# Patient Record
Sex: Male | Born: 1950 | Race: White | Hispanic: No | Marital: Married | Smoking: Never smoker
Health system: Southern US, Community
[De-identification: ages and names within clinical notes are randomized; demographics above are authoritative.]

## PROBLEM LIST (undated history)

## (undated) DIAGNOSIS — G64 Other disorders of peripheral nervous system: Secondary | ICD-10-CM

## (undated) DIAGNOSIS — D72819 Decreased white blood cell count, unspecified: Secondary | ICD-10-CM

## (undated) DIAGNOSIS — M79605 Pain in left leg: Secondary | ICD-10-CM

## (undated) DIAGNOSIS — D751 Secondary polycythemia: Secondary | ICD-10-CM

## (undated) DIAGNOSIS — M5431 Sciatica, right side: Secondary | ICD-10-CM

## (undated) DIAGNOSIS — K210 Gastro-esophageal reflux disease with esophagitis: Secondary | ICD-10-CM

## (undated) DIAGNOSIS — G5793 Unspecified mononeuropathy of bilateral lower limbs: Secondary | ICD-10-CM

## (undated) DIAGNOSIS — Z87442 Personal history of urinary calculi: Secondary | ICD-10-CM

## (undated) DIAGNOSIS — I10 Essential (primary) hypertension: Secondary | ICD-10-CM

## (undated) DIAGNOSIS — B1920 Unspecified viral hepatitis C without hepatic coma: Secondary | ICD-10-CM

## (undated) DIAGNOSIS — M549 Dorsalgia, unspecified: Secondary | ICD-10-CM

## (undated) DIAGNOSIS — M4120 Other idiopathic scoliosis, site unspecified: Secondary | ICD-10-CM

## (undated) DIAGNOSIS — G8929 Other chronic pain: Secondary | ICD-10-CM

## (undated) DIAGNOSIS — G4730 Sleep apnea, unspecified: Secondary | ICD-10-CM

## (undated) DIAGNOSIS — M5136 Other intervertebral disc degeneration, lumbar region: Secondary | ICD-10-CM

## (undated) DIAGNOSIS — M5432 Sciatica, left side: Secondary | ICD-10-CM

## (undated) DIAGNOSIS — M533 Sacrococcygeal disorders, not elsewhere classified: Secondary | ICD-10-CM

## (undated) DIAGNOSIS — L570 Actinic keratosis: Secondary | ICD-10-CM

## (undated) DIAGNOSIS — E299 Testicular dysfunction, unspecified: Secondary | ICD-10-CM

## (undated) DIAGNOSIS — F32A Depression, unspecified: Secondary | ICD-10-CM

## (undated) DIAGNOSIS — K219 Gastro-esophageal reflux disease without esophagitis: Secondary | ICD-10-CM

## (undated) DIAGNOSIS — IMO0002 Reserved for concepts with insufficient information to code with codable children: Secondary | ICD-10-CM

## (undated) DIAGNOSIS — N486 Induration penis plastica: Secondary | ICD-10-CM

## (undated) DIAGNOSIS — N401 Enlarged prostate with lower urinary tract symptoms: Secondary | ICD-10-CM

## (undated) DIAGNOSIS — F419 Anxiety disorder, unspecified: Secondary | ICD-10-CM

## (undated) DIAGNOSIS — K7460 Unspecified cirrhosis of liver: Secondary | ICD-10-CM

## (undated) DIAGNOSIS — M4800 Spinal stenosis, site unspecified: Secondary | ICD-10-CM

## (undated) DIAGNOSIS — N419 Inflammatory disease of prostate, unspecified: Secondary | ICD-10-CM

## (undated) DIAGNOSIS — M79604 Pain in right leg: Secondary | ICD-10-CM

## (undated) DIAGNOSIS — N138 Other obstructive and reflux uropathy: Secondary | ICD-10-CM

## (undated) DIAGNOSIS — M1990 Unspecified osteoarthritis, unspecified site: Secondary | ICD-10-CM

## (undated) DIAGNOSIS — M5030 Other cervical disc degeneration, unspecified cervical region: Secondary | ICD-10-CM

## (undated) DIAGNOSIS — M419 Scoliosis, unspecified: Secondary | ICD-10-CM

## (undated) DIAGNOSIS — Z8619 Personal history of other infectious and parasitic diseases: Secondary | ICD-10-CM

## (undated) HISTORY — PX: ELBOW SURGERY: SHX618

## (undated) HISTORY — DX: Benign prostatic hyperplasia with lower urinary tract symptoms: N40.1

## (undated) HISTORY — DX: Other cervical disc degeneration, unspecified cervical region: M50.30

## (undated) HISTORY — DX: Other chronic pain: G89.29

## (undated) HISTORY — DX: Actinic keratosis: L57.0

## (undated) HISTORY — DX: Gastro-esophageal reflux disease with esophagitis: K21.0

## (undated) HISTORY — DX: Other idiopathic scoliosis, site unspecified: M41.20

## (undated) HISTORY — DX: Unspecified cirrhosis of liver: K74.60

## (undated) HISTORY — DX: Induration penis plastica: N48.6

## (undated) HISTORY — DX: Gastro-esophageal reflux disease without esophagitis: K21.9

## (undated) HISTORY — DX: Spinal stenosis, site unspecified: M48.00

## (undated) HISTORY — DX: Testicular dysfunction, unspecified: E29.9

## (undated) HISTORY — DX: Dorsalgia, unspecified: M54.9

## (undated) HISTORY — DX: Sacrococcygeal disorders, not elsewhere classified: M53.3

## (undated) HISTORY — DX: Inflammatory disease of prostate, unspecified: N41.9

## (undated) HISTORY — DX: Unspecified osteoarthritis, unspecified site: M19.90

## (undated) HISTORY — PX: HERNIA REPAIR: SHX51

## (undated) HISTORY — PX: OTHER SURGICAL HISTORY: SHX169

## (undated) HISTORY — DX: Other obstructive and reflux uropathy: N13.8

## (undated) HISTORY — DX: Unspecified viral hepatitis C without hepatic coma: B19.20

## (undated) HISTORY — DX: Secondary polycythemia: D75.1

## (undated) HISTORY — DX: Essential (primary) hypertension: I10

## (undated) HISTORY — PX: CERVICAL SPINE SURGERY: SHX589

## (undated) HISTORY — DX: Reserved for concepts with insufficient information to code with codable children: IMO0002

## (undated) HISTORY — DX: Other disorders of peripheral nervous system: G64

## (undated) HISTORY — DX: Other intervertebral disc degeneration, lumbar region: M51.36

---
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
## 1968-06-09 HISTORY — PX: FACIAL RECONSTRUCTION SURGERY: SHX631

## 1977-06-09 HISTORY — PX: SHOULDER SURGERY: SHX246

## 1981-06-09 HISTORY — PX: SHOULDER SURGERY: SHX246

## 1982-06-09 HISTORY — PX: FOOT SURGERY: SHX648

## 1987-06-10 HISTORY — PX: CATARACT EXTRACTION: SUR2

## 1997-06-09 HISTORY — PX: SEPTOPLASTY: SUR1290

## 1999-06-10 HISTORY — PX: OTHER SURGICAL HISTORY: SHX169

## 1999-06-10 HISTORY — PX: NISSEN FUNDOPLICATION: SHX2091

## 2000-08-25 ENCOUNTER — Encounter: Payer: Self-pay | Admitting: Neurological Surgery

## 2000-08-25 ENCOUNTER — Observation Stay (HOSPITAL_COMMUNITY): Admission: RE | Admit: 2000-08-25 | Discharge: 2000-08-25 | Payer: Self-pay | Admitting: Neurological Surgery

## 2000-09-14 ENCOUNTER — Encounter: Payer: Self-pay | Admitting: Neurological Surgery

## 2000-09-14 ENCOUNTER — Encounter: Admission: RE | Admit: 2000-09-14 | Discharge: 2000-09-14 | Payer: Self-pay | Admitting: Neurological Surgery

## 2000-11-11 ENCOUNTER — Ambulatory Visit (HOSPITAL_COMMUNITY): Admission: RE | Admit: 2000-11-11 | Discharge: 2000-11-11 | Payer: Self-pay | Admitting: Neurological Surgery

## 2000-11-11 ENCOUNTER — Encounter: Payer: Self-pay | Admitting: Neurological Surgery

## 2002-06-09 HISTORY — PX: SKIN SURGERY: SHX2413

## 2002-12-01 HISTORY — PX: NOSE SURGERY: SHX723

## 2003-01-24 ENCOUNTER — Encounter: Payer: Self-pay | Admitting: Neurological Surgery

## 2003-01-24 ENCOUNTER — Inpatient Hospital Stay (HOSPITAL_COMMUNITY): Admission: RE | Admit: 2003-01-24 | Discharge: 2003-01-26 | Payer: Self-pay | Admitting: Neurological Surgery

## 2004-03-11 ENCOUNTER — Ambulatory Visit: Payer: Self-pay | Admitting: Unknown Physician Specialty

## 2004-03-11 HISTORY — PX: COLONOSCOPY WITH ESOPHAGOGASTRODUODENOSCOPY (EGD): SHX5779

## 2004-03-26 ENCOUNTER — Ambulatory Visit: Payer: Self-pay | Admitting: Pain Medicine

## 2004-04-23 ENCOUNTER — Ambulatory Visit: Payer: Self-pay | Admitting: Pain Medicine

## 2004-05-21 ENCOUNTER — Ambulatory Visit: Payer: Self-pay | Admitting: Pain Medicine

## 2004-05-29 ENCOUNTER — Ambulatory Visit: Payer: Self-pay | Admitting: Pain Medicine

## 2004-08-01 ENCOUNTER — Ambulatory Visit: Payer: Self-pay | Admitting: Pain Medicine

## 2004-08-03 ENCOUNTER — Emergency Department: Payer: Self-pay | Admitting: Emergency Medicine

## 2004-08-04 ENCOUNTER — Emergency Department: Payer: Self-pay | Admitting: Emergency Medicine

## 2004-08-29 ENCOUNTER — Ambulatory Visit: Payer: Self-pay | Admitting: Pain Medicine

## 2004-09-26 ENCOUNTER — Ambulatory Visit: Payer: Self-pay | Admitting: Pain Medicine

## 2004-10-10 ENCOUNTER — Ambulatory Visit: Payer: Self-pay | Admitting: Internal Medicine

## 2004-10-24 ENCOUNTER — Ambulatory Visit: Payer: Self-pay | Admitting: Pain Medicine

## 2004-11-07 ENCOUNTER — Ambulatory Visit: Payer: Self-pay | Admitting: Internal Medicine

## 2004-11-21 ENCOUNTER — Ambulatory Visit: Payer: Self-pay | Admitting: Pain Medicine

## 2004-11-29 ENCOUNTER — Ambulatory Visit: Payer: Self-pay | Admitting: Unknown Physician Specialty

## 2004-12-05 ENCOUNTER — Ambulatory Visit: Payer: Self-pay | Admitting: Internal Medicine

## 2004-12-19 ENCOUNTER — Ambulatory Visit: Payer: Self-pay | Admitting: Pain Medicine

## 2005-01-02 ENCOUNTER — Ambulatory Visit: Payer: Self-pay | Admitting: Gastroenterology

## 2005-01-30 ENCOUNTER — Ambulatory Visit: Payer: Self-pay | Admitting: Gastroenterology

## 2005-02-20 ENCOUNTER — Ambulatory Visit: Payer: Self-pay | Admitting: Pain Medicine

## 2005-02-27 ENCOUNTER — Ambulatory Visit: Payer: Self-pay | Admitting: Gastroenterology

## 2005-03-20 ENCOUNTER — Ambulatory Visit: Payer: Self-pay | Admitting: Pain Medicine

## 2005-04-03 ENCOUNTER — Ambulatory Visit: Payer: Self-pay | Admitting: Gastroenterology

## 2005-04-17 ENCOUNTER — Ambulatory Visit: Payer: Self-pay | Admitting: Obstetrics and Gynecology

## 2005-04-24 ENCOUNTER — Ambulatory Visit: Payer: Self-pay | Admitting: Pain Medicine

## 2005-05-08 ENCOUNTER — Ambulatory Visit: Payer: Self-pay | Admitting: Gastroenterology

## 2005-05-29 ENCOUNTER — Ambulatory Visit: Payer: Self-pay | Admitting: Pain Medicine

## 2005-05-29 ENCOUNTER — Ambulatory Visit: Payer: Self-pay | Admitting: Gastroenterology

## 2005-07-17 ENCOUNTER — Ambulatory Visit: Payer: Self-pay | Admitting: Gastroenterology

## 2005-08-07 ENCOUNTER — Ambulatory Visit: Payer: Self-pay | Admitting: Gastroenterology

## 2005-09-18 ENCOUNTER — Ambulatory Visit: Payer: Self-pay | Admitting: Gastroenterology

## 2005-11-20 ENCOUNTER — Ambulatory Visit: Payer: Self-pay | Admitting: Gastroenterology

## 2005-12-03 ENCOUNTER — Ambulatory Visit: Payer: Self-pay | Admitting: Pain Medicine

## 2006-01-01 ENCOUNTER — Ambulatory Visit: Payer: Self-pay | Admitting: Pain Medicine

## 2006-01-27 ENCOUNTER — Ambulatory Visit: Payer: Self-pay | Admitting: Pain Medicine

## 2006-02-23 ENCOUNTER — Ambulatory Visit: Payer: Self-pay | Admitting: Pain Medicine

## 2006-03-31 ENCOUNTER — Ambulatory Visit: Payer: Self-pay | Admitting: Pain Medicine

## 2006-05-06 ENCOUNTER — Ambulatory Visit (HOSPITAL_COMMUNITY): Admission: RE | Admit: 2006-05-06 | Discharge: 2006-05-06 | Payer: Self-pay | Admitting: Gastroenterology

## 2006-05-07 ENCOUNTER — Ambulatory Visit: Payer: Self-pay | Admitting: Pain Medicine

## 2006-06-05 ENCOUNTER — Ambulatory Visit: Payer: Self-pay | Admitting: Psychiatry

## 2006-06-25 ENCOUNTER — Ambulatory Visit: Payer: Self-pay | Admitting: Pain Medicine

## 2006-07-23 ENCOUNTER — Ambulatory Visit: Payer: Self-pay | Admitting: Pain Medicine

## 2006-07-30 ENCOUNTER — Ambulatory Visit: Payer: Self-pay | Admitting: Gastroenterology

## 2006-08-20 ENCOUNTER — Ambulatory Visit: Payer: Self-pay | Admitting: Pain Medicine

## 2006-08-24 ENCOUNTER — Ambulatory Visit (HOSPITAL_COMMUNITY): Admission: RE | Admit: 2006-08-24 | Discharge: 2006-08-24 | Payer: Self-pay | Admitting: Gastroenterology

## 2006-09-03 ENCOUNTER — Ambulatory Visit: Payer: Self-pay | Admitting: Gastroenterology

## 2006-09-22 ENCOUNTER — Ambulatory Visit: Payer: Self-pay | Admitting: Pain Medicine

## 2006-10-29 ENCOUNTER — Ambulatory Visit: Payer: Self-pay | Admitting: Pain Medicine

## 2006-11-12 ENCOUNTER — Ambulatory Visit: Payer: Self-pay | Admitting: Gastroenterology

## 2006-11-19 ENCOUNTER — Ambulatory Visit: Payer: Self-pay | Admitting: Pain Medicine

## 2006-12-22 ENCOUNTER — Ambulatory Visit: Payer: Self-pay | Admitting: Pain Medicine

## 2007-01-19 ENCOUNTER — Ambulatory Visit: Payer: Self-pay | Admitting: Pain Medicine

## 2007-02-10 ENCOUNTER — Ambulatory Visit: Payer: Self-pay | Admitting: Pain Medicine

## 2007-03-23 ENCOUNTER — Ambulatory Visit: Payer: Self-pay | Admitting: Pain Medicine

## 2007-04-22 ENCOUNTER — Ambulatory Visit: Payer: Self-pay | Admitting: Pain Medicine

## 2007-05-27 ENCOUNTER — Ambulatory Visit: Payer: Self-pay | Admitting: Pain Medicine

## 2007-05-27 ENCOUNTER — Ambulatory Visit: Payer: Self-pay | Admitting: Gastroenterology

## 2007-06-29 ENCOUNTER — Ambulatory Visit: Payer: Self-pay | Admitting: Pain Medicine

## 2007-07-27 ENCOUNTER — Ambulatory Visit: Payer: Self-pay | Admitting: Pain Medicine

## 2007-08-26 ENCOUNTER — Ambulatory Visit: Payer: Self-pay | Admitting: Pain Medicine

## 2007-09-09 ENCOUNTER — Ambulatory Visit: Payer: Self-pay | Admitting: Pain Medicine

## 2007-10-14 ENCOUNTER — Ambulatory Visit: Payer: Self-pay | Admitting: Pain Medicine

## 2007-11-09 ENCOUNTER — Ambulatory Visit: Payer: Self-pay | Admitting: Pain Medicine

## 2007-12-07 ENCOUNTER — Ambulatory Visit: Payer: Self-pay | Admitting: Pain Medicine

## 2008-01-03 ENCOUNTER — Ambulatory Visit (HOSPITAL_COMMUNITY): Admission: RE | Admit: 2008-01-03 | Discharge: 2008-01-03 | Payer: Self-pay | Admitting: Gastroenterology

## 2008-01-06 ENCOUNTER — Ambulatory Visit: Payer: Self-pay | Admitting: Pain Medicine

## 2008-01-06 ENCOUNTER — Ambulatory Visit: Payer: Self-pay | Admitting: Gastroenterology

## 2008-02-08 ENCOUNTER — Ambulatory Visit: Payer: Self-pay | Admitting: Pain Medicine

## 2008-02-23 ENCOUNTER — Ambulatory Visit: Payer: Self-pay | Admitting: Gastroenterology

## 2008-03-07 ENCOUNTER — Ambulatory Visit: Payer: Self-pay | Admitting: Pain Medicine

## 2008-04-06 ENCOUNTER — Ambulatory Visit: Payer: Self-pay | Admitting: Pain Medicine

## 2008-05-09 ENCOUNTER — Ambulatory Visit: Payer: Self-pay | Admitting: Pain Medicine

## 2008-06-09 ENCOUNTER — Ambulatory Visit: Payer: Self-pay | Admitting: Internal Medicine

## 2008-06-13 ENCOUNTER — Ambulatory Visit: Payer: Self-pay | Admitting: Pain Medicine

## 2008-07-06 ENCOUNTER — Ambulatory Visit: Payer: Self-pay | Admitting: Pain Medicine

## 2008-07-10 ENCOUNTER — Ambulatory Visit: Payer: Self-pay | Admitting: Internal Medicine

## 2008-08-01 ENCOUNTER — Ambulatory Visit: Payer: Self-pay | Admitting: Internal Medicine

## 2008-08-01 ENCOUNTER — Ambulatory Visit: Payer: Self-pay | Admitting: Pain Medicine

## 2008-08-07 ENCOUNTER — Ambulatory Visit: Payer: Self-pay | Admitting: Internal Medicine

## 2008-09-05 ENCOUNTER — Ambulatory Visit: Payer: Self-pay | Admitting: Pain Medicine

## 2008-09-07 ENCOUNTER — Ambulatory Visit: Payer: Self-pay | Admitting: Internal Medicine

## 2008-10-05 ENCOUNTER — Ambulatory Visit: Payer: Self-pay | Admitting: Pain Medicine

## 2008-10-07 ENCOUNTER — Ambulatory Visit: Payer: Self-pay | Admitting: Internal Medicine

## 2008-11-02 ENCOUNTER — Ambulatory Visit: Payer: Self-pay | Admitting: Pain Medicine

## 2008-11-07 ENCOUNTER — Ambulatory Visit: Payer: Self-pay | Admitting: Internal Medicine

## 2008-11-09 ENCOUNTER — Ambulatory Visit: Payer: Self-pay | Admitting: Pain Medicine

## 2008-11-15 ENCOUNTER — Ambulatory Visit (HOSPITAL_COMMUNITY): Admission: RE | Admit: 2008-11-15 | Discharge: 2008-11-15 | Payer: Self-pay | Admitting: Gastroenterology

## 2008-11-28 ENCOUNTER — Ambulatory Visit: Payer: Self-pay | Admitting: Pain Medicine

## 2008-12-05 ENCOUNTER — Ambulatory Visit: Payer: Self-pay | Admitting: Gastroenterology

## 2008-12-07 ENCOUNTER — Ambulatory Visit: Payer: Self-pay | Admitting: Internal Medicine

## 2008-12-26 ENCOUNTER — Ambulatory Visit: Payer: Self-pay | Admitting: Pain Medicine

## 2009-01-30 ENCOUNTER — Ambulatory Visit: Payer: Self-pay | Admitting: Pain Medicine

## 2009-01-30 ENCOUNTER — Ambulatory Visit: Payer: Self-pay | Admitting: Internal Medicine

## 2009-02-07 ENCOUNTER — Ambulatory Visit: Payer: Self-pay | Admitting: Internal Medicine

## 2009-03-01 ENCOUNTER — Ambulatory Visit: Payer: Self-pay | Admitting: Pain Medicine

## 2009-03-28 ENCOUNTER — Ambulatory Visit: Payer: Self-pay | Admitting: Pain Medicine

## 2009-04-26 ENCOUNTER — Ambulatory Visit: Payer: Self-pay | Admitting: Pain Medicine

## 2009-05-15 ENCOUNTER — Ambulatory Visit: Payer: Self-pay | Admitting: Internal Medicine

## 2009-05-29 ENCOUNTER — Ambulatory Visit: Payer: Self-pay | Admitting: Pain Medicine

## 2009-06-09 ENCOUNTER — Ambulatory Visit: Payer: Self-pay | Admitting: Internal Medicine

## 2009-06-28 ENCOUNTER — Ambulatory Visit: Payer: Self-pay | Admitting: Pain Medicine

## 2009-07-10 ENCOUNTER — Ambulatory Visit: Payer: Self-pay | Admitting: Internal Medicine

## 2009-07-16 ENCOUNTER — Ambulatory Visit: Payer: Self-pay | Admitting: Internal Medicine

## 2009-07-26 ENCOUNTER — Ambulatory Visit: Payer: Self-pay | Admitting: Pain Medicine

## 2009-08-07 ENCOUNTER — Ambulatory Visit: Payer: Self-pay | Admitting: Internal Medicine

## 2009-08-23 ENCOUNTER — Ambulatory Visit: Payer: Self-pay | Admitting: Pain Medicine

## 2009-09-20 ENCOUNTER — Ambulatory Visit: Payer: Self-pay | Admitting: Pain Medicine

## 2009-10-18 ENCOUNTER — Ambulatory Visit: Payer: Self-pay | Admitting: Pain Medicine

## 2009-11-20 ENCOUNTER — Ambulatory Visit: Payer: Self-pay | Admitting: Pain Medicine

## 2009-11-22 ENCOUNTER — Ambulatory Visit (HOSPITAL_COMMUNITY): Admission: RE | Admit: 2009-11-22 | Discharge: 2009-11-22 | Payer: Self-pay | Admitting: Gastroenterology

## 2009-12-20 ENCOUNTER — Ambulatory Visit: Payer: Self-pay | Admitting: Pain Medicine

## 2010-01-17 ENCOUNTER — Ambulatory Visit: Payer: Self-pay | Admitting: Pain Medicine

## 2010-02-18 ENCOUNTER — Ambulatory Visit: Payer: Self-pay | Admitting: Unknown Physician Specialty

## 2010-02-19 ENCOUNTER — Other Ambulatory Visit: Payer: Self-pay | Admitting: Unknown Physician Specialty

## 2010-02-19 ENCOUNTER — Ambulatory Visit: Payer: Self-pay | Admitting: Pain Medicine

## 2010-02-20 ENCOUNTER — Ambulatory Visit: Payer: Self-pay | Admitting: Unknown Physician Specialty

## 2010-03-04 ENCOUNTER — Ambulatory Visit: Payer: Self-pay | Admitting: Pain Medicine

## 2010-03-09 ENCOUNTER — Ambulatory Visit: Payer: Self-pay | Admitting: Internal Medicine

## 2010-03-25 ENCOUNTER — Ambulatory Visit: Payer: Self-pay | Admitting: Internal Medicine

## 2010-04-09 ENCOUNTER — Ambulatory Visit: Payer: Self-pay | Admitting: Internal Medicine

## 2010-04-16 ENCOUNTER — Ambulatory Visit: Payer: Self-pay | Admitting: Pain Medicine

## 2010-05-16 ENCOUNTER — Ambulatory Visit: Payer: Self-pay | Admitting: Pain Medicine

## 2010-06-18 ENCOUNTER — Ambulatory Visit: Payer: Self-pay | Admitting: Pain Medicine

## 2010-07-16 ENCOUNTER — Ambulatory Visit: Payer: Self-pay | Admitting: Pain Medicine

## 2010-07-22 ENCOUNTER — Ambulatory Visit: Payer: Self-pay | Admitting: Unknown Physician Specialty

## 2010-07-22 HISTORY — PX: COLONOSCOPY WITH ESOPHAGOGASTRODUODENOSCOPY (EGD): SHX5779

## 2010-08-15 ENCOUNTER — Ambulatory Visit: Payer: Self-pay | Admitting: Pain Medicine

## 2010-08-26 ENCOUNTER — Ambulatory Visit: Payer: Self-pay | Admitting: Unknown Physician Specialty

## 2010-09-12 ENCOUNTER — Ambulatory Visit: Payer: Self-pay | Admitting: Pain Medicine

## 2010-09-16 LAB — CREATININE, SERUM
Creatinine, Ser: 1.14 mg/dL (ref 0.4–1.5)
GFR calc non Af Amer: >60 mL/min (ref >60)

## 2010-09-16 LAB — BUN: BUN: 15 mg/dL (ref 6–23)

## 2010-10-15 ENCOUNTER — Ambulatory Visit: Payer: Self-pay | Admitting: Pain Medicine

## 2010-10-25 NOTE — Discharge Summary (Signed)
   [name], [name]                       ACCOUNT NO.:  [id number]   MEDICAL RECORD NO.:  [id number]                   PATIENT TYPE:  INP   LOCATION:  3009                                 FACILITY:  [facility]   PHYSICIAN:  Stefani Dama, M.D.               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   CONDITION ON DISCHARGE:  Improving.   MAJOR OPERATION:  Anterior cervical diskectomy with decompression,  stabilization C4-5, C5-6 structural Allograft.   HOSPITAL COURSE:  The patient is a 60 year old individual who has had  significant neck, shoulder, and arm pain with tingling of both fingertips.  He has significant spondylitic disease plus a herniated nucleus pulposis at  C5-6 on the right side.  He was advised regarding surgical decompression of  C4-5 and C5-6 and this was undertaken on January 24, 2003.  He tolerated the  procedure well.  However, the first postoperative day he had fever to 101.1.  It was uncertain etiology.  The patient complained of significant nausea and  had significant muscle spasm.  This seemed to respond well to the use of  Valium as a muscle relaxer.  His fevers abated with IV hydration.  The  following hospital day the patient felt significantly improved.  He does  have a history of hepatitis C and is not able to take Tylenol amongst other  medications and it is suspected he may have had a mild reaction to some of  the anesthetics.  In any event, he is doing better and is discharged home.  Prescription for oxycodone 5 mg #60 without refills and Valium 5 mg #40  without refills.                                               Stefani Dama, M.D.    [name]  D:  01/26/2003  T:  01/26/2003  Job:  [id number]

## 2010-10-25 NOTE — Op Note (Signed)
NAMECATHERINE, [name]                       ACCOUNT NO.:  [id number]   MEDICAL RECORD NO.:  [id number]                   PATIENT TYPE:  OIB   LOCATION:  3172                                 FACILITY:  [facility]   PHYSICIAN:  Stefani Dama, M.D.               DATE OF BIRTH:  26-Oct-1950   DATE OF PROCEDURE:  01/24/2003  DATE OF DISCHARGE:                                 OPERATIVE REPORT   PREOPERATIVE DIAGNOSIS:  Herniated nucleus pulposus and spondylosis, C4-5,  C5-6 with cervical radiculopathy.   POSTOPERATIVE DIAGNOSIS:  Herniated nucleus pulposus and spondylosis, C4-5,  C5-6 with cervical radiculopathy.   OPERATION PERFORMED:  Anterior cervical diskectomy and decompression with  arthrodesis, structural allograft C4-5 and C5-6  Synthes plate fixation.   SURGEON:  Stefani Dama, M.D.   ASSISTANT:   ANESTHESIA:  General endotracheal.   INDICATIONS FOR PROCEDURE:  The patient is a 60 year old individual who has  had a previous anterior cervical decompression and arthrodesis at C6-C7 two  years ago. He has developed further problems in his neck, shoulder and  particularly in his right arm and there is a presence of herniated nucleus  pulposus at C5-6 on the right side in addition to significant spondylosis at  that level and at C4-C5 with biforaminal stenosis.  After careful  consideration of his options, he was advised regarding anterior  decompression surgery and stabilization.  This previously placed plate will  be removed.   DESCRIPTION OF PROCEDURE:  The patient was brought to the operating room and  placed on the table in supine position.  After smooth induction of general  endotracheal anesthesia, he was placed in five pounds of halter traction.  The neck was shaved, prepped with DuraPrep and draped in sterile fashion.  A  transverse incision was made in the left side of the neck and this was  carried down through the platysma.  The plane between the  sternocleidomastoid and the strap muscles was dissected bluntly until the  prevertebral space was reached.  The first identifiable disk space was noted  to be that of the old plate and with that, the fascia overlying the old  plate was removed and locking screws at C6-C7 levels were removed.  The 14  mm screws that were holding the plate in place were then removed and the  plate was removed.  The area was inspected carefully and the arthrodeses  were noted to be solid at the C6-C7 level.  Attention was then turned to the  level immediately above the arthrodesis at C5-6 and then a diskectomy was  carried out at that level.  A significant osteophyte from the inferior  margin of the body of C6 was encountered and there was a large osteophyte on  the right side in the foramen.  The area was opened by removing the  osteophyte in an en bloc fashion and decompressing the right C7 nerve root.  This was completed and hemostasis was achieved in the epidural space.  C4-C5  similar procedure was carried out, again with large osteophyte being  encountered this area on both sides.  In the end, the end plates were trued  up appropriately and they were sized for a 7 mm lordotic graft.  The grafts  were packed with osteophytic  material that had been prepared, removing all  the fascial tissue and placing the bone into the interspace.  Hemostasis was  then checked carefully and then a 40 mm standard sized Synthes plate was  contoured to the prevertebral space and fixed with six locking 4 x 16 mm  screws. The area was copiously irrigated with antibiotic irrigating  solution.  Hemostasis was achieved in the  epidural space, carefully and then the platysma was closed with 3-0 Vicryl  in the subcuticular tissues and 3-0 Vicryl was used to close the  subcuticular skin.  Dermabond was placed on the skin.  The patient tolerated  the procedure well.                                                Stefani Dama,  M.D.    Merla Riches  D:  01/24/2003  T:  01/24/2003  Job:  [id number]

## 2010-10-25 NOTE — Op Note (Signed)
[place]  Patient:    [name], [name]                      MRN: [id number] Proc. Date: 08/25/00 Adm. Date:  [id number] Attending:  Jonne Ply                           Operative Report  PREOPERATIVE DIAGNOSIS:  C6-7 herniated nucleus pulposus and spondylosis with left cervical radiculopathy.  POSTOPERATIVE DIAGNOSIS:  C6-7 herniated nucleus pulposus and spondylosis with left cervical radiculopathy.  PROCEDURE:  Anterior cervical diskectomy and arthrodesis, C6-7, structural allograft, Synthes fixation.  SURGEON:  Stefani Dama, M.D.  FIRST ASSISTANT:  Tanya Nones. Jeral Fruit, M.D.  ANESTHESIA:  General endotracheal.  INDICATIONS:  The patient is a 61 year old individual who had had significant back, shoulder, and left arm pain with weakness in the triceps and atrophy in that muscle.  He was advised regarding surgical decompression of the C6-7 level.  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  He is taken to the operating room.  DESCRIPTION OF PROCEDURE:  The patient was brought to the operating room and placed on the table in supine position.  After a smooth induction of general endotracheal anesthesia, he was placed in five pounds of Holter traction, the neck was shaved, prepped with Duraprep, and draped in a sterile fashion. Transverse incision was made in the neck and carried down through the platysma.  The plane between the sternocleidomastoid and the strap muscles was dissected bluntly until the prevertebral space was reached.  The first identifiable disk space was noted to be that of C6-7 on the radiograph. Self-retaining Caspar retractor was placed into the wound under the longus colli muscle on either side, and then a diskectomy was performed, removing the anterior longitudinal ligament and a significant quantity of markedly degenerated disk material.  As the  posterior longitudinal ligament was uncovered, there was noted to be significant osteophytes in the inferior margin of the body of C6 and the lateral recesses and the foramen.  The foramina were then opened by removing the uncinate processes as a singular spur on either side using an Anspach drill and a 2.3 mm dissecting tool to remove the uncinate spur.  Once the lateral recesses were cleared, a fragment of disk was found that was markedly sclerotic and attached to the ventral aspect of the dura on the left side of C6-7.  Once this was cleared, hemostasis from the epidural veins in this region was obtained with the bipolar cautery and some Gelfoam-soaked thrombin pledgets that were later removed.  A 7 mm round fibular graft was packed, and some of the morcellized bone from the uncinate processes was placed into the interspace, and traction was removed.  The neck was placed in slight flexion.  An 18 mm plate of standard Synthes size was affixed with four locking 4 x 14 mm screws.  Locking screws were applied.  The incision was irrigated copiously with antibiotic irrigating solution, and then the platysma was closed with 2-0 Vicryl, 3-0 Vicryl interrupted fashion, 3-0 Vicryl in the subcuticular tissues was used to close the skin, and a clear plastic dressing was placed on the skin.  The patient tolerated the procedure well and was taken to the recovery room in stable condition. DD:  08/25/00 TD:  08/25/00 Job: [id number]

## 2010-11-14 ENCOUNTER — Ambulatory Visit: Payer: Self-pay | Admitting: Pain Medicine

## 2010-12-10 ENCOUNTER — Ambulatory Visit: Payer: Self-pay | Admitting: Pain Medicine

## 2011-01-02 ENCOUNTER — Ambulatory Visit: Payer: Self-pay | Admitting: Pain Medicine

## 2011-02-13 ENCOUNTER — Ambulatory Visit: Payer: Self-pay | Admitting: Pain Medicine

## 2011-03-07 LAB — CREATININE, SERUM: GFR calc Af Amer: >60

## 2011-03-13 ENCOUNTER — Ambulatory Visit: Payer: Self-pay | Admitting: Pain Medicine

## 2011-03-27 ENCOUNTER — Ambulatory Visit: Payer: Self-pay | Admitting: Unknown Physician Specialty

## 2011-04-10 ENCOUNTER — Ambulatory Visit: Payer: Self-pay | Admitting: Pain Medicine

## 2011-05-13 ENCOUNTER — Ambulatory Visit: Payer: Self-pay | Admitting: Pain Medicine

## 2011-06-12 ENCOUNTER — Ambulatory Visit: Payer: Self-pay | Admitting: Pain Medicine

## 2011-06-23 ENCOUNTER — Ambulatory Visit: Payer: Self-pay | Admitting: Pain Medicine

## 2011-07-02 ENCOUNTER — Other Ambulatory Visit: Payer: Self-pay | Admitting: Pain Medicine

## 2011-07-02 DIAGNOSIS — IMO0002 Reserved for concepts with insufficient information to code with codable children: Secondary | ICD-10-CM

## 2011-07-02 DIAGNOSIS — M545 Low back pain, unspecified: Secondary | ICD-10-CM

## 2011-07-06 ENCOUNTER — Ambulatory Visit
Admission: RE | Admit: 2011-07-06 | Discharge: 2011-07-06 | Disposition: A | Discharge status: Home / Self Care | Payer: BC Managed Care – PPO | Source: Ambulatory Visit | Attending: Pain Medicine | Admitting: Pain Medicine

## 2011-07-06 DIAGNOSIS — IMO0002 Reserved for concepts with insufficient information to code with codable children: Secondary | ICD-10-CM

## 2011-07-06 DIAGNOSIS — M545 Low back pain: Secondary | ICD-10-CM

## 2011-07-08 ENCOUNTER — Ambulatory Visit: Payer: Self-pay | Admitting: Pain Medicine

## 2011-07-14 ENCOUNTER — Ambulatory Visit: Payer: Self-pay | Admitting: Pain Medicine

## 2011-08-04 ENCOUNTER — Ambulatory Visit: Payer: Self-pay | Admitting: Pain Medicine

## 2011-09-04 ENCOUNTER — Ambulatory Visit: Payer: Self-pay | Admitting: Pain Medicine

## 2011-09-17 ENCOUNTER — Ambulatory Visit: Payer: Self-pay | Admitting: Pain Medicine

## 2011-10-02 ENCOUNTER — Ambulatory Visit: Payer: Self-pay | Admitting: Pain Medicine

## 2011-10-30 ENCOUNTER — Ambulatory Visit: Payer: Self-pay | Admitting: Pain Medicine

## 2011-11-04 DIAGNOSIS — E299 Testicular dysfunction, unspecified: Secondary | ICD-10-CM

## 2011-11-04 HISTORY — DX: Testicular dysfunction, unspecified: E29.9

## 2011-12-02 ENCOUNTER — Ambulatory Visit: Payer: Self-pay | Admitting: Pain Medicine

## 2011-12-10 DIAGNOSIS — R7303 Prediabetes: Secondary | ICD-10-CM | POA: Insufficient documentation

## 2011-12-10 DIAGNOSIS — R7309 Other abnormal glucose: Secondary | ICD-10-CM | POA: Insufficient documentation

## 2012-01-06 ENCOUNTER — Ambulatory Visit: Payer: Self-pay | Admitting: Pain Medicine

## 2012-02-05 ENCOUNTER — Ambulatory Visit: Payer: Self-pay | Admitting: Pain Medicine

## 2012-02-08 HISTORY — PX: ELBOW ARTHROSCOPY: SUR87

## 2012-02-18 ENCOUNTER — Ambulatory Visit: Payer: Self-pay | Admitting: Internal Medicine

## 2012-02-18 LAB — URINALYSIS, COMPLETE
Ketone: NEGATIVE
Renal Epithelial: NONE SEEN
Squamous Epithelial: NONE SEEN
Transitional Epi: NONE SEEN

## 2012-02-20 ENCOUNTER — Inpatient Hospital Stay: Payer: Self-pay | Admitting: Internal Medicine

## 2012-02-20 LAB — URINALYSIS, COMPLETE
Blood: NEGATIVE
Glucose,UR: NEGATIVE mg/dL (ref 0–75)
Nitrite: NEGATIVE
Ph: 6 (ref 4.5–8.0)
RBC,UR: 2 /HPF (ref 0–5)

## 2012-02-20 LAB — CBC
HGB: 16.7 g/dL (ref 13.0–18.0)
MCH: 31.3 pg (ref 26.0–34.0)
RBC: 5.34 10*6/uL (ref 4.40–5.90)

## 2012-02-20 LAB — COMPREHENSIVE METABOLIC PANEL
Albumin: 3.7 g/dL (ref 3.4–5.0)
Alkaline Phosphatase: 58 U/L (ref 50–136)
Calcium, Total: 9.2 mg/dL (ref 8.5–10.1)
Co2: 24 mmol/L (ref 21–32)
EGFR (Non-African Amer.): >60
Glucose: 136 mg/dL — ABNORMAL HIGH (ref 65–99)
Osmolality: 271 (ref 275–301)
Potassium: 3.9 mmol/L (ref 3.5–5.1)
SGPT (ALT): 60 U/L (ref 12–78)
Sodium: 134 mmol/L — ABNORMAL LOW (ref 136–145)

## 2012-02-20 LAB — URINE CULTURE

## 2012-02-21 LAB — CBC WITH DIFFERENTIAL/PLATELET
Basophil #: 0 10*3/uL (ref 0.0–0.1)
Eosinophil #: 0 10*3/uL (ref 0.0–0.7)
HCT: 45.4 % (ref 40.0–52.0)
Lymphocyte #: 0.7 10*3/uL — ABNORMAL LOW (ref 1.0–3.6)
MCHC: 33.4 g/dL (ref 32.0–36.0)
MCV: 95 fL (ref 80–100)
Monocyte #: 0.9 x10 3/mm (ref 0.2–1.0)
Neutrophil #: 4.3 10*3/uL (ref 1.4–6.5)
RDW: 13 % (ref 11.5–14.5)

## 2012-02-21 LAB — BASIC METABOLIC PANEL
Anion Gap: 6 — ABNORMAL LOW (ref 7–16)
Calcium, Total: 8 mg/dL — ABNORMAL LOW (ref 8.5–10.1)
Chloride: 102 mmol/L (ref 98–107)
Co2: 27 mmol/L (ref 21–32)
Osmolality: 270 (ref 275–301)

## 2012-02-21 LAB — SEDIMENTATION RATE: Erythrocyte Sed Rate: 12 mm/hr (ref 0–20)

## 2012-02-22 LAB — CBC WITH DIFFERENTIAL/PLATELET
Basophil #: 0 10*3/uL (ref 0.0–0.1)
Eosinophil #: 0.1 10*3/uL (ref 0.0–0.7)
HCT: 45.5 % (ref 40.0–52.0)
Lymphocyte %: 19.6 %
Monocyte %: 17.6 %
Neutrophil %: 60.5 %
Platelet: 127 10*3/uL — ABNORMAL LOW (ref 150–440)
RBC: 4.77 10*6/uL (ref 4.40–5.90)
RDW: 12.8 % (ref 11.5–14.5)

## 2012-02-22 LAB — COMPREHENSIVE METABOLIC PANEL
Albumin: 3.1 g/dL — ABNORMAL LOW (ref 3.4–5.0)
Anion Gap: 4 — ABNORMAL LOW (ref 7–16)
BUN: 9 mg/dL (ref 7–18)
Glucose: 90 mg/dL (ref 65–99)
SGOT(AST): 80 U/L — ABNORMAL HIGH (ref 15–37)
Total Protein: 6.9 g/dL (ref 6.4–8.2)

## 2012-02-25 LAB — CULTURE, BLOOD (SINGLE)

## 2012-03-01 ENCOUNTER — Ambulatory Visit: Payer: Self-pay | Admitting: Pain Medicine

## 2012-04-06 ENCOUNTER — Ambulatory Visit: Payer: Self-pay | Admitting: Pain Medicine

## 2012-05-03 ENCOUNTER — Ambulatory Visit: Payer: Self-pay | Admitting: Pain Medicine

## 2012-06-08 ENCOUNTER — Ambulatory Visit: Payer: Self-pay | Admitting: Pain Medicine

## 2012-07-06 ENCOUNTER — Ambulatory Visit: Payer: Self-pay | Admitting: Pain Medicine

## 2012-07-27 ENCOUNTER — Ambulatory Visit: Payer: Self-pay | Admitting: Pain Medicine

## 2012-09-02 ENCOUNTER — Ambulatory Visit: Payer: Self-pay | Admitting: Pain Medicine

## 2012-09-15 ENCOUNTER — Ambulatory Visit: Payer: Self-pay | Admitting: Pain Medicine

## 2012-09-30 ENCOUNTER — Ambulatory Visit: Payer: Self-pay | Admitting: Pain Medicine

## 2012-11-02 ENCOUNTER — Ambulatory Visit: Payer: Self-pay | Admitting: Pain Medicine

## 2012-12-02 ENCOUNTER — Ambulatory Visit: Payer: Self-pay | Admitting: Pain Medicine

## 2013-01-04 ENCOUNTER — Ambulatory Visit: Payer: Self-pay | Admitting: Pain Medicine

## 2013-02-01 ENCOUNTER — Ambulatory Visit: Payer: Self-pay | Admitting: Pain Medicine

## 2013-03-01 ENCOUNTER — Ambulatory Visit: Payer: Self-pay | Admitting: Hematology and Oncology

## 2013-03-01 DIAGNOSIS — M4120 Other idiopathic scoliosis, site unspecified: Secondary | ICD-10-CM

## 2013-03-01 HISTORY — DX: Other idiopathic scoliosis, site unspecified: M41.20

## 2013-03-02 ENCOUNTER — Ambulatory Visit: Payer: Self-pay

## 2013-03-03 ENCOUNTER — Ambulatory Visit: Payer: Self-pay | Admitting: Pain Medicine

## 2013-03-04 ENCOUNTER — Ambulatory Visit: Payer: Self-pay | Admitting: Hematology and Oncology

## 2013-03-09 ENCOUNTER — Ambulatory Visit: Payer: Self-pay | Admitting: Hematology and Oncology

## 2013-03-17 ENCOUNTER — Ambulatory Visit: Payer: Self-pay | Admitting: Unknown Physician Specialty

## 2013-03-18 LAB — CBC CANCER CENTER
Basophil %: 0.6 %
Eosinophil #: 0.1 x10 3/mm (ref 0.0–0.7)
Eosinophil %: 1.4 %
HGB: 15.7 g/dL (ref 13.0–18.0)
Lymphocyte %: 29.8 %
MCHC: 33.9 g/dL (ref 32.0–36.0)
Neutrophil %: 55.2 %
RDW: 13.1 % (ref 11.5–14.5)
WBC: 5.4 x10 3/mm (ref 3.8–10.6)

## 2013-03-31 DIAGNOSIS — D751 Secondary polycythemia: Secondary | ICD-10-CM | POA: Insufficient documentation

## 2013-03-31 DIAGNOSIS — N138 Other obstructive and reflux uropathy: Secondary | ICD-10-CM

## 2013-03-31 DIAGNOSIS — N486 Induration penis plastica: Secondary | ICD-10-CM

## 2013-03-31 DIAGNOSIS — R339 Retention of urine, unspecified: Secondary | ICD-10-CM | POA: Insufficient documentation

## 2013-03-31 DIAGNOSIS — N401 Enlarged prostate with lower urinary tract symptoms: Secondary | ICD-10-CM

## 2013-03-31 HISTORY — DX: Other obstructive and reflux uropathy: N13.8

## 2013-03-31 HISTORY — DX: Induration penis plastica: N48.6

## 2013-04-07 ENCOUNTER — Ambulatory Visit: Payer: Self-pay | Admitting: Pain Medicine

## 2013-04-08 DIAGNOSIS — G64 Other disorders of peripheral nervous system: Secondary | ICD-10-CM

## 2013-04-08 HISTORY — DX: Other disorders of peripheral nervous system: G64

## 2013-04-09 ENCOUNTER — Ambulatory Visit: Payer: Self-pay | Admitting: Hematology and Oncology

## 2013-04-09 ENCOUNTER — Ambulatory Visit: Payer: Self-pay | Admitting: Internal Medicine

## 2013-04-12 LAB — CANCER CENTER HEMATOCRIT: HCT: 46.5 % (ref 40.0–52.0)

## 2013-04-14 HISTORY — PX: BACK SURGERY: SHX140

## 2013-05-09 ENCOUNTER — Ambulatory Visit: Payer: Self-pay | Admitting: Internal Medicine

## 2013-05-09 ENCOUNTER — Ambulatory Visit: Payer: Self-pay | Admitting: Hematology and Oncology

## 2013-05-10 ENCOUNTER — Ambulatory Visit: Payer: Self-pay | Admitting: Pain Medicine

## 2013-05-24 LAB — CANCER CENTER HEMATOCRIT: HCT: 46.7 % (ref 40.0–52.0)

## 2013-06-09 ENCOUNTER — Ambulatory Visit: Payer: Self-pay | Admitting: Hematology and Oncology

## 2013-06-09 HISTORY — PX: FINGER SURGERY: SHX640

## 2013-06-14 ENCOUNTER — Ambulatory Visit: Payer: Self-pay | Admitting: Pain Medicine

## 2013-06-14 LAB — CANCER CENTER HEMATOCRIT: HCT: 47.1 % (ref 40.0–52.0)

## 2013-07-05 LAB — CANCER CENTER HEMATOCRIT: HCT: 47.2 % (ref 40.0–52.0)

## 2013-07-10 ENCOUNTER — Ambulatory Visit: Payer: Self-pay | Admitting: Hematology and Oncology

## 2013-07-10 HISTORY — PX: CHOLECYSTECTOMY: SHX55

## 2013-07-14 ENCOUNTER — Ambulatory Visit: Payer: Self-pay | Admitting: Pain Medicine

## 2013-07-19 ENCOUNTER — Ambulatory Visit: Payer: Self-pay | Admitting: Surgery

## 2013-07-26 ENCOUNTER — Ambulatory Visit: Payer: Self-pay | Admitting: Surgery

## 2013-08-02 LAB — PATHOLOGY REPORT

## 2013-08-11 ENCOUNTER — Ambulatory Visit: Payer: Self-pay | Admitting: Pain Medicine

## 2013-08-16 ENCOUNTER — Ambulatory Visit: Payer: Self-pay | Admitting: Internal Medicine

## 2013-08-16 LAB — CBC CANCER CENTER
BASOS ABS: 0.1 x10 3/mm (ref 0.0–0.1)
Basophil %: 0.9 %
EOS ABS: 0.1 x10 3/mm (ref 0.0–0.7)
Eosinophil %: 1.9 %
HCT: 50.9 % (ref 40.0–52.0)
HGB: 16.3 g/dL (ref 13.0–18.0)
LYMPHS ABS: 1.6 x10 3/mm (ref 1.0–3.6)
LYMPHS PCT: 28.7 %
MCH: 28.1 pg (ref 26.0–34.0)
MCHC: 32.1 g/dL (ref 32.0–36.0)
MCV: 88 fL (ref 80–100)
MONO ABS: 0.7 x10 3/mm (ref 0.2–1.0)
MONOS PCT: 12.7 %
NEUTROS ABS: 3.1 x10 3/mm (ref 1.4–6.5)
NEUTROS PCT: 55.8 %
Platelet: 211 x10 3/mm (ref 150–440)
RBC: 5.8 10*6/uL (ref 4.40–5.90)
RDW: 15.3 % — AB (ref 11.5–14.5)
WBC: 5.6 x10 3/mm (ref 3.8–10.6)

## 2013-09-07 ENCOUNTER — Ambulatory Visit: Payer: Self-pay | Admitting: Internal Medicine

## 2013-09-15 ENCOUNTER — Ambulatory Visit: Payer: Self-pay | Admitting: Pain Medicine

## 2013-09-27 LAB — CANCER CENTER HEMATOCRIT: HCT: 45.3 % (ref 40.0–52.0)

## 2013-10-07 ENCOUNTER — Ambulatory Visit: Payer: Self-pay | Admitting: Internal Medicine

## 2013-10-18 ENCOUNTER — Ambulatory Visit: Payer: Self-pay | Admitting: Pain Medicine

## 2013-10-19 DIAGNOSIS — Z79899 Other long term (current) drug therapy: Secondary | ICD-10-CM | POA: Insufficient documentation

## 2013-10-24 ENCOUNTER — Ambulatory Visit: Payer: Self-pay | Admitting: Pain Medicine

## 2013-11-07 ENCOUNTER — Ambulatory Visit: Payer: Self-pay | Admitting: Internal Medicine

## 2013-11-08 LAB — CANCER CENTER HEMATOCRIT: HCT: 48 % (ref 40.0–52.0)

## 2013-11-14 ENCOUNTER — Ambulatory Visit: Payer: Self-pay | Admitting: Pain Medicine

## 2013-12-07 ENCOUNTER — Ambulatory Visit: Payer: Self-pay | Admitting: Internal Medicine

## 2013-12-13 ENCOUNTER — Ambulatory Visit: Payer: Self-pay | Admitting: Pain Medicine

## 2013-12-20 LAB — CANCER CENTER HEMATOCRIT: HCT: 48.5 % (ref 40.0–52.0)

## 2014-01-07 ENCOUNTER — Ambulatory Visit: Payer: Self-pay | Admitting: Internal Medicine

## 2014-01-12 ENCOUNTER — Ambulatory Visit: Payer: Self-pay | Admitting: Pain Medicine

## 2014-01-31 LAB — CANCER CENTER HEMATOCRIT: HCT: 45.6 % (ref 40.0–52.0)

## 2014-02-07 ENCOUNTER — Ambulatory Visit: Payer: Self-pay | Admitting: Internal Medicine

## 2014-02-14 ENCOUNTER — Ambulatory Visit: Payer: Self-pay | Admitting: Pain Medicine

## 2014-03-14 ENCOUNTER — Ambulatory Visit: Payer: Self-pay | Admitting: Internal Medicine

## 2014-03-14 ENCOUNTER — Ambulatory Visit: Payer: Self-pay | Admitting: Pain Medicine

## 2014-03-14 LAB — CBC CANCER CENTER
Basophil #: 0 x10 3/mm (ref 0.0–0.1)
Basophil %: 0.7 %
EOS PCT: 2.5 %
Eosinophil #: 0.2 x10 3/mm (ref 0.0–0.7)
HCT: 50.6 % (ref 40.0–52.0)
HGB: 16.5 g/dL (ref 13.0–18.0)
LYMPHS ABS: 1.8 x10 3/mm (ref 1.0–3.6)
LYMPHS PCT: 29.9 %
MCH: 30.1 pg (ref 26.0–34.0)
MCHC: 32.6 g/dL (ref 32.0–36.0)
MCV: 92 fL (ref 80–100)
MONO ABS: 0.9 x10 3/mm (ref 0.2–1.0)
Monocyte %: 14.8 %
Neutrophil #: 3.2 x10 3/mm (ref 1.4–6.5)
Neutrophil %: 52.1 %
Platelet: 201 x10 3/mm (ref 150–440)
RBC: 5.48 10*6/uL (ref 4.40–5.90)
RDW: 14.3 % (ref 11.5–14.5)
WBC: 6.1 x10 3/mm (ref 3.8–10.6)

## 2014-04-09 ENCOUNTER — Ambulatory Visit: Payer: Self-pay | Admitting: Internal Medicine

## 2014-04-13 ENCOUNTER — Ambulatory Visit: Payer: Self-pay | Admitting: Pain Medicine

## 2014-04-25 ENCOUNTER — Ambulatory Visit: Payer: Self-pay | Admitting: Internal Medicine

## 2014-04-25 LAB — CANCER CENTER HEMATOCRIT: HCT: 51.3 % (ref 40.0–52.0)

## 2014-05-09 ENCOUNTER — Ambulatory Visit: Payer: Self-pay | Admitting: Internal Medicine

## 2014-05-16 ENCOUNTER — Ambulatory Visit: Payer: Self-pay | Admitting: Pain Medicine

## 2014-06-13 ENCOUNTER — Ambulatory Visit: Payer: Self-pay | Admitting: Hematology and Oncology

## 2014-06-13 ENCOUNTER — Ambulatory Visit: Payer: Self-pay | Admitting: Pain Medicine

## 2014-06-13 LAB — CANCER CENTER HEMATOCRIT: HCT: 49 % (ref 40.0–52.0)

## 2014-07-10 ENCOUNTER — Ambulatory Visit: Payer: Self-pay | Admitting: Hematology and Oncology

## 2014-07-11 ENCOUNTER — Ambulatory Visit: Payer: Self-pay | Admitting: Pain Medicine

## 2014-08-08 ENCOUNTER — Ambulatory Visit
Admit: 2014-08-08 | Disposition: A | Discharge status: Home / Self Care | Payer: Self-pay | Attending: Hematology and Oncology | Admitting: Hematology and Oncology

## 2014-08-10 ENCOUNTER — Ambulatory Visit: Payer: Self-pay | Admitting: Pain Medicine

## 2014-09-12 ENCOUNTER — Ambulatory Visit
Admit: 2014-09-12 | Disposition: A | Discharge status: Home / Self Care | Payer: Self-pay | Attending: Pain Medicine | Admitting: Pain Medicine

## 2014-09-26 NOTE — Discharge Summary (Signed)
PATIENT NAME:  [name], [name] MR#:  [id number] DATE OF BIRTH:  [birth year]  DATE OF ADMISSION:  02/20/2012 DATE OF DISCHARGE:  02/24/2012  ADMITTING DIAGNOSES: Fevers, chills, feeling sick with headaches.   DISCHARGE DIAGNOSES:  1. Fevers and chills with headache, symptoms possibly due to tickborne illness  as well as possibly due to urinary tract infection.  2. Headache, possibly tension headache and/or due to possible tickborne illness.  3. SIRS due to #1.  4. Chronic hepatitis C.  5. Chronic pain.  6. History of low testosterone.  7. Gastroesophageal reflux disease.  8. Sinus bradycardia without any symptoms, likely chronic. 9. Elevated blood pressure possibly related to IV fluids and pain.  Will be followed up by Dr. Clayborn Bigness as an outpatient for any further evaluations.  CONSULTANTS: Dr. Clayborn Bigness.   PERTINENT LABORATORY DATA AND EVALUATIONS: Admitting glucose 136, BUN 14, creatinine 1.112, sodium 134, potassium 3.9, chloride 100, CO2 24, calcium 9.2. LFTs were normal except AST of 46. WBC 11.6, hemoglobin 16.7, platelet count 139. His urine cultures from 09/11 showed Serratia. Blood cultures times four no growth.  Urinalysis on 09/13 showed leukocyte esterase 1+, WBCs 49. Ehrlichiosis titers were negative Lyme titers were undetectable. [facility] spotted fever antibodies were undetectable. EKG showed sinus bradycardia. CT scan of the head showed no acute abnormality. Ultrasound of the kidneys showed no hydronephrosis, small cyst in the left kidney.   HOSPITAL COURSE: Please refer to History and Physical done by the admitting physician. The patient is a 64 year old Caucasian male with history of chronic hepatitis C who presented with chills, fevers, as well as headaches. The patient was seen at a walk-in clinic in [facility]. He was treated with Bactrim double strength; however, he did not respond to the antibiotics and continued to have fevers and headaches, so he came to the Emergency  Department.  His urinalysis initially was abnormal suggestive of persistent urinary tract infection. However, urine cultures were negative. The patient despite being on IV ceftriaxone continued to have intermittent fevers. Therefore, he was started on empiric treatment with doxycycline. The patient also had headaches for which he underwent a CT scan of the head. After starting the doxycycline his fevers subsided. He also started feeling much better. He was seen by Dr. Clayborn Bigness of ID who recommended continuing the doxycycline to finish a course even though his cultures for rickettsial disease were negative. At this time, the patient is doing much better and is stable for discharge to home.   DISCHARGE MEDICATIONS:  1. Loratadine 10 daily.  2. Spironolactone 25 daily.  3. Citalopram 40 daily.  4. Flomax 0.4 daily.  5. Lasix 40 daily.  6. Testosterone 0.5 IM every two weeks.  7. Oxycodone 10 mg 1 tab 4 times per day as needed.  8. Methadone 10, 1 tab p.o. t.i.d. 9. Xanax 0.5 daily as needed. 10. Omeprazole 40 daily.  11. Doxycycline 100, 1 tab p.o. daily times five days.   HOME OXYGEN: None.   DIET: Regular.   ACTIVITY: As tolerated.   FOLLOWUP: Follow up with Dr. Clayborn Bigness in 1 to 2 weeks.    TIME SPENT: 35 minutes.  ____________________________ Lafonda Mosses Posey Pronto, MD shp:bjt D: 02/25/2012 13:51:40 ET T: 02/25/2012 14:01:47 ET JOB#: [id number]  cc: Heinz Knuckles. Blocker, MD Alric Seton MD ELECTRONICALLY SIGNED 02/26/2012 21:49

## 2014-09-26 NOTE — H&P (Signed)
PATIENT NAME:  [name], SUHR MR#:  [id number] DATE OF BIRTH:  15-Jun-1950  DATE OF ADMISSION:  02/20/2012  PRIMARY CARE PHYSICIAN: Lanae Boast, MD  REFERRING PHYSICIAN:  Elyn Peers, MD  CHIEF COMPLAINT: Fever, chills, feeling unwell and sick.   HISTORY OF PRESENT ILLNESS: [name] is a 64 year old pleasant Caucasian male who was in his usual state of health until Monday, that is four days ago, when he developed chills and fever associated with dysuria and frequency of urination. Then he went to the walk-in clinic in [place]. He was treated with Bactrim DS. However, over the last two days his condition continued to get worse. He continued to have fever, chills, and headache. The patient became lethargic and decided to come to the emergency department. Evaluation here reveals findings consistent with acute pyelonephritis. The patient was admitted for further evaluation and management.   REVIEW OF SYSTEMS: CONSTITUTIONAL: The patient admits having fever and chills and fatigue. EYES: No blurring of vision. No double vision. ENT: No hearing impairment. No sore throat. No dysphagia. CARDIOVASCULAR: No chest pain. No shortness of breath. No edema. No syncope. RESPIRATORY: No cough. No chest pain. No shortness of breath. GASTROINTESTINAL: No abdominal pain. No vomiting. No diarrhea. No hematochezia. GENITOURINARY: Admits to dysuria and frequency of urination. MUSCULOSKELETAL: He has chronic back pain. No joint swelling. No muscular pain or swelling. INTEGUMENTARY: No skin rash. No ulcers. NEUROLOGY: No focal weakness and no seizure activity but admits to having headache. PSYCHIATRY: He has anxiety and depression. ENDOCRINE: No polyuria or polydipsia other than frequency of urination. No heat or cold intolerance.   PAST MEDICAL HISTORY:  1. Chronic pain syndrome with chronic neck pain and lumbar spine foraminal stenosis, followed up by the pain clinic.  2. Chronic hepatitis C.  3. Gastroesophageal  reflux disease. 4. Depression and anxiety.   PAST SURGICAL HISTORY:  1. Nissen fundoplication. 2. Back surgery.   FAMILY HISTORY: His father died from complications of a heart condition. He was born with a congenital heart problem reporting he had a hole in his heart. His mother died from lung cancer.   SOCIAL HABITS: Nonsmoker. No history of alcohol or drug abuse.   SOCIAL HISTORY: He is retired from working with the [employer]. He is married and living with his wife.   ADMISSION MEDICATIONS:  1. Loratadine 10 mg once a day. 2. Testosterone 100 mg intramuscularly once every two weeks.  3. Bactrim DS started a few days ago, twice a day.  4. Spironolactone 25 mg once a day. 5. Lasix 20 mg daily p.r.n.  6. Methadone 10 mg three times daily.  7. Oxycodone 7.5 mg every 6 hours p.r.n. for breakthrough pain.  8. Omeprazole 40 mg once a day. 9. Celexa (citalopram) 40 mg once a day. 10. Xanax 0.5 mg once a day p.r.n.  11. Flomax 0.4 mg one capsule strangely taking it only as needed.   ALLERGIES: No known drug allergies.   PHYSICAL EXAMINATION:   VITAL SIGNS: Blood pressure 155/98, respiratory rate 20, pulse 88, temperature 101.1, and oxygen saturation 95%.   GENERAL APPEARANCE: Elderly male lying in bed in no acute distress, although he is sick looking.   HEAD AND NECK: No pallor. No icterus. No cyanosis.   ENT: Hearing was normal. Nasal mucosa, lips, and tongue were normal.   EYES: Normal iris and conjunctivae. Pupils are about 5 mm, equal and reactive to light.   NECK: Supple. Trachea at midline. No thyromegaly. No cervical lymphadenopathy. No  masses.   HEART: Normal S1 and S2. No S3 or S4. No murmur. No gallop. No carotid bruits.   RESPIRATORY: Normal breathing pattern without use of accessory muscles. No rales. No wheezing.   ABDOMEN: Soft without tenderness. No hepatosplenomegaly. No masses. No hernias.   SKIN: No ulcers. No subcutaneous nodules. He has multiple  tattoos all over.   MUSCULOSKELETAL: No joint swelling. No clubbing.   NEUROLOGIC: Cranial nerves II through XII are intact. No focal motor deficit.   PSYCHIATRIC: The patient is alert and oriented x3. Mood and affect were normal.   LABORATORY DATA: Serum glucose 136, BUN 14, creatinine 1.1, sodium 134, and potassium 3.9. Liver function tests were normal. Transaminases showed slight elevation of AST at 46, ALT is normal at 60, and bilirubin was 1. CBC showed white count of 11,600, hemoglobin 16, hematocrit 50, and platelet count 139.   Urine culture showed +1 leukocyte esterase and 49 white blood cells. Urine culture taken on 02/18/2012 is growing more than 100,000 of gram-negative. The ID and sensitivity is pending.   ASSESSMENT:  1. Acute pyelonephritis.  2. Chronic hepatitis C.  3. Chronic back pain. 4. Anxiety and depression.   PLAN: We will admit the patient to the medical floor. IV hydration. Blood cultures x2 are taken. Follow up on the results of the urine culture results which should be in the next 12 hours.     Discontinue Bactrim and start Rocephin 1 gram daily. Continue pain management and continue the home medications as listed above.   TIME SPENT: More than 55 minutes. ____________________________ Clovis Pu. Lenore Manner, MD amd:slb D: 02/20/2012 05:52:43 ET T: 02/20/2012 09:00:23 ET JOB#: [id number]  cc: Clovis Pu. Lenore Manner, MD, <Dictator> [place] Blocker, MD Clovis Pu Pasty Manninen MD ELECTRONICALLY SIGNED 02/21/2012 22:29

## 2014-09-26 NOTE — Consult Note (Signed)
PATIENT NAME:  [name], [name] MR#:  [id number] DATE OF BIRTH:  07-15-1950  DATE OF CONSULTATION:  02/23/2012  REFERRING PHYSICIAN:  Dustin Flock, MD CONSULTING PHYSICIAN:  Heinz Knuckles. Ouida Abeyta, MD  REASON FOR CONSULTATION: Fever and headache.   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 He states that initially his symptoms also included difficulty initiating urination, but he was not having significant dysuria. He was urinating more often. He was seen in a walk-in clinic and there was concern over possible prostatitis, although he denies having a prostate exam at that time. He was given Cipro initially but his pharmacist was worried about the effects of Cipro on his methadone so his antibiotic was changed to Bactrim. He had not improved with the Bactrim and continued to have the fevers, chills, and headache. The headache was described as mainly frontal but was incredibly severe. He had not been eating as much but was drinking more than usual to avoid dehydration. He was admitted to the hospital and started on doxycycline and ceftriaxone. His symptoms have improved. Cultures from urgent care on 02/18/2012 grew greater than 100,000 CFUs/mL of Serratia but the urinalysis at that time only had 1+ leukocyte esterase and negative nitrites. There were 0 to 5 red cells and 15 to 30 white cells. His urinalysis on admission had 1+ leukocytosis and negative nitrites with 2 red cells and 49 white cells. His urine culture and blood cultures on admission have been negative. His fevers, chills, and sweats have improved since admission and he is overall feeling better and eating better. His white count was minimally up at 11.6 on admission and has  come down to normal.   ALLERGIES: No known drug allergies.   PAST MEDICAL HISTORY:  1. Chronic hepatitis C infection. He has been on therapy in the past, but did not tolerate it well and his hepatitis C viral load remains positive. He is followed by Dr. Maceo Pro at [facility].  2. Severe back pain related to degenerative joint disease. He is followed in the pain clinic and is on chronic pain medicines.  3. Peripheral neuropathy.  4. Testicular dysfunction, on testosterone supplements.  5. Gastroesophageal reflux disease.  6. Depression.  7. Carpal tunnel syndrome. 8. Colonic polyps.  9. Ventral hernia.  10. Basal cell carcinoma of the nose excised with subsequent reconstruction. 11. Lumbar diskectomy of the cervical and lumbar areas.   FAMILY HISTORY: Positive for lung cancer in his mother, coronary artery disease in his father, heart attack in his father, and coronary artery disease in his sister.   SOCIAL HISTORY: The patient lives with his spouse. He has never been a smoker. He does not drink alcohol. No drug use.  REVIEW OF SYSTEMS: GENERAL: Positive for fevers, chills, sweats, malaise, and fatigue. HEENT: Positive severe headache. No significant sinus congestion. No nasal congestion. No sore throat. NECK: No stiffness. No swollen glands. RESPIRATORY: No cough or shortness of breath and no sputum production. CARDIAC: No chest pains or palpitations. GI: No nausea, no vomiting, no abdominal pain, and no change in his bowels. GENITOURINARY: He had some difficulty starting his urinary stream earlier but this has improved. He has not had significant dysuria. No hematuria. He has some increased frequency. MUSCULOSKELETAL: He has generalized achiness  but no focal joint complaints. He has chronic back pain. NEUROLOGIC: No focal weakness. PSYCHIATRIC: No complaints. SKIN: No rashes. All other systems are negative.   PHYSICAL EXAMINATION:   VITAL SIGNS: T-max 101.9 and T-current 97.8, pulse 45, blood  pressure 146/91, and 95% on room air.   GENERAL: a 64 year old white man in no acute distress.   HEENT: Normocephalic, atraumatic. Pupils equal and reactive to light. Extraocular motion intact. Sclerae, conjunctivae, and lids are without evidence for emboli or petechiae. Oropharynx shows no erythema or exudate. Teeth and gums are in good condition.   NECK: Supple. Full range of motion. Midline trachea. No lymphadenopathy. No thyromegaly.   LUNGS: Clear to auscultation bilaterally with good air movement. No focal consolidation.   HEART: Regular rate and rhythm without murmur, rub, or gallop.   ABDOMEN: Soft, nontender, and nondistended. No hepatosplenomegaly. No hernia is noted.   EXTREMITIES: No evidence for tenosynovitis.   SKIN: No rashes. He has multiple tattoos present. There was no stigmata of endocarditis, specifically no Janeway lesions or Osler nodes.   NEUROLOGIC: The patient is awake and interactive, moving all four extremities.   PSYCHIATRIC: Mood and affect appeared normal.   LABORATORY, DIAGNOSTIC AND RADIOLOGIC DATA: BUN is 9 with a creatinine of 0.97, bicarbonate 30, anion gap 4, AST 80, ALT 96, alkaline phosphatase 47, and total bilirubin 0.3. White count is 4.2 with a hemoglobin of 15.5, platelet count 127, and ANC of 2.6.   Urine and blood cultures are negative, although the urine culture from 02/18/2012 is positive for Serratia.  Urinalysis on admission had 1+ ketones, negative nitrites, 1+ leukocyte esterase, and 2 red cells and 49 white cells per high-power field.   A chest x-ray from admission showed no infiltrates.   A CT scan of the head without contrast showed no acute intracranial process.   Renal ultrasound showed a small cyst on the left kidney, but no evidence for hydronephrosis or obstruction.   IMPRESSION: A 64 year old white man with a past history significant for chronic hepatitis C infection and severe degenerative joint disease of the back admitted  with fever and headache.   RECOMMENDATIONS:  1. His initial evaluation showed mild leukocyte esterase with negative nitrites in the urine but his culture grew Serratia. His admission urinalysis had a few more white cells but negative culture. He has had some difficulty initiating urine stream but not a lot of dysuria. His fever and headaches have been severe, however.  2. I agree with treating for [facility] and Ehrlichia. Lyme is unlikely to cause this constellation of symptoms. Would continue the doxycycline.  3. He was told that he could have prostatitis in urgent care, but he did not feel the same as when he has had prostatitis in the past. He reports the rectal on admission not tender.  4. We will stop the ceftriaxone.  5. Would continue doxycycline for seven days. If he continues to improve, we will change to p.o. tomorrow.   This is a moderately complex infectious disease consult. Thank you very much for involving me in Mr. Waterhouse's care.   ____________________________ Heinz Knuckles. Lina Hitch, MD meb:slb D: 02/23/2012 14:28:29 ET T: 02/23/2012 15:07:38 ET JOB#: [id number]  cc: Heinz Knuckles. Dhyana Bastone, MD, <Dictator> Cyan Clippinger E Cesareo Vickrey MD ELECTRONICALLY SIGNED 02/24/2012 8:35

## 2014-09-26 NOTE — Consult Note (Signed)
Impression: 64yo WM w/ h/o chronic hep C infection, severer DJD of the back admitted with fever and headache.  His initial evaluation showed mild leukocyte esterase with negative nitrites in the urine, but his UCx grew Serratia.  His admission u/a had a few more WBC, but negative culture.  He had some difficulty initiating the urine stream, but not a lot of dysuria.  His fever and headache have been severe.  Agree with treating for RMSF and Ehrlichia.  Lyme is unlikely to cause this constellation of symptoms.  Would continue doxcycyline. He was told that he could have prostatitis, but he does not feel the same as when he had prostatitis in the past.  He reports the rectal exam on admission was not tender. Will stop the ceftriaxone.   Would continue doxycycline for 7 days.  If he continues to improve, will change to po tomorrow.  Electronic Signatures: Oluchi Pucci, Heinz Knuckles (MD)  (Signed on 16-Sep-13 14:20)  Authored  Last Updated: 16-Sep-13 14:20 by Fidencio Duddy, Heinz Knuckles (MD)

## 2014-09-30 NOTE — Op Note (Signed)
PATIENT NAME:  [name], [name] MR#:  [id number] DATE OF BIRTH:  1950-11-26  DATE OF PROCEDURE:  07/26/2013  PREOPERATIVE DIAGNOSIS: Chronic cholecystitis and cholelithiasis.   POSTOPERATIVE DIAGNOSIS: Chronic cholecystitis and cholelithiasis.   OPERATION: Robotic-assisted laparoscopic cholecystectomy and liver biopsy.   ANESTESIA: General.   SURGEON: Jorge Goldmann, MD   OPERATIVE PROCEDURE: With the patient in the supine position after induction of appropriate general anesthesia, the patient's abdomen was prepped with ChloraPrep and draped with sterile towels. The patient was placed in the head down, feet up position. A small infraumbilical incision was made in the standard fashion and carried down bluntly through the subcutaneous tissue. A Veress needle was used to cannulate the peritoneal cavity. CO2 was insufflated to appropriate pressure measurements. When approximately 2.5 L of CO2 were instilled, the Veress needle was withdrawn and an 8.5 mm robotic port inserted into the peritoneal cavity. Intraperitoneal position was confirmed. CO2 was reinsufflated. Two lateral robotic ports were placed under direct vision and the camera moved to the lateral port to be certain there was no problem at the umbilical port. There did not appear to be any evidence of injury. An assistant port was placed in the right upper quadrant under direct vision using the 5 mm port. The robot was then brought to the table and docked to the ports without difficulty. The instruments were inserted under direct vision and directed to the area of the gallbladder.   The gallbladder was retracted superiorly and laterally. Hepatoduodenal ligament was identified and dissection carried out through the ligament. The cystic artery and cystic duct were identified. Window was created behind the cystic duct. The common duct was visualized. The cystic duct was doubly clipped on the common duct side, singly clipped on the gallbladder side  and divided. There was a small lateral artery that coursed up over the lateral aspect of the gallbladder. It was clipped singly and divided. The gallbladder was dissected free from its bed in the liver. During the dissection, a much larger cystic artery was identified in the upper portion of the gallbladder bed. It was doubly clipped and divided. The gallbladder was then removed from its bed in the liver. It was captured in a grasper. The robot was undocked, camera moved to the lateral port and the gallbladder brought out through the umbilical port. Then under direct vision, a Tru-Cut needle biopsy was inserted through a separate stab wound in the right costal margin to achieve needle liver biopsy. Two biopsy specimens were taken. Hemostasis was achieved with manual pressure. The area was copiously suctioned and irrigated. The abdomen was desufflated. All ports were withdrawn without difficulty. The midline fascia was closed with figure-of-eight suture of 0 Vicryl. Skin was closed with 5-0 nylon. The wounds were dressed sterilely and the patient was returned to the recovery room, having tolerated the procedure well. Sponge, instrument and needle counts were correct x 2 in the operating room.   ____________________________ Jorge Goldmann III, MD rle:aw D: 07/26/2013 09:22:45 ET T: 07/26/2013 09:32:51 ET JOB#: [id number]  cc: Micheline Maze, MD, <Dictator> Floria Raveling. Astrid Divine, MD Manya Silvas, MD Jorge Goldmann MD ELECTRONICALLY SIGNED 07/28/2013 12:55

## 2014-10-12 ENCOUNTER — Encounter: Payer: Self-pay | Admitting: Pain Medicine

## 2014-10-14 ENCOUNTER — Other Ambulatory Visit: Payer: Self-pay | Admitting: *Deleted

## 2014-10-14 DIAGNOSIS — D751 Secondary polycythemia: Secondary | ICD-10-CM

## 2014-10-17 ENCOUNTER — Encounter (INDEPENDENT_AMBULATORY_CARE_PROVIDER_SITE_OTHER): Payer: Self-pay

## 2014-10-17 ENCOUNTER — Inpatient Hospital Stay: Payer: Medicare Other | Attending: Internal Medicine

## 2014-10-17 ENCOUNTER — Inpatient Hospital Stay: Payer: Medicare Other

## 2014-10-17 DIAGNOSIS — D751 Secondary polycythemia: Secondary | ICD-10-CM | POA: Diagnosis not present

## 2014-10-17 DIAGNOSIS — Z79899 Other long term (current) drug therapy: Secondary | ICD-10-CM | POA: Diagnosis not present

## 2014-10-17 LAB — CBC
HEMATOCRIT: 49.9 % (ref 40.0–52.0)
HEMOGLOBIN: 16.6 g/dL (ref 13.0–18.0)
MCH: 30.2 pg (ref 26.0–34.0)
MCHC: 33.3 g/dL (ref 32.0–36.0)
MCV: 90.7 fL (ref 80.0–100.0)
Platelets: 198 10*3/uL (ref 150–440)
RBC: 5.5 MIL/uL (ref 4.40–5.90)
RDW: 14.3 % (ref 11.5–14.5)
WBC: 5.3 10*3/uL (ref 3.8–10.6)

## 2014-11-06 ENCOUNTER — Other Ambulatory Visit: Payer: Self-pay | Admitting: Pain Medicine

## 2014-11-06 DIAGNOSIS — Z9889 Other specified postprocedural states: Secondary | ICD-10-CM

## 2014-11-06 DIAGNOSIS — M5136 Other intervertebral disc degeneration, lumbar region: Secondary | ICD-10-CM | POA: Insufficient documentation

## 2014-11-06 DIAGNOSIS — M5030 Other cervical disc degeneration, unspecified cervical region: Secondary | ICD-10-CM

## 2014-11-06 DIAGNOSIS — M51369 Other intervertebral disc degeneration, lumbar region without mention of lumbar back pain or lower extremity pain: Secondary | ICD-10-CM

## 2014-11-06 DIAGNOSIS — M533 Sacrococcygeal disorders, not elsewhere classified: Secondary | ICD-10-CM

## 2014-11-06 DIAGNOSIS — Z981 Arthrodesis status: Secondary | ICD-10-CM

## 2014-11-06 HISTORY — DX: Other cervical disc degeneration, unspecified cervical region: M50.30

## 2014-11-06 HISTORY — DX: Other intervertebral disc degeneration, lumbar region without mention of lumbar back pain or lower extremity pain: M51.369

## 2014-11-06 HISTORY — DX: Sacrococcygeal disorders, not elsewhere classified: M53.3

## 2014-11-06 HISTORY — DX: Other intervertebral disc degeneration, lumbar region: M51.36

## 2014-11-09 ENCOUNTER — Ambulatory Visit: Payer: Medicare Other | Attending: Pain Medicine | Admitting: Pain Medicine

## 2014-11-09 ENCOUNTER — Encounter: Payer: Self-pay | Admitting: Pain Medicine

## 2014-11-09 VITALS — BP 143/98 | HR 56 | Temp 97.7°F | Resp 16 | Ht 69.0 in | Wt 205.0 lb

## 2014-11-09 DIAGNOSIS — M4806 Spinal stenosis, lumbar region: Secondary | ICD-10-CM | POA: Diagnosis not present

## 2014-11-09 DIAGNOSIS — M47816 Spondylosis without myelopathy or radiculopathy, lumbar region: Secondary | ICD-10-CM | POA: Insufficient documentation

## 2014-11-09 DIAGNOSIS — Z9889 Other specified postprocedural states: Secondary | ICD-10-CM

## 2014-11-09 DIAGNOSIS — M533 Sacrococcygeal disorders, not elsewhere classified: Secondary | ICD-10-CM | POA: Diagnosis not present

## 2014-11-09 DIAGNOSIS — M5030 Other cervical disc degeneration, unspecified cervical region: Secondary | ICD-10-CM | POA: Insufficient documentation

## 2014-11-09 DIAGNOSIS — M7700 Medial epicondylitis, unspecified elbow: Secondary | ICD-10-CM | POA: Insufficient documentation

## 2014-11-09 DIAGNOSIS — M62830 Muscle spasm of back: Secondary | ICD-10-CM | POA: Diagnosis not present

## 2014-11-09 DIAGNOSIS — M5136 Other intervertebral disc degeneration, lumbar region: Secondary | ICD-10-CM | POA: Diagnosis not present

## 2014-11-09 DIAGNOSIS — M542 Cervicalgia: Secondary | ICD-10-CM | POA: Diagnosis present

## 2014-11-09 DIAGNOSIS — M79605 Pain in left leg: Secondary | ICD-10-CM | POA: Diagnosis present

## 2014-11-09 DIAGNOSIS — Z981 Arthrodesis status: Secondary | ICD-10-CM

## 2014-11-09 DIAGNOSIS — M546 Pain in thoracic spine: Secondary | ICD-10-CM | POA: Diagnosis present

## 2014-11-09 MED ORDER — OXYCODONE HCL 10 MG PO TABS: ORAL_TABLET | ORAL | Status: DC

## 2014-11-09 NOTE — Progress Notes (Signed)
teachback 3 done. Scripts given. Discharge to home.

## 2014-11-09 NOTE — Progress Notes (Signed)
   Subjective:    Patient ID: [name], male    DOB: September 22, 1950, 64 y.o.   MRN: [id number]  HPI  Patient is 64 year old gentleman [facility] evaluation and treatment of pain involving the neck and entire back upper and lower extremity regions. Patient is undergoing treatment for hepatitis at this time. We'll continue medications as prescribed and avoid interventional treatment as discussed with patient on today's visit. Patient states that he does have lower back pain and lower extremity pain paresthesias with numbness being the major complaint involving the lower extremities. We will remain available to consider patient for additional modification of treatment pending response to treatment for his hepatitis and follow-up evaluation of his pain in [facility]. The patient was understanding and agrees with suggested treatment plan     Review of Systems     Objective:   Physical Exam  There was mild tenderness of the splenius capitis and occipitalis musculature regions. No newly formed lesions of the head and neck were noted. Palpation over the region of the cervical facet cervical paraspinal musculature region associated with moderate moderately severe muscle spasm. There was unremarkable Spurling's maneuver. There appeared to be no increased pain with Tinel and Phalen's maneuver. Grip strength appeared to be equal bilaterally. Palpation of the thoracic facet thoracic paraspinal musculature region associated with tinged palpation of moderate degree with moderate muscle spasms noted. Palpation of the lumbar paraspinal musculature region lumbar facet region associated with moderately severe discomfort with moderate to severe muscle spasms noted in the lumbar paraspinal musculature region. Straight leg raising limited to 20 without increase of pain with dorsiflexion noted. There was negative clonus negative Homans. Palpation of the greater  trochanteric region iliotibial band region associated with moderate discomfort. Abdomen nontender no costovertebral angle tenderness noted.      Assessment & Plan:  Degenerative disc disease cervical spine Status post surgery of cervical region  Degenerative disc disease lumbar spine L2-3, L3-4, L4-5 degenerative changes with multilevel foraminal stenosis predominantly bilaterally, L5-S1 foramen appears to be severe tenderness affecting the L5 nerve root Status post surgical intervention of the lumbar region  Sacroiliac joint dysfunction  Psoas muscle spasm  Lumbar facet syndrome  Epicondylitis and neuralgia of elbow   Plan  Continue present medications.  F/U PCP for evaliation of  BP and general medical  condition.  F/U surgical evaluation.  F/U neurological evaluation.  May consider radiofrequency rhizolysis or intraspinal procedures pending response to present treatment and F/U evaluation.  Patient to call [facility] should patient have concerns prior to scheduled return appointment.

## 2014-11-09 NOTE — Patient Instructions (Addendum)
Continue present medications.  F/U PCP for evaliation of  BP and general medical  condition.  F/U surgical evaluation.  F/U neurological evaluation.  May consider radiofrequency rhizolysis or intraspinal procedures pending response to present treatment and F/U evaluation.  Patient to call [facility] should patient have concerns prior to scheduled return appointment. Pain Management Discharge Instructions  General Discharge Instructions :  If you need to reach your doctor call: Monday-Friday 8:00 am - 4:00 pm at [phone] or toll free [phone].  After clinic hours [phone] to have operator reach doctor.  Bring all of your medication bottles to all your appointments in the pain clinic.  To cancel or reschedule your appointment with Pain Management please remember to call 24 hours in advance to avoid a fee.  Refer to the educational materials which you have been given on: General Risks, I had my Procedure. Discharge Instructions, Post Sedation.  Post Procedure Instructions:  The drugs you were given will stay in your system until tomorrow, so for the next 24 hours you should not drive, make any legal decisions or drink any alcoholic beverages.  You may eat anything you prefer, but it is better to start with liquids then soups and crackers, and gradually work up to solid foods.  Please notify your doctor immediately if you have any unusual bleeding, trouble breathing or pain that is not related to your normal pain.  Depending on the type of procedure that was done, some parts of your body may feel week and/or numb.  This usually clears up by tonight or the next day.  Walk with the use of an assistive device or accompanied by an adult for the 24 hours.  You may use ice on the affected area for the first 24 hours.  Put ice in a Ziploc bag and cover with a towel and place against area 15 minutes on 15 minutes off.  You may switch to heat after 24 hours. 

## 2014-11-09 NOTE — Progress Notes (Signed)
   Subjective:    Patient ID: [name], male    DOB: [birth year], 63 y.o.   MRN: [id number]  HPI    Review of Systems     Objective:   Physical Exam        Assessment & Plan:

## 2014-11-15 ENCOUNTER — Other Ambulatory Visit: Payer: Self-pay | Admitting: Pain Medicine

## 2014-11-24 ENCOUNTER — Telehealth: Payer: Self-pay | Admitting: Gastroenterology

## 2014-11-24 NOTE — Telephone Encounter (Signed)
Pt wife called and said he finished his Harvoni 6/6. Will he need blood work? He has to have some things done at the hospital Tuesday, can they use that blood. Please call

## 2014-11-27 ENCOUNTER — Other Ambulatory Visit: Payer: Self-pay

## 2014-11-27 DIAGNOSIS — D751 Secondary polycythemia: Secondary | ICD-10-CM

## 2014-11-27 DIAGNOSIS — B182 Chronic viral hepatitis C: Secondary | ICD-10-CM

## 2014-11-27 NOTE — Telephone Encounter (Signed)
Pt called requesting lab order for Hep C. Pt has completed treatment. Advised pt he will need to come in for his follow up appt. Pt has agreed to come in. Lab order has been sent to [facility].

## 2014-11-28 ENCOUNTER — Inpatient Hospital Stay: Payer: Medicare Other

## 2014-11-28 ENCOUNTER — Inpatient Hospital Stay: Payer: Medicare Other | Attending: Internal Medicine | Admitting: Internal Medicine

## 2014-11-28 VITALS — BP 130/86 | HR 54 | Temp 98.0°F | Resp 18 | Ht 69.0 in | Wt 205.0 lb

## 2014-11-28 DIAGNOSIS — Z79899 Other long term (current) drug therapy: Secondary | ICD-10-CM | POA: Insufficient documentation

## 2014-11-28 DIAGNOSIS — D751 Secondary polycythemia: Secondary | ICD-10-CM | POA: Diagnosis present

## 2014-11-28 LAB — CBC WITH DIFFERENTIAL/PLATELET
BASOS ABS: 0 10*3/uL (ref 0–0.1)
Basophils Relative: 1 %
Eosinophils Absolute: 0.1 10*3/uL (ref 0–0.7)
Eosinophils Relative: 1 %
HEMATOCRIT: 49.3 % (ref 40.0–52.0)
Hemoglobin: 16.2 g/dL (ref 13.0–18.0)
LYMPHS PCT: 27 %
Lymphs Abs: 1.9 10*3/uL (ref 1.0–3.6)
MCH: 30 pg (ref 26.0–34.0)
MCHC: 33 g/dL (ref 32.0–36.0)
MCV: 91 fL (ref 80.0–100.0)
MONO ABS: 0.9 10*3/uL (ref 0.2–1.0)
Monocytes Relative: 13 %
NEUTROS ABS: 4.3 10*3/uL (ref 1.4–6.5)
Neutrophils Relative %: 58 %
PLATELETS: 202 10*3/uL (ref 150–440)
RBC: 5.41 MIL/uL (ref 4.40–5.90)
RDW: 14.2 % (ref 11.5–14.5)
WBC: 7.2 10*3/uL (ref 3.8–10.6)

## 2014-12-07 ENCOUNTER — Ambulatory Visit (INDEPENDENT_AMBULATORY_CARE_PROVIDER_SITE_OTHER): Payer: Medicare Other | Admitting: Gastroenterology

## 2014-12-07 ENCOUNTER — Other Ambulatory Visit: Payer: Self-pay

## 2014-12-07 VITALS — BP 144/91 | HR 54 | Temp 98.3°F | Ht 69.0 in | Wt 207.0 lb

## 2014-12-07 DIAGNOSIS — B182 Chronic viral hepatitis C: Secondary | ICD-10-CM | POA: Diagnosis not present

## 2014-12-07 DIAGNOSIS — M4800 Spinal stenosis, site unspecified: Secondary | ICD-10-CM | POA: Insufficient documentation

## 2014-12-07 DIAGNOSIS — I10 Essential (primary) hypertension: Secondary | ICD-10-CM

## 2014-12-07 DIAGNOSIS — K2100 Gastro-esophageal reflux disease with esophagitis, without bleeding: Secondary | ICD-10-CM

## 2014-12-07 DIAGNOSIS — IMO0002 Reserved for concepts with insufficient information to code with codable children: Secondary | ICD-10-CM

## 2014-12-07 DIAGNOSIS — K219 Gastro-esophageal reflux disease without esophagitis: Secondary | ICD-10-CM | POA: Insufficient documentation

## 2014-12-07 DIAGNOSIS — C4490 Unspecified malignant neoplasm of skin, unspecified: Secondary | ICD-10-CM | POA: Insufficient documentation

## 2014-12-07 DIAGNOSIS — G4730 Sleep apnea, unspecified: Secondary | ICD-10-CM | POA: Insufficient documentation

## 2014-12-07 DIAGNOSIS — IMO0001 Reserved for inherently not codable concepts without codable children: Secondary | ICD-10-CM | POA: Insufficient documentation

## 2014-12-07 DIAGNOSIS — B1920 Unspecified viral hepatitis C without hepatic coma: Secondary | ICD-10-CM

## 2014-12-07 HISTORY — DX: Gastro-esophageal reflux disease with esophagitis, without bleeding: K21.00

## 2014-12-07 HISTORY — DX: Spinal stenosis, site unspecified: M48.00

## 2014-12-07 HISTORY — DX: Essential (primary) hypertension: I10

## 2014-12-07 HISTORY — DX: Reserved for concepts with insufficient information to code with codable children: IMO0002

## 2014-12-07 HISTORY — DX: Unspecified viral hepatitis C without hepatic coma: B19.20

## 2014-12-07 NOTE — Progress Notes (Signed)
Primary Care Physician: Gayland Curry, MD  Primary Gastroenterologist:  Dr. Lucilla Lame  Chief Complaint  Patient presents with  . F/U Hepatitis C    Treatment completed    HPI: [name] is a 64 y.o. male here for follow-up after treatment for hepatitis C. The patient also reports that he has been having some dysphagia and has a history of colon polyps and is in need of a repeat colonoscopy. The patient states that during the treatment for his hepatitis C the patient had some headaches but otherwise has been feeling well since stopping the medication. Most recent blood test showed the patient's viral load to be 0. The patient also reports that he has continued back pain which makes her life very difficult for him.  Current Outpatient Prescriptions  Medication Sig Dispense Refill  . amLODipine (NORVASC) 2.5 MG tablet Take 2.5 mg by mouth daily.    . citalopram (CELEXA) 20 MG tablet Take 20 mg by mouth daily.    . clonazePAM (KLONOPIN) 0.5 MG tablet Take 0.5 mg by mouth at bedtime. prn    . furosemide (LASIX) 40 MG tablet Take 40 mg by mouth daily. prn    . loratadine (CLARITIN) 10 MG tablet Take 10 mg by mouth daily.    . magnesium citrate SOLN Take 15 mLs by mouth once. As needed for constipation    . Oxycodone HCl 10 MG TABS Limit 1/2 to 1 by mouth 4-7 times daily if tolerated 200 tablet 0  . spironolactone (ALDACTONE) 25 MG tablet Take 25 mg by mouth daily.    . tamsulosin (FLOMAX) 0.4 MG CAPS capsule Take 0.4 mg by mouth daily.    [name] Kitchen testosterone cypionate (DEPOTESTOTERONE CYPIONATE) 200 MG/ML injection Inject 100 mg into the muscle every 7 (seven) days.     No current facility-administered medications for this visit.    Allergies as of 12/07/2014 - Review Complete 12/07/2014  Allergen Reaction Noted  . No known allergies  09/20/2014    ROS:  General: Negative for anorexia, weight loss, fever, chills, fatigue, weakness. ENT: Negative for hoarseness, difficulty  swallowing , nasal congestion. CV: Negative for chest pain, angina, palpitations, dyspnea on exertion, peripheral edema.  Respiratory: Negative for dyspnea at rest, dyspnea on exertion, cough, sputum, wheezing.  GI: See history of present illness. GU:  Negative for dysuria, hematuria, urinary incontinence, urinary frequency, nocturnal urination.  Endo: Negative for unusual weight change.    Physical Examination:   BP 144/91 mmHg  Pulse 54  Temp(Src) 98.3 F (36.8 C) (Oral)  Ht 5\' 9"  (1.753 m)  Wt 207 lb (93.895 kg)  BMI 30.55 kg/m2  General: Well-nourished, well-developed in no acute distress.  Eyes: No icterus. Conjunctivae pink. Mouth: Oropharyngeal mucosa moist and pink , no lesions erythema or exudate. Lungs: Clear to auscultation bilaterally. Non-labored. Heart: Regular rate and rhythm, no murmurs rubs or gallops.  Abdomen: Bowel sounds are normal, nontender, nondistended, no hepatosplenomegaly or masses, no abdominal bruits or hernia , no rebound or guarding.   Extremities: No lower extremity edema. No clubbing or deformities. Neuro: Alert and oriented x 3.  Grossly intact. Skin: Warm and dry, no jaundice.   Psych: Alert and cooperative, normal mood and affect.  Labs:    Imaging Studies: No results found.  Assessment and Plan:   [name] is a 64 y.o. y/o male who has a history of hepatitis C who has recently finished treatment. The most recent blood tests showed his viral load to be  undetectable. The patient also will have a repeat blood test in 6 months and a year. The patient has dysphagia and a history of colon polyps and will be set up for an EGD and colonoscopy.I have discussed risks & benefits which include, but are not limited to, bleeding, infection, perforation & drug reaction.  The patient agrees with this plan & written consent will be obtained.

## 2014-12-09 NOTE — Progress Notes (Signed)
[place]  [phone] [phone]     ID: [name] OB: [birth year]  MR#: [id number]  XHB#:[id number]  Patient Care Team: Gayland Curry, MD as PCP - General (Family Medicine)  CHIEF COMPLAINT/DIAGNOSIS:  Erythrocytosis, secondary to testosterone use. Resumed on phlebotomy on 03/04/13. Workup in February 2010 - Hematocrit 51, normal WBC and platelets. Carboxyhemoglobin 0.1%, serum erythropoietin 6.8,  [id number] mutation negative.  Patient gets phlebotomy 300 mL if Hct is 48 or higher.   HISTORY OF PRESENT ILLNESS:  patient returns for continued hematology followup, he was seen few months ago. He is on phlebotomy. Hematocrit has fluctuated in range of 48.8 - 51.3.  States that he is doing steady and denies any history of thromboembolic phenomena including DVT, pulmonary embolism, TIA, stroke or angina. He does not smoke. Has mild chronic fatigue on exertion, he continues on testosterone 0.5 mL injection once every week now and states his helps. Denies any headaches or facial flushing. No new bone pains. Appetite is good.   REVIEW OF SYSTEMS:   ROS As in HPI above. In addition, no fever, chills or sweats. No new headaches or focal weakness.  No new mood disturbances. No  sore throat, cough, shortness of breath, sputum, hemoptysis or chest pain. No dizziness or palpitation. No abdominal pain, constipation, diarrhea, dysuria or hematuria. No new skin rash or bleeding symptoms. No new paresthesias in extremities.     PAST MEDICAL HISTORY: Reviewed. Past Medical History  Diagnosis Date  . Arthritis   . Chronic back pain   . Prostatitis   . Erythrocytosis   . Cirrhosis   . Hepatitis C   . GERD (gastroesophageal reflux disease)   . Benign essential HTN 12/07/2014    Last Assessment & Plan:  Relevant Hx: Course: Daily Update: Today's Plan:   . Esophagitis, reflux 12/07/2014  . HCV (hepatitis C virus) 12/07/2014  . Testicular dysfunction 11/04/2011     Overview:  Dr. Jacqlyn Larsen is his urologist.   . 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    Overview:  severe in feet   . DDD (degenerative disc disease), lumbar 11/06/2014  . DDD (degenerative disc disease), cervical 11/06/2014  . Sacroiliac joint dysfunction 11/06/2014  . 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 Idiopathic scoliosis and kyphoscoliosis 03/01/2013  . 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/31/2013  . Spinal stenosis 12/07/2014  . 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  PAST SURGICAL HISTORY: Reviewed. Past Surgical History  Procedure Laterality Date  . Back surgery      FAMILY HISTORY: Reviewed. Family History  Problem Relation Age of Onset  . Arthritis Mother   . Asthma Mother   . Cancer Mother   . Heart disease Father   Denies hematological disorders like polycythemia or leukemia.  Remarkable for lymphoma, diabetes, heart disease and uterine cancer.  ADVANCED DIRECTIVES:  <no information>  SOCIAL HISTORY: Reviewed. History  Substance Use Topics  . Smoking status: Never Smoker   . Smokeless tobacco: Not on file  . Alcohol Use: No  Denies history of smoking.  Quit alcohol around 2004.  Denies recreational drug usage.  Allergies  Allergen Reactions  . No Known Allergies     Current Outpatient Prescriptions  Medication Sig Dispense Refill  . amLODipine (  NORVASC) 2.5 MG tablet Take 2.5 mg by mouth daily.    . citalopram (CELEXA) 20 MG tablet Take 20 mg by mouth daily.    . clonazePAM (KLONOPIN) 0.5 MG tablet Take 0.5 mg by mouth at bedtime. prn    . furosemide (LASIX) 40 MG tablet Take 40 mg by mouth daily. prn    . loratadine  (CLARITIN) 10 MG tablet Take 10 mg by mouth daily.    . magnesium citrate SOLN Take 15 mLs by mouth once. As needed for constipation    . Oxycodone HCl 10 MG TABS Limit 1/2 to 1 by mouth 4-7 times daily if tolerated 200 tablet 0  . spironolactone (ALDACTONE) 25 MG tablet Take 25 mg by mouth daily.    . tamsulosin (FLOMAX) 0.4 MG CAPS capsule Take 0.4 mg by mouth daily.    [name] Kitchen testosterone cypionate (DEPOTESTOTERONE CYPIONATE) 200 MG/ML injection Inject 100 mg into the muscle every 7 (seven) days.     No current facility-administered medications for this visit.    PHYSICAL EXAM: Filed Vitals:   11/28/14 1511  BP: 130/86  Pulse: 54  Temp: 98 F (36.7 C)  Resp: 18     Body mass index is 30.26 kg/(m^2).     GENERAL: Patient is alert and oriented and in no acute distress. There is no icterus. HEENT: EOMs intact. No cervical lymphadenopathy. CVS: S1S2, regular LUNGS: Bilaterally clear to auscultation, no rhonchi. ABDOMEN: Soft, nontender. No hepatosplenomegaly clinically.  NEURO: grossly nonfocal, cranial nerves are intact. Gait unremarkable. EXTREMITIES: No pedal edema.  LAB RESULTS:    Component Value Date/Time   NA 137 02/22/2012 0645   K 4.6 02/22/2012 0645   CL 103 02/22/2012 0645   CO2 30 02/22/2012 0645   GLUCOSE 90 02/22/2012 0645   BUN 9 02/22/2012 0645   BUN 15 11/15/2008 0810   CREATININE 0.97 02/22/2012 0645   CREATININE 1.14 11/15/2008 0810   CALCIUM 8.5 02/22/2012 0645   PROT 6.9 02/22/2012 0645   ALBUMIN 3.1* 02/22/2012 0645   AST 80* 02/22/2012 0645   ALT 96* 02/22/2012 0645   ALKPHOS 47* 02/22/2012 0645   GFRNONAA >60 02/22/2012 0645   GFRNONAA >60 11/15/2008 0810   GFRAA >60 02/22/2012 0645   GFRAA  11/15/2008 0810    >60        The eGFR has been calculated using the MDRD equation. This calculation has not been validated in all clinical situations. eGFR's persistently <60 mL/min signify possible Chronic Kidney Disease.   Lab Results  Component  Value Date   WBC 7.2 11/28/2014   NEUTROABS 4.3 11/28/2014   HGB 16.2 11/28/2014   HCT 49.3 11/28/2014   MCV 91.0 11/28/2014   PLT 202 11/28/2014     ASSESSMENT / PLAN:   Erythrocytosis, secondary to testosterone use. On intermittent phlebotomy in past for hematocrit > 52. (Workup in February 2010 had showed Hematocrit 51, normal WBC and platelets. Carboxyhemoglobin 0.1%, serum erythropoietin 6.8,  [id number] mutation negative)    -    Have reviewed labs from today and recent and d/w atient. He is doing steady, no history of thromboembolic phenomenon. Hematocrit continues to fluctuate in the upper normal range, today hematocrit is just above target range at 49.3 today. Given this, will pursue phlebotomy 300 mL today. Will continue to monitor hematocrit once every 6 weeks and pursue phlebotomy 300 mL each time if hematocrit is 48 or higher. Next MD follow up at about 11-12 months with repeat CBC and make further  treatment planning.  In between visits, patient advised to call or come to ER in case of any new symptoms or acute sickness. He is agreeable to this plan.     Leia Alf, MD   12/09/2014 6:14 PM

## 2014-12-12 ENCOUNTER — Encounter: Payer: Self-pay | Admitting: Pain Medicine

## 2014-12-12 ENCOUNTER — Ambulatory Visit: Payer: Medicare Other | Attending: Pain Medicine | Admitting: Pain Medicine

## 2014-12-12 VITALS — BP 147/88 | HR 52 | Temp 97.6°F | Resp 16 | Ht 69.0 in | Wt 205.0 lb

## 2014-12-12 DIAGNOSIS — M5136 Other intervertebral disc degeneration, lumbar region: Secondary | ICD-10-CM | POA: Insufficient documentation

## 2014-12-12 DIAGNOSIS — M5030 Other cervical disc degeneration, unspecified cervical region: Secondary | ICD-10-CM | POA: Diagnosis not present

## 2014-12-12 DIAGNOSIS — M533 Sacrococcygeal disorders, not elsewhere classified: Secondary | ICD-10-CM | POA: Diagnosis not present

## 2014-12-12 DIAGNOSIS — M542 Cervicalgia: Secondary | ICD-10-CM | POA: Diagnosis present

## 2014-12-12 DIAGNOSIS — Z9889 Other specified postprocedural states: Secondary | ICD-10-CM | POA: Diagnosis not present

## 2014-12-12 DIAGNOSIS — Z981 Arthrodesis status: Secondary | ICD-10-CM

## 2014-12-12 DIAGNOSIS — M4806 Spinal stenosis, lumbar region: Secondary | ICD-10-CM | POA: Insufficient documentation

## 2014-12-12 DIAGNOSIS — M549 Dorsalgia, unspecified: Secondary | ICD-10-CM | POA: Diagnosis present

## 2014-12-12 MED ORDER — OXYCODONE HCL 10 MG PO TABS: ORAL_TABLET | ORAL | Status: DC

## 2014-12-12 NOTE — Progress Notes (Signed)
   Subjective:    Patient ID: [name], male    DOB: 1950/08/21, 64 y.o.   MRN: [id number]  HPI  Patient is 64 year old gentleman returns [facility] for further evaluation and treatment of pain involving the neck and entire back upper and lower extremity regions. Patient has had increased pain of the lower back region associated with lower extremity pain and weakness. At the present time we will advise patient follow-up with Dr. Ellene Route and Dr. Bland Span to discuss additional studies including lumbar MRI or CT myelogram. We will remain available to consider patient for interventional treatment pending surgical disposition. The patient is also status post treatment for his hepatitis and at the present time is without evidence of hepatitis following his recent treatment. Patient will undergo follow-up evaluation of his hepatitis is planned at the present time we will await surgical disposition and will consider modification of treatment pending follow-up evaluation. The patient was in agreement with suggested plan    Review of Systems     Objective:   Physical Exam  There was tenderness of the splenius capitis and occipitalis musculature region of moderate degree with well-healed surgical scar of the cervical region without increased warmth or erythema in the region of the scar. Patient was with tends to palpation over the cervical facet muscular region and thoracic facet region of moderate degree. Palpation of the acromioclavicular and glenohumeral joint regions reproduced mild discomfort. Tinel and Phalen's maneuver were without increased pain of any significant degree. There was unremarkable Spurling's maneuver. Palpation over the thoracic facet thoracic paraspinal musculature region was evidence of moderate muscle spasms in the mid and lower thoracic regions on the left as well as on the right. Palpation over the lumbar paraspinal musculature region lumbar facet region was with  moderate to moderately severe discomfort on the left as well as on the right with severe muscle spasms noted in the lumbar paraspinal musculature region. Straight leg raising was limited to approximately 20. Decreased EHL strength was noted. There was questionable decreased sensation along the L5 dermatomal distribution. There was negative clonus negative Homans. DTRs difficult to elicit. There was moderate tends to moderately severe tenderness of the PSIS and PII S regions. Abdomen was nontender and no costovertebral angle tenderness was noted.      Assessment & Plan:  Degenerative disc disease lumbar spine Postoperative changes L3-S1, laminectomy, epidural scarring present anteriorly and thickening of epidural soft tissues no evidence of arachnoiditis. Severe neuroforaminal stenosis bilaterally, L5-S1 exiting nerve root impingement, hearing degrees with foraminal stenosis at other levels, adjacent facet degenerative joint disease and hypertrophy with possible synovitis L3-4 and L4-L5. No synovial effusions or abscesses  Lumbar stenosis with neurogenic claudication  Lumbar facet syndrome  Degenerative disc disease cervical spine Status post surgery of the cervical region  Sacroiliac joint dysfunction    Plan   Continue present medications oxycodone   F/U PCP for evaliation of  BP and general medical  condition.  F/U surgical evaluation Dr. Ellene Route and Dr. Bland Span to consider CT myelogram MRI or other studies   F/U neurological evaluation  May consider radiofrequency rhizolysis or intraspinal procedures pending response to present treatment and F/U evaluation.  Patient to call [facility] should patient have concerns prior to scheduled return appointment.

## 2014-12-12 NOTE — Progress Notes (Signed)
Safety precautions to be maintained throughout the outpatient stay will include: orient to surroundings, keep bed in low position, maintain call bell within reach at all times, provide assistance with transfer out of bed and ambulation.  

## 2014-12-12 NOTE — Patient Instructions (Addendum)
Continue present medications oxycodone as discussed  F/U PCP for evaliation of  BP and general medical  condition.  F/U surgical evaluation with Dr. Ellene Route and Dr. Bland Span to determine if need updated MRI or CT myelogram  F/U neurological evaluation  May consider radiofrequency rhizolysis or intraspinal procedures pending response to present treatment and F/U evaluation.  Patient to call [facility] should patient have concerns prior to scheduled return appointment. meds due8/3/16

## 2014-12-15 ENCOUNTER — Telehealth: Payer: Self-pay | Admitting: Pain Medicine

## 2014-12-15 ENCOUNTER — Other Ambulatory Visit: Payer: Self-pay | Admitting: Pain Medicine

## 2014-12-15 DIAGNOSIS — M5136 Other intervertebral disc degeneration, lumbar region: Secondary | ICD-10-CM

## 2014-12-15 DIAGNOSIS — M5416 Radiculopathy, lumbar region: Secondary | ICD-10-CM

## 2014-12-15 DIAGNOSIS — Z9889 Other specified postprocedural states: Secondary | ICD-10-CM

## 2014-12-15 DIAGNOSIS — M47816 Spondylosis without myelopathy or radiculopathy, lumbar region: Secondary | ICD-10-CM

## 2014-12-15 DIAGNOSIS — M51369 Other intervertebral disc degeneration, lumbar region without mention of lumbar back pain or lower extremity pain: Secondary | ICD-10-CM

## 2014-12-15 NOTE — Telephone Encounter (Signed)
[name] has gotten worse in pain, pcp is out of town , would dr crisp order mri / [facility] is place he wants to get it done at. She may have to take him to ER

## 2014-12-15 NOTE — Telephone Encounter (Signed)
Pain in lower back, down both legs. Wants MRI.

## 2014-12-15 NOTE — Telephone Encounter (Signed)
Dena, Nurses, and Staff I ordered L-MRI   Please call patient now so that he can get L-MRI   ASAP Call imaging center to give the imaging center the order.

## 2014-12-15 NOTE — Discharge Instructions (Signed)

## 2014-12-18 ENCOUNTER — Other Ambulatory Visit: Payer: Self-pay | Admitting: Gastroenterology

## 2014-12-18 ENCOUNTER — Ambulatory Visit: Payer: Medicare Other | Admitting: Anesthesiology

## 2014-12-18 ENCOUNTER — Encounter
Admission: RE | Disposition: A | Discharge status: Home / Self Care | Payer: Self-pay | Source: Ambulatory Visit | Attending: Gastroenterology

## 2014-12-18 ENCOUNTER — Ambulatory Visit
Admission: RE | Admit: 2014-12-18 | Discharge: 2014-12-18 | Disposition: A | Discharge status: Home / Self Care | Payer: Medicare Other | Source: Ambulatory Visit | Attending: Gastroenterology | Admitting: Gastroenterology

## 2014-12-18 DIAGNOSIS — G4730 Sleep apnea, unspecified: Secondary | ICD-10-CM | POA: Diagnosis not present

## 2014-12-18 DIAGNOSIS — R1310 Dysphagia, unspecified: Secondary | ICD-10-CM | POA: Diagnosis present

## 2014-12-18 DIAGNOSIS — Z860100 Personal history of colon polyps, unspecified: Secondary | ICD-10-CM | POA: Insufficient documentation

## 2014-12-18 DIAGNOSIS — G8929 Other chronic pain: Secondary | ICD-10-CM | POA: Diagnosis not present

## 2014-12-18 DIAGNOSIS — K319 Disease of stomach and duodenum, unspecified: Secondary | ICD-10-CM | POA: Insufficient documentation

## 2014-12-18 DIAGNOSIS — Z1211 Encounter for screening for malignant neoplasm of colon: Secondary | ICD-10-CM | POA: Diagnosis not present

## 2014-12-18 DIAGNOSIS — M5136 Other intervertebral disc degeneration, lumbar region: Secondary | ICD-10-CM | POA: Diagnosis not present

## 2014-12-18 DIAGNOSIS — K640 First degree hemorrhoids: Secondary | ICD-10-CM

## 2014-12-18 DIAGNOSIS — M1990 Unspecified osteoarthritis, unspecified site: Secondary | ICD-10-CM | POA: Insufficient documentation

## 2014-12-18 DIAGNOSIS — Q402 Other specified congenital malformations of stomach: Secondary | ICD-10-CM | POA: Insufficient documentation

## 2014-12-18 DIAGNOSIS — Z79899 Other long term (current) drug therapy: Secondary | ICD-10-CM | POA: Diagnosis not present

## 2014-12-18 DIAGNOSIS — M5489 Other dorsalgia: Secondary | ICD-10-CM | POA: Diagnosis not present

## 2014-12-18 DIAGNOSIS — D120 Benign neoplasm of cecum: Secondary | ICD-10-CM | POA: Insufficient documentation

## 2014-12-18 DIAGNOSIS — Q458 Other specified congenital malformations of digestive system: Secondary | ICD-10-CM | POA: Insufficient documentation

## 2014-12-18 DIAGNOSIS — G629 Polyneuropathy, unspecified: Secondary | ICD-10-CM | POA: Diagnosis not present

## 2014-12-18 DIAGNOSIS — I10 Essential (primary) hypertension: Secondary | ICD-10-CM | POA: Insufficient documentation

## 2014-12-18 DIAGNOSIS — M4800 Spinal stenosis, site unspecified: Secondary | ICD-10-CM | POA: Insufficient documentation

## 2014-12-18 DIAGNOSIS — E299 Testicular dysfunction, unspecified: Secondary | ICD-10-CM | POA: Diagnosis not present

## 2014-12-18 DIAGNOSIS — K5730 Diverticulosis of large intestine without perforation or abscess without bleeding: Secondary | ICD-10-CM | POA: Diagnosis not present

## 2014-12-18 DIAGNOSIS — K2970 Gastritis, unspecified, without bleeding: Secondary | ICD-10-CM | POA: Insufficient documentation

## 2014-12-18 DIAGNOSIS — K210 Gastro-esophageal reflux disease with esophagitis: Secondary | ICD-10-CM | POA: Insufficient documentation

## 2014-12-18 DIAGNOSIS — B1920 Unspecified viral hepatitis C without hepatic coma: Secondary | ICD-10-CM | POA: Diagnosis not present

## 2014-12-18 HISTORY — DX: Dorsalgia, unspecified: M54.9

## 2014-12-18 HISTORY — PX: COLONOSCOPY WITH PROPOFOL: SHX5780

## 2014-12-18 HISTORY — DX: Pain in left leg: M79.605

## 2014-12-18 HISTORY — PX: POLYPECTOMY: SHX5525

## 2014-12-18 HISTORY — DX: Pain in right leg: M79.604

## 2014-12-18 HISTORY — PX: ESOPHAGOGASTRODUODENOSCOPY (EGD) WITH PROPOFOL: SHX5813

## 2014-12-18 HISTORY — DX: Unspecified mononeuropathy of bilateral lower limbs: G57.93

## 2014-12-18 SURGERY — COLONOSCOPY WITH PROPOFOL
Anesthesia: Monitor Anesthesia Care | Wound class: Contaminated

## 2014-12-18 MED ORDER — ACETAMINOPHEN 160 MG/5ML PO SOLN: 325.0000 mg | ORAL | Status: DC | PRN

## 2014-12-18 MED ORDER — PROPOFOL 10 MG/ML IV BOLUS
INTRAVENOUS | Status: DC | PRN
  Administered 2014-12-18 (×14): 20 mg via INTRAVENOUS

## 2014-12-18 MED ORDER — ACETAMINOPHEN 325 MG PO TABS: 325.0000 mg | ORAL_TABLET | ORAL | Status: DC | PRN

## 2014-12-18 MED ORDER — SIMETHICONE 40 MG/0.6ML PO SUSP
ORAL | Status: DC | PRN
  Administered 2014-12-18: 11:00:00

## 2014-12-18 MED ORDER — LACTATED RINGERS IV SOLN
INTRAVENOUS | Status: DC
  Administered 2014-12-18 (×2): via INTRAVENOUS

## 2014-12-18 MED ORDER — LIDOCAINE HCL (CARDIAC) 20 MG/ML IV SOLN
INTRAVENOUS | Status: DC | PRN
  Administered 2014-12-18: 30 mg via INTRAVENOUS

## 2014-12-18 SURGICAL SUPPLY — 39 items
BALLN DILATOR 10-12 8 (BALLOONS)
BALLN DILATOR 12-15 8 (BALLOONS)
BALLN DILATOR 15-18 8 (BALLOONS)
BALLN DILATOR CRE 0-12 8 (BALLOONS)
BALLN DILATOR ESOPH 8 10 CRE (MISCELLANEOUS) IMPLANT
BALLOON DILATOR 12-15 8 (BALLOONS) IMPLANT
BALLOON DILATOR 15-18 8 (BALLOONS) IMPLANT
BALLOON DILATOR CRE 0-12 8 (BALLOONS) IMPLANT
BLOCK BITE 60FR ADLT L/F GRN (MISCELLANEOUS) ×4 IMPLANT
CANISTER SUCT 1200ML W/VALVE (MISCELLANEOUS) ×4 IMPLANT
FCP ESCP3.2XJMB 240X2.8X (MISCELLANEOUS)
FORCEPS BIOP RAD 4 LRG CAP 4 (CUTTING FORCEPS) ×4 IMPLANT
FORCEPS BIOP RJ4 240 W/NDL (MISCELLANEOUS)
FORCEPS ESCP3.2XJMB 240X2.8X (MISCELLANEOUS) IMPLANT
GOWN CVR UNV OPN BCK APRN NK (MISCELLANEOUS) ×4 IMPLANT
GOWN ISOL THUMB LOOP REG UNIV (MISCELLANEOUS) ×4
HEMOCLIP INSTINCT (CLIP) IMPLANT
INJECTOR VARIJECT VIN23 (MISCELLANEOUS) IMPLANT
KIT CO2 TUBING (TUBING) IMPLANT
KIT DEFENDO VALVE AND CONN (KITS) IMPLANT
KIT ENDO PROCEDURE OLY (KITS) ×4 IMPLANT
LIGATOR MULTIBAND 6SHOOTER MBL (MISCELLANEOUS) IMPLANT
MARKER SPOT ENDO TATTOO 5ML (MISCELLANEOUS) IMPLANT
PAD GROUND ADULT SPLIT (MISCELLANEOUS) IMPLANT
SNARE SHORT THROW 13M SML OVAL (MISCELLANEOUS) IMPLANT
SNARE SHORT THROW 30M LRG OVAL (MISCELLANEOUS) IMPLANT
SPOT EX ENDOSCOPIC TATTOO (MISCELLANEOUS)
SUCTION POLY TRAP 4CHAMBER (MISCELLANEOUS) IMPLANT
SYR INFLATION 60ML (SYRINGE) IMPLANT
TRAP SUCTION POLY (MISCELLANEOUS) IMPLANT
TUBING CONN 6MMX3.1M (TUBING)
TUBING SUCTION CONN 0.25 STRL (TUBING) IMPLANT
UNDERPAD 30X60 958B10 (PK) (MISCELLANEOUS) IMPLANT
VALVE BIOPSY ENDO (VALVE) IMPLANT
VARIJECT INJECTOR VIN23 (MISCELLANEOUS)
WATER AUXILLARY (MISCELLANEOUS) IMPLANT
WATER STERILE IRR 250ML POUR (IV SOLUTION) ×4 IMPLANT
WATER STERILE IRR 500ML POUR (IV SOLUTION) IMPLANT
WIRE CRE 18-20MM 8CM F G (MISCELLANEOUS) IMPLANT

## 2014-12-18 NOTE — Anesthesia Preprocedure Evaluation (Signed)
Anesthesia Evaluation  Patient identified by MRN, date of birth, ID band  Reviewed: Allergy & Precautions, H&P , NPO status , Patient's Chart, lab work & pertinent test results  Airway Mallampati: II  TM Distance: >3 FB Neck ROM: full    Dental no notable dental hx.    Pulmonary sleep apnea ,    Pulmonary exam normal       Cardiovascular hypertension, Rhythm:regular Rate:Normal     Neuro/Psych    GI/Hepatic GERD-  ,(+) Hepatitis -, C  Endo/Other    Renal/GU      Musculoskeletal   Abdominal   Peds  Hematology   Anesthesia Other Findings   Reproductive/Obstetrics                             Anesthesia Physical Anesthesia Plan  ASA: II  Anesthesia Plan: MAC   Post-op Pain Management:    Induction:   Airway Management Planned:   Additional Equipment:   Intra-op Plan:   Post-operative Plan:   Informed Consent: I have reviewed the patients History and Physical, chart, labs and discussed the procedure including the risks, benefits and alternatives for the proposed anesthesia with the patient or authorized representative who has indicated his/her understanding and acceptance.     Plan Discussed with: CRNA  Anesthesia Plan Comments:         Anesthesia Quick Evaluation

## 2014-12-18 NOTE — Op Note (Signed)
[facility] Patient Name: [name] Procedure Date: 12/18/2014 10:22 AM MRN: [id number] Account #: [id number] Date of Birth: February 17, 1951 Admit Type: Outpatient Age: 64 Room: [facility] OR ROOM 01 Gender: Male Note Status: Finalized Procedure:         Colonoscopy Indications:       High risk colon cancer surveillance: Personal history of                     colonic polyps Providers:         Lucilla Lame, MD Medicines:         Propofol per Anesthesia Complications:     No immediate complications. Procedure:         Pre-Anesthesia Assessment:                    - Prior to the procedure, a History and Physical was                     performed, and patient medications and allergies were                     reviewed. The patient's tolerance of previous anesthesia                     was also reviewed. The risks and benefits of the procedure                     and the sedation options and risks were discussed with the                     patient. All questions were answered, and informed consent                     was obtained. Prior Anticoagulants: The patient has taken                     no previous anticoagulant or antiplatelet agents. ASA                     Grade Assessment: II - A patient with mild systemic                     disease. After reviewing the risks and benefits, the                     patient was deemed in satisfactory condition to undergo                     the procedure.                    After obtaining informed consent, the colonoscope was                     passed under direct vision. Throughout the procedure, the                     patient's blood pressure, pulse, and oxygen saturations                     were monitored continuously. The Olympus CF H180AL                     colonoscope (S#: [id number]) was introduced through the anus  and advanced to the the cecum, identified by appendiceal          orifice and ileocecal valve. The colonoscopy was performed                     without difficulty. The patient tolerated the procedure                     well. The quality of the bowel preparation was excellent. Findings:      The perianal and digital rectal examinations were normal.      A 3 mm polyp was found in the cecum. The polyp was sessile. The polyp       was removed with a cold biopsy forceps. Resection and retrieval were       complete.      A few small-mouthed diverticula were found in the sigmoid colon, in the       descending colon and in the ascending colon.      Non-bleeding internal hemorrhoids were found during retroflexion. The       hemorrhoids were Grade I (internal hemorrhoids that do not prolapse). Impression:        - One 3 mm polyp in the cecum. Resected and retrieved.                    - Diverticulosis in the sigmoid colon, in the descending                     colon and in the ascending colon.                    - Non-bleeding internal hemorrhoids. Recommendation:    - Repeat colonoscopy in 5 years for surveillance. Procedure Code(s): --- Professional ---                    [phone], Colonoscopy, flexible; with biopsy, single or                     multiple Diagnosis Code(s): --- Professional ---                    Z86.010, Personal history of colonic polyps                    D12.0, Benign neoplasm of cecum                    K64.0, First degree hemorrhoids CPT copyright 2014 American Medical Association. All rights reserved. The codes documented in this report are preliminary and upon coder review may  be revised to meet current compliance requirements. Lucilla Lame, MD 12/18/2014 11:09:23 AM This report has been signed electronically. Number of Addenda: 0 Note Initiated On: 12/18/2014 10:22 AM Scope Withdrawal Time: 0 hours 6 minutes 36 seconds  Total Procedure Duration: 0 hours 12 minutes 7 seconds       [facility]

## 2014-12-18 NOTE — H&P (Signed)
[facility]  [address] [place], [place] [address] Phone: [phone] Fax : [phone]  Primary Care Physician:  Gayland Curry, MD Primary Gastroenterologist:  Dr. Allen Norris  Pre-Procedure History & Physical: HPI:  [name] is a 64 y.o. male is here for an endoscopy and colonoscopy.   Past Medical History  Diagnosis Date  . Arthritis   . Chronic back pain   . Prostatitis   . Erythrocytosis   . Cirrhosis   . GERD (gastroesophageal reflux disease)   . Esophagitis, reflux 12/07/2014  . Testicular dysfunction 11/04/2011    Overview:  Dr. Jacqlyn Larsen is his urologist.   . DDD (degenerative disc disease), lumbar 11/06/2014  . DDD (degenerative disc disease), cervical 11/06/2014  . Sacroiliac joint dysfunction 11/06/2014  . 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 Idiopathic scoliosis and kyphoscoliosis 03/01/2013  . 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/31/2013  . Spinal stenosis 12/07/2014  . 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 Benign essential HTN 12/07/2014    Last Assessment & Plan:  Relevant Hx: Course: Daily Update: Today's Plan: CONTROLLED ON MEDS  . Back pain     MULTIPLE BACK SURGERIES  . 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    Overview:  severe in feet   . Neuropathy of both feet   . Painful legs and moving toes     DUE TO BACK PROBLEMS  . Hepatitis C     DID HARVONI TREATMENT/ NO LONGER HEP C POSITIVE  . HCV (hepatitis C virus) 12/07/2014    Past Surgical History  Procedure Laterality Date  . Cholecystectomy  07/2013  . Finger surgery Left 2015    THUMB  . Back surgery  04/14/13    L2-S1  . Elbow arthroscopy Right 02/2012  . Cervical spine surgery       X2 FUSION C4-7  . Skin surgery  2004    SKIN CANCER NOSE  . Nose surgery  12/01/02    SEPTUM RECONSTRUCTION  . Fundiplication  5320  . Nissen fundoplication  2334  . Septoplasty  1999  . Cataract extraction  Left 1989  . Foot surgery Right 1984  . Shoulder surgery Right 1983  . Shoulder surgery Left 1979    WITH ELBOW (TENDON RELEASE)  . Facial reconstruction surgery  1970    BOTTLE CUT NOSE AND EYE, MULTIPLE SURGERIES  . Finger surgery Right 1966    MIDDLE  . Hernia repair Left 1981/1957    RIGHT SIDE IN 1957    Prior to Admission medications   Medication Sig Start Date End Date Taking? Authorizing Provider  amLODipine (NORVASC) 2.5 MG tablet Take 2.5 mg by mouth daily. AM   Yes Historical Provider, MD  citalopram (CELEXA) 20 MG tablet Take 20 mg by mouth daily. AM   Yes Historical Provider, MD  clonazePAM (KLONOPIN) 0.5 MG tablet Take 0.5 mg by mouth as needed. prn   Yes Historical Provider, MD  dexlansoprazole (DEXILANT) 60 MG capsule Take 60 mg by mouth daily. AM   Yes Historical Provider, MD  furosemide (LASIX) 40 MG tablet Take 40 mg by mouth as needed. prn   Yes Historical Provider, MD  loratadine (CLARITIN) 10 MG tablet Take 10 mg by mouth daily. AM   Yes Historical Provider, MD  magnesium citrate SOLN Take 15 mLs by mouth daily. As needed for constipation   Yes Historical Provider, MD  Oxycodone HCl 10 MG TABS Limit 1/2 to 1  by mouth 4-8 times daily if tolerated Patient taking differently: Limit 1/2 to 1 by mouth 4-8 times daily if tolerated 12/12/14  Yes Mohammed Kindle, MD  spironolactone (ALDACTONE) 25 MG tablet Take 25 mg by mouth daily. AM   Yes Historical Provider, MD  tamsulosin (FLOMAX) 0.4 MG CAPS capsule Take 0.4 mg by mouth daily. AM   Yes Historical Provider, MD  testosterone cypionate (DEPOTESTOTERONE CYPIONATE) 200 MG/ML injection Inject 100 mg into the muscle every 7 (seven) days.   Yes Historical Provider, MD    Allergies as of 12/07/2014 - Review Complete 12/07/2014  Allergen Reaction Noted  . No known allergies  09/20/2014    Family History  Problem Relation Age of Onset  . Arthritis Mother   . Asthma Mother   . Cancer Mother   . Heart disease Father      History   Social History  . Marital Status: Married    Spouse Name: N/A  . Number of Children: N/A  . Years of Education: N/A   Occupational History  . Not on file.   Social History Main Topics  . Smoking status: Never Smoker   . Smokeless tobacco: Not on file  . Alcohol Use: No  . Drug Use: No  . Sexual Activity: Not on file   Other Topics Concern  . Not on file   Social History Narrative    Review of Systems: See HPI, otherwise negative ROS  Physical Exam: BP 156/93 mmHg  Pulse 63  Temp(Src) 98.2 F (36.8 C) (Temporal)  Resp 16  Ht 5\' 9"  (1.753 m)  Wt 205 lb (92.987 kg)  BMI 30.26 kg/m2  SpO2 96% General:   Alert,  pleasant and cooperative in NAD Head:  Normocephalic and atraumatic. Neck:  Supple; no masses or thyromegaly. Lungs:  Clear throughout to auscultation.    Heart:  Regular rate and rhythm. Abdomen:  Soft, nontender and nondistended. Normal bowel sounds, without guarding, and without rebound.   Neurologic:  Alert and  oriented x4;  grossly normal neurologically.  Impression/Plan: [name] is here for an endoscopy and colonoscopy to be performed for dysphagia and histroy of polyps  Risks, benefits, limitations, and alternatives regarding  endoscopy and colonoscopy have been reviewed with the patient.  Questions have been answered.  All parties agreeable.   Ollen Bowl, MD  12/18/2014, 10:24 AM

## 2014-12-18 NOTE — Op Note (Signed)
[facility] Gastroenterology Patient Name: [name] Procedure Date: 12/18/2014 10:22 AM MRN: [id number] Account #: [id number] Date of Birth: 07-04-1950 Admit Type: Outpatient Age: 64 Room: [facility] OR ROOM 01 Gender: Male Note Status: Finalized Procedure:         Upper GI endoscopy Indications:       Dysphagia Providers:         Lucilla Lame, MD Referring MD:      Gayland Curry, MD (Referring MD) Medicines:         Propofol per Anesthesia Complications:     No immediate complications. Procedure:         Pre-Anesthesia Assessment:                    - Prior to the procedure, a History and Physical was                     performed, and patient medications and allergies were                     reviewed. The patient's tolerance of previous anesthesia                     was also reviewed. The risks and benefits of the procedure                     and the sedation options and risks were discussed with the                     patient. All questions were answered, and informed consent                     was obtained. Prior Anticoagulants: The patient has taken                     no previous anticoagulant or antiplatelet agents. ASA                     Grade Assessment: II - A patient with mild systemic                     disease. After reviewing the risks and benefits, the                     patient was deemed in satisfactory condition to undergo                     the procedure.                    After obtaining informed consent, the endoscope was passed                     under direct vision. Throughout the procedure, the                     patient's blood pressure, pulse, and oxygen saturations                     were monitored continuously. The Olympus GIF-HQ190                     Endoscope (S#. [id number]) was introduced through the mouth,  and advanced to the second part of duodenum. The upper GI                     endoscopy was  accomplished without difficulty. The patient                     tolerated the procedure well. 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 Biopsies were taken with a cold forceps for histology.      Localized moderate inflammation characterized by erythema was found in       the gastric antrum. Biopsies were taken with a cold forceps for       histology.      The examined duodenum was normal.      Random biopsies were obtained with cold forceps for histology in the       middle third of the esophagus.      The scope was withdrawn. Dilation was attempted, but the lesion was not       amenable to treatment in the entire esophagus with a Maloney dilator       because no resistance at 52 Fr. 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                    - Gastritis. Biopsied.                    - Normal examined duodenum.                    - Random biopsies were obtained in the middle third of the                     esophagus.                    - Dilation attempted in the entire esophagus. Recommendation:    - Await pathology results. Procedure Code(s): --- Professional ---                    [phone], Esophagogastroduodenoscopy, flexible, transoral;                     with biopsy, single or multiple                    43450, Dilation of esophagus, by unguided sound or bougie,                     single or multiple passes Diagnosis Code(s): --- Professional ---                    R13.10, Dysphagia, unspecified                    K29.70, Gastritis, unspecified, without bleeding                    Q40.2, Other specified congenital malformations of stomach CPT copyright 2014 American Medical Association. All rights reserved. The codes documented in this report are preliminary and upon coder review may  be revised to meet current  compliance  requirements. Lucilla Lame, MD 12/18/2014 10:53:21 AM This report has been signed electronically. Number of Addenda: 0 Note Initiated On: 12/18/2014 10:22 AM Total Procedure Duration: 0 hours 9 minutes 22 seconds       [facility]

## 2014-12-18 NOTE — Telephone Encounter (Signed)
Does he need prior auth?

## 2014-12-18 NOTE — Anesthesia Postprocedure Evaluation (Signed)
  Anesthesia Post-op Note  Patient: [name]  Procedure(s) Performed: Procedure(s): COLONOSCOPY WITH PROPOFOL (N/A) ESOPHAGOGASTRODUODENOSCOPY (EGD) WITH PROPOFOL with dialtion (N/A) POLYPECTOMY  Anesthesia type:MAC  Patient location: PACU  Post pain: Pain level controlled  Post assessment: Post-op Vital signs reviewed, Patient's Cardiovascular Status Stable, Respiratory Function Stable, Patent Airway and No signs of Nausea or vomiting  Post vital signs: Reviewed and stable  Last Vitals:  Filed Vitals:   12/18/14 1115  BP: 121/86  Pulse: 50  Temp:   Resp: 12    Level of consciousness: awake, alert  and patient cooperative  Complications: No apparent anesthesia complications

## 2014-12-18 NOTE — Transfer of Care (Signed)
Immediate Anesthesia Transfer of Care Note  Patient: [name]  Procedure(s) Performed: Procedure(s): COLONOSCOPY WITH PROPOFOL (N/A) ESOPHAGOGASTRODUODENOSCOPY (EGD) WITH PROPOFOL with dialtion (N/A) POLYPECTOMY  Patient Location: PACU  Anesthesia Type: MAC  Level of Consciousness: awake, alert  and patient cooperative  Airway and Oxygen Therapy: Patient Spontanous Breathing and Patient connected to supplemental oxygen  Post-op Assessment: Post-op Vital signs reviewed, Patient's Cardiovascular Status Stable, Respiratory Function Stable, Patent Airway and No signs of Nausea or vomiting  Post-op Vital Signs: Reviewed and stable  Complications: No apparent anesthesia complications

## 2014-12-19 ENCOUNTER — Encounter: Payer: Self-pay | Admitting: Gastroenterology

## 2014-12-21 ENCOUNTER — Inpatient Hospital Stay: Admission: RE | Admit: 2014-12-21 | Payer: PRIVATE HEALTH INSURANCE | Source: Ambulatory Visit

## 2014-12-26 ENCOUNTER — Telehealth: Payer: Self-pay

## 2014-12-26 ENCOUNTER — Other Ambulatory Visit: Payer: Self-pay | Admitting: Pain Medicine

## 2014-12-26 ENCOUNTER — Ambulatory Visit
Admission: RE | Admit: 2014-12-26 | Discharge: 2014-12-26 | Disposition: A | Discharge status: Home / Self Care | Payer: Medicare Other | Source: Ambulatory Visit | Attending: Pain Medicine | Admitting: Pain Medicine

## 2014-12-26 DIAGNOSIS — M5136 Other intervertebral disc degeneration, lumbar region: Secondary | ICD-10-CM

## 2014-12-26 DIAGNOSIS — M5416 Radiculopathy, lumbar region: Secondary | ICD-10-CM

## 2014-12-26 DIAGNOSIS — Z9889 Other specified postprocedural states: Secondary | ICD-10-CM

## 2014-12-26 DIAGNOSIS — M47816 Spondylosis without myelopathy or radiculopathy, lumbar region: Secondary | ICD-10-CM

## 2014-12-26 MED ORDER — GADOBENATE DIMEGLUMINE 529 MG/ML IV SOLN
19.0000 mL | Freq: Once | INTRAVENOUS | Status: AC | PRN
  Administered 2014-12-26: 19 mL via INTRAVENOUS

## 2014-12-26 NOTE — Telephone Encounter (Signed)
Spoke with patient and gave him MRI results given.  Patient would like to discuss having procedure with Dr Primus Bravo. Dr Primus Bravo Notified.

## 2015-01-02 ENCOUNTER — Encounter: Payer: Self-pay | Admitting: Gastroenterology

## 2015-01-09 ENCOUNTER — Ambulatory Visit: Payer: Medicare Other | Attending: Pain Medicine | Admitting: Pain Medicine

## 2015-01-09 ENCOUNTER — Inpatient Hospital Stay: Payer: Medicare Other | Attending: Internal Medicine

## 2015-01-09 ENCOUNTER — Inpatient Hospital Stay: Payer: Medicare Other

## 2015-01-09 VITALS — BP 128/70 | HR 56 | Temp 97.8°F | Resp 16 | Ht 69.0 in | Wt 205.0 lb

## 2015-01-09 DIAGNOSIS — M533 Sacrococcygeal disorders, not elsewhere classified: Secondary | ICD-10-CM | POA: Insufficient documentation

## 2015-01-09 DIAGNOSIS — Z9889 Other specified postprocedural states: Secondary | ICD-10-CM

## 2015-01-09 DIAGNOSIS — D751 Secondary polycythemia: Secondary | ICD-10-CM | POA: Insufficient documentation

## 2015-01-09 DIAGNOSIS — M7702 Medial epicondylitis, left elbow: Secondary | ICD-10-CM | POA: Diagnosis not present

## 2015-01-09 DIAGNOSIS — M5136 Other intervertebral disc degeneration, lumbar region: Secondary | ICD-10-CM | POA: Insufficient documentation

## 2015-01-09 DIAGNOSIS — M5030 Other cervical disc degeneration, unspecified cervical region: Secondary | ICD-10-CM | POA: Diagnosis not present

## 2015-01-09 DIAGNOSIS — M4806 Spinal stenosis, lumbar region: Secondary | ICD-10-CM | POA: Diagnosis not present

## 2015-01-09 DIAGNOSIS — M545 Low back pain: Secondary | ICD-10-CM | POA: Diagnosis present

## 2015-01-09 DIAGNOSIS — M7701 Medial epicondylitis, right elbow: Secondary | ICD-10-CM | POA: Diagnosis not present

## 2015-01-09 DIAGNOSIS — M79604 Pain in right leg: Secondary | ICD-10-CM | POA: Diagnosis present

## 2015-01-09 DIAGNOSIS — M62830 Muscle spasm of back: Secondary | ICD-10-CM | POA: Diagnosis not present

## 2015-01-09 DIAGNOSIS — M5416 Radiculopathy, lumbar region: Secondary | ICD-10-CM | POA: Insufficient documentation

## 2015-01-09 DIAGNOSIS — M79605 Pain in left leg: Secondary | ICD-10-CM | POA: Diagnosis present

## 2015-01-09 DIAGNOSIS — Z981 Arthrodesis status: Secondary | ICD-10-CM

## 2015-01-09 LAB — HEMATOCRIT: HCT: 44.8 % (ref 40.0–52.0)

## 2015-01-09 MED ORDER — OXYCODONE HCL 10 MG PO TABS: ORAL_TABLET | ORAL | Status: DC

## 2015-01-09 NOTE — Patient Instructions (Addendum)
Continue present medication oxycodone  Lumbosacral selective nerve root block and psoas compartment block to be performed Monday, 01/15/2015  F/U PCP Dr. Gayland Curry  for evaliation of  BP and general medical  condition  F/U surgical evaluation  F/U neurological evaluation  May consider radiofrequency rhizolysis or intraspinal procedures pending response to present treatment and F/U evaluation   Patient to call Pain Management Center should patient have concerns prior to scheduled return appointmen. Pain Management Discharge Instructions  General Discharge Instructions :  If you need to reach your doctor call: Monday-Friday 8:00 am - 4:00 pm at [phone] or toll free [phone].  After clinic hours [phone] to have operator reach doctor.  Bring all of your medication bottles to all your appointments in the pain clinic.  To cancel or reschedule your appointment with Pain Management please remember to call 24 hours in advance to avoid a fee.  Refer to the educational materials which you have been given on: General Risks, I had my Procedure. Discharge Instructions, Post Sedation.  Post Procedure Instructions:  The drugs you were given will stay in your system until tomorrow, so for the next 24 hours you should not drive, make any legal decisions or drink any alcoholic beverages.  You may eat anything you prefer, but it is better to start with liquids then soups and crackers, and gradually work up to solid foods.  Please notify your doctor immediately if you have any unusual bleeding, trouble breathing or pain that is not related to your normal pain.  Depending on the type of procedure that was done, some parts of your body may feel week and/or numb.  This usually clears up by tonight or the next day.  Walk with the use of an assistive device or accompanied by an adult for the 24 hours.  You may use ice on the affected area for the first 24 hours.  Put ice in a Ziploc  bag and cover with a towel and place against area 15 minutes on 15 minutes off.  You may switch to heat after 24 hours.GENERAL RISKS AND COMPLICATIONS  What are the risk, side effects and possible complications? Generally speaking, most procedures are safe.  However, with any procedure there are risks, side effects, and the possibility of complications.  The risks and complications are dependent upon the sites that are lesioned, or the type of nerve block to be performed.  The closer the procedure is to the spine, the more serious the risks are.  Great care is taken when placing the radio frequency needles, block needles or lesioning probes, but sometimes complications can occur. 1. Infection: Any time there is an injection through the skin, there is a risk of infection.  This is why sterile conditions are used for these blocks.  There are four possible types of infection. 1. Localized skin infection. 2. Central Nervous System Infection-This can be in the form of Meningitis, which can be deadly. 3. Epidural Infections-This can be in the form of an epidural abscess, which can cause pressure inside of the spine, causing compression of the spinal cord with subsequent paralysis. This would require an emergency surgery to decompress, and there are no guarantees that the patient would recover from the paralysis. 4. Discitis-This is an infection of the intervertebral discs.  It occurs in about 1% of discography procedures.  It is difficult to treat and it may lead to surgery.        2. Pain: the needles have to go through  skin and soft tissues, will cause soreness.       3. Damage to internal structures:  The nerves to be lesioned may be near blood vessels or    other nerves which can be potentially damaged.       4. Bleeding: Bleeding is more common if the patient is taking blood thinners such as  aspirin, Coumadin, Ticiid, Plavix, etc., or if he/she have some genetic predisposition  such as hemophilia.  Bleeding into the spinal canal can cause compression of the spinal  cord with subsequent paralysis.  This would require an emergency surgery to  decompress and there are no guarantees that the patient would recover from the  paralysis.       5. Pneumothorax:  Puncturing of a lung is a possibility, every time a needle is introduced in  the area of the chest or upper back.  Pneumothorax refers to free air around the  collapsed lung(s), inside of the thoracic cavity (chest cavity).  Another two possible  complications related to a similar event would include: Hemothorax and Chylothorax.   These are variations of the Pneumothorax, where instead of air around the collapsed  lung(s), you may have blood or chyle, respectively.       6. Spinal headaches: They may occur with any procedures in the area of the spine.       7. Persistent CSF (Cerebro-Spinal Fluid) leakage: This is a rare problem, but may occur  with prolonged intrathecal or epidural catheters either due to the formation of a fistulous  track or a dural tear.       8. Nerve damage: By working so close to the spinal cord, there is always a possibility of  nerve damage, which could be as serious as a permanent spinal cord injury with  paralysis.       9. Death:  Although rare, severe deadly allergic reactions known as "Anaphylactic  reaction" can occur to any of the medications used.      10. Worsening of the symptoms:  We can always make thing worse.  What are the chances of something like this happening? Chances of any of this occuring are extremely low.  By statistics, you have more of a chance of getting killed in a motor vehicle accident: while driving to the hospital than any of the above occurring .  Nevertheless, you should be aware that they are possibilities.  In general, it is similar to taking a shower.  Everybody knows that you can slip, hit your head and get killed.  Does that mean that you should not shower again?  Nevertheless always keep  in mind that statistics do not mean anything if you happen to be on the wrong side of them.  Even if a procedure has a 1 (one) in a 1,000,000 (million) chance of going wrong, it you happen to be that one..Also, keep in mind that by statistics, you have more of a chance of having something go wrong when taking medications.  Who should not have this procedure? If you are on a blood thinning medication (e.g. Coumadin, Plavix, see list of "Blood Thinners"), or if you have an active infection going on, you should not have the procedure.  If you are taking any blood thinners, please inform your physician.  How should I prepare for this procedure?  Do not eat or drink anything at least six hours prior to the procedure.  Bring a driver with you .  It cannot be a  taxi.  Come accompanied by an adult that can drive you back, and that is strong enough to help you if your legs get weak or numb from the local anesthetic.  Take all of your medicines the morning of the procedure with just enough water to swallow them.  If you have diabetes, make sure that you are scheduled to have your procedure done first thing in the morning, whenever possible.  If you have diabetes, take only half of your insulin dose and notify our nurse that you have done so as soon as you arrive at the clinic.  If you are diabetic, but only take blood sugar pills (oral hypoglycemic), then do not take them on the morning of your procedure.  You may take them after you have had the procedure.  Do not take aspirin or any aspirin-containing medications, at least eleven (11) days prior to the procedure.  They may prolong bleeding.  Wear loose fitting clothing that may be easy to take off and that you would not mind if it got stained with Betadine or blood.  Do not wear any jewelry or perfume  Remove any nail coloring.  It will interfere with some of our monitoring equipment.  NOTE: Remember that this is not meant to be interpreted as a  complete list of all possible complications.  Unforeseen problems may occur.  BLOOD THINNERS The following drugs contain aspirin or other products, which can cause increased bleeding during surgery and should not be taken for 2 weeks prior to and 1 week after surgery.  If you should need take something for relief of minor pain, you may take acetaminophen which is found in Tylenol,m Datril, Anacin-3 and Panadol. It is not blood thinner. The products listed below are.  Do not take any of the products listed below in addition to any listed on your instruction sheet.  A.P.C or A.P.C with Codeine Codeine Phosphate Capsules #3 Ibuprofen Ridaura  ABC compound Congesprin Imuran rimadil  Advil Cope Indocin Robaxisal  Alka-Seltzer Effervescent Pain Reliever and Antacid Coricidin or Coricidin-D  Indomethacin Rufen  Alka-Seltzer plus Cold Medicine Cosprin Ketoprofen S-A-C Tablets  Anacin Analgesic Tablets or Capsules Coumadin Korlgesic Salflex  Anacin Extra Strength Analgesic tablets or capsules CP-2 Tablets Lanoril Salicylate  Anaprox Cuprimine Capsules Levenox Salocol  Anexsia-D Dalteparin Magan Salsalate  Anodynos Darvon compound Magnesium Salicylate Sine-off  Ansaid Dasin Capsules Magsal Sodium Salicylate  Anturane Depen Capsules Marnal Soma  APF Arthritis pain formula Dewitt's Pills Measurin Stanback  Argesic Dia-Gesic Meclofenamic Sulfinpyrazone  Arthritis Bayer Timed Release Aspirin Diclofenac Meclomen Sulindac  Arthritis pain formula Anacin Dicumarol Medipren Supac  Analgesic (Safety coated) Arthralgen Diffunasal Mefanamic Suprofen  Arthritis Strength Bufferin Dihydrocodeine Mepro Compound Suprol  Arthropan liquid Dopirydamole Methcarbomol with Aspirin Synalgos  ASA tablets/Enseals Disalcid Micrainin Tagament  Ascriptin Doan's Midol Talwin  Ascriptin A/D Dolene Mobidin Tanderil  Ascriptin Extra Strength Dolobid Moblgesic Ticlid  Ascriptin with Codeine Doloprin or Doloprin with Codeine  Momentum Tolectin  Asperbuf Duoprin Mono-gesic Trendar  Aspergum Duradyne Motrin or Motrin IB Triminicin  Aspirin plain, buffered or enteric coated Durasal Myochrisine Trigesic  Aspirin Suppositories Easprin Nalfon Trillsate  Aspirin with Codeine Ecotrin Regular or Extra Strength Naprosyn Uracel  Atromid-S Efficin Naproxen Ursinus  Auranofin Capsules Elmiron Neocylate Vanquish  Axotal Emagrin Norgesic Verin  Azathioprine Empirin or Empirin with Codeine Normiflo Vitamin E  Azolid Emprazil Nuprin Voltaren  Bayer Aspirin plain, buffered or children's or timed BC Tablets or powders Encaprin Orgaran Warfarin Sodium  Buff-a-Comp Enoxaparin Orudis  Zorpin  Buff-a-Comp with Codeine Equegesic Os-Cal-Gesic   Buffaprin Excedrin plain, buffered or Extra Strength Oxalid   Bufferin Arthritis Strength Feldene Oxphenbutazone   Bufferin plain or Extra Strength Feldene Capsules Oxycodone with Aspirin   Bufferin with Codeine Fenoprofen Fenoprofen Pabalate or Pabalate-SF   Buffets II Flogesic Panagesic   Buffinol plain or Extra Strength Florinal or Florinal with Codeine Panwarfarin   Buf-Tabs Flurbiprofen Penicillamine   Butalbital Compound Four-way cold tablets Penicillin   Butazolidin Fragmin Pepto-Bismol   Carbenicillin Geminisyn Percodan   Carna Arthritis Reliever Geopen Persantine   Carprofen Gold's salt Persistin   Chloramphenicol Goody's Phenylbutazone   Chloromycetin Haltrain Piroxlcam   Clmetidine heparin Plaquenil   Cllnoril Hyco-pap Ponstel   Clofibrate Hydroxy chloroquine Propoxyphen         Before stopping any of these medications, be sure to consult the physician who ordered them.  Some, such as Coumadin (Warfarin) are ordered to prevent or treat serious conditions such as "deep thrombosis", "pumonary embolisms", and other heart problems.  The amount of time that you may need off of the medication may also vary with the medication and the reason for which you were taking it.  If you are  taking any of these medications, please make sure you notify your pain physician before you undergo any procedures.         Selective Nerve Root Block Patient Information  Description: Specific nerve roots exit the spinal canal and these nerves can be compressed and inflamed by a bulging disc and bone spurs.  By injecting steroids on the nerve root, we can potentially decrease the inflammation surrounding these nerves, which often leads to decreased pain.  Also, by injecting local anesthesia on the nerve root, this can provide [place] helpful information to give to your referring doctor if it decreases your pain.  Selective nerve root blocks can be done along the spine from the neck to the low back depending on the location of your pain.   After numbing the skin with local anesthesia, a small needle is passed to the nerve root and the position of the needle is verified using x-ray pictures.  After the needle is in correct position, we then deposit the medication.  You may experience a pressure sensation while this is being done.  The entire block usually lasts less than 15 minutes.  Conditions that may be treated with selective nerve root blocks:  Low back and leg pain  Spinal stenosis  Diagnostic block prior to potential surgery  Neck and arm pain  Post laminectomy syndrome  Preparation for the injection:  1. Do not eat any solid food or dairy products within 6 hours of your appointment. 2. You may drink clear liquids up to 2 hours before an appointment.  Clear liquids include water, black coffee, juice or soda.  No milk or cream please. 3. You may take your regular medications, including pain medications, with a sip of water before your appointment.  Diabetics should hold regular insulin (if taken separately) and take 1/2 normal NPH dose the morning of the procedure.  Carry some sugar containing items with you to your appointment. 4. A driver must accompany you and be prepared to drive  you home after your procedure. 5. Bring all your current medications with you. 6. An IV may be inserted and sedation may be given at the discretion of the physician. 7. A blood pressure cuff, EKG, and other monitors will often be applied during the procedure.  Some patients may  need to have extra oxygen administered for a short period. 8. You will be asked to provide medical information, including allergies, prior to the procedure.  We must know immediately if you are taking blood  Thinners (like Coumadin) or if you are allergic to IV iodine contrast (dye).  Possible side-effects: All are usually temporary  Bleeding from needle site  Light headedness  Numbness and tingling  Decreased blood pressure  Weakness in arms/legs  Pressure sensation in back/neck  Pain at injection site (several days)  Possible complications: All are extremely rare  Infection  Nerve injury  Spinal headache (a headache wore with upright position)  Call if you experience:  Fever/chills associated with headache or increased back/neck pain  Headache worsened by an upright position  New onset weakness or numbness of an extremity below the injection site  Hives or difficulty breathing (go to the emergency room)  Inflammation or drainage at the injection site(s)  Severe back/neck pain greater than usual  New symptoms which are concerning to you  Please note:  Although the local anesthetic injected can often make your back or neck feel good for several hours after the injection the pain will likely return.  It takes 3-5 days for steroids to work on the nerve root. You may not notice any pain relief for at least one week.  If effective, we will often do a series of 3 injections spaced 3-6 weeks apart to maximally decrease your pain.    If you have any questions, please call [phone] [facility]

## 2015-01-09 NOTE — Progress Notes (Signed)
Safety precautions to be maintained throughout the outpatient stay will include: orient to surroundings, keep bed in low position, maintain call bell within reach at all times, provide assistance with transfer out of bed and ambulation.  

## 2015-01-09 NOTE — Progress Notes (Signed)
Subjective:    Patient ID: [name], male    DOB: February 13, 1951, 64 y.o.   MRN: [id number]  HPI  Patient is 64 year old gentleman who [facility] evaluation and treatment of pain involving the lower back and lower extremity region. Patient is with significant lower extremity pain and spasms and is in hopes of being able to undergo interventional treatment in attempt to decrease severity of the spasms. Patient is status post surgery of the lumbar region with persistent pain of the lower back and lower extremity regions the right surgical intervention. We'll proceed with interventional treatment at time return appointment consisting of intercostal nerve blocks and psoas compartment block at time return appointment in attempt to decrease severity of symptoms, minimize progression of symptoms, and reduce the chances of need for further and more involved interventional treatment. The patient was understanding and agreed suggested treatment plan.    Review of Systems     Objective:   Physical Exam  There was tends to palpation of the splenius capitis and occipitalis musculature region with well-healed surgical scar of the cervical region without increased warmth or erythema in the region of the scar. Palpation over the region of the cervical facet cervical paraspinal musculature region was with mild to moderate discomfort. There was unremarkable Spurling's maneuver and decreased grip strength was noted. Tinel and Phalen's maneuver were without increase of pain of significant degree. There was tenderness to palpation of the left elbow with questionable effusion of the left elbow. Palpation of the right elbow was also of moderate degree. Patient was with tenderness to palpation over the thoracic facet thoracic paraspinal musculature region with significant muscle spasms of the lower thoracic paraspinal musculature region. No crepitus of the thoracic region was  noted. There was severe muscle spasms of the lumbar paraspinal musculature region lumbar facet region. Straight leg raising limited to 20 without increase of pain with dorsiflexion noted. There was negative clonus negative Homans. DTRs are difficult to elicit patient had difficulty relaxing. There was mild tenderness of the greater trochanteric region iliotibial band region. There was moderate tends to palpation over the PSIS and PII S region as well as the gluteal and piriformis musculature regions. EHL strength appeared to be decreased. There was evidence of severe muscle spasms of the thoracic or lumbar paraspinal musculature region left side greater than the right. There was negative clonus negative Homans. Abdomen was without excessive tenderness to palpation and no costovertebral angle tenderness was noted.      Assessment & Plan:     Degenerative disc disease lumbar spine Postoperative changes L3-S1, laminectomy, epidural scarring present anteriorly and thickening of epidural soft tissues no evidence of arachnoiditis. Severe neuroforaminal stenosis bilaterally, L5-S1 exiting nerve root impingement, hearing degrees with foraminal stenosis at other levels, adjacent facet degenerative joint disease and hypertrophy with possible synovitis L3-4 and L4-L5. No synovial effusions or abscesses  Lumbar radiculopathy  Psoas compartment syndrome  Lumbar stenosis with neurogenic claudication  Lumbar facet syndrome with severe muscle spasms  Degenerative disc disease cervical spine Status post surgery of the cervical region  DJD and epicondylitis of elbows  Sacroiliac joint dysfunction    Plan   Continue present medications oxycodone   Lumbosacral selective nerve root block /psoas compartment block to be performed at time return appointment  F/U PCP Dr. Gayland Curry for evaliation of  BP and general medical  condition.  F/U surgical evaluation Dr. Ellene Route and Dr. Bland Span to consider  CT myelogram  MRI or other studies   F/U Dr. Mardelle Matte for evaluation of elbows  F/U Dr.Wahl for GI follow-up evaluation   F/U neurological evaluation  May consider radiofrequency rhizolysis or intraspinal procedures pending response to present treatment and F/U evaluation.  Patient to call [facility] should patient have concerns prior to scheduled return appointment.

## 2015-01-09 NOTE — Progress Notes (Unsigned)
HCt 44.8. Will hold phlebotomy per MD order (hold if less than 48).

## 2015-01-15 ENCOUNTER — Encounter: Payer: Self-pay | Admitting: Pain Medicine

## 2015-01-15 ENCOUNTER — Ambulatory Visit: Payer: Medicare Other | Attending: Pain Medicine | Admitting: Pain Medicine

## 2015-01-15 VITALS — BP 150/92 | HR 45 | Temp 98.2°F | Resp 16 | Ht 69.0 in | Wt 205.0 lb

## 2015-01-15 DIAGNOSIS — Z9889 Other specified postprocedural states: Secondary | ICD-10-CM | POA: Insufficient documentation

## 2015-01-15 DIAGNOSIS — M545 Low back pain: Secondary | ICD-10-CM | POA: Diagnosis present

## 2015-01-15 DIAGNOSIS — M5136 Other intervertebral disc degeneration, lumbar region: Secondary | ICD-10-CM

## 2015-01-15 DIAGNOSIS — M533 Sacrococcygeal disorders, not elsewhere classified: Secondary | ICD-10-CM

## 2015-01-15 DIAGNOSIS — M79605 Pain in left leg: Secondary | ICD-10-CM | POA: Diagnosis present

## 2015-01-15 DIAGNOSIS — M5030 Other cervical disc degeneration, unspecified cervical region: Secondary | ICD-10-CM

## 2015-01-15 DIAGNOSIS — M79604 Pain in right leg: Secondary | ICD-10-CM | POA: Diagnosis present

## 2015-01-15 DIAGNOSIS — Z981 Arthrodesis status: Secondary | ICD-10-CM

## 2015-01-15 MED ORDER — ORPHENADRINE CITRATE 30 MG/ML IJ SOLN
INTRAMUSCULAR | Status: AC
  Administered 2015-01-15: 11:00:00
  Filled 2015-01-15: qty 2

## 2015-01-15 MED ORDER — TRIAMCINOLONE ACETONIDE 40 MG/ML IJ SUSP
INTRAMUSCULAR | Status: AC
Start: 2015-01-15 — End: 2015-01-15
  Administered 2015-01-15: 11:00:00
  Filled 2015-01-15: qty 1

## 2015-01-15 MED ORDER — MIDAZOLAM HCL 5 MG/5ML IJ SOLN
INTRAMUSCULAR | Status: AC
Start: 2015-01-15 — End: 2015-01-15
  Administered 2015-01-15: 11:00:00 via INTRAVENOUS
  Filled 2015-01-15: qty 5

## 2015-01-15 MED ORDER — CEFUROXIME AXETIL 250 MG PO TABS: 250.0000 mg | ORAL_TABLET | Freq: Two times a day (BID) | ORAL | Status: DC

## 2015-01-15 MED ORDER — FENTANYL CITRATE (PF) 100 MCG/2ML IJ SOLN
INTRAMUSCULAR | Status: AC
  Administered 2015-01-15: 100 ug via INTRAVENOUS
  Filled 2015-01-15: qty 2

## 2015-01-15 MED ORDER — BUPIVACAINE HCL (PF) 0.25 % IJ SOLN
INTRAMUSCULAR | Status: AC
  Administered 2015-01-15: 11:00:00
  Filled 2015-01-15: qty 30

## 2015-01-15 MED ORDER — CEFAZOLIN SODIUM 1 G IJ SOLR
INTRAMUSCULAR | Status: AC
Start: 2015-01-15 — End: 2015-01-15
  Administered 2015-01-15: 1 g
  Filled 2015-01-15: qty 10

## 2015-01-15 NOTE — Progress Notes (Signed)
Subjective:    Patient ID: [name], male    DOB: May 08, 1951, 64 y.o.   MRN: [id number]  HPI  PROCEDURE PERFORMED: Lumbosacral selective nerve root block   NOTE: The patient is a 64 y.o. male who returns to [facility] for further evaluation and treatment of pain involving the lumbar and lower extremity region. Studies consisting of MRI has revealed the patient to be with evidence of degenerative disc disease lumbar spine. Postoperative changes L3-S1, laminectomy, epidural scarring present anteriorly and thickening of epidural soft tissues no evidence of arachnoiditis. Severe neuroforaminal stenosis bilaterally, L5-S1 exiting nerve root impingement, hearing degrees with foraminal stenosis at other levels, adjacent facet degenerative joint disease and hypertrophy with possible synovitis L3-4 and L4-L5. No synovial effusions or abscesses. There is concern regarding intraspinal abnormalities contributing to the patient's symptomatology. The risks, benefits, and expectations of the procedure have been explained to the patient who was understanding and in agreement with suggested treatment plan. We will proceed with interventional treatment as discussed and as explained to the patient. The patient is understanding and in agreement with suggested treatment plan.   DESCRIPTION OF PROCEDURE: Lumbosacral selective nerve root block with IV Versed, IV fentanyl conscious sedation, EKG, blood pressure, pulse, and pulse oximetry monitoring. The procedure was performed with the patient in the prone position under fluoroscopic guidance. With the patient in the prone position, Betadine prep of proposed entry site was performed. Local anesthetic skin wheal of proposed needle entry site was prepared with 1.5% plain lidocaine with AP view of the lumbosacral spine.   PROCEDURE #1: Needle placement at the left L 2 vertebral body: A 22 -gauge needle was inserted at the inferior border of the transverse  process of the vertebral body with needle placed medial to the midline of the transverse process on AP view of the lumbosacral spine.   NEEDLE PLACEMENT AT LEFT L3, L4, and L5  VERTEBRAL BODY LEVELS  Needle  placement was accomplished at L3, L4, and L5 vertebral body levels  on the left side exactly as was accomplished at the L2 vertebral body level level utilizing the same technique and under fluoroscopic guidance.   Needle placement was then verified on lateral view at all levels with needle tip documented to be in the posterior superior quadrant of the intervertebral foramen of  L 2, L3, L4, and L5. Following negative aspiration for heme and CSF at each level, each level was injected with 3 mL of 0.25% bupivacaine with Kenalog.   Myoneural block injection of the lumbar paraspinal musculature region. Following Betadine prep of proposed entry site a 22-gauge needle was inserted in the paraspinal musculature region of the lumbar region on the left following negative aspiration for cc of quarter percent bupivacaine with Norflex was injected for myoneural block injection    The patient tolerated the procedure well. A total of 10 mg of Kenalog was utilized for the procedure.   PLAN:  1. Medications: Will continue presently prescribed medication oxycodone. 2. The patient is to undergo follow-up evaluation with PCPDr. Gayland Curry  for evaluation of blood pressure and general medical condition status post procedure performed on today's visit. 3. Surgical follow-up evaluation with Dr. Ellene Route and Dr. Bland Span. 4. Neurological evaluation, urological evaluation, and hematological evaluation as planned  5. May consider radiofrequency procedures, implantation type procedures and other treatment pending response to treatment and follow-up evaluation. 6. The patient has been advise do adhere to proper body mechanics and avoid activities which may aggravate  condition. 7. The patient has been advised to  call the [facility] prior to scheduled return appointment should there be significant change in the patient's condition or should the patient have other concerns regarding condition prior to scheduled return appointment.     Review of Systems     Objective:   Physical Exam        Assessment & Plan:

## 2015-01-15 NOTE — Patient Instructions (Addendum)
Continue present medications and antibiotics. Please obtain your antibiotic Ceftin today and begin taking antibiotic today  F/U PCP Dr. Gayland Curry for evaliation of  BP and general medical  condition.  F/U surgical evaluation as discussed with Dr. Ellene Route and Dr. Bland Span  F/U neurological evaluation.  May consider radiofrequency rhizolysis or intraspinal procedures pending response to present treatment and F/U evaluation.  Patient to call [facility] should patient have concerns prior to scheduled return appointment.  Pain Management Discharge Instructions  General Discharge Instructions :  If you need to reach your doctor call: Monday-Friday 8:00 am - 4:00 pm at [phone] or toll free [phone].  After clinic hours [phone] to have operator reach doctor.  Bring all of your medication bottles to all your appointments in the pain clinic.  To cancel or reschedule your appointment with Pain Management please remember to call 24 hours in advance to avoid a fee.  Refer to the educational materials which you have been given on: General Risks, I had my Procedure. Discharge Instructions, Post Sedation.  Post Procedure Instructions:  The drugs you were given will stay in your system until tomorrow, so for the next 24 hours you should not drive, make any legal decisions or drink any alcoholic beverages.  You may eat anything you prefer, but it is better to start with liquids then soups and crackers, and gradually work up to solid foods.  Please notify your doctor immediately if you have any unusual bleeding, trouble breathing or pain that is not related to your normal pain.  Depending on the type of procedure that was done, some parts of your body may feel week and/or numb.  This usually clears up by tonight or the next day.  Walk with the use of an assistive device or accompanied by an adult for the 24 hours.  You may use ice on the affected area for the first 24  hours.  Put ice in a Ziploc bag and cover with a towel and place against area 15 minutes on 15 minutes off.  You may switch to heat after 24 hours.Selective Nerve Root Block Patient Information  Description: Specific nerve roots exit the spinal canal and these nerves can be compressed and inflamed by a bulging disc and bone spurs.  By injecting steroids on the nerve root, we can potentially decrease the inflammation surrounding these nerves, which often leads to decreased pain.  Also, by injecting local anesthesia on the nerve root, this can provide [place] helpful information to give to your referring doctor if it decreases your pain.  Selective nerve root blocks can be done along the spine from the neck to the low back depending on the location of your pain.   After numbing the skin with local anesthesia, a small needle is passed to the nerve root and the position of the needle is verified using x-ray pictures.  After the needle is in correct position, we then deposit the medication.  You may experience a pressure sensation while this is being done.  The entire block usually lasts less than 15 minutes.  Conditions that may be treated with selective nerve root blocks:  Low back and leg pain  Spinal stenosis  Diagnostic block prior to potential surgery  Neck and arm pain  Post laminectomy syndrome  Preparation for the injection:  1. Do not eat any solid food or dairy products within 6 hours of your appointment. 2. You may drink clear liquids up to 2 hours before an appointment.  Clear liquids include water, black coffee, juice or soda.  No milk or cream please. 3. You may take your regular medications, including pain medications, with a sip of water before your appointment.  Diabetics should hold regular insulin (if taken separately) and take 1/2 normal NPH dose the morning of the procedure.  Carry some sugar containing items with you to your appointment. 4. A driver must accompany you and be  prepared to drive you home after your procedure. 5. Bring all your current medications with you. 6. An IV may be inserted and sedation may be given at the discretion of the physician. 7. A blood pressure cuff, EKG, and other monitors will often be applied during the procedure.  Some patients may need to have extra oxygen administered for a short period. 8. You will be asked to provide medical information, including allergies, prior to the procedure.  We must know immediately if you are taking blood  Thinners (like Coumadin) or if you are allergic to IV iodine contrast (dye).  Possible side-effects: All are usually temporary  Bleeding from needle site  Light headedness  Numbness and tingling  Decreased blood pressure  Weakness in arms/legs  Pressure sensation in back/neck  Pain at injection site (several days)  Possible complications: All are extremely rare  Infection  Nerve injury  Spinal headache (a headache wore with upright position)  Call if you experience:  Fever/chills associated with headache or increased back/neck pain  Headache worsened by an upright position  New onset weakness or numbness of an extremity below the injection site  Hives or difficulty breathing (go to the emergency room)  Inflammation or drainage at the injection site(s)  Severe back/neck pain greater than usual  New symptoms which are concerning to you  Please note:  Although the local anesthetic injected can often make your back or neck feel good for several hours after the injection the pain will likely return.  It takes 3-5 days for steroids to work on the nerve root. You may not notice any pain relief for at least one week.  If effective, we will often do a series of 3 injections spaced 3-6 weeks apart to maximally decrease your pain.    If you have any questions, please call [phone] [facility] Antibiotic at pharmacy to be picked up

## 2015-01-15 NOTE — Progress Notes (Signed)
Safety precautions to be maintained throughout the outpatient stay will include: orient to surroundings, keep bed in low position, maintain call bell within reach at all times, provide assistance with transfer out of bed and ambulation.  

## 2015-01-16 ENCOUNTER — Telehealth: Payer: Self-pay | Admitting: *Deleted

## 2015-01-16 NOTE — Telephone Encounter (Signed)
No problems post procedure. 

## 2015-01-22 ENCOUNTER — Other Ambulatory Visit: Payer: Self-pay | Admitting: Pain Medicine

## 2015-01-23 DIAGNOSIS — M71122 Other infective bursitis, left elbow: Secondary | ICD-10-CM | POA: Insufficient documentation

## 2015-02-08 ENCOUNTER — Ambulatory Visit: Payer: Medicare Other | Attending: Pain Medicine | Admitting: Pain Medicine

## 2015-02-08 ENCOUNTER — Encounter: Payer: Self-pay | Admitting: Pain Medicine

## 2015-02-08 VITALS — BP 137/85 | HR 61 | Temp 98.0°F | Resp 18 | Ht 69.0 in | Wt 205.0 lb

## 2015-02-08 DIAGNOSIS — M62830 Muscle spasm of back: Secondary | ICD-10-CM | POA: Insufficient documentation

## 2015-02-08 DIAGNOSIS — M533 Sacrococcygeal disorders, not elsewhere classified: Secondary | ICD-10-CM

## 2015-02-08 DIAGNOSIS — M4806 Spinal stenosis, lumbar region: Secondary | ICD-10-CM | POA: Insufficient documentation

## 2015-02-08 DIAGNOSIS — Z9889 Other specified postprocedural states: Secondary | ICD-10-CM | POA: Diagnosis not present

## 2015-02-08 DIAGNOSIS — M5136 Other intervertebral disc degeneration, lumbar region: Secondary | ICD-10-CM | POA: Diagnosis not present

## 2015-02-08 DIAGNOSIS — M19029 Primary osteoarthritis, unspecified elbow: Secondary | ICD-10-CM | POA: Insufficient documentation

## 2015-02-08 DIAGNOSIS — M5030 Other cervical disc degeneration, unspecified cervical region: Secondary | ICD-10-CM | POA: Diagnosis not present

## 2015-02-08 DIAGNOSIS — M545 Low back pain: Secondary | ICD-10-CM | POA: Diagnosis present

## 2015-02-08 DIAGNOSIS — M5416 Radiculopathy, lumbar region: Secondary | ICD-10-CM | POA: Diagnosis not present

## 2015-02-08 DIAGNOSIS — M542 Cervicalgia: Secondary | ICD-10-CM | POA: Diagnosis present

## 2015-02-08 DIAGNOSIS — Z981 Arthrodesis status: Secondary | ICD-10-CM

## 2015-02-08 MED ORDER — OXYCODONE HCL 10 MG PO TABS
ORAL_TABLET | ORAL | Status: DC
Start: 2015-02-08 — End: 2015-03-12

## 2015-02-08 NOTE — Progress Notes (Signed)
Safety precautions to be maintained throughout the outpatient stay will include: orient to surroundings, keep bed in low position, maintain call bell within reach at all times, provide assistance with transfer out of bed and ambulation. Pt to have left elbow surgery next week.

## 2015-02-08 NOTE — Progress Notes (Signed)
UDS report on chart.  Dr Primus Bravo notified

## 2015-02-08 NOTE — Progress Notes (Signed)
Subjective:    Patient ID: [name], male    DOB: April 10, 1951, 64 y.o.   MRN: [id number]  HPI  Patient 64 year old gentleman [facility] for further evaluation and treatment of pain involving the neck upper extremity regions as well as the lower back and lower extremity regions. Patient stated that he had excellent relief of pain following his previous procedure lumbosacral selective nerve root block with injections in the region of the psoas muscle as well. Present time patient is planning surgery of the left elbow. We will avoid interventional treatment at this time. Patient will inform us when his surgeon will allow patient to have another lumbosacral selective nerve root block with injections in the region of the psoas muscle. Patient was quite anxious to repeat the procedure. Patient stated the procedure provided him with excellent relief of pain and that he felt better than he had felt in years following the procedure    Review of Systems     Objective:   Physical Exam  There was tenderness of the splenius capitis and occipitalis musculature regions. Palpation of these regions reproduced moderate discomfort. There was well-healed surgical scar the cervical region without increased warmth or erythema in the region scar. There appeared to be decreased grip strength on the left compared to the right. Patient was with nodules of the left elbow with or without definite erythema noted. Was tends to palpation in the region of the epicondyles of the elbow on the left both medial and lateral epicondyles with tends to palpation. Tinel and Phalen's maneuver were without definite increased pain There was tenderness over the thoracic facet thoracic paraspinal musculature region without crepitus of the thoracic region. Palpation over the lumbar paraspinal musculature region lumbar facet region was with moderate discomfort. Lateral bending and rotation reproduce moderate  discomfort. There was tenderness over the PSIS and PII S region of mild to moderate degree. Mild tenderness of the greater trochanteric region iliotibial band region straight leg raising limited to approximately 20 without increased pain with dorsiflexion noted. EHL strength appeared to be slightly decreased and there was questionably decreased sensation of the L5 dermatomal distribution. There was negative clonus negative Homans. Abdomen soft nontender and no costovertebral angle tenderness noted.    Assessment & Plan:    Degenerative disc disease lumbar spine Postoperative changes L3-S1, laminectomy, epidural scarring present anteriorly and thickening of epidural soft tissues no evidence of arachnoiditis. Severe neuroforaminal stenosis bilaterally, L5-S1 exiting nerve root impingement, hearing degrees with foraminal stenosis at other levels, adjacent facet degenerative joint disease and hypertrophy with possible synovitis L3-4 and L4-L5. No synovial effusions or abscesses  Lumbar radiculopathy  Psoas compartment syndrome  Lumbar stenosis with neurogenic claudication  Lumbar facet syndrome with severe muscle spasms  Degenerative disc disease cervical spine Status post surgery of the cervical region  DJD of elbow was   PLAN   Continue present medication oxycodone  We will consider repeating the lumbosacral selective nerve root block with injections in the psoas muscle when patient is allowed to undergo procedure. At the present time we will await the decision of patient's surgeon regarding additional surgery of patient and we'll proceed with interventional treatment once we have clearance to proceed with interventional treatment lumbosacral selective nerve block and injection in the region of the psoas muscle provided excellent relief of patient's lumbar and lower extremity pain  F/U PCP Dr. Gayland Curry for evaliation of  BP and general medical  condition  F/U surgical  evaluation. Patient will follow-up with Dr. Arnoldo Morale as discussed. Patient also is to undergo follow-up surgical evaluation of the left elbow with plans for surgery of the left elbow  F/U neurological evaluation. May consider pending follow-up evaluations  May consider radiofrequency rhizolysis or intraspinal procedures pending response to present treatment and F/U evaluation   Patient to call [facility] should patient have concerns prior to scheduled return appointment.

## 2015-02-08 NOTE — Patient Instructions (Signed)
Continue present medication oxycodone  F/U PCP Dr. Antony Odea for evaliation of  BP and general medical  condition  F/U surgical evaluation. May consider pending follow-up evaluations  Surgery of the elbow as planned  F/U neurological evaluation. May consider pending follow-up evaluations  May consider radiofrequency rhizolysis or intraspinal procedures pending response to present treatment and F/U evaluation   Patient to call [facility] should patient have concerns prior to scheduled return appointment.

## 2015-02-08 NOTE — Progress Notes (Signed)
Discharged to home ambulatory with script on hand for oxycodone.  Return in 1 mointh.

## 2015-02-13 ENCOUNTER — Encounter: Payer: Self-pay | Admitting: *Deleted

## 2015-02-16 ENCOUNTER — Ambulatory Visit: Payer: Medicare Other | Admitting: Anesthesiology

## 2015-02-16 ENCOUNTER — Encounter
Admission: RE | Disposition: A | Discharge status: Home / Self Care | Payer: Self-pay | Source: Ambulatory Visit | Attending: Unknown Physician Specialty

## 2015-02-16 ENCOUNTER — Ambulatory Visit
Admission: RE | Admit: 2015-02-16 | Discharge: 2015-02-16 | Disposition: A | Discharge status: Home / Self Care | Payer: Medicare Other | Source: Ambulatory Visit | Attending: Unknown Physician Specialty | Admitting: Unknown Physician Specialty

## 2015-02-16 DIAGNOSIS — M71122 Other infective bursitis, left elbow: Secondary | ICD-10-CM | POA: Insufficient documentation

## 2015-02-16 DIAGNOSIS — Z791 Long term (current) use of non-steroidal anti-inflammatories (NSAID): Secondary | ICD-10-CM | POA: Insufficient documentation

## 2015-02-16 DIAGNOSIS — Z85828 Personal history of other malignant neoplasm of skin: Secondary | ICD-10-CM | POA: Diagnosis not present

## 2015-02-16 DIAGNOSIS — I10 Essential (primary) hypertension: Secondary | ICD-10-CM | POA: Insufficient documentation

## 2015-02-16 DIAGNOSIS — G4730 Sleep apnea, unspecified: Secondary | ICD-10-CM | POA: Diagnosis not present

## 2015-02-16 DIAGNOSIS — Z8619 Personal history of other infectious and parasitic diseases: Secondary | ICD-10-CM | POA: Insufficient documentation

## 2015-02-16 DIAGNOSIS — M109 Gout, unspecified: Secondary | ICD-10-CM | POA: Diagnosis not present

## 2015-02-16 DIAGNOSIS — Z79899 Other long term (current) drug therapy: Secondary | ICD-10-CM | POA: Diagnosis not present

## 2015-02-16 DIAGNOSIS — M4120 Other idiopathic scoliosis, site unspecified: Secondary | ICD-10-CM | POA: Insufficient documentation

## 2015-02-16 DIAGNOSIS — F419 Anxiety disorder, unspecified: Secondary | ICD-10-CM | POA: Insufficient documentation

## 2015-02-16 DIAGNOSIS — K219 Gastro-esophageal reflux disease without esophagitis: Secondary | ICD-10-CM | POA: Insufficient documentation

## 2015-02-16 DIAGNOSIS — G629 Polyneuropathy, unspecified: Secondary | ICD-10-CM | POA: Diagnosis not present

## 2015-02-16 DIAGNOSIS — M1990 Unspecified osteoarthritis, unspecified site: Secondary | ICD-10-CM | POA: Diagnosis not present

## 2015-02-16 HISTORY — PX: MASS EXCISION: SHX2000

## 2015-02-16 SURGERY — MINOR EXCISION OF MASS
Anesthesia: Regional | Laterality: Left | Wound class: Clean

## 2015-02-16 SURGERY — MINOR EXCISION OF MASS
Anesthesia: Choice | Laterality: Right

## 2015-02-16 MED ORDER — LIDOCAINE HCL (CARDIAC) 20 MG/ML IV SOLN
INTRAVENOUS | Status: DC | PRN
  Administered 2015-02-16: 40 mg via INTRAVENOUS

## 2015-02-16 MED ORDER — OXYCODONE HCL 5 MG/5ML PO SOLN: 5.0000 mg | Freq: Once | ORAL | Status: DC | PRN

## 2015-02-16 MED ORDER — MIDAZOLAM HCL 2 MG/2ML IJ SOLN
INTRAMUSCULAR | Status: DC | PRN
  Administered 2015-02-16: 2 mg via INTRAVENOUS

## 2015-02-16 MED ORDER — ROPIVACAINE HCL 5 MG/ML IJ SOLN
INTRAMUSCULAR | Status: DC | PRN
  Administered 2015-02-16 (×2): 20 mL via PERINEURAL

## 2015-02-16 MED ORDER — CEFAZOLIN SODIUM-DEXTROSE 2-3 GM-% IV SOLR
INTRAVENOUS | Status: DC | PRN
  Administered 2015-02-16: 2 g via INTRAVENOUS

## 2015-02-16 MED ORDER — OXYCODONE HCL 5 MG PO TABS: 5.0000 mg | ORAL_TABLET | Freq: Once | ORAL | Status: DC | PRN

## 2015-02-16 MED ORDER — OXYCODONE HCL 10 MG PO TABS: 10.0000 mg | ORAL_TABLET | Freq: Four times a day (QID) | ORAL | Status: DC | PRN

## 2015-02-16 MED ORDER — PROPOFOL 10 MG/ML IV BOLUS
INTRAVENOUS | Status: DC | PRN
  Administered 2015-02-16: 50 mg via INTRAVENOUS

## 2015-02-16 MED ORDER — PROPOFOL INFUSION 10 MG/ML OPTIME
INTRAVENOUS | Status: DC | PRN
Start: 2015-02-16 — End: 2015-02-16
  Administered 2015-02-16: 50 ug/kg/min via INTRAVENOUS

## 2015-02-16 MED ORDER — LACTATED RINGERS IV SOLN
INTRAVENOUS | Status: DC
  Administered 2015-02-16: 12:00:00 via INTRAVENOUS

## 2015-02-16 MED ORDER — FENTANYL CITRATE (PF) 100 MCG/2ML IJ SOLN
INTRAMUSCULAR | Status: DC | PRN
  Administered 2015-02-16: 100 ug via INTRAVENOUS

## 2015-02-16 SURGICAL SUPPLY — 55 items
ADHESIVE MASTISOL STRL (MISCELLANEOUS) IMPLANT
BLADE SURG 15 STRL LF DISP TIS (BLADE) ×3 IMPLANT
BLADE SURG 15 STRL SS (BLADE) ×6
BNDG GAUZE 4.5X4.1 6PLY STRL (MISCELLANEOUS) IMPLANT
CANISTER SUCT 1200ML W/VALVE (MISCELLANEOUS) ×3 IMPLANT
CLOSURE WOUND 1/2 X4 (GAUZE/BANDAGES/DRESSINGS)
CLOSURE WOUND 1/4X4 (GAUZE/BANDAGES/DRESSINGS)
COVER LIGHT HANDLE FLEXIBLE (MISCELLANEOUS) ×3 IMPLANT
DRAIN PENROSE 1/4X12 LTX (DRAIN) ×3 IMPLANT
DRESSING TELFA 4X3 1S ST N-ADH (GAUZE/BANDAGES/DRESSINGS) IMPLANT
DURAPREP 26ML APPLICATOR (WOUND CARE) ×3 IMPLANT
GAUZE PETRO XEROFOAM 5X9 (MISCELLANEOUS) ×3 IMPLANT
GAUZE SPONGE 4X4 12PLY STRL (GAUZE/BANDAGES/DRESSINGS) ×3 IMPLANT
GAUZE STRETCH 2X75IN STRL (MISCELLANEOUS) IMPLANT
GLOVE BIO SURGEON STRL SZ7.5 (GLOVE) ×3 IMPLANT
GLOVE BIO SURGEON STRL SZ8 (GLOVE) ×6 IMPLANT
GLOVE INDICATOR 8.0 STRL GRN (GLOVE) ×3 IMPLANT
GOWN STRL REUS W/ TWL LRG LVL3 (GOWN DISPOSABLE) ×2 IMPLANT
GOWN STRL REUS W/TWL LRG LVL3 (GOWN DISPOSABLE) ×4
NS IRRIG 500ML POUR BTL (IV SOLUTION) ×3 IMPLANT
PACK EXTREMITY ARMC (MISCELLANEOUS) ×3 IMPLANT
PAD GROUND ADULT SPLIT (MISCELLANEOUS) ×3 IMPLANT
PADDING CAST 3IN STRL (MISCELLANEOUS)
PADDING CAST BLEND 3X4 NS (MISCELLANEOUS) ×1 IMPLANT
PADDING CAST BLEND 3X4 STRL (MISCELLANEOUS) IMPLANT
PADDING CAST BLEND 3X4YD NS (MISCELLANEOUS) ×2
SLING ARM LRG DEEP (SOFTGOODS) ×3 IMPLANT
SPLINT CAST 1 STEP 5X30 WHT (MISCELLANEOUS) ×3 IMPLANT
SPONGE LAP 18X18 5 PK (GAUZE/BANDAGES/DRESSINGS) IMPLANT
SPONGE XRAY 4X4 16PLY STRL (MISCELLANEOUS) IMPLANT
STOCKINETTE STRL 6IN 960660 (GAUZE/BANDAGES/DRESSINGS) IMPLANT
STRIP CLOSURE SKIN 1/2X4 (GAUZE/BANDAGES/DRESSINGS) IMPLANT
STRIP CLOSURE SKIN 1/4X4 (GAUZE/BANDAGES/DRESSINGS) IMPLANT
SUT ETHILON 2 0  PS2 NEEDLE (SUTURE)
SUT ETHILON 2 0 FSLX (SUTURE) IMPLANT
SUT ETHILON 2 0 PS2 NEEDLE (SUTURE) IMPLANT
SUT ETHILON 3-0 FS-10 30 BLK (SUTURE) ×3
SUT ETHILON 4-0 (SUTURE)
SUT ETHILON 4-0 FS2 18XMFL BLK (SUTURE)
SUT MNCRL 4-0 (SUTURE)
SUT MNCRL 4-0 27XMFL (SUTURE)
SUT MNCRL 5-0+ PC-1 (SUTURE) IMPLANT
SUT MONOCRYL 5-0 (SUTURE)
SUT TICRON 2-0 30IN 311381 (SUTURE) ×3 IMPLANT
SUT VIC AB 2-0 SH 27 (SUTURE) ×2
SUT VIC AB 2-0 SH 27XBRD (SUTURE) ×1 IMPLANT
SUT VIC AB 3-0 SH 27 (SUTURE)
SUT VIC AB 3-0 SH 27X BRD (SUTURE) IMPLANT
SUT VIC AB 4-0 RB1 27 (SUTURE)
SUT VIC AB 4-0 RB1 27X BRD (SUTURE) IMPLANT
SUTURE EHLN 3-0 FS-10 30 BLK (SUTURE) ×1 IMPLANT
SUTURE ETHLN 4-0 FS2 18XMF BLK (SUTURE) IMPLANT
SUTURE MNCRL 4-0 27XMF (SUTURE) IMPLANT
SYR 50ML LL SCALE MARK (SYRINGE) IMPLANT
TAPE MICROFOAM 4IN (TAPE) IMPLANT

## 2015-02-16 NOTE — H&P (Signed)
  H and P reviewed. No changes. Uploaded at later date. 

## 2015-02-16 NOTE — Anesthesia Postprocedure Evaluation (Signed)
  Anesthesia Post-op Note  Patient: [name]  Procedure(s) Performed: Procedure(s): LEFT BURSA ELBOW EXCISION (Left)  Anesthesia type:Regional  Patient location: PACU  Post pain: Pain level controlled  Post assessment: Post-op Vital signs reviewed, Patient's Cardiovascular Status Stable, Respiratory Function Stable, Patent Airway and No signs of Nausea or vomiting  Post vital signs: Reviewed and stable  Last Vitals:  Filed Vitals:   02/16/15 1400  BP: 116/76  Pulse: 49  Temp:   Resp: 16    Level of consciousness: awake, alert  and patient cooperative  Complications: No apparent anesthesia complications

## 2015-02-16 NOTE — Op Note (Signed)
02/16/2015  2:29 PM  PATIENT:  [name]  64 y.o. 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 The bursa had grown out staph aureus. Recently he had been taking doxycycline  since the organism was sensitive to this medication. The bursa had decreased in size but he was left with a nodular area that was painful.  OP NOTE: The patient had a supraclavicular block on this left upper extremity in the preop area. He was then taken to the operating room where he was placed in the lateral decubitus position with the left shoulde up . The patient was administered IV sedation  Next the left elbow was prepped and draped in usual fashion for an operative procedure about the elbow. The patient was given 2 g of Kefzol IV prior to the start of the procedure. I then exsanguinated the left upper extremity and then inflated the tourniquet that had initially been placed on the left upper arm. The tourniquet was inflated to 250 mm of pressure.  I then made a transverse incision over the midportion of the thickened bursal tissue I then bluntly and sharply dissected down to the bursal sac. I was able to essentially dissect out the bursal sac in its entirety. The central core of the bursa was reasonably solid except for a small cavity. No fluid was obtained from the thickened bursal tissue. The thickened bursal tissue was excised in its entirety. I also excised a few bony ossicles from the tip of the olecranon. I then repaired a portion of the distal triceps attachment with 2-0 Vicryl sutures. It was through this area that we of resected  the bony fragments from the tip of the olecranon. Next I irrigated the wound with saline. I then placed a single 2-0 Vicryl suture in the remaining wound cavity to bring the subcutaneous tissue down to the olecranon. The skin was repaired with 3-0 nylon sutures in vertical mattress fashion. Prior to placing the last suture in the skin I did insert a small Penrose drain into the wound. Betadine was applied the wound followed by a sterile dressing and a posterior fiberglass splint with the elbow flexed about 90. The patient was then awakened and transferred to a stretcher bed. He was taken to the recovery room in satisfactory condition.  I did swab the bursal tissue in order to get a Gram stain along with  aerobic and anaerobic culture and sensitivity. The thickened bursal tissue was then sent to pathology for histologic examination.

## 2015-02-16 NOTE — Anesthesia Procedure Notes (Addendum)
Anesthesia Regional Block:  Supraclavicular block  Pre-Anesthetic Checklist: ,, timeout performed, Correct Patient, Correct Site, Correct Laterality, Correct Procedure, Correct Position, site marked, Risks and benefits discussed,  Surgical consent,  Pre-op evaluation,  At surgeon's request and post-op pain management  Laterality: Left  Prep: chloraprep       Needles:  Injection technique: Single-shot  Needle Type: Stimiplex      Needle Gauge: 21 and 21 G    Additional Needles:  Procedures: ultrasound guided (picture in chart) and nerve stimulator Supraclavicular block  Nerve Stimulator or Paresthesia:  Response: bicep contraction, 0.45 mA,   Additional Responses:   Narrative:  Start time: 02/16/2015 11:39 AM End time: 02/16/2015 11:49 AM Injection made incrementally with aspirations every 5 mL.  Performed by: Personally  Anesthesiologist: Fidel Levy  Additional Notes: Functioning IV was confirmed and monitors applied.  Sterile prep and drape,hand hygiene and sterile gloves were used.Ultrasound guidance: relevant anatomy identified, needle position confirmed, local anesthetic spread visualized around nerve(s)., vascular puncture avoided.  Image printed for medical record.  Negative aspiration and negative test dose prior to incremental administration of local anesthetic. The patient tolerated the procedure well. Vitals signes recorded in RN notes.   Procedure Name: MAC Performed by: Londell Moh Pre-anesthesia Checklist: Patient identified, Emergency Drugs available, Suction available, Timeout performed and Patient being monitored Patient Re-evaluated:Patient Re-evaluated prior to inductionOxygen Delivery Method: Nasal cannula Placement Confirmation: positive ETCO2

## 2015-02-16 NOTE — Anesthesia Preprocedure Evaluation (Signed)
Anesthesia Evaluation  Patient identified by MRN, date of birth, ID band  Reviewed: Allergy & Precautions, H&P , NPO status , Patient's Chart, lab work & pertinent test results  History of Anesthesia Complications Negative for: history of anesthetic complications  Airway Mallampati: II  TM Distance: >3 FB Neck ROM: full   Comment: Neck fusion ; FROM Dental no notable dental hx.    Pulmonary neg pulmonary ROS, sleep apnea ,    Pulmonary exam normal        Cardiovascular Exercise Tolerance: Good hypertension, Normal cardiovascular exam Rhythm:regular Rate:Normal     Neuro/Psych Anxiety Neuropathy feet;  Chronic pain  negative psych ROS   GI/Hepatic GERD  Controlled,(+) Hepatitis - (cured by [facility] tx in 11/2014), C  Endo/Other  negative endocrine ROS  Renal/GU negative Renal ROS   bph    Musculoskeletal  (+) Arthritis , DDD, back pain Idiopathic scoliosis and kyphoscoliosis;    Abdominal   Peds  Hematology negative hematology ROS (+)   Anesthesia Other Findings   Reproductive/Obstetrics                             Anesthesia Physical Anesthesia Plan  ASA: III  Anesthesia Plan: Regional   Post-op Pain Management:    Induction:   Airway Management Planned:   Additional Equipment:   Intra-op Plan:   Post-operative Plan:   Informed Consent: I have reviewed the patients History and Physical, chart, labs and discussed the procedure including the risks, benefits and alternatives for the proposed anesthesia with the patient or authorized representative who has indicated his/her understanding and acceptance.     Plan Discussed with: CRNA  Anesthesia Plan Comments: (supraclav block)        Anesthesia Quick Evaluation

## 2015-02-16 NOTE — Discharge Instructions (Signed)
General Anesthesia, Care After Refer to this sheet in the next few weeks. These instructions provide you with information on caring for yourself after your procedure. Your health care provider may also give you more specific instructions. Your treatment has been planned according to current medical practices, but problems sometimes occur. Call your health care provider if you have any problems or questions after your procedure. WHAT TO EXPECT AFTER THE PROCEDURE After the procedure, it is typical to experience:  Sleepiness.  Nausea and vomiting. HOME CARE INSTRUCTIONS  For the first 24 hours after general anesthesia:  Have a responsible person with you.  Do not drive a car. If you are alone, do not take public transportation.  Do not drink alcohol.  Do not take medicine that has not been prescribed by your health care provider.  Do not sign important papers or make important decisions.  You may resume a normal diet and activities as directed by your health care provider.  Change bandages (dressings) as directed.  If you have questions or problems that seem related to general anesthesia, call the hospital and ask for the anesthetist or anesthesiologist on call. SEEK MEDICAL CARE IF:  You have nausea and vomiting that continue the day after anesthesia.  You develop a rash. SEEK IMMEDIATE MEDICAL CARE IF:   You have difficulty breathing.  You have chest pain.  You have any allergic problems. Document Released: 09/01/2000 Document Revised: 05/31/2013 Document Reviewed: 12/09/2012 [facility] Patient Information 2015 [place], [place]. This information is not intended to replace advice given to you by your health care provider. Make sure you discuss any questions you have with your health care provider.  Sling as needed Elevation LUE prn RTC in 3-4 days for removal of drain Keep dressing dry

## 2015-02-16 NOTE — Transfer of Care (Signed)
Immediate Anesthesia Transfer of Care Note  Patient: [name]  Procedure(s) Performed: Procedure(s): LEFT BURSA ELBOW EXCISION (Left)  Patient Location: PACU  Anesthesia Type: Regional  Level of Consciousness: awake, alert  and patient cooperative  Airway and Oxygen Therapy: Patient Spontanous Breathing and Patient connected to supplemental oxygen  Post-op Assessment: Post-op Vital signs reviewed, Patient's Cardiovascular Status Stable, Respiratory Function Stable, Patent Airway and No signs of Nausea or vomiting  Post-op Vital Signs: Reviewed and stable  Complications: No apparent anesthesia complications

## 2015-02-19 ENCOUNTER — Encounter: Payer: Self-pay | Admitting: Unknown Physician Specialty

## 2015-02-20 ENCOUNTER — Inpatient Hospital Stay: Payer: Medicare Other | Attending: Internal Medicine

## 2015-02-20 ENCOUNTER — Inpatient Hospital Stay: Payer: Medicare Other

## 2015-02-20 VITALS — BP 137/90 | HR 66 | Resp 20

## 2015-02-20 DIAGNOSIS — D751 Secondary polycythemia: Secondary | ICD-10-CM | POA: Diagnosis not present

## 2015-02-20 LAB — CBC WITH DIFFERENTIAL/PLATELET
Basophils Absolute: 0 10*3/uL (ref 0–0.1)
Basophils Relative: 1 %
EOS PCT: 1 %
Eosinophils Absolute: 0.1 10*3/uL (ref 0–0.7)
HCT: 49.4 % (ref 40.0–52.0)
Hemoglobin: 16.3 g/dL (ref 13.0–18.0)
LYMPHS ABS: 1.6 10*3/uL (ref 1.0–3.6)
LYMPHS PCT: 23 %
MCH: 29.5 pg (ref 26.0–34.0)
MCHC: 33 g/dL (ref 32.0–36.0)
MCV: 89.6 fL (ref 80.0–100.0)
MONO ABS: 0.7 10*3/uL (ref 0.2–1.0)
Monocytes Relative: 10 %
Neutro Abs: 4.4 10*3/uL (ref 1.4–6.5)
Neutrophils Relative %: 65 %
PLATELETS: 245 10*3/uL (ref 150–440)
RBC: 5.52 MIL/uL (ref 4.40–5.90)
RDW: 15.3 % — AB (ref 11.5–14.5)
WBC: 6.8 10*3/uL (ref 3.8–10.6)

## 2015-02-20 LAB — SURGICAL PATHOLOGY

## 2015-03-05 ENCOUNTER — Other Ambulatory Visit: Payer: Self-pay | Admitting: Pain Medicine

## 2015-03-05 ENCOUNTER — Telehealth: Payer: Self-pay | Admitting: Pain Medicine

## 2015-03-05 DIAGNOSIS — M5136 Other intervertebral disc degeneration, lumbar region: Secondary | ICD-10-CM

## 2015-03-05 DIAGNOSIS — Z9889 Other specified postprocedural states: Secondary | ICD-10-CM

## 2015-03-05 DIAGNOSIS — M5416 Radiculopathy, lumbar region: Secondary | ICD-10-CM

## 2015-03-05 DIAGNOSIS — Z981 Arthrodesis status: Secondary | ICD-10-CM

## 2015-03-05 DIAGNOSIS — M5030 Other cervical disc degeneration, unspecified cervical region: Secondary | ICD-10-CM

## 2015-03-05 DIAGNOSIS — M51369 Other intervertebral disc degeneration, lumbar region without mention of lumbar back pain or lower extremity pain: Secondary | ICD-10-CM

## 2015-03-05 DIAGNOSIS — M533 Sacrococcygeal disorders, not elsewhere classified: Secondary | ICD-10-CM

## 2015-03-05 NOTE — Telephone Encounter (Signed)
Pt would like to get a procedure ASAP possibly on 03/12/15 and do meds that day as well. Has MR sch for 03/13/15

## 2015-03-05 NOTE — Telephone Encounter (Signed)
Pt sch for SNRB and meds on 10/3 @ 7:30

## 2015-03-05 NOTE — Telephone Encounter (Signed)
[name], and [name] Please schedule lumbosacral selective nerve root block for Monday, 03/12/2015

## 2015-03-12 ENCOUNTER — Ambulatory Visit: Payer: Medicare Other | Attending: Pain Medicine | Admitting: Pain Medicine

## 2015-03-12 VITALS — BP 126/83 | HR 51 | Temp 98.4°F | Resp 18 | Ht 69.0 in | Wt 205.0 lb

## 2015-03-12 DIAGNOSIS — M545 Low back pain: Secondary | ICD-10-CM | POA: Diagnosis present

## 2015-03-12 DIAGNOSIS — M5136 Other intervertebral disc degeneration, lumbar region: Secondary | ICD-10-CM | POA: Diagnosis not present

## 2015-03-12 DIAGNOSIS — M5030 Other cervical disc degeneration, unspecified cervical region: Secondary | ICD-10-CM

## 2015-03-12 DIAGNOSIS — M79605 Pain in left leg: Secondary | ICD-10-CM | POA: Diagnosis present

## 2015-03-12 DIAGNOSIS — M5416 Radiculopathy, lumbar region: Secondary | ICD-10-CM

## 2015-03-12 DIAGNOSIS — Z981 Arthrodesis status: Secondary | ICD-10-CM

## 2015-03-12 DIAGNOSIS — Z9889 Other specified postprocedural states: Secondary | ICD-10-CM | POA: Diagnosis not present

## 2015-03-12 DIAGNOSIS — M79604 Pain in right leg: Secondary | ICD-10-CM | POA: Diagnosis present

## 2015-03-12 DIAGNOSIS — M533 Sacrococcygeal disorders, not elsewhere classified: Secondary | ICD-10-CM

## 2015-03-12 MED ORDER — FENTANYL CITRATE (PF) 100 MCG/2ML IJ SOLN
INTRAMUSCULAR | Status: AC
  Administered 2015-03-12: 100 ug via INTRAVENOUS
  Filled 2015-03-12: qty 2

## 2015-03-12 MED ORDER — MIDAZOLAM HCL 5 MG/5ML IJ SOLN
INTRAMUSCULAR | Status: AC
  Administered 2015-03-12: 3 mg via INTRAVENOUS
  Filled 2015-03-12: qty 5

## 2015-03-12 MED ORDER — OXYCODONE HCL 10 MG PO TABS: ORAL_TABLET | ORAL | Status: DC

## 2015-03-12 MED ORDER — CEFUROXIME AXETIL 250 MG PO TABS: 250.0000 mg | ORAL_TABLET | Freq: Two times a day (BID) | ORAL | Status: DC

## 2015-03-12 MED ORDER — TRIAMCINOLONE ACETONIDE 40 MG/ML IJ SUSP
INTRAMUSCULAR | Status: AC
  Administered 2015-03-12: 10 mg
  Filled 2015-03-12: qty 1

## 2015-03-12 MED ORDER — BUPIVACAINE HCL (PF) 0.25 % IJ SOLN
INTRAMUSCULAR | Status: AC
  Filled 2015-03-12: qty 30

## 2015-03-12 MED ORDER — CEFAZOLIN SODIUM 1 G IJ SOLR
INTRAMUSCULAR | Status: AC
  Administered 2015-03-12: 1 g via INTRAVENOUS
  Filled 2015-03-12: qty 10

## 2015-03-12 MED ORDER — BUPIVACAINE HCL (PF) 0.5 % IJ SOLN
INTRAMUSCULAR | Status: AC
  Administered 2015-03-12: 30 mL
  Filled 2015-03-12: qty 30

## 2015-03-12 MED ORDER — ORPHENADRINE CITRATE 30 MG/ML IJ SOLN
INTRAMUSCULAR | Status: AC
  Administered 2015-03-12: 10 mg
  Filled 2015-03-12: qty 2

## 2015-03-12 NOTE — Progress Notes (Signed)
Subjective:    Patient ID: [name], male    DOB: 10-Apr-1951, 64 y.o.   MRN: [id number]  HPI    PROCEDURE PERFORMED: Lumbosacral selective nerve root block   NOTE: The patient is a 64 y.o. male who returns to [place] for further evaluation and treatment of pain involving the lumbar and lower extremity region. Studies consisting of MRI has revealed the patient to be with evidence of degenerative disc disease lumbar spine. Postoperative changes L3-S1, laminectomy, epidural scarring present anteriorly and thickening of epidural soft tissues no evidence of arachnoiditis. Severe neuroforaminal stenosis bilaterally, L5-S1 exiting nerve root impingement, hearing degrees with foraminal stenosis at other levels, adjacent facet degenerative joint disease and hypertrophy with possible synovitis L3-4 and L4-L5. No synovial effusions or abscesses. There is concern regarding intraspinal abnormalities contributing to the patient's symptomatology. The risks, benefits, and expectations of the procedure have been explained to the patient who was understanding and in agreement with suggested treatment plan. We will proceed with interventional treatment as discussed and as explained to the patient. The patient is understanding and in agreement with suggested treatment plan.   DESCRIPTION OF PROCEDURE: Lumbosacral selective nerve root block with IV Versed, IV fentanyl conscious sedation, EKG, blood pressure, pulse, and pulse oximetry monitoring. The procedure was performed with the patient in the prone position under fluoroscopic guidance. With the patient in the prone position, Betadine prep of proposed entry site was performed. Local anesthetic skin wheal of proposed needle entry site was prepared with 1.5% plain lidocaine with AP view of the lumbosacral spine.   PROCEDURE #1: Needle placement at the left L 2 vertebral body: A 22 -gauge needle was inserted at the inferior border of the  transverse process of the vertebral body with needle placed medial to the midline of the transverse process on AP view of the lumbosacral spine.   NEEDLE PLACEMENT AT  L3, L4, and L5  VERTEBRAL BODY LEVELS  Needle  placement was accomplished at L3, L4, and L5  vertebral body levels on the left side exactly as was accomplished at the L2  vertebral body level  and utilizing the same technique and under fluoroscopic guidance.   Needle placement was then verified on lateral view at all levels with needle tip documented to be in the posterior superior quadrant of the intervertebral foramen of  L 2, L3, L4, and L5. Following negative aspiration for heme and CSF at each level, each level was injected with 3 mL of 0.5% bupivacaine with Kenalog.   Myoneural block injections of the lumbar paraspinal musculature region Following Betadine prep of proposed entry site a 22-gauge needle was inserted in the lumbar paraspinal musculature region and following negative aspiration 2 cc of 0.5% bupivacaine with Norflex was injected for myoneural block injections 2   The patient tolerated the procedure well. A total of 10 mg of Kenalog was utilized for the procedure.   PLAN:  1. Medications: Will continue presently prescribed medication oxycodone. 2. The patient is to undergo follow-up evaluation with PCP Dr. France Ravens for evaluation of blood pressure and general medical condition status post procedure performed on today's visit. 3. Surgical follow-up evaluation. Patient will undergo follow-up surgical evaluation as discussed 4. Neurological evaluation. May be considered 5. May consider radiofrequency procedures, implantation type procedures and other treatment pending response to treatment and follow-up evaluation. 6. The patient has been advise do adhere to proper body mechanics and avoid activities which may aggravate condition. 7. The patient has  been advised to call the Pain Management Center prior to  scheduled return appointment should there be significant change in the patient's condition or should the patient have other concerns regarding condition prior to scheduled return appointment.       Review of Systems     Objective:   Physical Exam        Assessment & Plan:

## 2015-03-12 NOTE — Patient Instructions (Addendum)
PLAN  Continue present medication oxycodone and begin taking antibiotic Ceftin as prescribed. Please obtain your antibiotic today and begin taking antibiotic today  F/U PCP Dr. Gayland Curry for evaliation of  BP and general medical  condition.  F/U surgical evaluation. Follow-up surgical evaluation as discussed  F/U neurological evaluation. May consider pending follow-up evaluations  May consider radiofrequency rhizolysis or intraspinal procedures pending response to present treatment and F/U evaluation.  Patient to call Pain Management Center should patient have concerns prior to scheduled return appointment.   Pain Management Discharge Instructions  General Discharge Instructions :  If you need to reach your doctor call: Monday-Friday 8:00 am - 4:00 pm at [phone] or toll free [phone].  After clinic hours [phone] to have operator reach doctor.  Bring all of your medication bottles to all your appointments in the pain clinic.  To cancel or reschedule your appointment with Pain Management please remember to call 24 hours in advance to avoid a fee.  Refer to the educational materials which you have been given on: General Risks, I had my Procedure. Discharge Instructions, Post Sedation.  Post Procedure Instructions:  The drugs you were given will stay in your system until tomorrow, so for the next 24 hours you should not drive, make any legal decisions or drink any alcoholic beverages.  You may eat anything you prefer, but it is better to start with liquids then soups and crackers, and gradually work up to solid foods.  Please notify your doctor immediately if you have any unusual bleeding, trouble breathing or pain that is not related to your normal pain.  Depending on the type of procedure that was done, some parts of your body may feel week and/or numb.  This usually clears up by tonight or the next day.  Walk with the use of an assistive device or accompanied  by an adult for the 24 hours.  You may use ice on the affected area for the first 24 hours.  Put ice in a Ziploc bag and cover with a towel and place against area 15 minutes on 15 minutes off.  You may switch to heat after 24 hours.GENERAL RISKS AND COMPLICATIONS  What are the risk, side effects and possible complications? Generally speaking, most procedures are safe.  However, with any procedure there are risks, side effects, and the possibility of complications.  The risks and complications are dependent upon the sites that are lesioned, or the type of nerve block to be performed.  The closer the procedure is to the spine, the more serious the risks are.  Great care is taken when placing the radio frequency needles, block needles or lesioning probes, but sometimes complications can occur. 1. Infection: Any time there is an injection through the skin, there is a risk of infection.  This is why sterile conditions are used for these blocks.  There are four possible types of infection. 1. Localized skin infection. 2. Central Nervous System Infection-This can be in the form of Meningitis, which can be deadly. 3. Epidural Infections-This can be in the form of an epidural abscess, which can cause pressure inside of the spine, causing compression of the spinal cord with subsequent paralysis. This would require an emergency surgery to decompress, and there are no guarantees that the patient would recover from the paralysis. 4. Discitis-This is an infection of the intervertebral discs.  It occurs in about 1% of discography procedures.  It is difficult to treat and it may lead to surgery.  2. Pain: the needles have to go through skin and soft tissues, will cause soreness.       3. Damage to internal structures:  The nerves to be lesioned may be near blood vessels or    other nerves which can be potentially damaged.       4. Bleeding: Bleeding is more common if the patient is taking blood thinners such as   aspirin, Coumadin, Ticiid, Plavix, etc., or if he/she have some genetic predisposition  such as hemophilia. Bleeding into the spinal canal can cause compression of the spinal  cord with subsequent paralysis.  This would require an emergency surgery to  decompress and there are no guarantees that the patient would recover from the  paralysis.       5. Pneumothorax:  Puncturing of a lung is a possibility, every time a needle is introduced in  the area of the chest or upper back.  Pneumothorax refers to free air around the  collapsed lung(s), inside of the thoracic cavity (chest cavity).  Another two possible  complications related to a similar event would include: Hemothorax and Chylothorax.   These are variations of the Pneumothorax, where instead of air around the collapsed  lung(s), you may have blood or chyle, respectively.       6. Spinal headaches: They may occur with any procedures in the area of the spine.       7. Persistent CSF (Cerebro-Spinal Fluid) leakage: This is a rare problem, but may occur  with prolonged intrathecal or epidural catheters either due to the formation of a fistulous  track or a dural tear.       8. Nerve damage: By working so close to the spinal cord, there is always a possibility of  nerve damage, which could be as serious as a permanent spinal cord injury with  paralysis.       9. Death:  Although rare, severe deadly allergic reactions known as "Anaphylactic  reaction" can occur to any of the medications used.      10. Worsening of the symptoms:  We can always make thing worse.  What are the chances of something like this happening? Chances of any of this occuring are extremely low.  By statistics, you have more of a chance of getting killed in a motor vehicle accident: while driving to the hospital than any of the above occurring .  Nevertheless, you should be aware that they are possibilities.  In general, it is similar to taking a shower.  Everybody knows that you can  slip, hit your head and get killed.  Does that mean that you should not shower again?  Nevertheless always keep in mind that statistics do not mean anything if you happen to be on the wrong side of them.  Even if a procedure has a 1 (one) in a 1,000,000 (million) chance of going wrong, it you happen to be that one..Also, keep in mind that by statistics, you have more of a chance of having something go wrong when taking medications.  Who should not have this procedure? If you are on a blood thinning medication (e.g. Coumadin, Plavix, see list of "Blood Thinners"), or if you have an active infection going on, you should not have the procedure.  If you are taking any blood thinners, please inform your physician.  How should I prepare for this procedure?  Do not eat or drink anything at least six hours prior to the procedure.  Bring a driver  with you .  It cannot be a taxi.  Come accompanied by an adult that can drive you back, and that is strong enough to help you if your legs get weak or numb from the local anesthetic.  Take all of your medicines the morning of the procedure with just enough water to swallow them.  If you have diabetes, make sure that you are scheduled to have your procedure done first thing in the morning, whenever possible.  If you have diabetes, take only half of your insulin dose and notify our nurse that you have done so as soon as you arrive at the clinic.  If you are diabetic, but only take blood sugar pills (oral hypoglycemic), then do not take them on the morning of your procedure.  You may take them after you have had the procedure.  Do not take aspirin or any aspirin-containing medications, at least eleven (11) days prior to the procedure.  They may prolong bleeding.  Wear loose fitting clothing that may be easy to take off and that you would not mind if it got stained with Betadine or blood.  Do not wear any jewelry or perfume  Remove any nail coloring.  It will  interfere with some of our monitoring equipment.  NOTE: Remember that this is not meant to be interpreted as a complete list of all possible complications.  Unforeseen problems may occur.  BLOOD THINNERS The following drugs contain aspirin or other products, which can cause increased bleeding during surgery and should not be taken for 2 weeks prior to and 1 week after surgery.  If you should need take something for relief of minor pain, you may take acetaminophen which is found in Tylenol,m Datril, Anacin-3 and Panadol. It is not blood thinner. The products listed below are.  Do not take any of the products listed below in addition to any listed on your instruction sheet.  A.P.C or A.P.C with Codeine Codeine Phosphate Capsules #3 Ibuprofen Ridaura  ABC compound Congesprin Imuran rimadil  Advil Cope Indocin Robaxisal  Alka-Seltzer Effervescent Pain Reliever and Antacid Coricidin or Coricidin-D  Indomethacin Rufen  Alka-Seltzer plus Cold Medicine Cosprin Ketoprofen S-A-C Tablets  Anacin Analgesic Tablets or Capsules Coumadin Korlgesic Salflex  Anacin Extra Strength Analgesic tablets or capsules CP-2 Tablets Lanoril Salicylate  Anaprox Cuprimine Capsules Levenox Salocol  Anexsia-D Dalteparin Magan Salsalate  Anodynos Darvon compound Magnesium Salicylate Sine-off  Ansaid Dasin Capsules Magsal Sodium Salicylate  Anturane Depen Capsules Marnal Soma  APF Arthritis pain formula Dewitt's Pills Measurin Stanback  Argesic Dia-Gesic Meclofenamic Sulfinpyrazone  Arthritis Bayer Timed Release Aspirin Diclofenac Meclomen Sulindac  Arthritis pain formula Anacin Dicumarol Medipren Supac  Analgesic (Safety coated) Arthralgen Diffunasal Mefanamic Suprofen  Arthritis Strength Bufferin Dihydrocodeine Mepro Compound Suprol  Arthropan liquid Dopirydamole Methcarbomol with Aspirin Synalgos  ASA tablets/Enseals Disalcid Micrainin Tagament  Ascriptin Doan's Midol Talwin  Ascriptin A/D Dolene Mobidin Tanderil   Ascriptin Extra Strength Dolobid Moblgesic Ticlid  Ascriptin with Codeine Doloprin or Doloprin with Codeine Momentum Tolectin  Asperbuf Duoprin Mono-gesic Trendar  Aspergum Duradyne Motrin or Motrin IB Triminicin  Aspirin plain, buffered or enteric coated Durasal Myochrisine Trigesic  Aspirin Suppositories Easprin Nalfon Trillsate  Aspirin with Codeine Ecotrin Regular or Extra Strength Naprosyn Uracel  Atromid-S Efficin Naproxen Ursinus  Auranofin Capsules Elmiron Neocylate Vanquish  Axotal Emagrin Norgesic Verin  Azathioprine Empirin or Empirin with Codeine Normiflo Vitamin E  Azolid Emprazil Nuprin Voltaren  Bayer Aspirin plain, buffered or children's or timed BC Tablets or powders  Encaprin Orgaran Warfarin Sodium  Buff-a-Comp Enoxaparin Orudis Zorpin  Buff-a-Comp with Codeine Equegesic Os-Cal-Gesic   Buffaprin Excedrin plain, buffered or Extra Strength Oxalid   Bufferin Arthritis Strength Feldene Oxphenbutazone   Bufferin plain or Extra Strength Feldene Capsules Oxycodone with Aspirin   Bufferin with Codeine Fenoprofen Fenoprofen Pabalate or Pabalate-SF   Buffets II Flogesic Panagesic   Buffinol plain or Extra Strength Florinal or Florinal with Codeine Panwarfarin   Buf-Tabs Flurbiprofen Penicillamine   Butalbital Compound Four-way cold tablets Penicillin   Butazolidin Fragmin Pepto-Bismol   Carbenicillin Geminisyn Percodan   Carna Arthritis Reliever Geopen Persantine   Carprofen Gold's salt Persistin   Chloramphenicol Goody's Phenylbutazone   Chloromycetin Haltrain Piroxlcam   Clmetidine heparin Plaquenil   Cllnoril Hyco-pap Ponstel   Clofibrate Hydroxy chloroquine Propoxyphen         Before stopping any of these medications, be sure to consult the physician who ordered them.  Some, such as Coumadin (Warfarin) are ordered to prevent or treat serious conditions such as "deep thrombosis", "pumonary embolisms", and other heart problems.  The amount of time that you may need off  of the medication may also vary with the medication and the reason for which you were taking it.  If you are taking any of these medications, please make sure you notify your pain physician before you undergo any procedures.   Prescriptions given.

## 2015-03-12 NOTE — Progress Notes (Signed)
   Subjective:    Patient ID: [name], male    DOB: 11/26/1950, 63 y.o.   MRN: [id number]  HPI    Review of Systems     Objective:   Physical Exam        Assessment & Plan:  Safety precautions to be maintained throughout the outpatient stay will include: orient to surroundings, keep bed in low position, maintain call bell within reach at all times, provide assistance with transfer out of bed and ambulation.

## 2015-03-13 ENCOUNTER — Telehealth: Payer: Self-pay | Admitting: *Deleted

## 2015-03-13 ENCOUNTER — Encounter: Payer: Medicare Other | Admitting: Pain Medicine

## 2015-03-13 DIAGNOSIS — M7022 Olecranon bursitis, left elbow: Secondary | ICD-10-CM | POA: Insufficient documentation

## 2015-03-13 NOTE — Telephone Encounter (Signed)
States that leg was completely numb on yesterday and continues to be somewhat numb today but is improving.  Patient instructed that if numbness did continue to get better to call our office.

## 2015-04-03 ENCOUNTER — Inpatient Hospital Stay: Payer: Medicare Other

## 2015-04-03 ENCOUNTER — Inpatient Hospital Stay: Payer: Medicare Other | Attending: Internal Medicine

## 2015-04-03 DIAGNOSIS — D751 Secondary polycythemia: Secondary | ICD-10-CM

## 2015-04-03 LAB — HEMATOCRIT: HEMATOCRIT: 50.4 % (ref 40.0–52.0)

## 2015-04-11 ENCOUNTER — Ambulatory Visit: Payer: Medicare Other | Attending: Pain Medicine | Admitting: Pain Medicine

## 2015-04-11 ENCOUNTER — Encounter: Payer: Self-pay | Admitting: Pain Medicine

## 2015-04-11 VITALS — BP 170/102 | HR 50 | Temp 97.6°F | Resp 20 | Ht 69.0 in | Wt 205.0 lb

## 2015-04-11 DIAGNOSIS — M4806 Spinal stenosis, lumbar region: Secondary | ICD-10-CM | POA: Diagnosis not present

## 2015-04-11 DIAGNOSIS — M5136 Other intervertebral disc degeneration, lumbar region: Secondary | ICD-10-CM | POA: Insufficient documentation

## 2015-04-11 DIAGNOSIS — M5416 Radiculopathy, lumbar region: Secondary | ICD-10-CM | POA: Diagnosis not present

## 2015-04-11 DIAGNOSIS — M5030 Other cervical disc degeneration, unspecified cervical region: Secondary | ICD-10-CM | POA: Diagnosis not present

## 2015-04-11 DIAGNOSIS — Z9889 Other specified postprocedural states: Secondary | ICD-10-CM | POA: Insufficient documentation

## 2015-04-11 DIAGNOSIS — M19029 Primary osteoarthritis, unspecified elbow: Secondary | ICD-10-CM | POA: Insufficient documentation

## 2015-04-11 DIAGNOSIS — M79601 Pain in right arm: Secondary | ICD-10-CM | POA: Diagnosis present

## 2015-04-11 DIAGNOSIS — M533 Sacrococcygeal disorders, not elsewhere classified: Secondary | ICD-10-CM

## 2015-04-11 DIAGNOSIS — Z981 Arthrodesis status: Secondary | ICD-10-CM

## 2015-04-11 DIAGNOSIS — M79602 Pain in left arm: Secondary | ICD-10-CM | POA: Diagnosis present

## 2015-04-11 DIAGNOSIS — M542 Cervicalgia: Secondary | ICD-10-CM | POA: Diagnosis present

## 2015-04-11 MED ORDER — OXYCODONE HCL 10 MG PO TABS: ORAL_TABLET | ORAL | Status: DC

## 2015-04-11 NOTE — Patient Instructions (Addendum)
  PLAN   Continue present medication oxycodone  F/U PCP Dr. Gayland Curry for evaliation of  BP and general medical  condition Follow-up with Dr. Astrid Divine regarding blood pressure which was elevated today   F/U surgical evaluation. Follow-up with surgeon as discussed and I will consider lumbar epidural steroid injection as well as radiofrequency procedures pending surgical disposition  F/U neurological evaluation. May consider pending follow-up evaluations  May consider radiofrequency rhizolysis or intraspinal procedures pending response to present treatment and F/U evaluation   Patient to call Pain Management Center should patient have concerns prior to scheduled return appointment.

## 2015-04-11 NOTE — Progress Notes (Signed)
Safety precautions to be maintained throughout the outpatient stay will include: orient to surroundings, keep bed in low position, maintain call bell within reach at all times, provide assistance with transfer out of bed and ambulation.  

## 2015-04-11 NOTE — Progress Notes (Signed)
   Subjective:    Patient ID: [name], male    DOB: 12/13/1950, 64 y.o.   MRN: [id number]  HPI  Patient is 64 year old gentleman who [facility] evaluation and treatment of pain involving the neck upper extremity region lower back and lower extremity region. Patient continues to have significant lower back lower extremity pain and weakness. We will advise patient to return to surgeon to discuss surgical intervention. We will proceed with interventional treatment consisting of injections of the lumbar region as well as radiofrequency procedures as discussed with patient on today's visit. We will await surgical disposition I consider patient process treatment pending plan of surgeon and patient. The patient was understanding and agreement with suggested treatment plan. The patient has significant spasms in the psoas muscle in addition to degenerative disc disease of the lumbar spine. Slump on is suggestive of radiculopathy and neurogenic claudication as well The atient will continue oxycodone as prescribed at this time     Review of Systems     Objective:   Physical Exam  There was tenderness of the splenius capitis and occipitalis musculature regions of mild to moderate degree. There was well-healed scar the cervical region without increased warmth and erythema in the region scar. Patient appeared to be with slightly decreased grip strength. There was tenderness to palpation of the medial and lateral epicondyles of the elbow. Tinel and Phalen's maneuver were without increase of pain significant degree. Patient appeared to be with unremarkable Spurling's maneuver. Palpation over the thoracic facet thoracic paraspinal musculature region was with evidence of muscle spasm. No crepitus of the thoracic region was noted. Palpation of the lumbar paraspinal musculature and lumbar facet region was a tends to palpation of moderate to moderately severe degree. There  was tends to palpation of the PSIS and PII S region of moderate degree and mild tenderness of the greater trochanteric region iliotibial band region. No definite sensory deficit of dermatomal distribution was detected. Lateral bending and rotation and extension to palpation lumbar facets reproducing severe pain. Straight leg raising tolerates approximately 20. There was negative clonus negative Homans. Abdomen soft nontender with no costovertebral angle tenderness noted.    Assessment & Plan:   Degenerative disc disease lumbar spine Postoperative changes L3-S1, laminectomy, epidural scarring present anteriorly and thickening of epidural soft tissues no evidence of arachnoiditis. Severe neuroforaminal stenosis bilaterally, L5-S1 exiting nerve root impingement, hearing degrees with foraminal stenosis at other levels, adjacent facet degenerative joint disease and hypertrophy with possible synovitis L3-4 and L4-L5. No synovial effusions or abscesses  Lumbar radiculopathy  Psoas compartment syndrome  Lumbar stenosis with neurogenic claudication  Lumbar facet syndrome with severe muscle spas  Degenerative disc disease cervical spine Status post surgery of the cervical region  DJD of elbow   PLAN    Continue present medication oxycodone  F/U PCP Dr. Gayland Curry for evaliation of  BP and general medical  condition  F/U surgical evaluation. Patient will follow-up with Dr. Arnoldo Morale as discussed. Patient also is to undergo follow-up surgical evaluation of the left elbow with plans for surgery of the left elbow  F/U neurological evaluation. May consider pending follow-up evaluations   May consider radiofrequency procedures and implantation procedures pending response to treatment and follow-up evaluation  Patient to call [facility] should there be change in condition prior to scheduled return appointment by have other concerns regarding condition prior to scheduled return  appointment

## 2015-05-10 ENCOUNTER — Ambulatory Visit: Payer: Medicare Other | Attending: Pain Medicine | Admitting: Pain Medicine

## 2015-05-10 ENCOUNTER — Encounter: Payer: Self-pay | Admitting: Pain Medicine

## 2015-05-10 VITALS — BP 161/99 | HR 68 | Temp 97.5°F | Resp 16 | Ht 69.0 in | Wt 205.0 lb

## 2015-05-10 DIAGNOSIS — T79A0XA Compartment syndrome, unspecified, initial encounter: Secondary | ICD-10-CM | POA: Diagnosis not present

## 2015-05-10 DIAGNOSIS — M5116 Intervertebral disc disorders with radiculopathy, lumbar region: Secondary | ICD-10-CM | POA: Diagnosis not present

## 2015-05-10 DIAGNOSIS — Z981 Arthrodesis status: Secondary | ICD-10-CM

## 2015-05-10 DIAGNOSIS — M5136 Other intervertebral disc degeneration, lumbar region: Secondary | ICD-10-CM

## 2015-05-10 DIAGNOSIS — M4806 Spinal stenosis, lumbar region: Secondary | ICD-10-CM | POA: Diagnosis not present

## 2015-05-10 DIAGNOSIS — M51369 Other intervertebral disc degeneration, lumbar region without mention of lumbar back pain or lower extremity pain: Secondary | ICD-10-CM

## 2015-05-10 DIAGNOSIS — M533 Sacrococcygeal disorders, not elsewhere classified: Secondary | ICD-10-CM

## 2015-05-10 DIAGNOSIS — M79602 Pain in left arm: Secondary | ICD-10-CM | POA: Diagnosis present

## 2015-05-10 DIAGNOSIS — X58XXXA Exposure to other specified factors, initial encounter: Secondary | ICD-10-CM | POA: Insufficient documentation

## 2015-05-10 DIAGNOSIS — M5030 Other cervical disc degeneration, unspecified cervical region: Secondary | ICD-10-CM | POA: Diagnosis not present

## 2015-05-10 DIAGNOSIS — M542 Cervicalgia: Secondary | ICD-10-CM | POA: Diagnosis present

## 2015-05-10 DIAGNOSIS — Z9889 Other specified postprocedural states: Secondary | ICD-10-CM

## 2015-05-10 DIAGNOSIS — M79601 Pain in right arm: Secondary | ICD-10-CM | POA: Diagnosis present

## 2015-05-10 DIAGNOSIS — M5416 Radiculopathy, lumbar region: Secondary | ICD-10-CM

## 2015-05-10 MED ORDER — OXYCODONE HCL 10 MG PO TABS: ORAL_TABLET | ORAL | Status: DC

## 2015-05-10 NOTE — Patient Instructions (Addendum)
  PLAN   Continue present medication oxycodone  F/U PCP Dr. Gayland Curry for evaliation of  BP and general medical  condition   F/U surgical evaluation. Follow-up with surgeon as discussed  F/U gastroenterology neurological evaluation for evaluation of hepatic condition as planned  F/U neurological evaluation. May consider pending follow-up evaluations  May consider radiofrequency rhizolysis or intraspinal procedures pending response to present treatment and F/U evaluation   Patient to call [facility] should patient have concerns prior to scheduled return appointment.

## 2015-05-10 NOTE — Progress Notes (Signed)
Subjective:    Patient ID: [name], male    DOB: [birth year], 64 y.o.   MRN: [id number]  HPI  The patient is a 64 year old gentleman who returns to [facility] for further evaluation and treatment of pain involving the region of the neck upper extremity regions mid lower back and lower extremity region. At the present time patient continues to undergo care for treatment of his hepatitis. The patient also will continue evaluation of his lower back lower extremity pain and will follow-up with Dr. Bland Span and Dr. Ellene Route in this regard. The patient denies any trauma change in condition to cause change in symptomatology. We will continue medications as prescribed and we'll remain available to modified treatment regimen pending further evaluation of patient and pending surgical disposition's. Patient states the lower back lower extremity pain is becoming progressively more significant and is associated with numbness of the lower extremities. We've discussed various treatments as well as patient proceed with surgical intervention. The patient will follow-up with Dr. Bland Span and Dr. Ellene Route as discussed and we will remain available to modified treatment regimen accordingly. All were understanding and agreed to suggested treatment plan.     Review of Systems     Objective:   Physical Exam  There was tenderness to palpation of paraspinal musculatures and cervical region cervical facet region a mild degree. There was mild tenderness to palpation over the cervical facet cervical paraspinal must region as well as the splenius capitis and occipitalis musculature regions. Patient appeared to be with slightly decreased grip strength. Tinel and Phalen's maneuver were without increased pain of significant degree. Patient was with unremarkable Spurling's maneuver. There was tends tenderness to palpation of the epicondyles of the elbow. There was tenderness to palpation over the thoracic facet  thoracic paraspinal musculature region with no crepitus of the thoracic region noted. Lateral bending rotation extension and palpation of the lumbar facets reproduced moderately severe discomfort. Straight leg raising was tolerates approximately 20 without an increase of pain with dorsiflexion noted. No definite sensory deficit or dermatomal speech the lower extremity is noted. EHL strength appeared to be slightly decreased. There was negative clonus negative Homans. There was mild tenderness over the PSIS and PII S region as well as the greater trochanteric region iliotibial band region. Reevaluation region of the lumbar paraspinal must reason lumbar facet region reproduces moderately severe discomfort. The abdomen was nontender with no costovertebral tenderness noted.        Assessment & Plan:     Degenerative disc disease lumbar spine Postoperative changes L3-S1, laminectomy, epidural scarring present anteriorly and thickening of epidural soft tissues no evidence of arachnoiditis. Severe neuroforaminal stenosis bilaterally, L5-S1 exiting nerve root impingement, hearing degrees with foraminal stenosis at other levels, adjacent facet degenerative joint disease and hypertrophy with possible synovitis L3-4 and L4-L5. No synovial effusions or abscesses  Lumbar radiculopathy  Psoas compartment syndrome  Lumbar stenosis with neurogenic claudication  Lumbar facet syndrome with severe muscle spas  Degenerative disc disease cervical spine Status post surgery of the cervical region    PLAN    Continue present medication oxycodone  F/U PCP Dr. Gayland Curry for evaliation of  BP and general medical  condition   F/U surgical evaluation. Follow-up with surgeon as discussed  F/U gastroenterology neurological evaluation for evaluation of hepatic condition as planned  F/U neurological evaluation. May consider pending follow-up evaluations  May consider radiofrequency rhizolysis or  intraspinal procedures pending response to present treatment and F/U evaluation  Patient to call [facility] should patient have concerns prior to scheduled return appointment.

## 2015-05-10 NOTE — Progress Notes (Signed)
Safety precautions to be maintained throughout the outpatient stay will include: orient to surroundings, keep bed in low position, maintain call bell within reach at all times, provide assistance with transfer out of bed and ambulation.   Patient here for medication refill

## 2015-05-15 ENCOUNTER — Inpatient Hospital Stay: Payer: Medicare Other

## 2015-05-15 ENCOUNTER — Inpatient Hospital Stay: Payer: Medicare Other | Attending: Internal Medicine

## 2015-05-18 ENCOUNTER — Other Ambulatory Visit: Payer: Self-pay | Admitting: Pain Medicine

## 2015-05-25 ENCOUNTER — Other Ambulatory Visit: Payer: Self-pay | Admitting: Pain Medicine

## 2015-06-05 ENCOUNTER — Ambulatory Visit: Payer: Medicare Other | Attending: Pain Medicine | Admitting: Pain Medicine

## 2015-06-05 ENCOUNTER — Encounter: Payer: Self-pay | Admitting: Pain Medicine

## 2015-06-05 VITALS — BP 142/99 | HR 59 | Temp 98.1°F | Resp 18 | Ht 69.0 in | Wt 220.0 lb

## 2015-06-05 DIAGNOSIS — M5136 Other intervertebral disc degeneration, lumbar region: Secondary | ICD-10-CM

## 2015-06-05 DIAGNOSIS — M5416 Radiculopathy, lumbar region: Secondary | ICD-10-CM

## 2015-06-05 DIAGNOSIS — M533 Sacrococcygeal disorders, not elsewhere classified: Secondary | ICD-10-CM

## 2015-06-05 DIAGNOSIS — M542 Cervicalgia: Secondary | ICD-10-CM | POA: Diagnosis present

## 2015-06-05 DIAGNOSIS — M5030 Other cervical disc degeneration, unspecified cervical region: Secondary | ICD-10-CM | POA: Diagnosis not present

## 2015-06-05 DIAGNOSIS — M4806 Spinal stenosis, lumbar region: Secondary | ICD-10-CM | POA: Insufficient documentation

## 2015-06-05 DIAGNOSIS — Z9889 Other specified postprocedural states: Secondary | ICD-10-CM | POA: Diagnosis not present

## 2015-06-05 DIAGNOSIS — Z981 Arthrodesis status: Secondary | ICD-10-CM

## 2015-06-05 DIAGNOSIS — M5116 Intervertebral disc disorders with radiculopathy, lumbar region: Secondary | ICD-10-CM | POA: Diagnosis not present

## 2015-06-05 DIAGNOSIS — M79601 Pain in right arm: Secondary | ICD-10-CM | POA: Diagnosis present

## 2015-06-05 DIAGNOSIS — M79602 Pain in left arm: Secondary | ICD-10-CM | POA: Diagnosis present

## 2015-06-05 MED ORDER — OXYCODONE HCL 10 MG PO TABS: ORAL_TABLET | ORAL | Status: DC

## 2015-06-05 NOTE — Progress Notes (Signed)
Safety precautions to be maintained throughout the outpatient stay will include: orient to surroundings, keep bed in low position, maintain call bell within reach at all times, provide assistance with transfer out of bed and ambulation.  

## 2015-06-05 NOTE — Patient Instructions (Addendum)
  PLAN   Continue present medication oxycodone  F/U PCP Dr. Gayland Curry for evaliation of  BP and general medical  condition   F/U surgical evaluation. Follow-up with surgeon as discussed  F/U gastroenterology neurological evaluation for evaluation of hepatic condition as planned  F/U neurological evaluation. Ask receptionist the date of your PNCV/EMG studies to evaluate your lumbar and lower extremity pain and paresthesias  May consider radiofrequency rhizolysis or intraspinal procedures pending response to present treatment and F/U evaluation   Patient to call [facility] should patient have concerns prior to scheduled return appointment.

## 2015-06-05 NOTE — Progress Notes (Signed)
Subjective:    Patient ID: [name], male    DOB: September 20, 1950, 63 y.o.   MRN: [id number]  HPI  The patient is a 64 year old gentleman who returns to [facility] for further evaluation and treatment of pain involving the neck and upper extremity regions as well as the lumbar and lower extremity region. The patient has had progressive increasing pain of the lower extremity regions. Patient is with prior surgical intervention of the cervical and lumbar regions. We discussed patient's condition on today's visit and we'll schedule patient for PNCV/EMG studies for further evaluation of the lower extremity pain and paresthesias. The patient will also undergo follow-up neurosurgical evaluation and we will remain available to consider patient for additional modifications of treatment pending results of studies and follow up surgical evaluations and dispositions. The patient was understanding and agreement suggested treatment plan. The patient will continue oxycodone as discussed. We will also consider patient for interventional treatment. Patient previously sustained significant relief of pain following lumbosacral selective nerve root block with psoas compartment block. At the present time we will await surgical disposition as well as results of PNCV EMG studies and consider patient for modification of treatment regimen pending follow-up evaluation as discussed Explained to patient on today's visit. That his lower back posterior knee pain with associated with severe pain and paresthesias as well as weakness.     Review of Systems     Objective:   Physical Exam  There was tends to palpation of paraspinal misreading cervical region cervical facet region palpation which reproduces pain of moderate degree. There was well-healed surgical scar of the cervical region without increased warmth and erythema in the region scar. Palpation of the splenius capitis and occipitalis muscular regions  reproduce mild to moderate discomfort. Patient appeared to be with slightly decreased grip strength and Tinel and Phalen's maneuver were without increased pain of significant degree. There was tenderness to palpation of the medial and lateral epicondyles of the elbows of the left upper extremity and right upper extremity. There was tends to palpation of the thoracic facet thoracic paraspinal musculature region with severe muscle spasms of the lower thoracic paraspinal musculature region without crepitus of the thoracic region noted. Palpation over the lumbar paraspinal must reason lumbar facet region associated with moderate to moderately severe degree of pain with lateral bending rotation extension and palpation of the lumbar facets reproducing moderate to moderately severe discomfort. Straight leg raise was limited to approximately 20 without increased pain with dorsiflexion noted. EHL strength appeared to be decreased DTRs were difficult to elicit appeared to be trace at the knees. The clonus negative Homans. Palpation over the PSIS and PII S regions reproduces moderately severe discomfort. There was mild tenderness of the greater trochanteric region and iliotibial band region.. Abdomen was nontender and no costovertebral tenderness noted.    Assessment & Plan:    Degenerative disc disease lumbar spine Postoperative changes L3-S1, laminectomy, epidural scarring present anteriorly and thickening of epidural soft tissues no evidence of arachnoiditis. Severe neuroforaminal stenosis bilaterally, L5-S1 exiting nerve root impingement, hearing degrees with foraminal stenosis at other levels, adjacent facet degenerative joint disease and hypertrophy with possible synovitis L3-4 and L4-L5. No synovial effusions or abscesses  Lumbar radiculopathy  Psoas compartment syndrome  Lumbar stenosis with neurogenic claudication  Lumbar facet syndrome with severe muscle spas  Degenerative disc disease cervical  spine Status post surgery of the cervical region     PLAN   Continue present medication oxycodone  F/U  PCP Dr. Gayland Curry for evaliation of  BP and general medical  condition   F/U surgical evaluation. Follow-up with surgeon as discussed  F/U gastroenterology neurological evaluation for evaluation of hepatic condition as planned  F/U neurological evaluation. Ask receptionist the date of your PNCV/EMG studies to evaluate your lumbar and lower extremity pain and paresthesias  May consider radiofrequency rhizolysis or intraspinal procedures pending response to present treatment and F/U evaluation   Patient to call [facility] should patient have concerns prior to scheduled return appointment.

## 2015-06-12 ENCOUNTER — Encounter: Payer: Medicare Other | Admitting: Pain Medicine

## 2015-06-26 ENCOUNTER — Telehealth: Payer: Self-pay | Admitting: *Deleted

## 2015-06-26 ENCOUNTER — Inpatient Hospital Stay: Payer: Medicare Other

## 2015-06-26 ENCOUNTER — Other Ambulatory Visit: Payer: Self-pay | Admitting: *Deleted

## 2015-06-26 ENCOUNTER — Inpatient Hospital Stay: Payer: Medicare Other | Attending: Internal Medicine

## 2015-06-26 DIAGNOSIS — D751 Secondary polycythemia: Secondary | ICD-10-CM | POA: Insufficient documentation

## 2015-06-26 LAB — HEMATOCRIT: HEMATOCRIT: 51.3 % (ref 40.0–52.0)

## 2015-06-26 NOTE — Telephone Encounter (Signed)
Lab called. Orders in under Dr. Beverly Gust name. Asking if Dr. Rogue Bussing could enter the hct order in chl. Explained that Dr. Rogue Bussing has left for the afternoon. Pt not established yet with Dr. Rogue Bussing at this time. Pt is scheduled to see "covering provider in May."  Most likely, Dr. Rogue Bussing would like to see the patient sooner to be established. Pt last seen by Dr. Ma Hillock in June 2016.  Pt has monthly lab/phlebotomy orders in place.  We can order the hct under Dr. Leighton Ruff name for today since Dr. Rudean Hitt is on call. If pt needs phlebotomy, then Dr. Rudean Hitt could sign the orders and change the ordering provider.  RN Spoke with Charlesetta Ivory,  Pt agreeable to come on another day of the week to see Dr. Rogue Bussing as MD is in [employer] on Tuesday.

## 2015-06-26 NOTE — Progress Notes (Signed)
Hct: 51.3. MD, Dr. Rudean Hitt, notified. Per MD, Dr. Rudean Hitt, order: patient does not need a phlebotomy today. Pt. is to be discharged to home at this time.

## 2015-06-26 NOTE — Telephone Encounter (Signed)
Patient has secondary erythrocytosis, rather than polycythemia vera. In this situation phlebotomies are not needed, and can be accepted only if provide significant clinical benefit to the patient. Since [name] apparently does not feel any different whether he receives or does not receive phlebotomy, then phlebotomy is not needed today.Roxana Hires, MD 06/26/2015 4:24 PM

## 2015-07-12 ENCOUNTER — Encounter: Payer: Self-pay | Admitting: Pain Medicine

## 2015-07-12 ENCOUNTER — Ambulatory Visit: Payer: Medicare Other | Attending: Pain Medicine | Admitting: Pain Medicine

## 2015-07-12 VITALS — BP 147/92 | HR 60 | Temp 98.1°F | Resp 16 | Ht 69.0 in | Wt 215.0 lb

## 2015-07-12 DIAGNOSIS — M47896 Other spondylosis, lumbar region: Secondary | ICD-10-CM | POA: Insufficient documentation

## 2015-07-12 DIAGNOSIS — M542 Cervicalgia: Secondary | ICD-10-CM | POA: Diagnosis present

## 2015-07-12 DIAGNOSIS — M5116 Intervertebral disc disorders with radiculopathy, lumbar region: Secondary | ICD-10-CM | POA: Diagnosis not present

## 2015-07-12 DIAGNOSIS — M5030 Other cervical disc degeneration, unspecified cervical region: Secondary | ICD-10-CM | POA: Diagnosis not present

## 2015-07-12 DIAGNOSIS — M4806 Spinal stenosis, lumbar region: Secondary | ICD-10-CM | POA: Diagnosis not present

## 2015-07-12 DIAGNOSIS — M5416 Radiculopathy, lumbar region: Secondary | ICD-10-CM

## 2015-07-12 DIAGNOSIS — M546 Pain in thoracic spine: Secondary | ICD-10-CM | POA: Diagnosis present

## 2015-07-12 DIAGNOSIS — M5136 Other intervertebral disc degeneration, lumbar region: Secondary | ICD-10-CM

## 2015-07-12 DIAGNOSIS — Z981 Arthrodesis status: Secondary | ICD-10-CM

## 2015-07-12 DIAGNOSIS — M62830 Muscle spasm of back: Secondary | ICD-10-CM | POA: Insufficient documentation

## 2015-07-12 DIAGNOSIS — Z9889 Other specified postprocedural states: Secondary | ICD-10-CM | POA: Diagnosis not present

## 2015-07-12 DIAGNOSIS — M533 Sacrococcygeal disorders, not elsewhere classified: Secondary | ICD-10-CM

## 2015-07-12 MED ORDER — OXYCODONE HCL 15 MG PO TABS: ORAL_TABLET | ORAL | Status: DC

## 2015-07-12 MED ORDER — OXYCODONE HCL 10 MG PO TABS: ORAL_TABLET | ORAL | Status: DC

## 2015-07-12 NOTE — Progress Notes (Signed)
Safety precautions to be maintained throughout the outpatient stay will include: orient to surroundings, keep bed in low position, maintain call bell within reach at all times, provide assistance with transfer out of bed and ambulation.  

## 2015-07-12 NOTE — Patient Instructions (Addendum)
  PLAN   Continue present medication oxycodone. Please note that oxycodone pill is now 15 mg as discussed. Oxycodone can cause respiratory depression, excessive sedation, and other side effects as discussed. Exercise extreme caution when taking the stronger oxycodone as we discussed and have an adult driver take you to the emergency room immediately if you develop any of these symptoms  F/U PCP Dr. Gayland Curry for evaliation of  BP and general medical  condition   F/U surgical evaluation. Follow-up with surgeon as discussed  F/U gastroenterology neurological evaluation for evaluation of hepatic condition as discussed  F/U neurological evaluation.   May consider radiofrequency rhizolysis or intraspinal procedures pending response to present treatment and F/U evaluation   Patient to call [facility] should patient have concerns prior to scheduled return appointment.

## 2015-07-12 NOTE — Progress Notes (Signed)
Subjective:    Patient ID: [name], male    DOB: [birth year], 65 y.o.   MRN: [id number]  HPI  The patient is a 65 year old gentleman who returns to pain management for further evaluation and treatment of pain involving the neck entire back upper and lower extremity region. This present time patient is with numbness and tingling of the lower extremities associated with significant lower back pain. The patient is status post surgical intervention of both the cervical and lumbar regions. The patient is planning to undergo additional surgery of the lumbar region. The patient will follow up with neurosurgeon as discussed. We discussed interventional treatment. Patient has had significant benefit with prior interventional treatment consisting of lumbosacral selective nerve root blocks combined psoas compartment block as well . The patient stated that the blocks afforded him with tremendous relief of pain. We discussed patient's condition and will remain available to consider patient for additional interventional treatment. The present time patient will undergo surgical reevaluation. We also discussed patient's medications and due to the patient's insurance we will have to change the 10 mg oxycodone. Please 15 mg by mouth. The patient has been informed regarding the increased strength to pill and has been cautioned regarding respiratory depression excessive sedation confusion and other side effects which can occur with the trauma medication. The patient expressed understanding and willingness to comply to avoid undesirable side effects. All were in agreement with suggested treatment plan  Review of Systems     Objective:   Physical Exam  There was tenderness of the splenius capitis and occipitalis muscles region a mild to moderate degree with well-healed surgical scar of the cervical region without increased warmth and erythema in the region of the scar. The patient was with limited range of  motion of the cervical spine. There appeared to be unremarkable Spurling's maneuver. Tinel and Phalen's maneuver were without increased pain. There was tends to palpation with increased pain with Tinel and Phalen's maneuver with out definite decreased grip strength. Palpation over the thoracic facet thoracic paraspinal must reason was attends to palpation with increased muscle spasms involving thoracic paraspinal musculature region. Palpation over the lumbar paraspinal musculature region lumbar facet region was with severely limited range of motion of the lumbar spine with extension and palpation of the lumbar facets reproducing moderately severe to severe tenderness to palpation with limited range of motion flexion extension lateral bending. There was tenderness of the PSIS and PII S region a moderate severe degree as well straight leg raising limited to 20 without a definite increased pain with dorsiflexion noted. There was question decreased sensation of the L5 dermatomal distribution. Palpation of the PSIS and PII S region reproduced severe pain. DTRs were difficult to this patient difficult relaxing. There was decreased EHL strength. There was negative clonus negative Homans. Abdomen without significant tends to palpation and no costovertebral angle tenderness noted      Assessment & Plan:     Degenerative disc disease lumbar spine Postoperative changes L3-S1, laminectomy, epidural scarring present anteriorly and thickening of epidural soft tissues no evidence of arachnoiditis. Severe neuroforaminal stenosis bilaterally, L5-S1 exiting nerve root impingement, hearing degrees with foraminal stenosis at other levels, adjacent facet degenerative joint disease and hypertrophy with possible synovitis L3-4 and L4-L5. No synovial effusions or abscesses  Lumbar radiculopathy  Psoas compartment syndrome  Lumbar stenosis with neurogenic claudication  Lumbar facet syndrome with severe muscle  spas  Degenerative disc disease cervical spine Status post surgery of the cervical region  PLAN   Continue present medication oxycodone. Please note that oxycodone pill is now 15 mg as discussed. Oxycodone can cause respiratory depression, excessive sedation, and other side effects as discussed. Exercise extreme caution when taking the stronger oxycodone as we discussed and have an adult driver take you to the emergency room immediately if you develop any of these symptoms  F/U PCP Dr. Gayland Curry for evaliation of  BP and general medical  condition   F/U surgical evaluation. Follow-up with surgeon as discussed  F/U gastroenterology neurological evaluation for evaluation of hepatic condition as discussed  F/U neurological evaluation.   May consider radiofrequency rhizolysis or intraspinal procedures pending response to present treatment and F/U evaluation   Patient to call [facility] should patient have concerns prior to scheduled return appointment.

## 2015-08-03 ENCOUNTER — Other Ambulatory Visit: Payer: Self-pay | Admitting: *Deleted

## 2015-08-03 DIAGNOSIS — D751 Secondary polycythemia: Secondary | ICD-10-CM

## 2015-08-06 ENCOUNTER — Other Ambulatory Visit: Payer: Self-pay | Admitting: *Deleted

## 2015-08-06 ENCOUNTER — Other Ambulatory Visit: Payer: Medicare Other

## 2015-08-06 ENCOUNTER — Ambulatory Visit: Payer: Medicare Other | Admitting: Internal Medicine

## 2015-08-06 DIAGNOSIS — D751 Secondary polycythemia: Secondary | ICD-10-CM

## 2015-08-07 ENCOUNTER — Inpatient Hospital Stay: Payer: Medicare Other

## 2015-08-08 ENCOUNTER — Inpatient Hospital Stay (HOSPITAL_BASED_OUTPATIENT_CLINIC_OR_DEPARTMENT_OTHER): Payer: Medicare Other | Admitting: Internal Medicine

## 2015-08-08 ENCOUNTER — Inpatient Hospital Stay: Payer: Medicare Other | Attending: Internal Medicine

## 2015-08-08 ENCOUNTER — Inpatient Hospital Stay: Payer: Medicare Other

## 2015-08-08 VITALS — BP 140/90 | HR 58 | Temp 96.8°F | Resp 20 | Ht 69.0 in | Wt 216.1 lb

## 2015-08-08 DIAGNOSIS — M1990 Unspecified osteoarthritis, unspecified site: Secondary | ICD-10-CM | POA: Insufficient documentation

## 2015-08-08 DIAGNOSIS — K210 Gastro-esophageal reflux disease with esophagitis: Secondary | ICD-10-CM | POA: Diagnosis not present

## 2015-08-08 DIAGNOSIS — N400 Enlarged prostate without lower urinary tract symptoms: Secondary | ICD-10-CM | POA: Insufficient documentation

## 2015-08-08 DIAGNOSIS — B1920 Unspecified viral hepatitis C without hepatic coma: Secondary | ICD-10-CM | POA: Insufficient documentation

## 2015-08-08 DIAGNOSIS — D751 Secondary polycythemia: Secondary | ICD-10-CM | POA: Insufficient documentation

## 2015-08-08 DIAGNOSIS — K7460 Unspecified cirrhosis of liver: Secondary | ICD-10-CM | POA: Diagnosis not present

## 2015-08-08 DIAGNOSIS — I10 Essential (primary) hypertension: Secondary | ICD-10-CM | POA: Diagnosis not present

## 2015-08-08 DIAGNOSIS — Z79899 Other long term (current) drug therapy: Secondary | ICD-10-CM | POA: Insufficient documentation

## 2015-08-08 LAB — CBC WITH DIFFERENTIAL/PLATELET
BASOS ABS: 0.3 10*3/uL — AB (ref 0–0.1)
Basophils Relative: 6 %
Eosinophils Absolute: 0.2 10*3/uL (ref 0–0.7)
Eosinophils Relative: 5 %
HEMATOCRIT: 50.1 % (ref 40.0–52.0)
HEMOGLOBIN: 16.9 g/dL (ref 13.0–18.0)
LYMPHS ABS: 0.9 10*3/uL — AB (ref 1.0–3.6)
LYMPHS PCT: 21 %
MCH: 31 pg (ref 26.0–34.0)
MCHC: 33.6 g/dL (ref 32.0–36.0)
MCV: 92.2 fL (ref 80.0–100.0)
Monocytes Absolute: 0.7 10*3/uL (ref 0.2–1.0)
Monocytes Relative: 17 %
NEUTROS ABS: 2.1 10*3/uL (ref 1.4–6.5)
Neutrophils Relative %: 51 %
PLATELETS: 181 10*3/uL (ref 150–440)
RBC: 5.44 MIL/uL (ref 4.40–5.90)
RDW: 14.4 % (ref 11.5–14.5)
WBC: 4.2 10*3/uL (ref 3.8–10.6)

## 2015-08-08 LAB — COMPREHENSIVE METABOLIC PANEL
ALK PHOS: 53 U/L (ref 38–126)
ALT: 32 U/L (ref 17–63)
AST: 44 U/L — AB (ref 15–41)
Albumin: 4 g/dL (ref 3.5–5.0)
Anion gap: 6 (ref 5–15)
BILIRUBIN TOTAL: 0.7 mg/dL (ref 0.3–1.2)
BUN: 14 mg/dL (ref 6–20)
CALCIUM: 8.6 mg/dL — AB (ref 8.9–10.3)
CHLORIDE: 103 mmol/L (ref 101–111)
CO2: 27 mmol/L (ref 22–32)
CREATININE: 1.05 mg/dL (ref 0.61–1.24)
GFR calc Af Amer: >60 mL/min (ref >60)
Glucose, Bld: 113 mg/dL — ABNORMAL HIGH (ref 65–99)
Potassium: 4 mmol/L (ref 3.5–5.1)
Sodium: 136 mmol/L (ref 135–145)
Total Protein: 7.7 g/dL (ref 6.5–8.1)

## 2015-08-08 NOTE — Progress Notes (Signed)
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 I reviewed the patient's prior charts/pertinent labs  in detail; findings are summarized above.    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  Patient has been having phlebotomies every 6 weeks or so for hematocrit greater than 48 . Patient does not feel any different after the phlebotomies. No  Clinical difference.   patient denies any headaches. Denies any Chronic shortness of breath or chest pain. He does have  Chronic back pain for which he is needing to have surgery.  He denies any burning pain in his hand and feet. No history of blood clots and strokes.  REVIEW OF SYSTEMS:  A complete 10 point review of system is done which is negative except mentioned above/history of present illness.   PAST MEDICAL HISTORY :  Past Medical History  Diagnosis Date  . Arthritis   . Chronic back pain   . Prostatitis   . Erythrocytosis   . Cirrhosis ([place])   . GERD (gastroesophageal reflux disease)   . Esophagitis, reflux 12/07/2014  . Testicular dysfunction 11/04/2011    Overview:  Dr. Jacqlyn Larsen is his urologist.   . DDD (degenerative disc disease), lumbar 11/06/2014  . DDD (degenerative disc disease), cervical 11/06/2014  . Sacroiliac joint dysfunction 11/06/2014  . 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 Idiopathic scoliosis and kyphoscoliosis 03/01/2013  . 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/31/2013  . Spinal stenosis 12/07/2014  . 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 Benign essential HTN 12/07/2014     Last Assessment & Plan:  Relevant Hx: Course: Daily Update: Today's Plan: CONTROLLED ON MEDS  . Back pain     MULTIPLE BACK SURGERIES  . 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 Neuropathy of both feet ([place])   . Painful legs and moving toes     DUE TO BACK PROBLEMS  . Hepatitis C     DID HARVONI TREATMENT/ NO LONGER HEP C POSITIVE  . HCV (hepatitis C virus) 12/07/2014    PAST SURGICAL HISTORY :   Past Surgical History  Procedure Laterality Date  . Cholecystectomy  07/2013  . Finger surgery Left 2015    THUMB  . Back surgery  04/14/13    L2-S1  . Elbow arthroscopy Right 02/2012  . Cervical spine surgery       X2 FUSION C4-7  . Skin surgery  2004    SKIN CANCER NOSE  . Nose surgery  12/01/02    SEPTUM RECONSTRUCTION  . Fundiplication  99991111  . Nissen fundoplication  99991111  . Septoplasty  1999  . Cataract extraction Left 1989  . Foot surgery Right 1984  . Shoulder surgery Right 1983  . Shoulder surgery Left 1979    WITH ELBOW (TENDON RELEASE)  . Facial reconstruction surgery  1970    BOTTLE CUT NOSE AND EYE, MULTIPLE SURGERIES  . Finger surgery Right 1966    MIDDLE  . Hernia repair Left 1981/1957    RIGHT SIDE IN [phone]  . Colonoscopy with propofol N/A  12/18/2014    Procedure: COLONOSCOPY WITH PROPOFOL;  Surgeon: Lucilla Lame, MD;  Location: [facility];  Service: Endoscopy;  Laterality: N/A;  . Esophagogastroduodenoscopy (egd) with propofol N/A 12/18/2014    Procedure: ESOPHAGOGASTRODUODENOSCOPY (EGD) WITH PROPOFOL with dialtion;  Surgeon: Lucilla Lame, MD;  Location: [facility];  Service: Endoscopy;  Laterality: N/A;  . Polypectomy  12/18/2014    Procedure: POLYPECTOMY;  Surgeon: Lucilla Lame, MD;  Location: [facility];  Service: Endoscopy;;  . Mass excision Left 02/16/2015    Procedure: LEFT BURSA ELBOW EXCISION;  Surgeon: Leanor Kail, MD;  Location: [facility];  Service: Orthopedics;   Laterality: Left;    FAMILY HISTORY :   Family History  Problem Relation Age of Onset  . Arthritis Mother   . Asthma Mother   . Cancer Mother   . Heart disease Father     SOCIAL HISTORY:   Social History  Substance Use Topics  . Smoking status: Never Smoker   . Smokeless tobacco: Not on file  . Alcohol Use: 0.6 oz/week    0 Standard drinks or equivalent, 1 Glasses of wine per week    ALLERGIES:  is allergic to no known allergies.  MEDICATIONS:  Current Outpatient Prescriptions  Medication Sig Dispense Refill  . amLODipine (NORVASC) 5 MG tablet Take 1 tablet by mouth daily.    [name] Kitchen aspirin 81 MG tablet Take 81 mg by mouth daily.    . cefUROXime (CEFTIN) 250 MG tablet Take 1 tablet (250 mg total) by mouth 2 (two) times daily with a meal. 14 tablet 0  . citalopram (CELEXA) 20 MG tablet Take 20 mg by mouth daily. AM    . furosemide (LASIX) 40 MG tablet Take 40 mg by mouth as needed. prn    . loratadine (CLARITIN) 10 MG tablet Take 10 mg by mouth daily. AM    . magnesium citrate SOLN Take 15 mLs by mouth daily. Reported on 06/05/2015    . NIACIN PO Supplemental powder-reconstituted    . omeprazole (PRILOSEC) 20 MG capsule Take 1 capsule by mouth daily.    [name] Kitchen oxyCODONE (ROXICODONE) 15 MG immediate release tablet Limit 1 tab by mouth 3-6 times per day if tolerated. NOTE.PILL IS NOW 15MG   AND MUCH STRONGER 180 tablet 0  . spironolactone (ALDACTONE) 25 MG tablet Take 25 mg by mouth daily. AM    . tamsulosin (FLOMAX) 0.4 MG CAPS capsule Take 0.4 mg by mouth daily. AM    . testosterone cypionate (DEPOTESTOTERONE CYPIONATE) 200 MG/ML injection Inject 100 mg into the muscle every 7 (seven) days.    [name] Kitchen amLODipine (NORVASC) 2.5 MG tablet Take 2.5 mg by mouth daily. Reported on 08/08/2015    . naloxone (NARCAN) 0.4 MG/ML injection Inject 0.4 mg into the muscle as needed. Reported on 08/08/2015     No current facility-administered medications for this visit.    PHYSICAL EXAMINATION:   BP 140/90  mmHg  Pulse 58  Temp(Src) 96.8 F (36 C) (Tympanic)  Resp 20  Ht 5\' 9"  (1.753 m)  Wt 216 lb 0.8 oz (98 kg)  BMI 31.89 kg/m2  Filed Weights   08/08/15 1300  Weight: 216 lb 0.8 oz (98 kg)    GENERAL: Well-nourished well-developed; Alert, no distress and comfortable.   Alone.  EYES: no pallor or icterus OROPHARYNX: no thrush or ulceration; good dentition  NECK: supple, no masses felt LYMPH:  no palpable lymphadenopathy in the cervical, axillary or inguinal regions LUNGS: clear to auscultation and  No wheeze or crackles HEART/CVS: regular rate & rhythm and no murmurs; No lower extremity edema ABDOMEN:abdomen soft, non-tender and normal bowel sounds Musculoskeletal:no cyanosis of digits and no clubbing  PSYCH: alert & oriented x 3 with fluent speech NEURO: no focal motor/sensory deficits SKIN:  no rashes or significant lesions  LABORATORY DATA:  I have reviewed the data as listed    Component Value Date/Time   NA 136 08/08/2015 1311   NA 137 02/22/2012 0645   K 4.0 08/08/2015 1311   K 4.6 02/22/2012 0645   CL 103 08/08/2015 1311   CL 103 02/22/2012 0645   CO2 27 08/08/2015 1311   CO2 30 02/22/2012 0645   GLUCOSE 113* 08/08/2015 1311   GLUCOSE 90 02/22/2012 0645   BUN 14 08/08/2015 1311   BUN 9 02/22/2012 0645   CREATININE 1.05 08/08/2015 1311   CREATININE 0.97 02/22/2012 0645   CALCIUM 8.6* 08/08/2015 1311   CALCIUM 8.5 02/22/2012 0645   PROT 7.7 08/08/2015 1311   PROT 6.9 02/22/2012 0645   ALBUMIN 4.0 08/08/2015 1311   ALBUMIN 3.1* 02/22/2012 0645   AST 44* 08/08/2015 1311   AST 80* 02/22/2012 0645   ALT 32 08/08/2015 1311   ALT 96* 02/22/2012 0645   ALKPHOS 53 08/08/2015 1311   ALKPHOS 47* 02/22/2012 0645   BILITOT 0.7 08/08/2015 1311   BILITOT 0.3 02/22/2012 0645   GFRNONAA >60 08/08/2015 1311   GFRNONAA >60 02/22/2012 0645   GFRAA >60 08/08/2015 1311   GFRAA >60 02/22/2012 0645    No results found for: SPEP, UPEP  Lab Results  Component Value Date    WBC 4.2 08/08/2015   NEUTROABS 2.1 08/08/2015   HGB 16.9 08/08/2015   HCT 50.1 08/08/2015   MCV 92.2 08/08/2015   PLT 181 08/08/2015      Chemistry      Component Value Date/Time   NA 136 08/08/2015 1311   NA 137 02/22/2012 0645   K 4.0 08/08/2015 1311   K 4.6 02/22/2012 0645   CL 103 08/08/2015 1311   CL 103 02/22/2012 0645   CO2 27 08/08/2015 1311   CO2 30 02/22/2012 0645   BUN 14 08/08/2015 1311   BUN 9 02/22/2012 0645   CREATININE 1.05 08/08/2015 1311   CREATININE 0.97 02/22/2012 0645      Component Value Date/Time   CALCIUM 8.6* 08/08/2015 1311   CALCIUM 8.5 02/22/2012 0645   ALKPHOS 53 08/08/2015 1311   ALKPHOS 47* 02/22/2012 0645   AST 44* 08/08/2015 1311   AST 80* 02/22/2012 0645   ALT 32 08/08/2015 1311   ALT 96* 02/22/2012 0645   BILITOT 0.7 08/08/2015 1311   BILITOT 0.3 02/22/2012 0645        ASSESSMENT & PLAN:   # 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  Patient has been getting phlebotomies 300  ML if hematocrit every 6 weeks/ if hematocrit greater than 48.  Otherwise patient is asymptomatic. Recommend take aspirin.  Recommend  Checking  Hematocrit and hemoglobin every 2 months; if possible phlebotomy  If hematocrit greater than 52.  Patient agrees with the plan.  The eventual plan is to space out the phlebotomies and the lab checks.  #  Chronic back pain needing surgery.   #  Follow-up with me in 6 months; labs.  # 15 minutes face-to-face with the patient discussing the above plan of care; more than 50% of time spent on natural history; counseling and coordination.  Cammie Sickle, MD 08/08/2015 1:56 PM

## 2015-08-09 ENCOUNTER — Encounter: Payer: Self-pay | Admitting: Pain Medicine

## 2015-08-09 ENCOUNTER — Ambulatory Visit: Payer: Medicare Other | Attending: Pain Medicine | Admitting: Pain Medicine

## 2015-08-09 VITALS — BP 151/93 | HR 72 | Temp 97.8°F | Resp 16 | Ht 69.0 in | Wt 210.0 lb

## 2015-08-09 DIAGNOSIS — M5116 Intervertebral disc disorders with radiculopathy, lumbar region: Secondary | ICD-10-CM | POA: Diagnosis not present

## 2015-08-09 DIAGNOSIS — M4806 Spinal stenosis, lumbar region: Secondary | ICD-10-CM | POA: Insufficient documentation

## 2015-08-09 DIAGNOSIS — M5030 Other cervical disc degeneration, unspecified cervical region: Secondary | ICD-10-CM | POA: Insufficient documentation

## 2015-08-09 DIAGNOSIS — M5136 Other intervertebral disc degeneration, lumbar region: Secondary | ICD-10-CM

## 2015-08-09 DIAGNOSIS — M5416 Radiculopathy, lumbar region: Secondary | ICD-10-CM

## 2015-08-09 DIAGNOSIS — M533 Sacrococcygeal disorders, not elsewhere classified: Secondary | ICD-10-CM

## 2015-08-09 DIAGNOSIS — M545 Low back pain: Secondary | ICD-10-CM | POA: Diagnosis present

## 2015-08-09 DIAGNOSIS — Z9889 Other specified postprocedural states: Secondary | ICD-10-CM | POA: Insufficient documentation

## 2015-08-09 DIAGNOSIS — Z981 Arthrodesis status: Secondary | ICD-10-CM

## 2015-08-09 DIAGNOSIS — M79605 Pain in left leg: Secondary | ICD-10-CM | POA: Diagnosis present

## 2015-08-09 DIAGNOSIS — M79604 Pain in right leg: Secondary | ICD-10-CM | POA: Diagnosis present

## 2015-08-09 MED ORDER — OXYCODONE HCL 15 MG PO TABS
ORAL_TABLET | ORAL | Status: DC
Start: 2015-08-09 — End: 2015-09-11

## 2015-08-09 NOTE — Patient Instructions (Signed)
  PLAN   Continue present medication oxycodone. Please note that oxycodone pill is now 15 mg as discussed.  F/U PCP Dr. Gayland Curry for evaliation of  BP and general medical  condition   F/U surgical evaluation. Follow-up with Dr. Ellene Route as planned as discussed  F/U gastroenterology neurological evaluation for evaluation of hepatic condition as discussed  F/U neurological evaluation.. May consider [facility] EMG studies and other studies   May consider radiofrequency rhizolysis or intraspinal procedures pending response to present treatment and F/U evaluation . We will avoid such procedures as these procedures at this time  Patient to call Pain Management Center should patient have concerns prior to scheduled return appointment.

## 2015-08-09 NOTE — Progress Notes (Signed)
Subjective:    Patient ID: [name], male    DOB: 08/27/1950, 65 y.o.   MRN: [id number]  HPI  The patient is a 65 year old gentleman who returns to pain management for further evaluation and treatment of pain involving the lower back and lower extremity regions. The patient is with prior surgical intervention of the cervical region as well as surgery of the lumbar region. At the present time patient continues to complain of numbness and weakness of the lower extremities. The patient will undergo further evaluation with Dr. Ellene Route as planned. We discussed patient's condition and patient is being considered for additional surgical intervention of the lumbar region. We remain available to consider patient for treatment including interventional treatment pending disposition of Dr. Ellene Route and pending further surgical evaluation. The patient was in agreement with suggested treatment plan. We will continue present medications including oxycodone and will consider patient for modifications of treatment pending follow-up evaluation. All agreed to suggested treatment plan. The patient stated that the pain was becoming extremely severe and that the was in need of something to be done. Some due to the excruciating pain of the lumbar and lower extremity region.     Review of Systems     Objective:   Physical Exam   There was tenderness of the splenius capitis and occipitalis musculature region a mild degree there was well-healed surgical scar of the cervical region without increased warmth and erythema in the region of the scar the patient appeared to be with bilaterally equal grip strength and Tinel and Phalen's maneuver were without increased pain of significant degree. Palpation over the thoracic region was with tenderness to palpation with moderate muscle spasms involving the lower thoracic paraspinal musculature region. The patient appeared to be with unremarkable Spurling's maneuver. There was  tenderness of the acromioclavicular and glenohumeral joint region of mild to moderate degree. Palpation over the lumbar paraspinal musculatures and lumbar facet region reproduced severe discomfort with lateral bending rotation extension and palpation of the lumbar facets reproducing severely disabling pain lumbar and lower extremity region. The patient was with severely limited range of motion of the lumbar spine with severe muscle spasms involving the lumbar paraspinal musculature region. Straight leg raising was limited to approximately 20 and there was questionably decreased sensation of the L4 and L5 dermatomal regions EHL strength was questionably decreased. DTRs were difficult to elicit. The patient had difficulty relaxing. There was negative clonus negative Homans. Abdomen was nontender with no cause tenderness noted     Assessment & Plan:    Degenerative disc disease lumbar spine Postoperative changes L3-S1, laminectomy, epidural scarring present anteriorly and thickening of epidural soft tissues no evidence of arachnoiditis. Severe neuroforaminal stenosis bilaterally, L5-S1 exiting nerve root impingement, hearing degrees with foraminal stenosis at other levels, adjacent facet degenerative joint disease and hypertrophy with possible synovitis L3-4 and L4-L5. 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 Please note that oxycodone pill is now 15 mg as discussed.  F/U PCP Dr. Gayland Curry for evaliation of  BP and general medical  condition   F/U surgical evaluation. Follow-up with Dr. Ellene Route as planned as discussed  F/U gastroenterology neurological evaluation for evaluation of hepatic condition as  discussed  F/U neurological evaluation.. May consider  [facility] EMG studies and other studies   May consider radiofrequency rhizolysis or intraspinal procedures pending response to present treatment and F/U evaluation . We will avoid such procedures as these procedures at this time  Patient to call Pain Management Center should patient have concerns prior to scheduled return appointment.

## 2015-08-09 NOTE — Progress Notes (Signed)
Safety precautions to be maintained throughout the outpatient stay will include: orient to surroundings, keep bed in low position, maintain call bell within reach at all times, provide assistance with transfer out of bed and ambulation.  

## 2015-08-31 ENCOUNTER — Other Ambulatory Visit: Payer: Self-pay | Admitting: Pain Medicine

## 2015-09-11 ENCOUNTER — Ambulatory Visit: Payer: Medicare Other | Attending: Pain Medicine | Admitting: Pain Medicine

## 2015-09-11 ENCOUNTER — Encounter: Payer: Self-pay | Admitting: Pain Medicine

## 2015-09-11 VITALS — BP 144/92 | HR 64 | Temp 98.2°F | Resp 16 | Ht 69.0 in | Wt 215.0 lb

## 2015-09-11 DIAGNOSIS — M5116 Intervertebral disc disorders with radiculopathy, lumbar region: Secondary | ICD-10-CM | POA: Diagnosis not present

## 2015-09-11 DIAGNOSIS — X58XXXA Exposure to other specified factors, initial encounter: Secondary | ICD-10-CM | POA: Insufficient documentation

## 2015-09-11 DIAGNOSIS — Z981 Arthrodesis status: Secondary | ICD-10-CM

## 2015-09-11 DIAGNOSIS — M5030 Other cervical disc degeneration, unspecified cervical region: Secondary | ICD-10-CM | POA: Insufficient documentation

## 2015-09-11 DIAGNOSIS — B1920 Unspecified viral hepatitis C without hepatic coma: Secondary | ICD-10-CM | POA: Diagnosis not present

## 2015-09-11 DIAGNOSIS — M5416 Radiculopathy, lumbar region: Secondary | ICD-10-CM

## 2015-09-11 DIAGNOSIS — M79606 Pain in leg, unspecified: Secondary | ICD-10-CM | POA: Diagnosis present

## 2015-09-11 DIAGNOSIS — T79A0XA Compartment syndrome, unspecified, initial encounter: Secondary | ICD-10-CM | POA: Diagnosis not present

## 2015-09-11 DIAGNOSIS — M4806 Spinal stenosis, lumbar region: Secondary | ICD-10-CM | POA: Diagnosis not present

## 2015-09-11 DIAGNOSIS — M533 Sacrococcygeal disorders, not elsewhere classified: Secondary | ICD-10-CM

## 2015-09-11 DIAGNOSIS — M546 Pain in thoracic spine: Secondary | ICD-10-CM | POA: Diagnosis present

## 2015-09-11 DIAGNOSIS — Z9889 Other specified postprocedural states: Secondary | ICD-10-CM | POA: Insufficient documentation

## 2015-09-11 DIAGNOSIS — M5136 Other intervertebral disc degeneration, lumbar region: Secondary | ICD-10-CM

## 2015-09-11 DIAGNOSIS — M542 Cervicalgia: Secondary | ICD-10-CM | POA: Diagnosis present

## 2015-09-11 MED ORDER — OXYCODONE HCL 15 MG PO TABS: ORAL_TABLET | ORAL | Status: DC

## 2015-09-11 NOTE — Progress Notes (Signed)
Subjective:    Patient ID: [name], male    DOB: Jan 04, 1951, 65 y.o.   MRN: [id number]  HPI  The patient is a 65 year old gentleman who returns to pain management for further evaluation and treatment of pain involving the neck entire back upper and lower extremity region. The patient is presently undergoing evaluation with Dr. Leandro Reasoner with consideration being given to surgical intervention of the lumbar region began. The patient is with prior surgical intervention of the cervical region and lumbar regions. The patient states that he has progressive numbness of the lower extremities and has undergone PN[facility] EMG studies to evaluate etiology of the numbness of the lower extremity is. The patient will follow-up with Dr. Leandro Reasoner to discuss results of images of the spine lumbosacral spine as well as results with PNCV/EMG studies. We will remain available to consider patient for modification of treatment regimen pending follow-up evaluation. The patient is with history of hepatitis C and is undergone evaluation for treatment of his hepatitis and there is concern regarding side effects of the hepatitis treatment which may be contributing to some of patient's symptoms. We will await further assessment of patient's condition and will consider modification of treatment regimen as discussed. All agreed to suggested treatment plan.      Review of Systems     Objective:   Physical Exam   There was tenderness over the splenius capitis and occipitalis musculature region a moderate degree with well-healed surgical scar of the cervical region without increased warmth erythema in the region of the scar. The patient appeared to be with slightly decreased grip strength. There was tends to palpation of the medial and lateral epicondyles of the elbow. The patient also was with open lesion of the right radicular area. 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 The patient was with grip strength which was slightly decreased. There was tenderness of the acromioclavicular and glenohumeral joint regions with what appeared to be unremarkable Spurling's maneuver. The patient was with no significant increase of pain with Tinel and Phalen's maneuver area palpation over the thoracic facet thoracic paraspinal must reason was attends to palpation of moderate muscle spasm with palpation over the lumbar paraspinal must reason lumbar facet region reproducing moderate to moderately severe discomfort with lateral bending rotation extension and palpation of the lumbar facets reproducing moderately severe discomfort. Straight leg raising was tolerates approximately 20 with decreased EHL strength was questionably decreased sensation of the lower extremities. There was negative clonus negative Homans. Palpation over the PSIS and PII S region was with moderate tends to palpation with mild tenderness of the greater trochanteric region iliotibial band region. DTRs were difficult to elicit patient had difficulty relaxing. Abdomen nontender with no costovertebral tenderness noted      Assessment & Plan:     Degenerative disc disease lumbar spine Postoperative changes L3-S1, laminectomy, epidural scarring present anteriorly and thickening of epidural soft tissues no evidence of arachnoiditis. Severe neuroforaminal stenosis bilaterally, L5-S1 exiting nerve root impingement, hearing degrees with foraminal stenosis at other levels, adjacent facet degenerative joint disease and hypertrophy with possible synovitis L3-4 and L4-L5. 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 Please note that oxycodone pill is now 15 mg as discussed and as previously discussed  F/U PCP Dr. Gayland Curry for evaliation of  BP and general medical  condition   F/U surgical evaluation. Follow-up with Dr. Ellene Route as planned and as discussed to consider additional surgery of the lumbar region  F/U Dr.  Sarajane Jews in [facility] status post surgery of right ear  F/U gastroenterology neurological evaluation for evaluation of hepatic condition as discussed  F/U neurological evaluation.. Patient will follow-up with Dr. Ellene Route to discuss results of PNCV/EMG studies  May consider radiofrequency rhizolysis or intraspinal procedures pending response to present treatment and F/U evaluation . We will avoid such procedures as these procedures at this time  Patient to call Pain Management Center should patient have concerns prior to scheduled return appointment.

## 2015-09-11 NOTE — Progress Notes (Signed)
Safety precautions to be maintained throughout the outpatient stay will include: orient to surroundings, keep bed in low position, maintain call bell within reach at all times, provide assistance with transfer out of bed and ambulation.  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

## 2015-09-11 NOTE — Patient Instructions (Addendum)
  PLAN   Continue present medication oxycodone. Please note that oxycodone pill is now 15 mg as discussed and as previously discussed  F/U PCP Dr. Gayland Curry for evaliation of  BP and general medical  condition   F/U surgical evaluation. Follow-up with Dr. Ellene Route as planned and as discussed to consider additional surgery  F/U Dr.  Sarajane Jews in [facility] status post surgery of right ear  F/U gastroenterology neurological evaluation for evaluation of hepatic condition as discussed  F/U neurological evaluation.. May consider PN[facility] EMG studies and other studies   May consider radiofrequency rhizolysis or intraspinal procedures pending response to present treatment and F/U evaluation . We will avoid such procedures as these procedures at this time  Patient to call Pain Management Center should patient have concerns prior to scheduled return appointment. Pain Management Discharge Instructions  General Discharge Instructions :  If you need to reach your doctor call: Monday-Friday 8:00 am - 4:00 pm at [phone] or toll free [phone].  After clinic hours [phone] to have operator reach doctor.  Bring all of your medication bottles to all your appointments in the pain clinic.  To cancel or reschedule your appointment with Pain Management please remember to call 24 hours in advance to avoid a fee.  Refer to the educational materials which you have been given on: General Risks, I had my Procedure. Discharge Instructions, Post Sedation.  Post Procedure Instructions:  The drugs you were given will stay in your system until tomorrow, so for the next 24 hours you should not drive, make any legal decisions or drink any alcoholic beverages.  You may eat anything you prefer, but it is better to start with liquids then soups and crackers, and gradually work up to solid foods.  Please notify your doctor immediately if you have any unusual bleeding, trouble breathing or pain that is  not related to your normal pain.  Depending on the type of procedure that was done, some parts of your body may feel week and/or numb.  This usually clears up by tonight or the next day.  Walk with the use of an assistive device or accompanied by an adult for the 24 hours.  You may use ice on the affected area for the first 24 hours.  Put ice in a Ziploc bag and cover with a towel and place against area 15 minutes on 15 minutes off.  You may switch to heat after 24 hours.

## 2015-09-18 ENCOUNTER — Inpatient Hospital Stay: Payer: Medicare Other

## 2015-10-10 ENCOUNTER — Other Ambulatory Visit: Payer: Self-pay | Admitting: Internal Medicine

## 2015-10-10 ENCOUNTER — Inpatient Hospital Stay: Payer: Medicare Other

## 2015-10-10 ENCOUNTER — Inpatient Hospital Stay: Payer: Medicare Other | Attending: Internal Medicine

## 2015-10-10 DIAGNOSIS — Z79899 Other long term (current) drug therapy: Secondary | ICD-10-CM | POA: Insufficient documentation

## 2015-10-10 DIAGNOSIS — D751 Secondary polycythemia: Secondary | ICD-10-CM | POA: Diagnosis not present

## 2015-10-10 LAB — HEMATOCRIT: HEMATOCRIT: 50.5 % (ref 40.0–52.0)

## 2015-10-10 LAB — HEMOGLOBIN: HEMOGLOBIN: 17.4 g/dL (ref 13.0–18.0)

## 2015-10-10 NOTE — Progress Notes (Signed)
Hct 50.2 today.  No phlebotomy per Dr. Burlene Arnt.

## 2015-10-11 ENCOUNTER — Ambulatory Visit: Payer: Medicare Other | Attending: Pain Medicine | Admitting: Pain Medicine

## 2015-10-11 ENCOUNTER — Encounter: Payer: Self-pay | Admitting: Pain Medicine

## 2015-10-11 VITALS — BP 140/96 | HR 55 | Temp 98.0°F | Resp 16 | Ht 69.0 in | Wt 215.0 lb

## 2015-10-11 DIAGNOSIS — T79A0XA Compartment syndrome, unspecified, initial encounter: Secondary | ICD-10-CM | POA: Diagnosis not present

## 2015-10-11 DIAGNOSIS — M546 Pain in thoracic spine: Secondary | ICD-10-CM | POA: Diagnosis present

## 2015-10-11 DIAGNOSIS — M5116 Intervertebral disc disorders with radiculopathy, lumbar region: Secondary | ICD-10-CM | POA: Insufficient documentation

## 2015-10-11 DIAGNOSIS — Z9889 Other specified postprocedural states: Secondary | ICD-10-CM | POA: Diagnosis not present

## 2015-10-11 DIAGNOSIS — X58XXXA Exposure to other specified factors, initial encounter: Secondary | ICD-10-CM | POA: Insufficient documentation

## 2015-10-11 DIAGNOSIS — M5030 Other cervical disc degeneration, unspecified cervical region: Secondary | ICD-10-CM

## 2015-10-11 DIAGNOSIS — M4806 Spinal stenosis, lumbar region: Secondary | ICD-10-CM | POA: Diagnosis not present

## 2015-10-11 DIAGNOSIS — M542 Cervicalgia: Secondary | ICD-10-CM | POA: Diagnosis present

## 2015-10-11 DIAGNOSIS — M5416 Radiculopathy, lumbar region: Secondary | ICD-10-CM

## 2015-10-11 DIAGNOSIS — Z981 Arthrodesis status: Secondary | ICD-10-CM

## 2015-10-11 DIAGNOSIS — M533 Sacrococcygeal disorders, not elsewhere classified: Secondary | ICD-10-CM

## 2015-10-11 DIAGNOSIS — M79606 Pain in leg, unspecified: Secondary | ICD-10-CM | POA: Diagnosis present

## 2015-10-11 DIAGNOSIS — M5136 Other intervertebral disc degeneration, lumbar region: Secondary | ICD-10-CM

## 2015-10-11 MED ORDER — OXYCODONE HCL 15 MG PO TABS: ORAL_TABLET | ORAL | Status: DC

## 2015-10-11 NOTE — Patient Instructions (Addendum)
  PLAN   Continue present medication oxycodone. Please note that oxycodone pill is now 15 mg as discussed and as previously discussed  F/U PCP Dr. Gayland Curry for evaliation of  BP and general medical  condition   F/U surgical evaluation. Follow-up with Dr. Ellene Route as planned and as discussed to consider additional surgery At the present time does not appear that there would be a plan for additional surgery for lumbar and lower extremity pain  F/U Dr.  Sarajane Jews in [facility] status post surgery of right ear  F/U gastroenterology neurological evaluation for evaluation of hepatic condition as discussed. Discussed the medications Cymbalta, Neurontin, Lyrica, and Topamax with your physicians who are treating your liver  F/U neurological evaluation.. May consider PNCV EMG studies and other studies   May consider radiofrequency rhizolysis or intraspinal procedures pending response to present treatment and F/U evaluation . We will avoid such procedures as these procedures at this time  Patient to call Pain Management Center should patient have concerns prior to scheduled return appointment.[name] Kitchen

## 2015-10-11 NOTE — Progress Notes (Signed)
Safety precautions to be maintained throughout the outpatient stay will include: orient to surroundings, keep bed in low position, maintain call bell within reach at all times, provide assistance with transfer out of bed and ambulation.  

## 2015-10-12 NOTE — Progress Notes (Signed)
Subjective:    Patient ID: [name], male    DOB: Aug 21, 1950, 65 y.o.   MRN: [id number]  HPI  The patient is a 65 year old gentleman who returns to pain management for further evaluation and treatment of pain involving the region of the neck entire back upper and lower extremity regions. The patient is with severe lumbar lower extremity pain and has undergone reevaluation with Dr. Leandro Reasoner. His been discussion of patient undergoing additional surgery for lumbar and lower extremity pain. The patient is presently considering his treatment options. There also discussed patient's overall condition as well as patient's general medical condition with patient having history of hepatitis. We will consider patient for additional modifications of treatment pending follow-up evaluation. At the present time we will continue patient's oxycodone and we'll consider other medications as discussed as well as interventional treatment. The patient is a significant lower extremity pain with numbness as well.. We will await further assessment of patient and will consider modification of treatment regimen as discussed. All agreed to suggested treatment plan    Review of Systems     Objective:   Physical Exam  There was tends to palpation of the paraspinal muscular treat the cervical region cervical facet region a moderate degree with well-healed surgical scar of the cervical region without increased warmth and erythema in the region of the scar. Palpation of the acromioclavicular and glenohumeral joint regions reproduce moderate discomfort as well. The patient was with severe tenderness to palpation of the medial and lateral epicondyles of the elbows and was with mild discomfort with Tinel and Phalen's maneuver. Palpation over the region of the thoracic region thoracic facet region was with moderate tenderness to palpation of moderately severe tenderness to palpation associated with significant muscle spasms. The  lumbar paraspinal must reason lumbar facet region was attends to palpation of moderate degree to moderately severe degree with lateral bending and rotation reproduce severe discomfort. Straight leg raising was limited to 20. There appeared to be decreased sensation of the L5 dermatomal distribution. EHL strength was decreased. DTRs were difficult to elicit. Patient was with evidence of trace DTRs at the region of the knees. There was tends to palpation over the PSIS and PII S regions of moderate degree with tenderness to palpation greater trochanteric region iliotibial band region of mild degree. There was negative clonus negative Homans. Abdomen was without severe tenderness to palpation and no costovertebral tenderness was noted.      Assessment & Plan:    Degenerative disc disease lumbar spine Postoperative changes L3-S1, laminectomy, epidural scarring present anteriorly and thickening of epidural soft tissues no evidence of arachnoiditis. Severe neuroforaminal stenosis bilaterally, L5-S1 exiting nerve root impingement, hearing degrees with foraminal stenosis at other levels, adjacent facet degenerative joint disease and hypertrophy with possible synovitis L3-4 and L4-L5. 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 Please note that oxycodone pill is now 15 mg as discussed and as previously discussed  F/U PCP Dr. Gayland Curry for evaliation of  BP and general medical  condition   F/U surgical evaluation. Follow-up with Dr. Ellene Route as planned and as discussed to consider additional surgery At the present time does not appear that there would be a plan for additional surgery for lumbar  and lower extremity  pain  F/U Dr.  Sarajane Jews in [facility] status post surgery of right ear  F/U gastroenterology neurological evaluation for evaluation of hepatic condition as discussed. Discussed the medications Cymbalta, Neurontin, Lyrica, and Topamax with your physicians who are treating your liver  F/U neurological evaluation.. May consider [facility] EMG studies and other studies   May consider radiofrequency rhizolysis or intraspinal procedures pending response to present treatment and F/U evaluation . We will avoid such procedures as these procedures at this time  Patient to call Pain Management Center should patient have concerns prior to scheduled return appointment.[name] Kitchen

## 2015-10-30 ENCOUNTER — Ambulatory Visit: Payer: PRIVATE HEALTH INSURANCE

## 2015-10-30 ENCOUNTER — Other Ambulatory Visit: Payer: PRIVATE HEALTH INSURANCE

## 2015-11-12 ENCOUNTER — Encounter: Payer: Self-pay | Admitting: Pain Medicine

## 2015-11-12 ENCOUNTER — Ambulatory Visit: Payer: Medicare Other | Attending: Pain Medicine | Admitting: Pain Medicine

## 2015-11-12 VITALS — BP 139/83 | HR 58 | Temp 98.2°F | Resp 15 | Ht 69.0 in | Wt 215.0 lb

## 2015-11-12 DIAGNOSIS — Z9889 Other specified postprocedural states: Secondary | ICD-10-CM | POA: Insufficient documentation

## 2015-11-12 DIAGNOSIS — M533 Sacrococcygeal disorders, not elsewhere classified: Secondary | ICD-10-CM

## 2015-11-12 DIAGNOSIS — M5416 Radiculopathy, lumbar region: Secondary | ICD-10-CM

## 2015-11-12 DIAGNOSIS — T79A0XA Compartment syndrome, unspecified, initial encounter: Secondary | ICD-10-CM | POA: Diagnosis not present

## 2015-11-12 DIAGNOSIS — M5030 Other cervical disc degeneration, unspecified cervical region: Secondary | ICD-10-CM | POA: Diagnosis not present

## 2015-11-12 DIAGNOSIS — X58XXXA Exposure to other specified factors, initial encounter: Secondary | ICD-10-CM | POA: Diagnosis not present

## 2015-11-12 DIAGNOSIS — M4806 Spinal stenosis, lumbar region: Secondary | ICD-10-CM | POA: Diagnosis not present

## 2015-11-12 DIAGNOSIS — M5116 Intervertebral disc disorders with radiculopathy, lumbar region: Secondary | ICD-10-CM | POA: Insufficient documentation

## 2015-11-12 DIAGNOSIS — Z981 Arthrodesis status: Secondary | ICD-10-CM

## 2015-11-12 DIAGNOSIS — M5136 Other intervertebral disc degeneration, lumbar region: Secondary | ICD-10-CM

## 2015-11-12 DIAGNOSIS — M545 Low back pain: Secondary | ICD-10-CM | POA: Diagnosis present

## 2015-11-12 MED ORDER — OXYCODONE HCL 15 MG PO TABS: ORAL_TABLET | ORAL | Status: DC

## 2015-11-12 NOTE — Progress Notes (Signed)
Subjective:    Patient ID: [name], male    DOB: 08/28/1950, 65 y.o.   MRN: [id number]  HPI  Patient is a 65 year old gentleman who returns to pain management for further evaluation and treatment of pain involving the lower back and lower extremity region. The patient is status post surgical intervention of the cervical region and lumbar region as well as surgery of the extremities including elbow thumb. On today's visit we discussed patient's condition and patient had been considering additional surgical intervention of the lumbar region. At the present time it appears the patient will avoid additional surgery of the lumbar region. We discussed patient's medication and may consider prescribing fentanyl patch for patient as well as Cymbalta. Patient is to address these medications with his gastroenterologist and primary care physicians. We will remain available to consider patient for modification of medications as well as interventional treatment as discussed and as explained to patient on today's visit. All agreed to suggested treatment plan. Patient denied any trauma change in events of daily living the call significant change in symptomatology.       Review of Systems     Objective:   Physical Exam   There was tenderness to palpation of paraspinal musculature region cervical region cervical facet region palpation which reproduces moderate discomfort with moderate tenderness over the splenius capitis and occipitalis regions. There was tenderness of the acromioclavicular and glenohumeral joint region and patient appeared to be with decreased grip strength and Tinel and Phalen's maneuver reproducing moderate discomfort. There was tends to palpation of the medial and lateral epicondyles of the elbow There was tenderness over the thoracic facet thoracic paraspinal musculature region with no crepitus of the thoracic region noted. Palpation over the lumbar paraspinal musculatures and  lumbar facet region was attends to palpation of moderate degree with lateral bending rotation extension and palpation of the lumbar facets reproducing moderate discomfort. There was moderately severe tends to palpation with extension and palpation of the lumbar facets of the left as well as on the right. Straight leg raise was tolerates approximately 20 without definite increased pain with dorsiflexion noted. There was tenderness to palpation of the PSIS and PII S region a moderate severe degree without excessive tends to palpation of the greater trochanteric region iliotibial band region. Sensory deficit or dermatomal dystrophy she was detected. There was negative clonus negative Homans. Abdomen was nontender and no costovertebral tenderness was noted     Assessment & Plan:      Degenerative disc disease lumbar spine Postoperative changes L3-S1, laminectomy, epidural scarring present anteriorly and thickening of epidural soft tissues no evidence of arachnoiditis. Severe neuroforaminal stenosis bilaterally, L5-S1 exiting nerve root impingement, hearing degrees with foraminal stenosis at other levels, adjacent facet degenerative joint disease and hypertrophy with possible synovitis L3-4 and L4-L5. 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 We will consider fentanyl patch as well as Cymbalta and other medications as discussed   F/U PCP Dr. Gayland Curry for evaliation of  BP and general medical  condition   F/U surgical evaluation. Follow-up with Dr. Ellene Route as discussed  F/U Dr.  Sarajane Jews in [place] status post surgery of right ear  F/U gastroenterology  neurological evaluation  for evaluation of hepatic condition as discussed. Discuss the medications Cymbalta, Neurontin, Lyrica, and Topamax with your physicians who are treating your liver as we previously discussed  F/U neurological evaluation.. May consider [facility] EMG studies and other studies   May consider radiofrequency rhizolysis or intraspinal procedures pending response to present treatment and F/U evaluation . We will avoid such procedures as these procedures at this time  Patient to call Pain Management Center should patient have concerns prior to scheduled return appointment

## 2015-11-12 NOTE — Patient Instructions (Addendum)
  PLAN   Continue present medication oxycodone 15 mg size as discussed. We will consider fentanyl patch as well as Cymbalta and other medications as discussed   F/U PCP Dr. Gayland Curry for evaliation of  BP and general medical  condition   F/U surgical evaluation. Follow-up with Dr. Ellene Route as discussed  F/U Dr.  Sarajane Jews in [place] status post surgery of right ear  F/U gastroenterology neurological evaluation for evaluation of hepatic condition as discussed. Discuss the medications Cymbalta, Neurontin, Lyrica, and Topamax with your physicians who are treating your liver as we previously discussed  F/U neurological evaluation.. May consider [facility] EMG studies and other studies   May consider radiofrequency rhizolysis or intraspinal procedures pending response to present treatment and F/U evaluation . We will avoid such procedures as these procedures at this time  Patient to call Pain Management Center should patient have concerns prior to scheduled return appointment..Pain Management Discharge Instructions  General Discharge Instructions :  If you need to reach your doctor call: Monday-Friday 8:00 am - 4:00 pm at [phone] or toll free [phone].  After clinic hours [phone] to have operator reach doctor.  Bring all of your medication bottles to all your appointments in the pain clinic.  To cancel or reschedule your appointment with Pain Management please remember to call 24 hours in advance to avoid a fee.  Refer to the educational materials which you have been given on: General Risks, I had my Procedure. Discharge Instructions, Post Sedation.  Post Procedure Instructions:  The drugs you were given will stay in your system until tomorrow, so for the next 24 hours you should not drive, make any legal decisions or drink any alcoholic beverages.  You may eat anything you prefer, but it is better to start with liquids then soups and crackers, and gradually work up to  solid foods.  Please notify your doctor immediately if you have any unusual bleeding, trouble breathing or pain that is not related to your normal pain.  Depending on the type of procedure that was done, some parts of your body may feel week and/or numb.  This usually clears up by tonight or the next day.  Walk with the use of an assistive device or accompanied by an adult for the 24 hours.  You may use ice on the affected area for the first 24 hours.  Put ice in a Ziploc bag and cover with a towel and place against area 15 minutes on 15 minutes off.  You may switch to heat after 24 hours.

## 2015-11-12 NOTE — Progress Notes (Signed)
Safety precautions to be maintained throughout the outpatient stay will include: orient to surroundings, keep bed in low position, maintain call bell within reach at all times, provide assistance with transfer out of bed and ambulation.  

## 2015-11-17 LAB — TOXASSURE SELECT 13 (MW), URINE: PDF: 0

## 2015-11-18 NOTE — Progress Notes (Signed)
Quick Note:  Reviewed. ______ 

## 2015-12-05 ENCOUNTER — Ambulatory Visit: Payer: Medicare Other | Attending: Pain Medicine | Admitting: Pain Medicine

## 2015-12-05 ENCOUNTER — Encounter: Payer: Self-pay | Admitting: Pain Medicine

## 2015-12-05 VITALS — BP 130/91 | HR 63 | Temp 98.1°F | Resp 18 | Ht 69.0 in | Wt 215.0 lb

## 2015-12-05 DIAGNOSIS — M4806 Spinal stenosis, lumbar region: Secondary | ICD-10-CM | POA: Diagnosis not present

## 2015-12-05 DIAGNOSIS — Z9889 Other specified postprocedural states: Secondary | ICD-10-CM | POA: Diagnosis not present

## 2015-12-05 DIAGNOSIS — M5030 Other cervical disc degeneration, unspecified cervical region: Secondary | ICD-10-CM

## 2015-12-05 DIAGNOSIS — M5136 Other intervertebral disc degeneration, lumbar region: Secondary | ICD-10-CM

## 2015-12-05 DIAGNOSIS — M62830 Muscle spasm of back: Secondary | ICD-10-CM | POA: Diagnosis not present

## 2015-12-05 DIAGNOSIS — M47896 Other spondylosis, lumbar region: Secondary | ICD-10-CM | POA: Diagnosis not present

## 2015-12-05 DIAGNOSIS — M545 Low back pain: Secondary | ICD-10-CM | POA: Diagnosis present

## 2015-12-05 DIAGNOSIS — M51369 Other intervertebral disc degeneration, lumbar region without mention of lumbar back pain or lower extremity pain: Secondary | ICD-10-CM

## 2015-12-05 DIAGNOSIS — M533 Sacrococcygeal disorders, not elsewhere classified: Secondary | ICD-10-CM

## 2015-12-05 DIAGNOSIS — T79A0XA Compartment syndrome, unspecified, initial encounter: Secondary | ICD-10-CM | POA: Diagnosis not present

## 2015-12-05 DIAGNOSIS — M5116 Intervertebral disc disorders with radiculopathy, lumbar region: Secondary | ICD-10-CM | POA: Insufficient documentation

## 2015-12-05 DIAGNOSIS — X58XXXA Exposure to other specified factors, initial encounter: Secondary | ICD-10-CM | POA: Insufficient documentation

## 2015-12-05 DIAGNOSIS — Z981 Arthrodesis status: Secondary | ICD-10-CM

## 2015-12-05 DIAGNOSIS — M542 Cervicalgia: Secondary | ICD-10-CM | POA: Diagnosis present

## 2015-12-05 DIAGNOSIS — M5416 Radiculopathy, lumbar region: Secondary | ICD-10-CM

## 2015-12-05 MED ORDER — OXYCODONE HCL 15 MG PO TABS: ORAL_TABLET | ORAL | Status: DC

## 2015-12-05 MED ORDER — OXYCODONE HCL 20 MG PO TABS: ORAL_TABLET | ORAL | Status: DC

## 2015-12-05 NOTE — Patient Instructions (Addendum)
  PLAN   Continue present medication oxycodone PLEASE NOTE OXYCODONE IS NOW STRONGER AND IS 20MG   Medication can cause respiratory depression and cause you to stop breathing, cause excessive sedation, cause confusion and other side effects.  Exercise extreme caution when taking medication and call EMS or go to the Emergency Department immediately if you develop any of these symptoms   We will consider fentanyl patch as well as Cymbalta and other medications as discussed   F/U PCP Dr. Gayland Curry for evaliation of  BP and general medical  condition   F/U surgical evaluation. Follow-up with Dr. Ellene Route as discussed  F/U Dr.  Sarajane Jews in [place] status post surgery of right ear as needed  F/U gastroenterology neurological evaluation for evaluation of hepatic condition as discussed.   F/U neurological evaluation.. May consider [facility] EMG studies and other studies   May consider radiofrequency rhizolysis or intraspinal procedures pending response to present treatment and F/U evaluation . We will avoid such procedures as these procedures at this time  Patient to call Pain Management Center should patient have concerns prior to scheduled return appointment.

## 2015-12-05 NOTE — Progress Notes (Signed)
Subjective:    Patient ID: [name], male    DOB: 06-01-1951, 65 y.o.   MRN: [id number]  HPI  The patient is a 65 year old gentleman who returns to pain management for further evaluation and treatment of pain involving the neck upper extremity region as well as the lower back and lower extremity region. The patient is status post surgical intervention of both the cervical and lumbar regions and has recently undergone further surgical evaluation without plans to proceed with immediate surgical intervention of the lumbar region again at this time. The patient states that he continues to have significant pain of the lumbar lower extremity region. The patient is with significant hepatic condition and decision has been made to avoid interventional treatment at this time. We discussed patient's condition and we will modify patient's medication by increase oxycodone from oxycodone 15 mg to oxycodone 20 mg size and will limit patient to 180 PER month. He will also discussed implementing Cymbalta and will consider Cymbalta as well. The patient is to call pain management should they be significant change in condition or should patient have other concerns regarding condition prior to scheduled return appointment. All agreed to suggested treatment plan the patient denied any recent trauma change in events of daily living the call significant change in symptomatology and stated that his symptoms were just progressively increasing. The patient will continue follow-up evaluation with his surgeons including Dr. Ellene Route as well as with other surgeons. All agreed to suggested treatment plan  Review of Systems     Objective:   Physical Exam  There was tends to palpation of paraspinal muscular treat and cervical region cervical facet region of moderate degree with moderate tenderness of the splenius capitis and occipitalis region with patient being with limited range of motion of the cervical spine. There was  bilateral equal grip strength. There appeared to be unremarkable Spurling's maneuver. Palpation of the acromioclavicular and glenohumeral joint regions reproduce mild discomfort. Palpation over the thoracic region was attends to palpation in severe muscle spasms of the thoracic region without crepitus of the thoracic region noted. Palpation over the lumbar paraspinal musculatures and lumbar facet region was with severe tends to palpation with lateral bending rotation extension and palpation over the lumbar facets reproducing severe discomfort. Palpation of the PSIS and PII S region reproduced severe pain is well Palpation of the greater trochanteric region iliotibial band region was with mild to moderate discomfort. Straight leg raising was limited to 20 with questionably increased pain with dorsiflexion noted. There was questionably decreased sensation of the L5 dermatomal distribution with negative clonus negative Homans. EHL strength appeared to be slightly decreased. Abdomen was nontender with no costovertebral angle tenderness noted      Assessment & Plan:     Degenerative disc disease lumbar spine Postoperative changes L3-S1, laminectomy, epidural scarring present anteriorly and thickening of epidural soft tissues no evidence of arachnoiditis. Severe neuroforaminal stenosis bilaterally, L5-S1 exiting nerve root impingement, hearing degrees with foraminal stenosis at other levels, adjacent facet degenerative joint disease and hypertrophy with possible synovitis L3-4 and L4-L5. 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  Exercise extreme caution when taking medication and call EMS or go to the Emergency Department immediately if you develop any of these symptoms   We will consider fentanyl patch as well as Cymbalta and other medications as discussed   F/U PCP Dr. Gayland Curry for evaliation of  BP and general medical  condition   F/U surgical evaluation. Follow-up with Dr. Ellene Route as discussed  F/U Dr.  Sarajane Jews in [place] status post surgery of right ear as needed  F/U gastroenterology neurological evaluation for evaluation of hepatic condition as discussed.   F/U neurological evaluation.. May consider [facility] EMG studies and other studies   May consider radiofrequency rhizolysis or intraspinal procedures pending response to present treatment and F/U evaluation . We will avoid such procedures as these procedures at this time  Patient to call Pain Management Center should patient have concerns prior to scheduled return appointment.

## 2015-12-05 NOTE — Progress Notes (Signed)
Safety precautions to be maintained throughout the outpatient stay will include: orient to surroundings, keep bed in low position, maintain call bell within reach at all times, provide assistance with transfer out of bed and ambulation.  

## 2015-12-12 ENCOUNTER — Inpatient Hospital Stay: Payer: Medicare Other | Attending: Hematology and Oncology

## 2015-12-12 ENCOUNTER — Ambulatory Visit: Payer: Medicare Other | Admitting: Pain Medicine

## 2015-12-12 ENCOUNTER — Inpatient Hospital Stay: Payer: Medicare Other

## 2015-12-12 DIAGNOSIS — Z79899 Other long term (current) drug therapy: Secondary | ICD-10-CM | POA: Insufficient documentation

## 2015-12-12 DIAGNOSIS — D751 Secondary polycythemia: Secondary | ICD-10-CM | POA: Insufficient documentation

## 2015-12-12 LAB — HEMATOCRIT: HCT: 51.4 % (ref 40.0–52.0)

## 2015-12-12 LAB — HEMOGLOBIN: HEMOGLOBIN: 17.6 g/dL (ref 13.0–18.0)

## 2015-12-14 DIAGNOSIS — Z8619 Personal history of other infectious and parasitic diseases: Secondary | ICD-10-CM | POA: Insufficient documentation

## 2016-01-08 ENCOUNTER — Ambulatory Visit: Payer: Medicare Other | Attending: Pain Medicine | Admitting: Pain Medicine

## 2016-01-08 ENCOUNTER — Encounter: Payer: Self-pay | Admitting: Pain Medicine

## 2016-01-08 VITALS — BP 138/89 | HR 64 | Temp 98.1°F | Resp 16 | Ht 69.0 in | Wt 215.0 lb

## 2016-01-08 DIAGNOSIS — N486 Induration penis plastica: Secondary | ICD-10-CM

## 2016-01-08 DIAGNOSIS — Z981 Arthrodesis status: Secondary | ICD-10-CM

## 2016-01-08 DIAGNOSIS — M545 Low back pain: Secondary | ICD-10-CM | POA: Diagnosis present

## 2016-01-08 DIAGNOSIS — T79A0XA Compartment syndrome, unspecified, initial encounter: Secondary | ICD-10-CM | POA: Insufficient documentation

## 2016-01-08 DIAGNOSIS — M4806 Spinal stenosis, lumbar region: Secondary | ICD-10-CM | POA: Insufficient documentation

## 2016-01-08 DIAGNOSIS — M47896 Other spondylosis, lumbar region: Secondary | ICD-10-CM | POA: Diagnosis not present

## 2016-01-08 DIAGNOSIS — R200 Anesthesia of skin: Secondary | ICD-10-CM | POA: Diagnosis not present

## 2016-01-08 DIAGNOSIS — M4120 Other idiopathic scoliosis, site unspecified: Secondary | ICD-10-CM

## 2016-01-08 DIAGNOSIS — M62830 Muscle spasm of back: Secondary | ICD-10-CM | POA: Insufficient documentation

## 2016-01-08 DIAGNOSIS — X58XXXA Exposure to other specified factors, initial encounter: Secondary | ICD-10-CM | POA: Insufficient documentation

## 2016-01-08 DIAGNOSIS — M5116 Intervertebral disc disorders with radiculopathy, lumbar region: Secondary | ICD-10-CM | POA: Insufficient documentation

## 2016-01-08 DIAGNOSIS — Z9889 Other specified postprocedural states: Secondary | ICD-10-CM | POA: Diagnosis not present

## 2016-01-08 DIAGNOSIS — M5136 Other intervertebral disc degeneration, lumbar region: Secondary | ICD-10-CM

## 2016-01-08 DIAGNOSIS — M542 Cervicalgia: Secondary | ICD-10-CM | POA: Diagnosis present

## 2016-01-08 DIAGNOSIS — M4805 Spinal stenosis, thoracolumbar region: Secondary | ICD-10-CM

## 2016-01-08 DIAGNOSIS — M5030 Other cervical disc degeneration, unspecified cervical region: Secondary | ICD-10-CM | POA: Insufficient documentation

## 2016-01-08 DIAGNOSIS — M533 Sacrococcygeal disorders, not elsewhere classified: Secondary | ICD-10-CM

## 2016-01-08 MED ORDER — OXYCODONE HCL 15 MG PO TABS: ORAL_TABLET | ORAL | 0 refills | Status: DC

## 2016-01-08 MED ORDER — OXYCODONE HCL 20 MG PO TABS: ORAL_TABLET | ORAL | 0 refills | Status: DC

## 2016-01-08 NOTE — Progress Notes (Signed)
Safety precautions to be maintained throughout the outpatient stay will include: orient to surroundings, keep bed in low position, maintain call bell within reach at all times, provide assistance with transfer out of bed and ambulation.  

## 2016-01-08 NOTE — Patient Instructions (Addendum)
  PLAN   Continue present medication oxycodone PLEASE NOTE OXYCODONE IS NOW STRONGER AND IS 20MG   Medication can cause respiratory depression and cause you to stop breathing, cause excessive sedation, cause confusion and other side effects.  Exercise extreme caution when taking medication and call EMS or go to the Emergency Department immediately if you develop any of these symptoms    We will consider fentanyl patch as well as Cymbalta and other medications as previously discussed   F/U PCP Dr. Gayland Curry for evaliation of  BP and general medical  condition   F/U surgical evaluation. Follow-up with Dr. Ellene Route as discussed  F/U Dr.  Sarajane Jews in [place] status post surgery of right ear as needed  F/U gastroenterology neurological evaluation for evaluation of hepatic condition as discussed.   F/U neurological evaluation.. May consider [facility] EMG studies and other studies   May consider radiofrequency rhizolysis or intraspinal procedures pending response to present treatment and F/U evaluation . We will avoid such procedures as these procedures at this time  Patient to call Pain Management Center should patient have concerns prior to scheduled return appointment.Pain Management Discharge Instructions  General Discharge Instructions :  If you need to reach your doctor call: Monday-Friday 8:00 am - 4:00 pm at [phone] or toll free [phone].  After clinic hours [phone] to have operator reach doctor.  Bring all of your medication bottles to all your appointments in the pain clinic.  To cancel or reschedule your appointment with Pain Management please remember to call 24 hours in advance to avoid a fee.  Refer to the educational materials which you have been given on: General Risks, I had my Procedure. Discharge Instructions, Post Sedation.  Post Procedure Instructions:  The drugs you were given will stay in your system until tomorrow, so for the next 24 hours you  should not drive, make any legal decisions or drink any alcoholic beverages.  You may eat anything you prefer, but it is better to start with liquids then soups and crackers, and gradually work up to solid foods.  Please notify your doctor immediately if you have any unusual bleeding, trouble breathing or pain that is not related to your normal pain.  Depending on the type of procedure that was done, some parts of your body may feel week and/or numb.  This usually clears up by tonight or the next day.  Walk with the use of an assistive device or accompanied by an adult for the 24 hours.  You may use ice on the affected area for the first 24 hours.  Put ice in a Ziploc bag and cover with a towel and place against area 15 minutes on 15 minutes off.  You may switch to heat after 24 hours.

## 2016-01-08 NOTE — Progress Notes (Signed)
The patient is a 65 year old gentleman who returns to pain management for further evaluation and treatment of pain involving neck upper extremity regions entire back lower back and lower extremity regions. The patient continues to have pain and paresthesias of the lower extremities and has discussed medications including fentanyl patch. We will also address the use of Cymbalta. At the present time patient continues oxycodone and is without plans for additional surgical intervention of the cervical region of the lumbar region. We will continue with the present treatment regimen at this time and patient will inform us if he wishes to consider the fentanyl patch or proceed with interventional treatment at time of return appointment are prior to his return appointment. The patient denies any trauma change in events of daily living the call significant change in symptomatology. The patient is with severe paresthesias of the extremities and Cymbalta may be of significant benefit in addition to his oxycodone and patient decides to avoid beginning the fentanyl patch. The patient is undergo reevaluation of his hepatic condition and has been found to be with significant improvement of his condition in this regard. We will await decision patient regarding medications as well as interventional treatment as discussed All agreed to suggested treatment plan.     Physical examination  There was tenderness to palpation of the splenius capitis and occipitalis region palpation which reproduces moderate discomfort with well-healed surgical scar of the cervical region without increased warmth and erythema in the region of the scar. The patient appeared to be unremarkable Spurling's maneuver and was with bilaterally equal grip strength with tenderness to palpation of the medial and lateral epicondyles of the elbow noted to be moderate degree. Tinel and Phalen's maneuver were without increase of pain of significant degree.  Palpation over the thoracic region was with significant muscle spasms as well without crepitus of the thoracic region noted. Palpation over the lumbar region was with significantly limited range of motion of the lumbar spine with lateral bending rotation extension and palpation over the lumbar facets reproducing moderate discomfort. Straight leg raising was limited to 20 without a definite increase of pain with dorsiflexion noted. EHL strength appeared to be decreased. Palpation over the PSIS and PII S regions reproduce moderate discomfort with mild tenderness of the greater trochanteric region iliotibial band region. DTRs appeared to be trace at the knees with negative clonus negative Homans. There was question decreased sensation of the L5 dermatomal distribution.    Assessment  Degenerative disc disease lumbar spine Postoperative changes L3-S1, laminectomy, epidural scarring present anteriorly and thickening of epidural soft tissues no evidence of arachnoiditis. Severe neuroforaminal stenosis bilaterally, L5-S1 exiting nerve root impingement, hearing degrees with foraminal stenosis at other levels, adjacent facet degenerative joint disease and hypertrophy with possible synovitis L3-4 and L4-L5. No synovial effusions or abscesses  Lumbar radiculopathy  Psoas compartment syndrome  Lumbar stenosis with neurogenic claudication  Lumbar facet syndrome with severe muscle spas  Degenerative disc disease cervical spine     PLAN   Continue present medication oxycodone PLEASE NOTE OXYCODONE IS NOW STRONGER AND IS 20MG   Medication can cause respiratory depression and cause you to stop breathing, cause excessive sedation, cause confusion and other side effects.  Exercise extreme caution when taking medication and call EMS or go to the Emergency Department immediately if you develop any of these symptoms    We will consider Fentanyl Patch as well as Cymbalta and other medications as previously  discussed   F/U PCP Dr. Gayland Curry for  evaliation of  BP and general medical  condition   F/U surgical evaluation. Follow-up with Dr. Ellene Route as discussed  F/U Dr.  Sarajane Jews in [place] status post surgery of right ear as needed  F/U gastroenterology neurological evaluation for evaluation of hepatic condition as discussed. . Patient with improvement of hepatic condition according to patient's report  F/U neurological evaluation.. May consider PNCV EMG studies and other studies   May consider radiofrequency rhizolysis or intraspinal procedures pending response to present treatment and F/U evaluation . We will avoid such procedures as these procedures at this time  Patient to call [facility] Management Center should patient have concerns prior to scheduled return appointment.

## 2016-01-10 ENCOUNTER — Telehealth: Payer: Self-pay | Admitting: *Deleted

## 2016-01-10 NOTE — Telephone Encounter (Signed)
Spoke with Dr. Marius Ditch to proceed with phlebotomy as hct is above 50 per md's perimeters.  msg sent to [facility] To arrange. Moishe Spice, RN to contact pt to let him know to proceed with phlebotomy.

## 2016-01-10 NOTE — Telephone Encounter (Signed)
Pt had labs recently drawn at pcp office at [facility]. hct   leve was >52.5 (H).  Pt wants to know if he needs a phlebotomy.

## 2016-01-11 ENCOUNTER — Inpatient Hospital Stay: Payer: Medicare Other | Attending: Internal Medicine

## 2016-01-11 DIAGNOSIS — D751 Secondary polycythemia: Secondary | ICD-10-CM | POA: Insufficient documentation

## 2016-01-11 DIAGNOSIS — Z79899 Other long term (current) drug therapy: Secondary | ICD-10-CM | POA: Insufficient documentation

## 2016-02-07 ENCOUNTER — Ambulatory Visit: Payer: Medicare Other | Attending: Pain Medicine | Admitting: Pain Medicine

## 2016-02-07 ENCOUNTER — Encounter: Payer: Self-pay | Admitting: Pain Medicine

## 2016-02-07 VITALS — BP 173/97 | HR 71 | Temp 98.1°F | Resp 16 | Ht 69.0 in | Wt 215.0 lb

## 2016-02-07 DIAGNOSIS — Z9889 Other specified postprocedural states: Secondary | ICD-10-CM | POA: Insufficient documentation

## 2016-02-07 DIAGNOSIS — M62830 Muscle spasm of back: Secondary | ICD-10-CM | POA: Diagnosis not present

## 2016-02-07 DIAGNOSIS — T79A0XA Compartment syndrome, unspecified, initial encounter: Secondary | ICD-10-CM | POA: Insufficient documentation

## 2016-02-07 DIAGNOSIS — R200 Anesthesia of skin: Secondary | ICD-10-CM | POA: Diagnosis not present

## 2016-02-07 DIAGNOSIS — X58XXXA Exposure to other specified factors, initial encounter: Secondary | ICD-10-CM | POA: Diagnosis not present

## 2016-02-07 DIAGNOSIS — M5116 Intervertebral disc disorders with radiculopathy, lumbar region: Secondary | ICD-10-CM | POA: Insufficient documentation

## 2016-02-07 DIAGNOSIS — M4806 Spinal stenosis, lumbar region: Secondary | ICD-10-CM | POA: Insufficient documentation

## 2016-02-07 DIAGNOSIS — M4120 Other idiopathic scoliosis, site unspecified: Secondary | ICD-10-CM

## 2016-02-07 DIAGNOSIS — M546 Pain in thoracic spine: Secondary | ICD-10-CM | POA: Diagnosis present

## 2016-02-07 DIAGNOSIS — N486 Induration penis plastica: Secondary | ICD-10-CM

## 2016-02-07 DIAGNOSIS — M5136 Other intervertebral disc degeneration, lumbar region: Secondary | ICD-10-CM

## 2016-02-07 DIAGNOSIS — M5030 Other cervical disc degeneration, unspecified cervical region: Secondary | ICD-10-CM | POA: Diagnosis not present

## 2016-02-07 DIAGNOSIS — M51369 Other intervertebral disc degeneration, lumbar region without mention of lumbar back pain or lower extremity pain: Secondary | ICD-10-CM

## 2016-02-07 DIAGNOSIS — M533 Sacrococcygeal disorders, not elsewhere classified: Secondary | ICD-10-CM

## 2016-02-07 DIAGNOSIS — M542 Cervicalgia: Secondary | ICD-10-CM | POA: Diagnosis present

## 2016-02-07 MED ORDER — OXYCODONE HCL 20 MG PO TABS: ORAL_TABLET | ORAL | 0 refills | Status: DC

## 2016-02-07 NOTE — Progress Notes (Signed)
Safety precautions to be maintained throughout the outpatient stay will include: orient to surroundings, keep bed in low position, maintain call bell within reach at all times, provide assistance with transfer out of bed and ambulation.  

## 2016-02-07 NOTE — Progress Notes (Signed)
The patient is a 65 year old gentleman who returns to pain management for further evaluation and treatment of pain involving the region of the neck entire back upper and lower extremity region with surgical intervention above the cervical and lumbar regions. At the present time patient is without plans for additional surgical intervention of the cervical or lumbar regions. The patient continues medications including oxycodone and has recently begun Cymbalta. The patient is noted some benefit with the use of Cymbalta with a decrease in the paresthesias of the extremities. The patient had previously experienced severe burning stinging throbbing sensations of the lower extremities. We discussed interventional treatment which we will avoid due to patient's hepatic condition and general medical condition. We remain available to consider additional modifications of treatment regimen including modification of opioids pending follow-up evaluation and response to present treatment. The patient was with understanding and agreement suggested treatment plan.    Physical examination  The patient was with tenderness to palpation of paraspinal muscular treat and cervical region cervical facet region a mild degree with limited range of motion of the cervical spine. There was tenderness to palpation over the region of the splenius capitis and occipitalis region of mild to moderate degree There was well-healed surgical scar of the cervical region without increased warmth and erythema in the region scar. The patient is able to perform drop test without significant difficulty and appeared to be with unremarkable Spurling's maneuver. Palpation over the thoracic region was attends to palpation of moderate degree with moderate muscle spasms involving the thoracic paraspinal muscular region. The patient appeared to be with bilaterally equal grip strength without severe increased pain with Tinel and Phalen's maneuver. There  was tenderness to palpation of the medial and lateral epicondyles of the elbow of the right upper extremity as well as the left upper extremity.. Palpation over the region of the thoracic area was attends to palpation of the thoracic region noted. Palpation over the lumbar paraspinal musculature region was with increased pain with lateral bending rotation extension and palpation of the lumbar facets reproducing moderate discomfort. There was significant muscle spasms involving the lumbar paraspinal musculature region. Palpation of the PSIS and PII S region reproduced moderate to moderately severe discomfort with moderate tenderness of the greater trochanteric region iliotibial band region. Straight leg raise was tolerates approximately 20 with questionably increased pain with dorsiflexion noted. EHL strength appeared to be decreased. There was negative clonus negative Homans. Abdomen was with mild tenderness to palpation with no costovertebral angle tenderness noted     Assessment    Degenerative disc disease lumbar spine Postoperative changes L3-S1, laminectomy, epidural scarring present anteriorly and thickening of epidural soft tissues no evidence of arachnoiditis. Severe neuroforaminal stenosis bilaterally, L5-S1 exiting nerve root impingement, hearing degrees with foraminal stenosis at other levels, adjacent facet degenerative joint disease and hypertrophy with possible synovitis L3-4 and L4-L5. No synovial effusions or abscesses  Lumbar radiculopathy  Psoas compartment syndrome  Lumbar stenosis with neurogenic claudication  Lumbar facet syndrome with severe muscle spas  Degenerative disc disease cervical spine      PLAN   Continue present medication oxycodone PLEASE NOTE OXYCODONE IS NOW STRONGER AND IS 20MG   Medication can cause respiratory depression and cause you to stop breathing, cause excessive sedation, cause confusion and other side effects.  Exercise extreme caution when  taking medication and call EMS or go to the Emergency Department immediately if you develop any of these symptoms  We will consider suggesting an increase  of Cymbalta pending response to the present Cymbalta dose and follow-up evaluation.    F/U PCP Dr. Gayland Curry for evaliation of  BP and general medical  condition   F/U surgical evaluation. Follow-up with Dr. Ellene Route as discussed  F/U Dr.  Sarajane Jews in [place] status post surgery of right ear as needed  F/U gastroenterology neurological evaluation for evaluation of hepatic condition as discussed.   F/U neurological evaluation.. May consider PNCV EMG studies and other studies   May consider radiofrequency rhizolysis or intraspinal procedures pending response to present treatment and F/U evaluation . We will avoid such procedures as these procedures at this time  Patient to call Pain Management Center should patient have concerns prior to scheduled return appointment.

## 2016-02-07 NOTE — Patient Instructions (Signed)
  PLAN   Continue present medication oxycodone PLEASE NOTE OXYCODONE IS NOW STRONGER AND IS 20MG   Medication can cause respiratory depression and cause you to stop breathing, cause excessive sedation, cause confusion and other side effects.  Exercise extreme caution when taking medication and call EMS or go to the Emergency Department immediately if you develop any of these symptoms    We will consider fentanyl patch as well as Cymbalta and other medications as previously discussed   F/U PCP Dr. Gayland Curry for evaliation of  BP and general medical  condition   F/U surgical evaluation. Follow-up with Dr. Ellene Route as discussed  F/U Dr.  Sarajane Jews in [place] status post surgery of right ear as needed  F/U gastroenterology neurological evaluation for evaluation of hepatic condition as discussed.   F/U neurological evaluation.. May consider PNCV EMG studies and other studies   May consider radiofrequency rhizolysis or intraspinal procedures pending response to present treatment and F/U evaluation . We will avoid such procedures as these procedures at this time  Patient to call Pain Management Center should patient have concerns prior to scheduled return appointment.

## 2016-02-12 ENCOUNTER — Other Ambulatory Visit: Payer: Medicare Other

## 2016-02-12 ENCOUNTER — Ambulatory Visit: Payer: Medicare Other | Admitting: Hematology and Oncology

## 2016-02-12 ENCOUNTER — Ambulatory Visit: Payer: Medicare Other

## 2016-02-14 ENCOUNTER — Inpatient Hospital Stay: Payer: Medicare Other

## 2016-02-14 ENCOUNTER — Other Ambulatory Visit: Payer: Self-pay

## 2016-02-14 ENCOUNTER — Encounter: Payer: Self-pay | Admitting: Internal Medicine

## 2016-02-14 ENCOUNTER — Inpatient Hospital Stay: Payer: Medicare Other | Attending: Internal Medicine | Admitting: Internal Medicine

## 2016-02-14 DIAGNOSIS — B1920 Unspecified viral hepatitis C without hepatic coma: Secondary | ICD-10-CM | POA: Insufficient documentation

## 2016-02-14 DIAGNOSIS — R51 Headache: Secondary | ICD-10-CM | POA: Diagnosis not present

## 2016-02-14 DIAGNOSIS — G629 Polyneuropathy, unspecified: Secondary | ICD-10-CM | POA: Diagnosis not present

## 2016-02-14 DIAGNOSIS — D751 Secondary polycythemia: Secondary | ICD-10-CM

## 2016-02-14 DIAGNOSIS — Z7982 Long term (current) use of aspirin: Secondary | ICD-10-CM | POA: Insufficient documentation

## 2016-02-14 DIAGNOSIS — I10 Essential (primary) hypertension: Secondary | ICD-10-CM | POA: Insufficient documentation

## 2016-02-14 DIAGNOSIS — K7460 Unspecified cirrhosis of liver: Secondary | ICD-10-CM | POA: Diagnosis not present

## 2016-02-14 DIAGNOSIS — Z79899 Other long term (current) drug therapy: Secondary | ICD-10-CM | POA: Insufficient documentation

## 2016-02-14 DIAGNOSIS — K219 Gastro-esophageal reflux disease without esophagitis: Secondary | ICD-10-CM | POA: Insufficient documentation

## 2016-02-14 DIAGNOSIS — M5136 Other intervertebral disc degeneration, lumbar region: Secondary | ICD-10-CM | POA: Diagnosis not present

## 2016-02-14 DIAGNOSIS — R490 Dysphonia: Secondary | ICD-10-CM | POA: Diagnosis not present

## 2016-02-14 LAB — CBC WITH DIFFERENTIAL/PLATELET
BASOS PCT: 1 %
Basophils Absolute: 0 10*3/uL (ref 0–0.1)
EOS ABS: 0.2 10*3/uL (ref 0–0.7)
Eosinophils Relative: 3 %
HEMATOCRIT: 52.6 % — AB (ref 40.0–52.0)
HEMOGLOBIN: 18.1 g/dL — AB (ref 13.0–18.0)
Lymphocytes Relative: 29 %
Lymphs Abs: 1.6 10*3/uL (ref 1.0–3.6)
MCH: 31.5 pg (ref 26.0–34.0)
MCHC: 34.4 g/dL (ref 32.0–36.0)
MCV: 91.7 fL (ref 80.0–100.0)
Monocytes Absolute: 0.8 10*3/uL (ref 0.2–1.0)
Monocytes Relative: 14 %
NEUTROS ABS: 3.1 10*3/uL (ref 1.4–6.5)
NEUTROS PCT: 53 %
Platelets: 196 10*3/uL (ref 150–440)
RBC: 5.74 MIL/uL (ref 4.40–5.90)
RDW: 15.5 % — ABNORMAL HIGH (ref 11.5–14.5)
WBC: 5.7 10*3/uL (ref 3.8–10.6)

## 2016-02-14 NOTE — Progress Notes (Signed)
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 Patient has been getting intermittent phlebotomies in the last many months.  Patient complains of a headache; complains of hoarseness of voice denies any difficulty swallowing or pain with swallowing. Patient takes allergy medication.[name] Kitchen   He denies any burning pain in his hand and feet. No history of blood clots and strokes.Chronic back pain not any worse.   REVIEW OF SYSTEMS:  A complete 10 point review of system is done which is negative except mentioned above/history of present illness.   PAST MEDICAL HISTORY :  Past Medical History:  Diagnosis Date  . Arthritis   . Back pain    MULTIPLE BACK SURGERIES  . Benign essential HTN 12/07/2014   Last Assessment & Plan:  Relevant Hx: Course: Daily Update: Today's Plan: CONTROLLED ON MEDS  . 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/31/2013  . Chronic back pain   . 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 Cirrhosis ([place])   . DDD (degenerative disc disease), cervical 11/06/2014  . DDD (degenerative disc disease), lumbar 11/06/2014  . 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 Erythrocytosis   . Esophagitis, reflux 12/07/2014  . GERD (gastroesophageal reflux disease)   . HCV (hepatitis C virus) 12/07/2014  . Hepatitis C    DID HARVONI TREATMENT/ NO LONGER HEP C POSITIVE  . Idiopathic scoliosis and kyphoscoliosis 03/01/2013  . Neuropathy of both feet ([place])   . Painful legs and moving toes    DUE TO BACK  PROBLEMS  . Prostatitis   . Sacroiliac joint dysfunction 11/06/2014  . Spinal stenosis 12/07/2014  . Testicular dysfunction 11/04/2011   Overview:  Dr. Jacqlyn Larsen is his urologist.     PAST SURGICAL HISTORY :   Past Surgical History:  Procedure Laterality Date  . BACK SURGERY  04/14/13   L2-S1  . 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 CATARACT EXTRACTION Left 1989  . CERVICAL SPINE SURGERY      X2 FUSION C4-7  . CHOLECYSTECTOMY  07/2013  . COLONOSCOPY WITH PROPOFOL N/A 12/18/2014   Procedure: COLONOSCOPY WITH PROPOFOL;  Surgeon: Lucilla Lame, MD;  Location: [facility];  Service: Endoscopy;  Laterality: N/A;  . ELBOW ARTHROSCOPY Right 02/2012  . ESOPHAGOGASTRODUODENOSCOPY (EGD) WITH PROPOFOL N/A 12/18/2014   Procedure: ESOPHAGOGASTRODUODENOSCOPY (EGD) WITH PROPOFOL with dialtion;  Surgeon: Lucilla Lame, MD;  Location: [facility];  Service: Endoscopy;  Laterality: N/A;  . FACIAL RECONSTRUCTION SURGERY  1970   BOTTLE CUT NOSE AND EYE, MULTIPLE SURGERIES  . FINGER SURGERY Left 2015   THUMB  . FINGER SURGERY Right 1966   MIDDLE  . FOOT SURGERY Right 1984  . FUNDIPLICATION  99991111  . HERNIA REPAIR Left 1981/1957   RIGHT SIDE IN [phone]  . MASS EXCISION Left 02/16/2015   Procedure: LEFT BURSA ELBOW EXCISION;  Surgeon: Leanor Kail, MD;  Location: [facility];  Service: Orthopedics;  Laterality: Left;  . NISSEN FUNDOPLICATION  99991111  . NOSE SURGERY  12/01/02   SEPTUM RECONSTRUCTION  . POLYPECTOMY  12/18/2014   Procedure: POLYPECTOMY;  Surgeon: Lucilla Lame, MD;  Location: [facility];  Service: Endoscopy;;  . SEPTOPLASTY  1999  . SHOULDER SURGERY Right 1983  . SHOULDER SURGERY Left 1979   WITH ELBOW (TENDON RELEASE)  . SKIN SURGERY  2004   SKIN CANCER NOSE    FAMILY HISTORY :   Family History  Problem Relation Age of Onset  . Arthritis Mother   . Asthma Mother   . Cancer Mother   . Heart disease Father     SOCIAL HISTORY:   Social History  Substance Use Topics   . Smoking status: Never Smoker  . Smokeless tobacco: Never Used  . Alcohol use 0.6 oz/week    1 Glasses of wine per week    ALLERGIES:  is allergic to no known allergies.  MEDICATIONS:  Current Outpatient Prescriptions  Medication Sig Dispense Refill  . amLODipine (NORVASC) 5 MG tablet Take 1 tablet by mouth daily.    [name] Kitchen aspirin 81 MG tablet Take 81 mg by mouth daily.    . DULoxetine (CYMBALTA) 20 MG capsule Take 30 mg by mouth daily.     . furosemide (LASIX) 40 MG tablet Take 40 mg by mouth as needed. prn    . loratadine (CLARITIN) 10 MG tablet Take 10 mg by mouth daily. AM    . magnesium citrate SOLN Take 15 mLs by mouth daily. Reported on 06/05/2015    . naloxone (NARCAN) 0.4 MG/ML injection Inject 0.4 mg into the muscle as needed. Reported on 08/08/2015    . NIACIN PO Supplemental powder-reconstituted    . oxyCODONE (ROXICODONE) 15 MG immediate release tablet Limit 1 tab by mouth 3-6 times per day if tolerated. NOTE.PILL IS NOW 15MG   AND MUCH STRONGER 180 tablet 0  . Oxycodone HCl 20 MG TABS Limit one half to one tab by mouth 3-6 times per day if tolerated   NOTE OXYCODONE IS NOW 20 MG 180 tablet 0  . ranitidine (ZANTAC) 75 MG tablet Take 75 mg by mouth daily.    [name] Kitchen spironolactone (ALDACTONE) 25 MG tablet Take 25 mg by mouth daily. AM    . tamsulosin (FLOMAX) 0.4 MG CAPS capsule Take 0.4 mg by mouth daily. AM    . testosterone cypionate (DEPOTESTOTERONE CYPIONATE) 200 MG/ML injection Inject 100 mg into the muscle every 7 (seven) days.     No current facility-administered medications for this visit.     PHYSICAL EXAMINATION:   BP (!) 153/105 (BP Location: Left Arm, Patient Position: Sitting)   Pulse 72   Temp (!) 96.8 F (36 C) (Tympanic)   Ht 5\' 9"  (1.753 m)   Wt 222 lb 9.6 oz (101 kg)   BMI 32.87 kg/m   Filed Weights   02/14/16 1400  Weight: 222 lb 9.6 oz (101 kg)    GENERAL: Well-nourished well-developed; Alert, no distress and comfortable.   Alone.  EYES: no pallor  or icterus OROPHARYNX: no thrush or ulceration; good dentition  NECK: supple, no masses felt LYMPH:  no palpable lymphadenopathy in the cervical, axillary or inguinal regions LUNGS: clear to auscultation and  No wheeze or crackles HEART/CVS: regular rate & rhythm and no murmurs; No lower extremity edema ABDOMEN:abdomen soft, non-tender and normal bowel sounds Musculoskeletal:no cyanosis of digits and no clubbing  PSYCH: alert & oriented x 3 with fluent speech NEURO: no focal motor/sensory deficits SKIN:  no rashes or significant lesions  LABORATORY DATA:  I have reviewed the data as listed  Component Value Date/Time   NA 136 08/08/2015 1311   NA 137 02/22/2012 0645   K 4.0 08/08/2015 1311   K 4.6 02/22/2012 0645   CL 103 08/08/2015 1311   CL 103 02/22/2012 0645   CO2 27 08/08/2015 1311   CO2 30 02/22/2012 0645   GLUCOSE 113 (H) 08/08/2015 1311   GLUCOSE 90 02/22/2012 0645   BUN 14 08/08/2015 1311   BUN 9 02/22/2012 0645   CREATININE 1.05 08/08/2015 1311   CREATININE 0.97 02/22/2012 0645   CALCIUM 8.6 (L) 08/08/2015 1311   CALCIUM 8.5 02/22/2012 0645   PROT 7.7 08/08/2015 1311   PROT 6.9 02/22/2012 0645   ALBUMIN 4.0 08/08/2015 1311   ALBUMIN 3.1 (L) 02/22/2012 0645   AST 44 (H) 08/08/2015 1311   AST 80 (H) 02/22/2012 0645   ALT 32 08/08/2015 1311   ALT 96 (H) 02/22/2012 0645   ALKPHOS 53 08/08/2015 1311   ALKPHOS 47 (L) 02/22/2012 0645   BILITOT 0.7 08/08/2015 1311   BILITOT 0.3 02/22/2012 0645   GFRNONAA >60 08/08/2015 1311   GFRNONAA >60 02/22/2012 0645   GFRAA >60 08/08/2015 1311   GFRAA >60 02/22/2012 0645    No results found for: SPEP, UPEP  Lab Results  Component Value Date   WBC 5.7 02/14/2016   NEUTROABS 3.1 02/14/2016   HGB 18.1 (H) 02/14/2016   HCT 52.6 (H) 02/14/2016   MCV 91.7 02/14/2016   PLT 196 02/14/2016      Chemistry      Component Value Date/Time   NA 136 08/08/2015 1311   NA 137 02/22/2012 0645   K 4.0 08/08/2015 1311   K 4.6  02/22/2012 0645   CL 103 08/08/2015 1311   CL 103 02/22/2012 0645   CO2 27 08/08/2015 1311   CO2 30 02/22/2012 0645   BUN 14 08/08/2015 1311   BUN 9 02/22/2012 0645   CREATININE 1.05 08/08/2015 1311   CREATININE 0.97 02/22/2012 0645      Component Value Date/Time   CALCIUM 8.6 (L) 08/08/2015 1311   CALCIUM 8.5 02/22/2012 0645   ALKPHOS 53 08/08/2015 1311   ALKPHOS 47 (L) 02/22/2012 0645   AST 44 (H) 08/08/2015 1311   AST 80 (H) 02/22/2012 0645   ALT 32 08/08/2015 1311   ALT 96 (H) 02/22/2012 0645   BILITOT 0.7 08/08/2015 1311   BILITOT 0.3 02/22/2012 0645        ASSESSMENT & PLAN:   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 Headaches.   # recommend phlebotomy q 2 months if HCT > 50.   # Hoarseness of voice- recommend ENT - Dr.McQueen.   # recheck  H&H every 2 months/phlebotomy; follow up with me in 6 months.      Cammie Sickle, MD 02/14/2016 2:21 PM

## 2016-02-14 NOTE — Progress Notes (Signed)
Pt reports having allergies scratchy throat, that has been going on a month if not more.[name] Kitchen

## 2016-02-14 NOTE — Assessment & Plan Note (Signed)
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 Headaches.   # recommend phlebotomy q 2 months if HCT > 50.   # Hoarseness of voice- recommend ENT - Dr.McQueen.   # recheck  H&H every 2 months/phlebotomy; follow up with me in 6 months.

## 2016-04-09 ENCOUNTER — Other Ambulatory Visit: Payer: Self-pay | Admitting: Pain Medicine

## 2016-04-15 ENCOUNTER — Inpatient Hospital Stay: Payer: Medicare Other | Attending: Internal Medicine

## 2016-04-15 ENCOUNTER — Inpatient Hospital Stay: Payer: Medicare Other

## 2016-04-15 DIAGNOSIS — D751 Secondary polycythemia: Secondary | ICD-10-CM | POA: Insufficient documentation

## 2016-04-15 LAB — HEMOGLOBIN AND HEMATOCRIT, BLOOD
HCT: 50.7 % (ref 40.0–52.0)
Hemoglobin: 17.2 g/dL (ref 13.0–18.0)

## 2016-04-18 ENCOUNTER — Emergency Department
Admission: EM | Admit: 2016-04-18 | Discharge: 2016-04-18 | Disposition: A | Discharge status: Home / Self Care | Payer: Medicare Other | Attending: Emergency Medicine | Admitting: Emergency Medicine

## 2016-04-18 ENCOUNTER — Emergency Department: Payer: Medicare Other

## 2016-04-18 ENCOUNTER — Encounter: Payer: Self-pay | Admitting: Emergency Medicine

## 2016-04-18 DIAGNOSIS — I10 Essential (primary) hypertension: Secondary | ICD-10-CM | POA: Diagnosis not present

## 2016-04-18 DIAGNOSIS — Z79899 Other long term (current) drug therapy: Secondary | ICD-10-CM | POA: Insufficient documentation

## 2016-04-18 DIAGNOSIS — M7989 Other specified soft tissue disorders: Secondary | ICD-10-CM | POA: Diagnosis present

## 2016-04-18 DIAGNOSIS — R05 Cough: Secondary | ICD-10-CM

## 2016-04-18 DIAGNOSIS — R059 Cough, unspecified: Secondary | ICD-10-CM

## 2016-04-18 DIAGNOSIS — Z85828 Personal history of other malignant neoplasm of skin: Secondary | ICD-10-CM | POA: Insufficient documentation

## 2016-04-18 DIAGNOSIS — R609 Edema, unspecified: Secondary | ICD-10-CM | POA: Insufficient documentation

## 2016-04-18 DIAGNOSIS — Z7982 Long term (current) use of aspirin: Secondary | ICD-10-CM | POA: Insufficient documentation

## 2016-04-18 DIAGNOSIS — R0602 Shortness of breath: Secondary | ICD-10-CM | POA: Diagnosis not present

## 2016-04-18 LAB — COMPREHENSIVE METABOLIC PANEL
ALBUMIN: 4 g/dL (ref 3.5–5.0)
ALK PHOS: 58 U/L (ref 38–126)
ALT: 54 U/L (ref 17–63)
ANION GAP: 7 (ref 5–15)
AST: 85 U/L — ABNORMAL HIGH (ref 15–41)
BUN: 18 mg/dL (ref 6–20)
CHLORIDE: 99 mmol/L — AB (ref 101–111)
CO2: 31 mmol/L (ref 22–32)
Calcium: 8.7 mg/dL — ABNORMAL LOW (ref 8.9–10.3)
Creatinine, Ser: 0.98 mg/dL (ref 0.61–1.24)
GFR calc non Af Amer: >60 mL/min (ref >60)
GLUCOSE: 103 mg/dL — AB (ref 65–99)
POTASSIUM: 3.4 mmol/L — AB (ref 3.5–5.1)
SODIUM: 137 mmol/L (ref 135–145)
Total Bilirubin: 0.8 mg/dL (ref 0.3–1.2)
Total Protein: 7.5 g/dL (ref 6.5–8.1)

## 2016-04-18 LAB — CBC WITH DIFFERENTIAL/PLATELET
BASOS PCT: 1 %
Basophils Absolute: 0 10*3/uL (ref 0–0.1)
EOS ABS: 0.2 10*3/uL (ref 0–0.7)
EOS PCT: 4 %
HCT: 50.7 % (ref 40.0–52.0)
HEMOGLOBIN: 17.5 g/dL (ref 13.0–18.0)
Lymphocytes Relative: 21 %
Lymphs Abs: 1.1 10*3/uL (ref 1.0–3.6)
MCH: 32.1 pg (ref 26.0–34.0)
MCHC: 34.5 g/dL (ref 32.0–36.0)
MCV: 93.1 fL (ref 80.0–100.0)
MONOS PCT: 23 %
Monocytes Absolute: 1.3 10*3/uL — ABNORMAL HIGH (ref 0.2–1.0)
NEUTROS PCT: 51 %
Neutro Abs: 2.8 10*3/uL (ref 1.4–6.5)
PLATELETS: 176 10*3/uL (ref 150–440)
RBC: 5.44 MIL/uL (ref 4.40–5.90)
RDW: 13.9 % (ref 11.5–14.5)
WBC: 5.5 10*3/uL (ref 3.8–10.6)

## 2016-04-18 LAB — BRAIN NATRIURETIC PEPTIDE: B Natriuretic Peptide: 31 pg/mL (ref 0.0–100.0)

## 2016-04-18 LAB — TROPONIN I

## 2016-04-18 MED ORDER — AZITHROMYCIN 500 MG PO TABS
500.0000 mg | ORAL_TABLET | Freq: Once | ORAL | Status: AC
  Administered 2016-04-18: 500 mg via ORAL
  Filled 2016-04-18: qty 1

## 2016-04-18 MED ORDER — HYDROCOD POLST-CPM POLST ER 10-8 MG/5ML PO SUER: 5.0000 mL | Freq: Every evening | ORAL | 0 refills | Status: DC | PRN

## 2016-04-18 MED ORDER — FUROSEMIDE 10 MG/ML IJ SOLN
20.0000 mg | Freq: Once | INTRAMUSCULAR | Status: AC
  Administered 2016-04-18: 20 mg via INTRAVENOUS
  Filled 2016-04-18: qty 4

## 2016-04-18 MED ORDER — AZITHROMYCIN 250 MG PO TABS: ORAL_TABLET | ORAL | 0 refills | Status: DC

## 2016-04-18 NOTE — ED Provider Notes (Signed)
[facility] Emergency Department Provider Note  ____________________________________________  Time seen: Approximately 8:46 AM  I have reviewed the triage vital signs and the nursing notes.   HISTORY  Chief Complaint Leg Swelling and Cough   HPI [name] is a 65 y.o. male the history of the hepatitis C status post treatment, hypertension, chronic back pain who presents for evaluation of coughing. Patient reports 2 weeks of severe paroxysmal bouts of coughing. He reports that the cough feels junky but he is not bringing anything up. The cough was preceded by URI symptoms. He has had no chest pain, fever, chills, nausea, vomiting, abdominal pain, diarrhea. He also reports a history of lower extremity edema for the last few days has noted worsening edema on bilateral lower extremities. Patient reports a chronic petechial rash on his lower extremities that usually gets worse when the edema sets in. He has Lasix when necessary that he takes at home and has taken 2 yesterday with improvement of the edema. He denies orthopnea. Patient has no personal or family history of ischemic heart disease and denies having any chest pain. He also denies shortness of breath.  Past Medical History:  Diagnosis Date  . Arthritis   . Back pain    MULTIPLE BACK SURGERIES  . Benign essential HTN 12/07/2014   Last Assessment & Plan:  Relevant Hx: Course: Daily Update: Today's Plan: CONTROLLED ON MEDS  . 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/31/2013  . Chronic back pain   . 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 Cirrhosis ([place])   . DDD (degenerative disc disease), cervical 11/06/2014  . DDD (degenerative disc disease), lumbar 11/06/2014  . 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 Erythrocytosis   . Esophagitis, reflux 12/07/2014    . GERD (gastroesophageal reflux disease)   . HCV (hepatitis C virus) 12/07/2014  . Hepatitis C    DID HARVONI TREATMENT/ NO LONGER HEP C POSITIVE  . Idiopathic scoliosis and kyphoscoliosis 03/01/2013  . Neuropathy of both feet   . Painful legs and moving toes    DUE TO BACK PROBLEMS  . Prostatitis   . Sacroiliac joint dysfunction 11/06/2014  . Spinal stenosis 12/07/2014  . Testicular dysfunction 11/04/2011   Overview:  Dr. Jacqlyn Larsen is his urologist.     Patient Active Problem List   Diagnosis Date Noted  . Difficulty in swallowing   . Gastritis   . Ectopic gastric tissue   . Hx of colonic polyps   . Benign neoplasm of cecum   . First degree hemorrhoids   . Esophagitis, reflux 12/07/2014  . Can't get food down 12/07/2014  . Benign essential HTN 12/07/2014  . HCV (hepatitis C virus) 12/07/2014  . Acid reflux 12/07/2014  . Apnea, sleep 12/07/2014  . Spinal stenosis 12/07/2014  . 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 DDD (degenerative disc disease), lumbar 11/06/2014  . DDD (degenerative disc disease), cervical 11/06/2014  . Status post cervical spinal fusion 11/06/2014  . Status post lumbar laminectomy 11/06/2014  . Sacroiliac joint dysfunction 11/06/2014  . 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/31/2013  . Idiopathic scoliosis and kyphoscoliosis 03/01/2013  . Testicular dysfunction 11/04/2011    Past Surgical History:  Procedure Laterality Date  .  BACK SURGERY  04/14/13   L2-S1  . 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 CATARACT EXTRACTION Left 1989  . CERVICAL SPINE SURGERY      X2 FUSION C4-7  . CHOLECYSTECTOMY  07/2013  . COLONOSCOPY WITH PROPOFOL N/A 12/18/2014   Procedure: COLONOSCOPY WITH PROPOFOL;  Surgeon: Lucilla Lame, MD;  Location: [facility];  Service: Endoscopy;  Laterality: N/A;  . ELBOW  ARTHROSCOPY Right 02/2012  . ESOPHAGOGASTRODUODENOSCOPY (EGD) WITH PROPOFOL N/A 12/18/2014   Procedure: ESOPHAGOGASTRODUODENOSCOPY (EGD) WITH PROPOFOL with dialtion;  Surgeon: Lucilla Lame, MD;  Location: [place];  Service: Endoscopy;  Laterality: N/A;  . FACIAL RECONSTRUCTION SURGERY  1970   BOTTLE CUT NOSE AND EYE, MULTIPLE SURGERIES  . FINGER SURGERY Left 2015   THUMB  . FINGER SURGERY Right 1966   MIDDLE  . FOOT SURGERY Right 1984  . FUNDIPLICATION  99991111  . HERNIA REPAIR Left 1981/1957   RIGHT SIDE IN [phone]  . MASS EXCISION Left 02/16/2015   Procedure: LEFT BURSA ELBOW EXCISION;  Surgeon: Leanor Kail, MD;  Location: [place];  Service: Orthopedics;  Laterality: Left;  . NISSEN FUNDOPLICATION  99991111  . NOSE SURGERY  12/01/02   SEPTUM RECONSTRUCTION  . POLYPECTOMY  12/18/2014   Procedure: POLYPECTOMY;  Surgeon: Lucilla Lame, MD;  Location: [place];  Service: Endoscopy;;  . SEPTOPLASTY  1999  . SHOULDER SURGERY Right 1983  . SHOULDER SURGERY Left 1979   WITH ELBOW (TENDON RELEASE)  . SKIN SURGERY  2004   SKIN CANCER NOSE    Prior to Admission medications   Medication Sig Start Date End Date Taking? Authorizing Provider  amLODipine (NORVASC) 5 MG tablet Take 1 tablet by mouth daily. 07/14/15   Historical Provider, MD  aspirin 81 MG tablet Take 81 mg by mouth daily.    Historical Provider, MD  azithromycin (ZITHROMAX) 250 MG tablet Take 1 a day for 4 days 04/18/16   Rudene Re, MD  DULoxetine (CYMBALTA) 20 MG capsule Take 30 mg by mouth daily.     Historical Provider, MD  furosemide (LASIX) 40 MG tablet Take 40 mg by mouth as needed. prn    Historical Provider, MD  loratadine (CLARITIN) 10 MG tablet Take 10 mg by mouth daily. AM    Historical Provider, MD  magnesium citrate SOLN Take 15 mLs by mouth daily. Reported on 06/05/2015    Historical Provider, MD  naloxone [facility]) 0.4 MG/ML injection Inject 0.4 mg into the muscle as needed. Reported on  08/08/2015    Historical Provider, MD  NIACIN PO Supplemental powder-reconstituted 07/11/15   Historical Provider, MD  oxyCODONE (ROXICODONE) 15 MG immediate release tablet Limit 1 tab by mouth 3-6 times per day if tolerated. NOTE.PILL IS NOW 15MG   AND MUCH STRONGER 01/08/16   Mohammed Kindle, MD  Oxycodone HCl 20 MG TABS Limit one half to one tab by mouth 3-6 times per day if tolerated   NOTE OXYCODONE IS NOW 20 MG 02/07/16   Mohammed Kindle, MD  ranitidine (ZANTAC) 75 MG tablet Take 75 mg by mouth daily.    Historical Provider, MD  spironolactone (ALDACTONE) 25 MG tablet Take 25 mg by mouth daily. AM    Historical Provider, MD  tamsulosin (FLOMAX) 0.4 MG CAPS capsule Take 0.4 mg by mouth daily. AM    Historical Provider, MD  testosterone cypionate (DEPOTESTOTERONE CYPIONATE) 200 MG/ML injection Inject 100 mg into the muscle every 7 (seven) days.    Historical Provider,  MD    Allergies No known allergies  Family History  Problem Relation Age of Onset  . Arthritis Mother   . Asthma Mother   . Cancer Mother   . Heart disease Father     Social History Social History  Substance Use Topics  . Smoking status: Never Smoker  . Smokeless tobacco: Never Used  . Alcohol use 0.6 oz/week    1 Glasses of wine per week    Review of Systems  Constitutional: Negative for fever. Eyes: Negative for visual changes. ENT: Negative for sore throat. Cardiovascular: Negative for chest pain. Respiratory: Negative for shortness of breath. + cough Gastrointestinal: Negative for abdominal pain, vomiting or diarrhea. Genitourinary: Negative for dysuria. Musculoskeletal: Negative for back pain. + b/l leg swelling Skin: Negative for rash. Neurological: Negative for headaches, weakness or numbness.  ____________________________________________   PHYSICAL EXAM:  VITAL SIGNS: ED Triage Vitals  Enc Vitals Group     BP 04/18/16 0713 (!) 151/95     Pulse Rate 04/18/16 0713 67     Resp 04/18/16 0713 (!) 22      Temp 04/18/16 0713 98.4 F (36.9 C)     Temp Source 04/18/16 0713 Oral     SpO2 04/18/16 0713 92 %     Weight 04/18/16 0714 215 lb (97.5 kg)     Height 04/18/16 0714 5\' 9"  (1.753 m)     Head Circumference --      Peak Flow --      Pain Score --      Pain Loc --      Pain Edu? --      Excl. in [place]? --     Constitutional: Alert and oriented. Well appearing and in no apparent distress. HEENT:      Head: Normocephalic and atraumatic.         Eyes: Conjunctivae are normal. Sclera is non-icteric. EOMI. PERRL      Mouth/Throat: Mucous membranes are moist.       Neck: Supple with no signs of meningismus. Cardiovascular: Regular rate and rhythm. No murmurs, gallops, or rubs. 2+ symmetrical distal pulses are present in all extremities. No JVD. Respiratory: Normal respiratory effort. Lungs are clear to auscultation bilaterally. No wheezes, crackles, or rhonchi.  Gastrointestinal: Soft, non tender, and non distended with positive bowel sounds. No rebound or guarding. Musculoskeletal: 1+ pitting edema on bilateral lower extremities from mid shin down with overlying petechial rash Neurologic: Normal speech and language. Face is symmetric. Moving all extremities. No gross focal neurologic deficits are appreciated. Skin: Skin is warm, dry and intact. No rash noted. Psychiatric: Mood and affect are normal. Speech and behavior are normal.  ____________________________________________   LABS (all labs ordered are listed, but only abnormal results are displayed)  Labs Reviewed  CBC WITH DIFFERENTIAL/PLATELET - Abnormal; Notable for the following:       Result Value   Monocytes Absolute 1.3 (*)    All other components within normal limits  COMPREHENSIVE METABOLIC PANEL - Abnormal; Notable for the following:    Potassium 3.4 (*)    Chloride 99 (*)    Glucose, Bld 103 (*)    Calcium 8.7 (*)    AST 85 (*)    All other components within normal limits  BRAIN NATRIURETIC PEPTIDE  TROPONIN I    ____________________________________________  EKG  ED ECG REPORT I, Rudene Re, the attending physician, personally viewed and interpreted this ECG.  Sinus bradycardia, rate of 55, normal intervals, normal axis, no ST  elevations or depressions. ____________________________________________  RADIOLOGY  CXR: No acute findigns ____________________________________________   PROCEDURES  Procedure(s) performed: None Procedures Critical Care performed:  None ____________________________________________   INITIAL IMPRESSION / ASSESSMENT AND PLAN / ED COURSE  65 y.o. male the history of the hepatitis C status post treatment, hypertension, chronic back pain who presents for evaluation of severe cough x2 weeks preceded by URI symptoms concerning for possible pertussis. Patient is well-appearing, in no distress, has normal work of breathing, lungs are clear to auscultation. Patient does have mild pitting edema of his lower extremities and a petechial rash which according to him are chronic in nature. His platelets are within normal limits. Patient's BNP within normal limits. Troponin is negative. Chest x-ray with no evidence of pneumonia or pulmonary edema. Patient has normal white count and is afebrile. Patient was started on azithromycin and given 20 mg of IV Lasix. We'll discharge home on 4 more days of azithromycin and follow up with PCP.  Clinical Course     Pertinent labs & imaging results that were available during my care of the patient were reviewed by me and considered in my medical decision making (see chart for details).    ____________________________________________   FINAL CLINICAL IMPRESSION(S) / ED DIAGNOSES  Final diagnoses:  Cough  Peripheral edema      NEW MEDICATIONS STARTED DURING THIS VISIT:  New Prescriptions   AZITHROMYCIN (ZITHROMAX) 250 MG TABLET    Take 1 a day for 4 days     Note:  This document was prepared using Dragon voice  recognition software and may include unintentional dictation errors.    Rudene Re, MD 04/18/16 1020

## 2016-04-18 NOTE — ED Triage Notes (Signed)
Patient presents to [facility] ED with c/o chest congestion, productive cough, and leg swelling. Patient currently on amoxicillin. Denies chest pain

## 2016-06-10 ENCOUNTER — Inpatient Hospital Stay: Payer: Medicare Other

## 2016-06-10 ENCOUNTER — Inpatient Hospital Stay: Payer: Medicare Other | Attending: Internal Medicine

## 2016-06-10 VITALS — BP 140/86 | HR 68 | Temp 97.1°F | Resp 20

## 2016-06-10 DIAGNOSIS — D751 Secondary polycythemia: Secondary | ICD-10-CM

## 2016-06-10 LAB — HEMOGLOBIN AND HEMATOCRIT, BLOOD
HCT: 55.1 % — ABNORMAL HIGH (ref 40.0–52.0)
Hemoglobin: 18.8 g/dL — ABNORMAL HIGH (ref 13.0–18.0)

## 2016-07-22 ENCOUNTER — Other Ambulatory Visit: Payer: Self-pay | Admitting: Pain Medicine

## 2016-08-13 ENCOUNTER — Inpatient Hospital Stay: Payer: Medicare Other

## 2016-08-13 ENCOUNTER — Inpatient Hospital Stay: Payer: Medicare Other | Attending: Internal Medicine | Admitting: Internal Medicine

## 2016-08-13 VITALS — BP 131/80 | HR 64 | Temp 97.5°F | Wt 225.0 lb

## 2016-08-13 DIAGNOSIS — B1920 Unspecified viral hepatitis C without hepatic coma: Secondary | ICD-10-CM | POA: Insufficient documentation

## 2016-08-13 DIAGNOSIS — D751 Secondary polycythemia: Secondary | ICD-10-CM

## 2016-08-13 DIAGNOSIS — Z7982 Long term (current) use of aspirin: Secondary | ICD-10-CM | POA: Insufficient documentation

## 2016-08-13 DIAGNOSIS — R490 Dysphonia: Secondary | ICD-10-CM | POA: Diagnosis not present

## 2016-08-13 DIAGNOSIS — M5030 Other cervical disc degeneration, unspecified cervical region: Secondary | ICD-10-CM | POA: Insufficient documentation

## 2016-08-13 DIAGNOSIS — Z86010 Personal history of colonic polyps: Secondary | ICD-10-CM | POA: Insufficient documentation

## 2016-08-13 DIAGNOSIS — K219 Gastro-esophageal reflux disease without esophagitis: Secondary | ICD-10-CM | POA: Diagnosis not present

## 2016-08-13 DIAGNOSIS — M545 Low back pain: Secondary | ICD-10-CM | POA: Diagnosis not present

## 2016-08-13 DIAGNOSIS — N400 Enlarged prostate without lower urinary tract symptoms: Secondary | ICD-10-CM | POA: Insufficient documentation

## 2016-08-13 DIAGNOSIS — I10 Essential (primary) hypertension: Secondary | ICD-10-CM | POA: Diagnosis not present

## 2016-08-13 DIAGNOSIS — K7460 Unspecified cirrhosis of liver: Secondary | ICD-10-CM | POA: Insufficient documentation

## 2016-08-13 DIAGNOSIS — M5136 Other intervertebral disc degeneration, lumbar region: Secondary | ICD-10-CM | POA: Diagnosis not present

## 2016-08-13 DIAGNOSIS — Z85828 Personal history of other malignant neoplasm of skin: Secondary | ICD-10-CM | POA: Diagnosis not present

## 2016-08-13 DIAGNOSIS — G8929 Other chronic pain: Secondary | ICD-10-CM | POA: Diagnosis not present

## 2016-08-13 DIAGNOSIS — Z79899 Other long term (current) drug therapy: Secondary | ICD-10-CM | POA: Diagnosis not present

## 2016-08-13 LAB — HEMOGLOBIN AND HEMATOCRIT, BLOOD
HEMATOCRIT: 46.9 % (ref 40.0–52.0)
Hemoglobin: 16.2 g/dL (ref 13.0–18.0)

## 2016-08-13 NOTE — Progress Notes (Signed)
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 Patient has been getting intermittent phlebotomies in the last many months.  He denies any burning pain in his hand and feet. No history of blood clots and strokes.Chronic back pain not any worse. He does complain of increasing episodes of URI. Complains of nasal stuffiness.  REVIEW OF SYSTEMS:  A complete 10 point review of system is done which is negative except mentioned above/history of present illness.   PAST MEDICAL HISTORY :  Past Medical History:  Diagnosis Date  . Arthritis   . Back pain    MULTIPLE BACK SURGERIES  . Benign essential HTN 12/07/2014   Last Assessment & Plan:  Relevant Hx: Course: Daily Update: Today's Plan: CONTROLLED ON MEDS  . 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/31/2013  . Chronic back pain   . 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 Cirrhosis ([place])   . DDD (degenerative disc disease), cervical 11/06/2014  . DDD (degenerative disc disease), lumbar 11/06/2014  . 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 Erythrocytosis   . Esophagitis, reflux 12/07/2014  . GERD (gastroesophageal reflux disease)   . HCV (hepatitis C virus) 12/07/2014  . Hepatitis C    DID HARVONI TREATMENT/ NO LONGER HEP C POSITIVE  . Idiopathic scoliosis and kyphoscoliosis 03/01/2013  . Neuropathy of both feet   . Painful legs and moving toes    DUE TO BACK PROBLEMS  . Prostatitis   . Sacroiliac joint dysfunction 11/06/2014  . Spinal stenosis 12/07/2014  .  Testicular dysfunction 11/04/2011   Overview:  Dr. Jacqlyn Larsen is his urologist.     PAST SURGICAL HISTORY :   Past Surgical History:  Procedure Laterality Date  . BACK SURGERY  04/14/13   L2-S1  . 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 CATARACT EXTRACTION Left 1989  . CERVICAL SPINE SURGERY      X2 FUSION C4-7  . CHOLECYSTECTOMY  07/2013  . COLONOSCOPY WITH PROPOFOL N/A 12/18/2014   Procedure: COLONOSCOPY WITH PROPOFOL;  Surgeon: Lucilla Lame, MD;  Location: [place];  Service: Endoscopy;  Laterality: N/A;  . ELBOW ARTHROSCOPY Right 02/2012  . ESOPHAGOGASTRODUODENOSCOPY (EGD) WITH PROPOFOL N/A 12/18/2014   Procedure: ESOPHAGOGASTRODUODENOSCOPY (EGD) WITH PROPOFOL with dialtion;  Surgeon: Lucilla Lame, MD;  Location: [place];  Service: Endoscopy;  Laterality: N/A;  . FACIAL RECONSTRUCTION SURGERY  1970   BOTTLE CUT NOSE AND EYE, MULTIPLE SURGERIES  . FINGER SURGERY Left 2015   THUMB  . FINGER SURGERY Right 1966   MIDDLE  . FOOT SURGERY Right 1984  . FUNDIPLICATION  7673  . HERNIA REPAIR Left 1981/1957   RIGHT SIDE IN [phone]  . MASS EXCISION Left 02/16/2015   Procedure: LEFT BURSA ELBOW EXCISION;  Surgeon: Leanor Kail, MD;  Location: [facility];  Service: Orthopedics;  Laterality: Left;  . NISSEN FUNDOPLICATION  7902  . NOSE SURGERY  12/01/02   SEPTUM RECONSTRUCTION  . POLYPECTOMY  12/18/2014   Procedure: POLYPECTOMY;  Surgeon: Lucilla Lame, MD;  Location: [place];  Service: Endoscopy;;  . SEPTOPLASTY  1999  . SHOULDER SURGERY Right 1983  . SHOULDER SURGERY Left 1979   WITH ELBOW (TENDON RELEASE)  . SKIN SURGERY  2004   SKIN CANCER NOSE    FAMILY HISTORY :   Family History  Problem Relation Age of Onset  . Arthritis Mother   . Asthma Mother   . Cancer Mother   . Heart disease Father     SOCIAL HISTORY:   Social History  Substance Use Topics  . Smoking status: Never Smoker  . Smokeless tobacco: Never Used  . Alcohol use 0.6 oz/week    1  Glasses of wine per week    ALLERGIES:  is allergic to no known allergies.  MEDICATIONS:  Current Outpatient Prescriptions  Medication Sig Dispense Refill  . amLODipine (NORVASC) 5 MG tablet Take 1 tablet by mouth daily.    [name] Kitchen aspirin 81 MG tablet Take 81 mg by mouth daily.    . DULoxetine (CYMBALTA) 20 MG capsule Take 30 mg by mouth daily.     . furosemide (LASIX) 40 MG tablet Take 40 mg by mouth as needed. prn    . loratadine (CLARITIN) 10 MG tablet Take 10 mg by mouth daily. AM    . magnesium citrate SOLN Take 15 mLs by mouth daily. Reported on 06/05/2015    . Oxycodone HCl 20 MG TABS Limit one half to one tab by mouth 3-6 times per day if tolerated   NOTE OXYCODONE IS NOW 20 MG 180 tablet 0  . ranitidine (ZANTAC) 75 MG tablet Take 75 mg by mouth daily.    . sildenafil (REVATIO) 20 MG tablet Take 20 mg by mouth as needed.    [name] Kitchen spironolactone (ALDACTONE) 25 MG tablet Take 25 mg by mouth daily. AM    . tamsulosin (FLOMAX) 0.4 MG CAPS capsule Take 0.4 mg by mouth daily. AM    . testosterone cypionate (DEPOTESTOTERONE CYPIONATE) 200 MG/ML injection Inject 100 mg into the muscle every 7 (seven) days.    . naloxone (NARCAN) 0.4 MG/ML injection Inject 0.4 mg into the muscle as needed. Reported on 08/08/2015    . NIACIN PO Supplemental powder-reconstituted     No current facility-administered medications for this visit.     PHYSICAL EXAMINATION:   BP 131/80 (BP Location: Left Arm, Patient Position: Sitting)   Pulse 64   Temp 97.5 F (36.4 C) (Tympanic)   Wt 225 lb (102.1 kg)   BMI 33.23 kg/m    Filed Weights   08/13/16 1419  Weight: 225 lb (102.1 kg)    GENERAL: Well-nourished well-developed; Alert, no distress and comfortable.   Alone.  EYES: no pallor or icterus OROPHARYNX: no thrush or ulceration; good dentition  NECK: supple, no masses felt LYMPH:  no palpable lymphadenopathy in the cervical, axillary or inguinal regions LUNGS: clear to auscultation and  No wheeze or  crackles HEART/CVS: regular rate & rhythm and no murmurs; No lower extremity edema ABDOMEN:abdomen soft, non-tender and normal bowel sounds Musculoskeletal:no cyanosis of digits and no clubbing  PSYCH: alert & oriented x 3 with fluent speech NEURO: no focal motor/sensory deficits SKIN:  no rashes or significant lesions  LABORATORY DATA:  I have reviewed the data as listed    Component Value Date/Time   NA 137 04/18/2016 0842   NA 137 02/22/2012 0645   K 3.4 (L) 04/18/2016 0842   K 4.6 02/22/2012 0645   CL 99 (L) 04/18/2016 0842   CL 103 02/22/2012 0645  CO2 31 04/18/2016 0842   CO2 30 02/22/2012 0645   GLUCOSE 103 (H) 04/18/2016 0842   GLUCOSE 90 02/22/2012 0645   BUN 18 04/18/2016 0842   BUN 9 02/22/2012 0645   CREATININE 0.98 04/18/2016 0842   CREATININE 0.97 02/22/2012 0645   CALCIUM 8.7 (L) 04/18/2016 0842   CALCIUM 8.5 02/22/2012 0645   PROT 7.5 04/18/2016 0842   PROT 6.9 02/22/2012 0645   ALBUMIN 4.0 04/18/2016 0842   ALBUMIN 3.1 (L) 02/22/2012 0645   AST 85 (H) 04/18/2016 0842   AST 80 (H) 02/22/2012 0645   ALT 54 04/18/2016 0842   ALT 96 (H) 02/22/2012 0645   ALKPHOS 58 04/18/2016 0842   ALKPHOS 47 (L) 02/22/2012 0645   BILITOT 0.8 04/18/2016 0842   BILITOT 0.3 02/22/2012 0645   GFRNONAA >60 04/18/2016 0842   GFRNONAA >60 02/22/2012 0645   GFRAA >60 04/18/2016 0842   GFRAA >60 02/22/2012 0645    No results found for: SPEP, UPEP  Lab Results  Component Value Date   WBC 5.5 04/18/2016   NEUTROABS 2.8 04/18/2016   HGB 16.2 08/13/2016   HCT 46.9 08/13/2016   MCV 93.1 04/18/2016   PLT 176 04/18/2016      Chemistry      Component Value Date/Time   NA 137 04/18/2016 0842   NA 137 02/22/2012 0645   K 3.4 (L) 04/18/2016 0842   K 4.6 02/22/2012 0645   CL 99 (L) 04/18/2016 0842   CL 103 02/22/2012 0645   CO2 31 04/18/2016 0842   CO2 30 02/22/2012 0645   BUN 18 04/18/2016 0842   BUN 9 02/22/2012 0645   CREATININE 0.98 04/18/2016 0842   CREATININE  0.97 02/22/2012 0645      Component Value Date/Time   CALCIUM 8.7 (L) 04/18/2016 0842   CALCIUM 8.5 02/22/2012 0645   ALKPHOS 58 04/18/2016 0842   ALKPHOS 47 (L) 02/22/2012 0645   AST 85 (H) 04/18/2016 0842   AST 80 (H) 02/22/2012 0645   ALT 54 04/18/2016 0842   ALT 96 (H) 02/22/2012 0645   BILITOT 0.8 04/18/2016 0842   BILITOT 0.3 02/22/2012 0645        ASSESSMENT & PLAN:   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 Recommend phlebotomy q 3 months if HCT > 50. Patient denies any improvement of his symptoms post phlebotomy.  # Hoarseness of voice s/p Dr.McQueen eval. Recommend follow up in   # ? Polyp in [place]/EGD [nov 2016]  # Back pain- chronic  # recheck  H&H every 3 months/phlebotomy; follow up with me in 6 months.      Cammie Sickle, MD 08/13/2016 3:04 PM

## 2016-08-13 NOTE — Assessment & Plan Note (Addendum)
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 Recommend phlebotomy q 3 months if HCT > 50. Patient denies any improvement of his symptoms post phlebotomy.  # Hoarseness of voice s/p Dr.McQueen eval. Recommend follow up in   # ? Polyp in [place]/EGD [nov 2016]  # Back pain- chronic  # recheck  H&H every 3 months/phlebotomy; follow up with me in 6 months.

## 2016-09-30 DIAGNOSIS — N201 Calculus of ureter: Secondary | ICD-10-CM | POA: Insufficient documentation

## 2016-09-30 DIAGNOSIS — N132 Hydronephrosis with renal and ureteral calculous obstruction: Secondary | ICD-10-CM | POA: Insufficient documentation

## 2016-09-30 DIAGNOSIS — N23 Unspecified renal colic: Secondary | ICD-10-CM | POA: Insufficient documentation

## 2016-10-09 DIAGNOSIS — C4491 Basal cell carcinoma of skin, unspecified: Secondary | ICD-10-CM

## 2016-10-09 HISTORY — DX: Basal cell carcinoma of skin, unspecified: C44.91

## 2016-10-29 DIAGNOSIS — N3001 Acute cystitis with hematuria: Secondary | ICD-10-CM | POA: Insufficient documentation

## 2016-11-12 ENCOUNTER — Inpatient Hospital Stay: Payer: Medicare Other

## 2016-11-12 ENCOUNTER — Inpatient Hospital Stay: Payer: Medicare Other | Attending: Internal Medicine

## 2016-11-12 VITALS — BP 130/87 | HR 94 | Temp 97.0°F | Resp 20

## 2016-11-12 DIAGNOSIS — Z86010 Personal history of colonic polyps: Secondary | ICD-10-CM | POA: Diagnosis not present

## 2016-11-12 DIAGNOSIS — M5136 Other intervertebral disc degeneration, lumbar region: Secondary | ICD-10-CM | POA: Diagnosis not present

## 2016-11-12 DIAGNOSIS — D751 Secondary polycythemia: Secondary | ICD-10-CM

## 2016-11-12 DIAGNOSIS — Z79899 Other long term (current) drug therapy: Secondary | ICD-10-CM | POA: Insufficient documentation

## 2016-11-12 DIAGNOSIS — Z7982 Long term (current) use of aspirin: Secondary | ICD-10-CM | POA: Insufficient documentation

## 2016-11-12 DIAGNOSIS — N400 Enlarged prostate without lower urinary tract symptoms: Secondary | ICD-10-CM | POA: Diagnosis not present

## 2016-11-12 DIAGNOSIS — Z85828 Personal history of other malignant neoplasm of skin: Secondary | ICD-10-CM | POA: Insufficient documentation

## 2016-11-12 DIAGNOSIS — M5030 Other cervical disc degeneration, unspecified cervical region: Secondary | ICD-10-CM | POA: Diagnosis not present

## 2016-11-12 DIAGNOSIS — I10 Essential (primary) hypertension: Secondary | ICD-10-CM | POA: Diagnosis not present

## 2016-11-12 DIAGNOSIS — B1920 Unspecified viral hepatitis C without hepatic coma: Secondary | ICD-10-CM | POA: Insufficient documentation

## 2016-11-12 DIAGNOSIS — K219 Gastro-esophageal reflux disease without esophagitis: Secondary | ICD-10-CM | POA: Insufficient documentation

## 2016-11-12 DIAGNOSIS — K7460 Unspecified cirrhosis of liver: Secondary | ICD-10-CM | POA: Diagnosis not present

## 2016-11-12 LAB — HEMOGLOBIN: HEMOGLOBIN: 18.8 g/dL — AB (ref 13.0–18.0)

## 2016-11-12 LAB — HEMATOCRIT: HEMATOCRIT: 53.5 % — AB (ref 40.0–52.0)

## 2016-12-24 IMAGING — MR MR LUMBAR SPINE WO/W CM
7 series · 38 of 48 positions shown · IV contrast (multihance)
Comparison: MRI lumbar spine 08/16/2012 and 07/06/2011.

CLINICAL DATA: Chronic low back pain, weakness and numbness which
has worsened over the past month. History of prior lumbar surgery.
No known injury.

BUN and creatinine were obtained on site at [HOSPITAL] at
[HOSPITAL].
Results:  BUN 15 mg/dL,  Creatinine 1.0 mg/dL.
EXAM:
MRI LUMBAR SPINE WITHOUT AND WITH CONTRAST
TECHNIQUE: Multiplanar and multiecho pulse sequences of the lumbar spine were
obtained without and with intravenous contrast.
CONTRAST:  19 mL MULTIHANCE GADOBENATE DIMEGLUMINE 529 MG/ML IV SOLN

[Series 3: T2 · sagittal · 4.0mm · 0.88mm/px · 3 of 15 slices shown (1 of 2)]
[im 1/15]
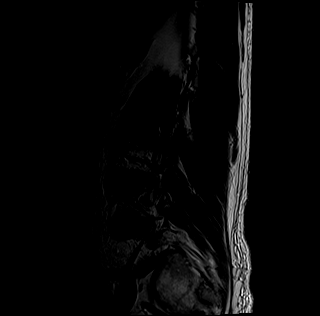
[im 8/15]
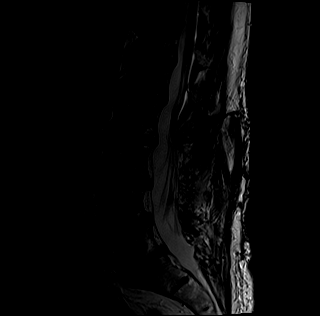
[im 15/15]
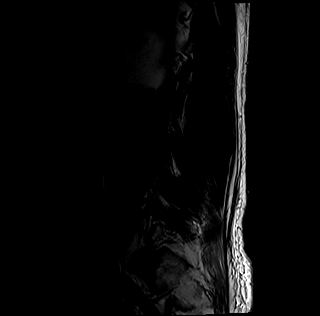

[Series 4: STIR · sagittal · 4.0mm · 0.55mm/px · 4 of 15 slices shown]
[im 1/15]
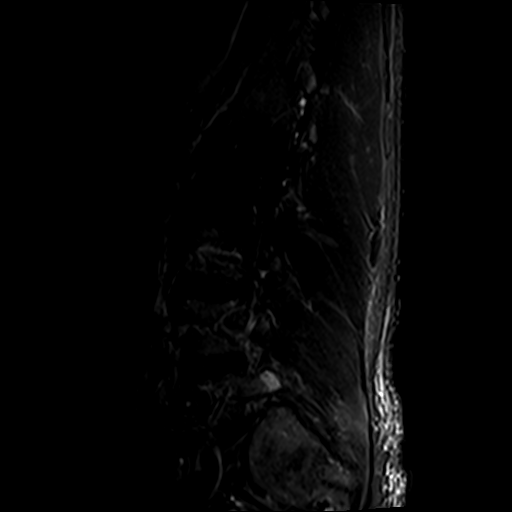
[im 5/15]
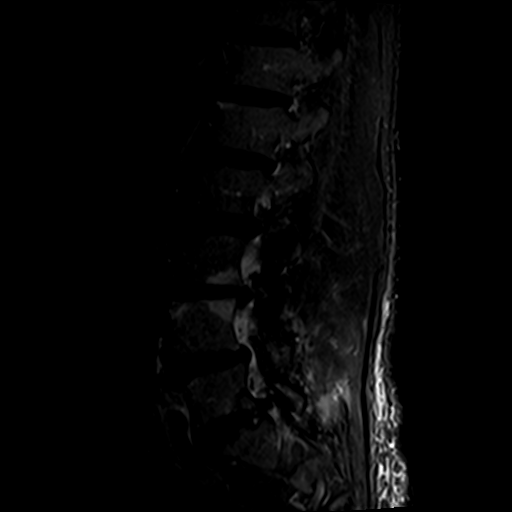
[im 10/15]
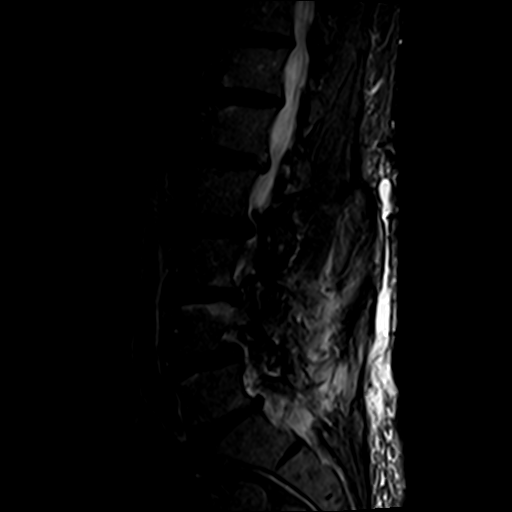
[im 15/15]
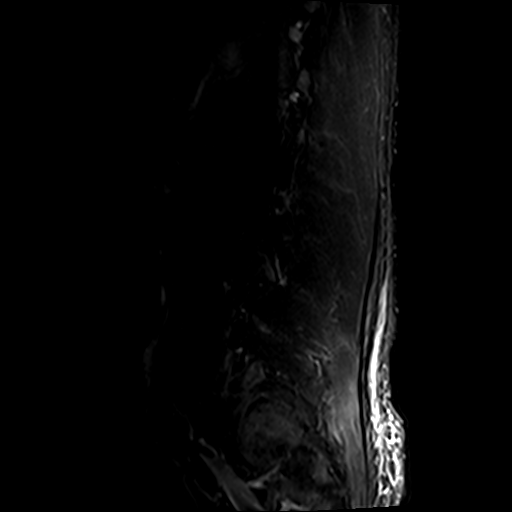

[Series 5: T1 · sagittal · 4.0mm · 0.88mm/px · 4 of 15 slices shown (1 of 2)]
[im 1/15]
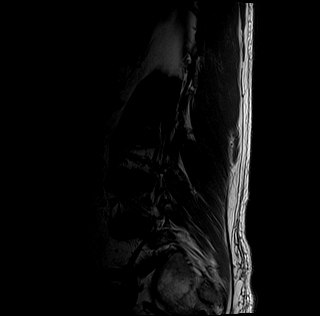
[im 5/15]
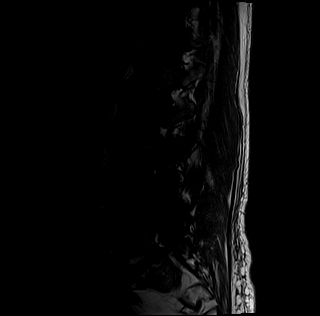
[im 10/15]
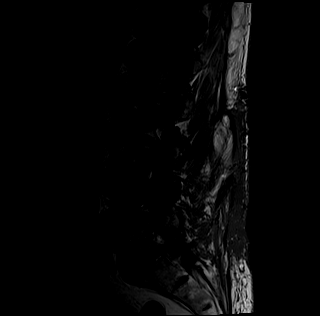
[im 15/15]
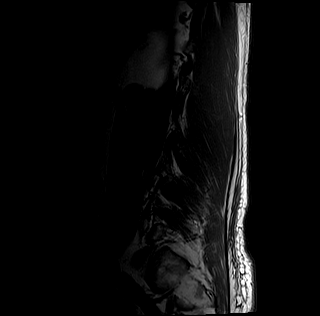

[Series 7: T1 · axial · 4.0mm · 0.47mm/px · z∈[-171,+48]mm · 11 of 44 slices shown (2 of 2)]
[im 1/44]
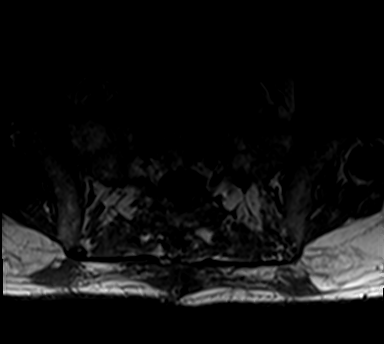
[im 5/44]
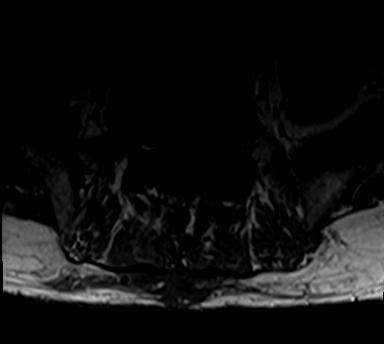
[im 9/44]
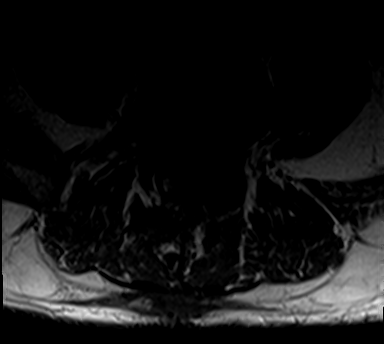
[im 13/44]
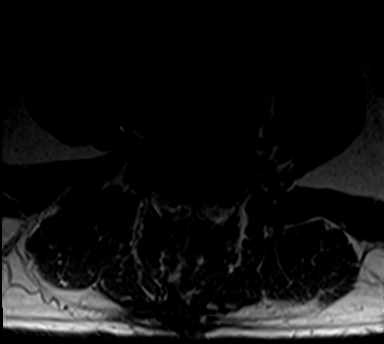
[im 18/44]
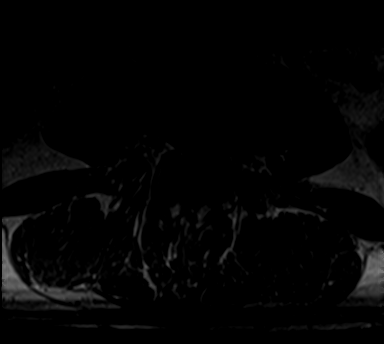
[im 22/44]
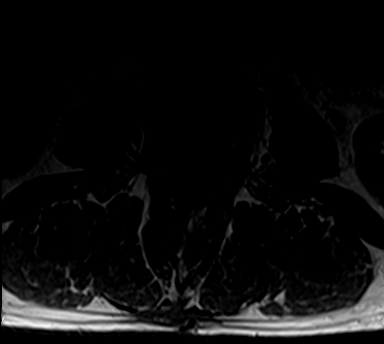
[im 26/44]
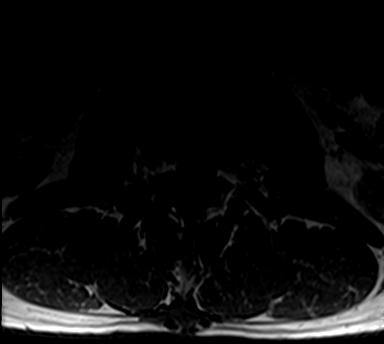
[im 31/44]
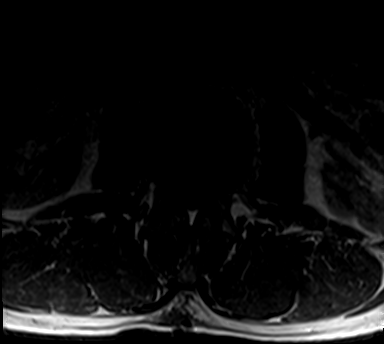
[im 35/44]
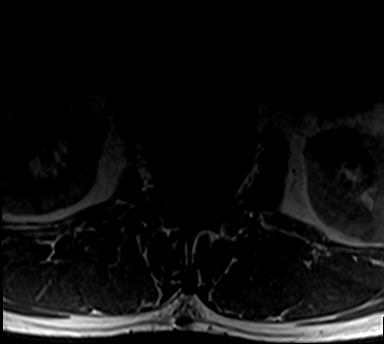
[im 39/44]
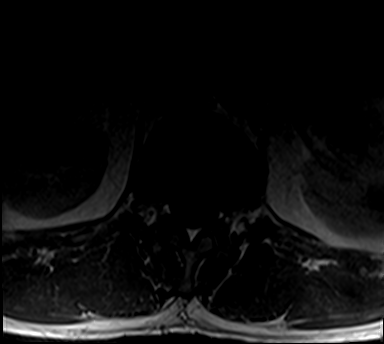
[im 44/44]
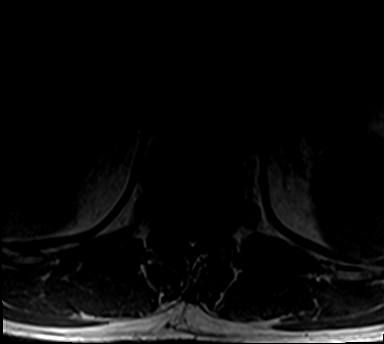

[Series 8: T2 · axial · 4.0mm · 0.70mm/px · z∈[-171,+48]mm · 11 of 44 slices shown (2 of 2)]
[im 1/44]
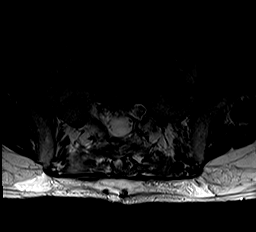
[im 5/44]
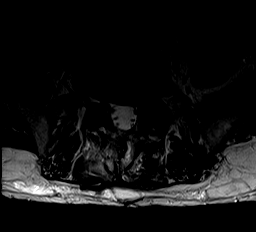
[im 9/44]
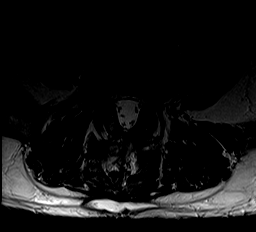
[im 13/44]
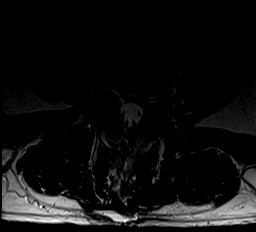
[im 18/44]
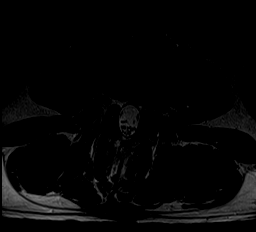
[im 22/44]
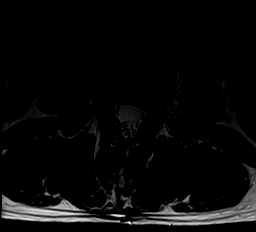
[im 26/44]
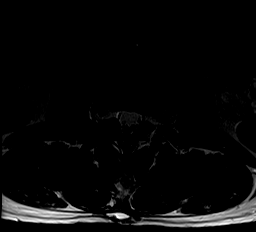
[im 31/44]
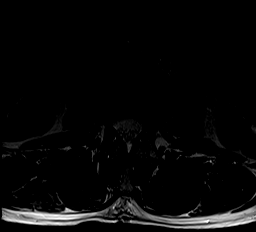
[im 35/44]
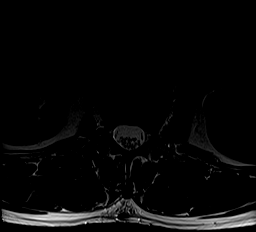
[im 39/44]
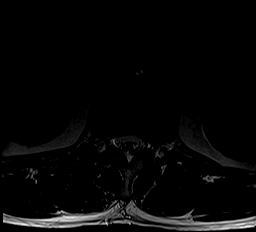
[im 44/44]
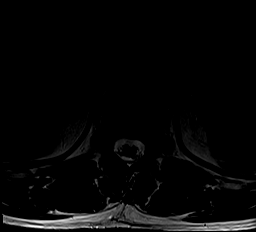

[Series 9: T1 post-contrast · axial · 4.0mm · 0.47mm/px · 1 of 44 slices shown]
[im 1/44]
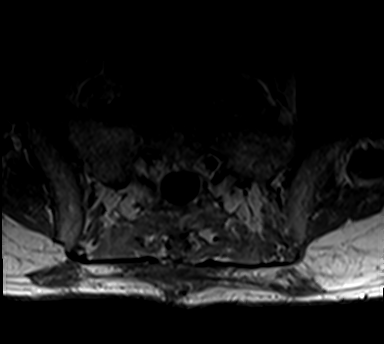

[Series 10: T1 fat-sat post-contrast · sagittal · 4.0mm · 0.88mm/px · 4 of 15 slices shown]
[im 1/15]
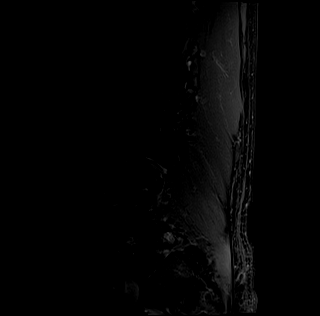
[im 5/15]
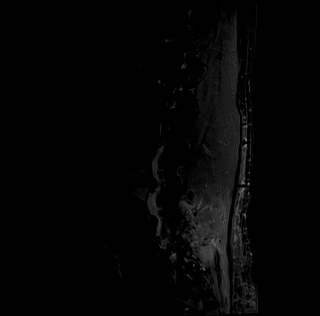
[im 10/15]
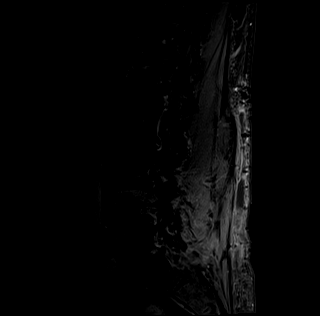
[im 15/15]
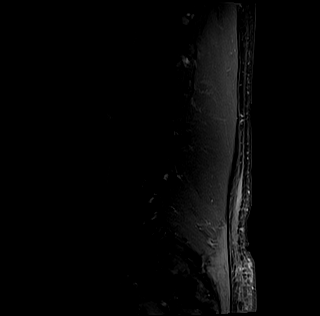

[38 of 48 positions shown; findings below may reference images not displayed]

FINDINGS: Vertebral body height is maintained. 0.2 cm anterolisthesis L4 on L5
is due to facet arthropathy. There is trace retrolisthesis L1 on L2
and L2 on L3. Scattered degenerative endplate signal change is
identified and most conspicuous posteriorly at L3-4 where there is
associated postcontrast enhancement. There is infiltration of
ventral epidural fat with postcontrast enhancement throughout the
lumbar spine and most conspicuous posterior to the L4 and L5
vertebral bodies. No fluid collection is identified. The conus
medullaris is normal in signal and position. Imaged intra-abdominal
contents are unremarkable.

The T11-12 level is imaged in the sagittal plane only. There is a
shallow disc bulge but the central canal and foramina appear open.

T12-L1:  Negative.

L1-2: Shallow disc bulge without central canal or foraminal
narrowing.

L2-3: The patient is status post posterior decompression. There is a
shallow disc bulge and endplate spur eccentric to the left. The
thecal sac is widely decompressed. The foramina are patent.

L3-4: The patient is status post posterior decompression. There is a
shallow disc bulge but the central canal is widely patent. The
foramina are open.

L4-5: The patient is status post posterior decompression. There is
marrow edema and enhancement in the left pedicles. A milder degree
of edema and enhancement are seen in the right L4 pedicle. There is
a broad-based disc bulge but the central canal is widely patent.
Mild to moderate foraminal narrowing is more notable on the right.

L5-S1: Status post posterior decompression. There is a disc bulge
eccentric to the left. The central canal is widely patent. Mild to
moderate bilateral foraminal narrowing is seen.
IMPRESSION: Ventral epidural enhancement without fluid collection is likely
secondary to epidural fibrosis/granulation tissue.

Status post L2-3 to L5-S1 posterior decompression since the most
recent MRI. The central canal is widely patent at each level.

Facet degenerative change most notable at L4-5 where there is marrow
edema and enhancement in the left pedicles and in the right L4
pedicle consistent with stress change.

Mild to moderate bilateral foraminal narrowing L4-5, greater on the
right.

Mild to moderate bilateral foraminal narrowing L5-S1.

## 2017-02-11 ENCOUNTER — Inpatient Hospital Stay: Payer: Medicare Other

## 2017-02-11 ENCOUNTER — Inpatient Hospital Stay: Payer: Medicare Other | Attending: Internal Medicine | Admitting: Internal Medicine

## 2017-02-11 VITALS — BP 141/90 | HR 64 | Temp 97.8°F | Resp 20 | Ht 69.0 in | Wt 217.0 lb

## 2017-02-11 DIAGNOSIS — Z7982 Long term (current) use of aspirin: Secondary | ICD-10-CM | POA: Diagnosis not present

## 2017-02-11 DIAGNOSIS — D751 Secondary polycythemia: Secondary | ICD-10-CM

## 2017-02-11 DIAGNOSIS — Z79899 Other long term (current) drug therapy: Secondary | ICD-10-CM | POA: Diagnosis not present

## 2017-02-11 DIAGNOSIS — K7460 Unspecified cirrhosis of liver: Secondary | ICD-10-CM | POA: Insufficient documentation

## 2017-02-11 DIAGNOSIS — Z85828 Personal history of other malignant neoplasm of skin: Secondary | ICD-10-CM | POA: Diagnosis not present

## 2017-02-11 DIAGNOSIS — B1920 Unspecified viral hepatitis C without hepatic coma: Secondary | ICD-10-CM | POA: Diagnosis not present

## 2017-02-11 DIAGNOSIS — M5030 Other cervical disc degeneration, unspecified cervical region: Secondary | ICD-10-CM | POA: Diagnosis not present

## 2017-02-11 DIAGNOSIS — I10 Essential (primary) hypertension: Secondary | ICD-10-CM | POA: Diagnosis not present

## 2017-02-11 DIAGNOSIS — K219 Gastro-esophageal reflux disease without esophagitis: Secondary | ICD-10-CM | POA: Insufficient documentation

## 2017-02-11 DIAGNOSIS — M5136 Other intervertebral disc degeneration, lumbar region: Secondary | ICD-10-CM | POA: Diagnosis not present

## 2017-02-11 DIAGNOSIS — N400 Enlarged prostate without lower urinary tract symptoms: Secondary | ICD-10-CM | POA: Insufficient documentation

## 2017-02-11 LAB — HEMATOCRIT: HEMATOCRIT: 48 % (ref 40.0–52.0)

## 2017-02-11 LAB — HEMOGLOBIN: HEMOGLOBIN: 16.7 g/dL (ref 13.0–18.0)

## 2017-02-11 NOTE — Progress Notes (Signed)
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   # chronic back pain.   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   Patient last phlebotomy was approximately 3 months ago when his hematocrit was 53.  He denies any burning pain in his hand and feet. No history of blood clots and strokes.Chronic back pain not any worse. [name] Kitchen  REVIEW OF SYSTEMS:  A complete 10 point review of system is done which is negative except mentioned above/history of present illness.   PAST MEDICAL HISTORY :  Past Medical History:  Diagnosis Date  . Arthritis   . Back pain    MULTIPLE BACK SURGERIES  . Benign essential HTN 12/07/2014   Last Assessment & Plan:  Relevant Hx: Course: Daily Update: Today's Plan: CONTROLLED ON MEDS  . 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/31/2013  . Chronic back pain   . 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 Cirrhosis ([place])   . DDD (degenerative disc disease), cervical 11/06/2014  . DDD (degenerative disc disease), lumbar 11/06/2014  . 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 Erythrocytosis   . Esophagitis, reflux 12/07/2014  . GERD (gastroesophageal reflux disease)   . HCV (hepatitis C virus) 12/07/2014  . Hepatitis C    DID HARVONI TREATMENT/ NO LONGER HEP C POSITIVE  . Idiopathic scoliosis and kyphoscoliosis 03/01/2013  . Neuropathy of both feet   . Painful legs and moving toes    DUE TO BACK PROBLEMS  . Prostatitis   . Sacroiliac joint dysfunction 11/06/2014  . Spinal stenosis 12/07/2014  . Testicular  dysfunction 11/04/2011   Overview:  Dr. Jacqlyn Larsen is his urologist.     PAST SURGICAL HISTORY :   Past Surgical History:  Procedure Laterality Date  . BACK SURGERY  04/14/13   L2-S1  . 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 CATARACT EXTRACTION Left 1989  . CERVICAL SPINE SURGERY      X2 FUSION C4-7  . CHOLECYSTECTOMY  07/2013  . COLONOSCOPY WITH PROPOFOL N/A 12/18/2014   Procedure: COLONOSCOPY WITH PROPOFOL;  Surgeon: Lucilla Lame, MD;  Location: [facility];  Service: Endoscopy;  Laterality: N/A;  . ELBOW ARTHROSCOPY Right 02/2012  . ESOPHAGOGASTRODUODENOSCOPY (EGD) WITH PROPOFOL N/A 12/18/2014   Procedure: ESOPHAGOGASTRODUODENOSCOPY (EGD) WITH PROPOFOL with dialtion;  Surgeon: Lucilla Lame, MD;  Location: [facility];  Service: Endoscopy;  Laterality: N/A;  . FACIAL RECONSTRUCTION SURGERY  1970   BOTTLE CUT NOSE AND EYE, MULTIPLE SURGERIES  . FINGER SURGERY Left 2015   THUMB  . FINGER SURGERY Right 1966   MIDDLE  . FOOT SURGERY Right 1984  . FUNDIPLICATION  0175  . HERNIA REPAIR Left 1981/1957   RIGHT SIDE IN [phone]  . MASS EXCISION Left 02/16/2015   Procedure: LEFT BURSA ELBOW EXCISION;  Surgeon: Leanor Kail, MD;  Location: [facility];  Service: Orthopedics;  Laterality: Left;  . NISSEN FUNDOPLICATION  8527  . NOSE SURGERY  12/01/02   SEPTUM RECONSTRUCTION  . POLYPECTOMY  12/18/2014   Procedure: POLYPECTOMY;  Surgeon: Lucilla Lame, MD;  Location: [facility];  Service: Endoscopy;;  . SEPTOPLASTY  1999  . SHOULDER SURGERY Right 1983  . SHOULDER SURGERY Left 1979   WITH ELBOW (TENDON RELEASE)  . SKIN SURGERY  2004   SKIN CANCER NOSE    FAMILY HISTORY :   Family History  Problem Relation Age of Onset  . Arthritis Mother   . Asthma Mother   . Cancer Mother   . Heart disease Father     SOCIAL HISTORY:   Social History  Substance Use Topics  . Smoking status: Never Smoker  . Smokeless tobacco: Never Used  . Alcohol use 0.6 oz/week    1 Glasses of  wine per week    ALLERGIES:  is allergic to no known allergies.  MEDICATIONS:  Current Outpatient Prescriptions  Medication Sig Dispense Refill  . amLODipine (NORVASC) 5 MG tablet Take 1 tablet by mouth daily.    [name] Kitchen aspirin 81 MG tablet Take 81 mg by mouth daily.    . clonazePAM (KLONOPIN) 0.5 MG tablet Take 1 tablet by mouth 2 (two) times daily as needed for anxiety.    . DULoxetine (CYMBALTA) 20 MG capsule Take 30 mg by mouth daily.     . furosemide (LASIX) 40 MG tablet Take 40 mg by mouth as needed. prn    . loratadine (CLARITIN) 10 MG tablet Take 10 mg by mouth daily. AM    . magnesium citrate SOLN Take 15 mLs by mouth daily. Reported on 06/05/2015    . meloxicam (MOBIC) 7.5 MG tablet Take 1 tablet by mouth 2 (two) times daily.    . ranitidine (ZANTAC) 75 MG tablet Take 75 mg by mouth daily.    [name] Kitchen spironolactone (ALDACTONE) 25 MG tablet Take 25 mg by mouth daily. AM    . tamsulosin (FLOMAX) 0.4 MG CAPS capsule Take 0.4 mg by mouth daily. AM    . testosterone cypionate (DEPOTESTOTERONE CYPIONATE) 200 MG/ML injection Inject 100 mg into the muscle every 7 (seven) days.    . naloxone (NARCAN) 0.4 MG/ML injection Inject 0.4 mg into the muscle as needed. Reported on 08/08/2015    . NIACIN PO Supplemental powder-reconstituted     No current facility-administered medications for this visit.     PHYSICAL EXAMINATION:   BP (!) 141/90 (Patient Position: Sitting)   Pulse 64   Temp 97.8 F (36.6 C) (Tympanic)   Resp 20   Ht 5\' 9"  (1.753 m)   Wt 217 lb (98.4 kg)   BMI 32.05 kg/m   Filed Weights   02/11/17 1336  Weight: 217 lb (98.4 kg)    GENERAL: Well-nourished well-developed; Alert, no distress and comfortable.   Alone.  EYES: no pallor or icterus OROPHARYNX: no thrush or ulceration; good dentition  NECK: supple, no masses felt LYMPH:  no palpable lymphadenopathy in the cervical, axillary or inguinal regions LUNGS: clear to auscultation and  No wheeze or crackles HEART/CVS:  regular rate & rhythm and no murmurs; No lower extremity edema ABDOMEN:abdomen soft, non-tender and normal bowel sounds Musculoskeletal:no cyanosis of digits and no clubbing  PSYCH: alert & oriented x 3 with fluent speech NEURO: no focal motor/sensory deficits SKIN:  no rashes or significant lesions  LABORATORY DATA:  I have reviewed the data as listed    Component Value Date/Time   NA 137 04/18/2016 0842   NA 137 02/22/2012 0645   K 3.4 (L) 04/18/2016 0842   K 4.6 02/22/2012 0645   CL 99 (L) 04/18/2016 0842   CL  103 02/22/2012 0645   CO2 31 04/18/2016 0842   CO2 30 02/22/2012 0645   GLUCOSE 103 (H) 04/18/2016 0842   GLUCOSE 90 02/22/2012 0645   BUN 18 04/18/2016 0842   BUN 9 02/22/2012 0645   CREATININE 0.98 04/18/2016 0842   CREATININE 0.97 02/22/2012 0645   CALCIUM 8.7 (L) 04/18/2016 0842   CALCIUM 8.5 02/22/2012 0645   PROT 7.5 04/18/2016 0842   PROT 6.9 02/22/2012 0645   ALBUMIN 4.0 04/18/2016 0842   ALBUMIN 3.1 (L) 02/22/2012 0645   AST 85 (H) 04/18/2016 0842   AST 80 (H) 02/22/2012 0645   ALT 54 04/18/2016 0842   ALT 96 (H) 02/22/2012 0645   ALKPHOS 58 04/18/2016 0842   ALKPHOS 47 (L) 02/22/2012 0645   BILITOT 0.8 04/18/2016 0842   BILITOT 0.3 02/22/2012 0645   GFRNONAA >60 04/18/2016 0842   GFRNONAA >60 02/22/2012 0645   GFRAA >60 04/18/2016 0842   GFRAA >60 02/22/2012 0645    No results found for: SPEP, UPEP  Lab Results  Component Value Date   WBC 5.5 04/18/2016   NEUTROABS 2.8 04/18/2016   HGB 16.7 02/11/2017   HCT 48.0 02/11/2017   MCV 93.1 04/18/2016   PLT 176 04/18/2016      Chemistry      Component Value Date/Time   NA 137 04/18/2016 0842   NA 137 02/22/2012 0645   K 3.4 (L) 04/18/2016 0842   K 4.6 02/22/2012 0645   CL 99 (L) 04/18/2016 0842   CL 103 02/22/2012 0645   CO2 31 04/18/2016 0842   CO2 30 02/22/2012 0645   BUN 18 04/18/2016 0842   BUN 9 02/22/2012 0645   CREATININE 0.98 04/18/2016 0842   CREATININE 0.97 02/22/2012 0645       Component Value Date/Time   CALCIUM 8.7 (L) 04/18/2016 0842   CALCIUM 8.5 02/22/2012 0645   ALKPHOS 58 04/18/2016 0842   ALKPHOS 47 (L) 02/22/2012 0645   AST 85 (H) 04/18/2016 0842   AST 80 (H) 02/22/2012 0645   ALT 54 04/18/2016 0842   ALT 96 (H) 02/22/2012 0645   BILITOT 0.8 04/18/2016 0842   BILITOT 0.3 02/22/2012 0645        ASSESSMENT & PLAN:   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 Recommend phlebotomy q 3 months if HCT > 50. Patient denies any improvement of his symptoms post phlebotomy.  # Hoarseness of voice s/p Dr.McQueen eval. Resolved.   # Back pain- chronic-STABLE.   # recheck  H&H every 3 months/phlebotomy; follow up with me in 6 months/MD.      Cammie Sickle, MD 02/11/2017 1:51 PM

## 2017-02-11 NOTE — Assessment & Plan Note (Addendum)
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 Recommend phlebotomy q 3 months if HCT > 50. Patient denies any improvement of his symptoms post phlebotomy.  # Hoarseness of voice s/p Dr.McQueen eval. Resolved.   # Back pain- chronic-STABLE.   # recheck  H&H every 3 months/phlebotomy; follow up with me in 6 months/MD.

## 2017-05-13 ENCOUNTER — Inpatient Hospital Stay: Payer: Medicare Other

## 2017-05-13 ENCOUNTER — Inpatient Hospital Stay: Payer: Medicare Other | Attending: Internal Medicine

## 2017-05-13 DIAGNOSIS — Z7982 Long term (current) use of aspirin: Secondary | ICD-10-CM | POA: Diagnosis not present

## 2017-05-13 DIAGNOSIS — Z79899 Other long term (current) drug therapy: Secondary | ICD-10-CM | POA: Diagnosis not present

## 2017-05-13 DIAGNOSIS — B1920 Unspecified viral hepatitis C without hepatic coma: Secondary | ICD-10-CM | POA: Insufficient documentation

## 2017-05-13 DIAGNOSIS — M5136 Other intervertebral disc degeneration, lumbar region: Secondary | ICD-10-CM | POA: Diagnosis not present

## 2017-05-13 DIAGNOSIS — K219 Gastro-esophageal reflux disease without esophagitis: Secondary | ICD-10-CM | POA: Insufficient documentation

## 2017-05-13 DIAGNOSIS — M5030 Other cervical disc degeneration, unspecified cervical region: Secondary | ICD-10-CM | POA: Diagnosis not present

## 2017-05-13 DIAGNOSIS — K7460 Unspecified cirrhosis of liver: Secondary | ICD-10-CM | POA: Insufficient documentation

## 2017-05-13 DIAGNOSIS — D751 Secondary polycythemia: Secondary | ICD-10-CM | POA: Diagnosis not present

## 2017-05-13 DIAGNOSIS — N400 Enlarged prostate without lower urinary tract symptoms: Secondary | ICD-10-CM | POA: Diagnosis not present

## 2017-05-13 DIAGNOSIS — Z85828 Personal history of other malignant neoplasm of skin: Secondary | ICD-10-CM | POA: Diagnosis not present

## 2017-05-13 DIAGNOSIS — I10 Essential (primary) hypertension: Secondary | ICD-10-CM | POA: Insufficient documentation

## 2017-05-13 LAB — HEMOGLOBIN: HEMOGLOBIN: 17 g/dL (ref 13.0–18.0)

## 2017-05-13 LAB — HEMATOCRIT: HEMATOCRIT: 49.1 % (ref 40.0–52.0)

## 2017-05-13 NOTE — Progress Notes (Signed)
Pt informed of Hct, no phlebotomy needed today. Per pt he is feeling well, no concerns or complaints.

## 2017-06-10 DIAGNOSIS — N200 Calculus of kidney: Secondary | ICD-10-CM | POA: Insufficient documentation

## 2017-07-17 DIAGNOSIS — E669 Obesity, unspecified: Secondary | ICD-10-CM | POA: Insufficient documentation

## 2017-07-26 ENCOUNTER — Ambulatory Visit
Admission: EM | Admit: 2017-07-26 | Discharge: 2017-07-26 | Disposition: A | Discharge status: Home / Self Care | Payer: Medicare Other | Attending: Family Medicine | Admitting: Family Medicine

## 2017-07-26 ENCOUNTER — Other Ambulatory Visit: Payer: Self-pay

## 2017-07-26 ENCOUNTER — Encounter: Payer: Self-pay | Admitting: *Deleted

## 2017-07-26 DIAGNOSIS — J40 Bronchitis, not specified as acute or chronic: Secondary | ICD-10-CM

## 2017-07-26 DIAGNOSIS — J0100 Acute maxillary sinusitis, unspecified: Secondary | ICD-10-CM

## 2017-07-26 MED ORDER — HYDROCOD POLST-CPM POLST ER 10-8 MG/5ML PO SUER: 5.0000 mL | Freq: Every evening | ORAL | 0 refills | Status: DC | PRN

## 2017-07-26 MED ORDER — DOXYCYCLINE HYCLATE 100 MG PO CAPS: 100.0000 mg | ORAL_CAPSULE | Freq: Two times a day (BID) | ORAL | 0 refills | Status: AC

## 2017-07-26 MED ORDER — PREDNISONE 20 MG PO TABS: 40.0000 mg | ORAL_TABLET | Freq: Every day | ORAL | 0 refills | Status: DC

## 2017-07-26 NOTE — ED Provider Notes (Signed)
[facility]    CSN: [id number] Arrival date & time: 07/26/17  1111     History   Chief Complaint Chief Complaint  Patient presents with  . Nasal Congestion  . Cough    HPI [name] is a 67 y.o. male presents to the urgent care facility for evaluation of maxillary sinus pain and pressure times 1 month with a lot of sinus drainage.  Patient has had mild cough that increased this Wednesday.  He denies any fevers.  Cough is dry nonproductive.  He has been taking cough medicine with mild improvement.  He denies any chest pain, shortness of breath.  No wheezing.  No fevers.  HPI  Past Medical History:  Diagnosis Date  . Arthritis   . Back pain    MULTIPLE BACK SURGERIES  . Benign essential HTN 12/07/2014   Last Assessment & Plan:  Relevant Hx: Course: Daily Update: Today's Plan: CONTROLLED ON MEDS  . 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/31/2013  . Chronic back pain   . 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 Cirrhosis ([place])   . DDD (degenerative disc disease), cervical 11/06/2014  . DDD (degenerative disc disease), lumbar 11/06/2014  . 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   Overview:  severe in feet   . 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 Erythrocytosis   . Esophagitis, reflux 12/07/2014  . GERD (gastroesophageal reflux disease)   . HCV (hepatitis C virus) 12/07/2014  . Hepatitis C    DID HARVONI TREATMENT/ NO LONGER HEP C POSITIVE  . Idiopathic scoliosis and kyphoscoliosis 03/01/2013  . Neuropathy of both feet   . Painful legs and moving toes    DUE TO BACK PROBLEMS  . Prostatitis   . Sacroiliac joint dysfunction 11/06/2014  . Spinal stenosis 12/07/2014  . Testicular dysfunction 11/04/2011   Overview:  Dr. Jacqlyn Larsen is his urologist.     Patient Active Problem List   Diagnosis Date Noted  . Difficulty in swallowing   . Gastritis   . Ectopic gastric tissue   . Hx of colonic  polyps   . Benign neoplasm of cecum   . First degree hemorrhoids   . Esophagitis, reflux 12/07/2014  . Can't get food down 12/07/2014  . Benign essential HTN 12/07/2014  . HCV (hepatitis C virus) 12/07/2014  . Acid reflux 12/07/2014  . Apnea, sleep 12/07/2014  . Spinal stenosis 12/07/2014  . 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 DDD (degenerative disc disease), lumbar 11/06/2014  . DDD (degenerative disc disease), cervical 11/06/2014  . Status post cervical spinal fusion 11/06/2014  . Status post lumbar laminectomy 11/06/2014  . Sacroiliac joint dysfunction 11/06/2014  . 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  . 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/31/2013  . Idiopathic scoliosis and kyphoscoliosis 03/01/2013  . Testicular dysfunction 11/04/2011    Past Surgical History:  Procedure Laterality Date  . BACK SURGERY  04/14/13   L2-S1  . 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 CATARACT EXTRACTION Left 1989  . CERVICAL SPINE SURGERY      X2 FUSION C4-7  . CHOLECYSTECTOMY  07/2013  . COLONOSCOPY WITH PROPOFOL N/A 12/18/2014   Procedure: COLONOSCOPY WITH PROPOFOL;  Surgeon: Lucilla Lame, MD;  Location: [place];  Service: Endoscopy;  Laterality: N/A;  . ELBOW ARTHROSCOPY Right 02/2012  . ESOPHAGOGASTRODUODENOSCOPY (EGD) WITH PROPOFOL N/A  12/18/2014   Procedure: ESOPHAGOGASTRODUODENOSCOPY (EGD) WITH PROPOFOL with dialtion;  Surgeon: Lucilla Lame, MD;  Location: [place];  Service: Endoscopy;  Laterality: N/A;  . FACIAL RECONSTRUCTION SURGERY  1970   BOTTLE CUT NOSE AND EYE, MULTIPLE SURGERIES  . FINGER SURGERY Left 2015   THUMB  . FINGER SURGERY Right 1966   MIDDLE  . FOOT SURGERY Right 1984  . FUNDIPLICATION  5188  . HERNIA REPAIR Left 1981/1957   RIGHT SIDE IN [phone]  . MASS EXCISION Left 02/16/2015   Procedure: LEFT BURSA ELBOW EXCISION;  Surgeon:  Leanor Kail, MD;  Location: [place];  Service: Orthopedics;  Laterality: Left;  . NISSEN FUNDOPLICATION  0630  . NOSE SURGERY  12/01/02   SEPTUM RECONSTRUCTION  . POLYPECTOMY  12/18/2014   Procedure: POLYPECTOMY;  Surgeon: Lucilla Lame, MD;  Location: [place];  Service: Endoscopy;;  . SEPTOPLASTY  1999  . SHOULDER SURGERY Right 1983  . SHOULDER SURGERY Left 1979   WITH ELBOW (TENDON RELEASE)  . SKIN SURGERY  2004   SKIN CANCER NOSE       Home Medications    Prior to Admission medications   Medication Sig Start Date End Date Taking? Authorizing Provider  amLODipine (NORVASC) 10 MG tablet Take 10 mg by mouth daily.   Yes [provider]  aspirin 81 MG tablet Take 81 mg by mouth daily.   Yes [provider]  citalopram (CELEXA) 20 MG tablet Take 20 mg by mouth daily.   Yes [provider]  clonazePAM (KLONOPIN) 0.5 MG tablet Take 1 tablet by mouth 2 (two) times daily as needed for anxiety. 12/18/16  Yes [provider]  furosemide (LASIX) 40 MG tablet Take 40 mg by mouth as needed. prn   Yes [provider]  loratadine (CLARITIN) 10 MG tablet Take 10 mg by mouth daily. AM   Yes [provider]  meloxicam (MOBIC) 7.5 MG tablet Take 1 tablet by mouth 2 (two) times daily. 12/18/16  Yes [provider]  ranitidine (ZANTAC) 75 MG tablet Take 75 mg by mouth daily.   Yes [provider]  spironolactone (ALDACTONE) 25 MG tablet Take 25 mg by mouth daily. AM   Yes [provider]  tamsulosin (FLOMAX) 0.4 MG CAPS capsule Take 0.4 mg by mouth daily. AM   Yes [provider]  testosterone cypionate (DEPOTESTOTERONE CYPIONATE) 200 MG/ML injection Inject 60 mg into the muscle every 7 (seven) days.    Yes [provider]  amLODipine (NORVASC) 5 MG tablet Take 1 tablet by mouth daily. 07/14/15   [provider]  chlorpheniramine-HYDROcodone (TUSSIONEX PENNKINETIC ER) 10-8  MG/5ML SUER Take 5 mLs by mouth at bedtime as needed for cough. 07/26/17   Duanne Guess, PA-C  doxycycline (VIBRAMYCIN) 100 MG capsule Take 1 capsule (100 mg total) by mouth 2 (two) times daily for 10 days. 07/26/17 08/05/17  Duanne Guess, PA-C  DULoxetine (CYMBALTA) 20 MG capsule Take 30 mg by mouth daily.     [provider]  magnesium citrate SOLN Take 15 mLs by mouth daily. Reported on 06/05/2015    [provider]  naloxone [facility]) 0.4 MG/ML injection Inject 0.4 mg into the muscle as needed. Reported on 08/08/2015    [provider]  NIACIN PO Supplemental powder-reconstituted 07/11/15   [provider]  predniSONE (DELTASONE) 20 MG tablet Take 2 tablets (40 mg total) by mouth daily. 07/26/17   Duanne Guess, PA-C  Family History Family History  Problem Relation Age of Onset  . Arthritis Mother   . Asthma Mother   . Cancer Mother   . Heart disease Father     Social History Social History   Tobacco Use  . Smoking status: Never Smoker  . Smokeless tobacco: Never Used  Substance Use Topics  . Alcohol use: Yes    Alcohol/week: 0.6 oz    Types: 1 Glasses of wine per week  . Drug use: No     Allergies   No known allergies   Review of Systems Review of Systems  Constitutional: Negative for fever.  HENT: Positive for congestion, rhinorrhea, sinus pressure, sinus pain and sore throat.   Respiratory: Positive for cough. Negative for shortness of breath, wheezing and stridor.   Gastrointestinal: Negative for diarrhea, nausea and vomiting.  Musculoskeletal: Positive for myalgias. Negative for arthralgias and neck stiffness.  Skin: Negative for rash.  Neurological: Negative for dizziness.     Physical Exam Triage Vital Signs ED Triage Vitals  Enc Vitals Group     BP 07/26/17 1123 137/84     Pulse Rate 07/26/17 1123 70     Resp 07/26/17 1123 18     Temp 07/26/17 1123 98.9 F (37.2 C)     Temp Source 07/26/17 1123 Oral     SpO2  07/26/17 1123 97 %     Weight 07/26/17 1124 225 lb (102.1 kg)     Height 07/26/17 1124 5\' 9"  (1.753 m)     Head Circumference --      Peak Flow --      Pain Score 07/26/17 1124 0     Pain Loc --      Pain Edu? --      Excl. in [place]? --    No data found.  Updated Vital Signs BP 137/84 (BP Location: Left Arm)   Pulse 70   Temp 98.9 F (37.2 C) (Oral)   Resp 18   Ht 5\' 9"  (1.753 m)   Wt 225 lb (102.1 kg)   SpO2 97%   BMI 33.23 kg/m   Visual Acuity Right Eye Distance:   Left Eye Distance:   Bilateral Distance:    Right Eye Near:   Left Eye Near:    Bilateral Near:     Physical Exam  Constitutional: He is oriented to person, place, and time. He appears well-developed and well-nourished. No distress.  HENT:  Head: Normocephalic and atraumatic.  Right Ear: Hearing, tympanic membrane, external ear and ear canal normal.  Left Ear: Hearing, tympanic membrane, external ear and ear canal normal.  Nose: Rhinorrhea present. No nasal septal hematoma. Right sinus exhibits no maxillary sinus tenderness and no frontal sinus tenderness. Left sinus exhibits no maxillary sinus tenderness and no frontal sinus tenderness.  Mouth/Throat: Oropharynx is clear and moist. No trismus in the jaw. No uvula swelling. No oropharyngeal exudate, posterior oropharyngeal edema, posterior oropharyngeal erythema or tonsillar abscesses.  No pharyngeal erythema or exudates.  No signs of peritonsillar abscess.  Positive maxillary sinus tenderness.  No frontal sinus tenderness.  Eyes: Conjunctivae are normal.  Neck: Normal range of motion.  Cardiovascular: Normal rate and regular rhythm.  Pulmonary/Chest: Effort normal. No stridor. No respiratory distress. He has no wheezes. He has no rales. He exhibits no tenderness.  Musculoskeletal: Normal range of motion.  Lymphadenopathy:    He has no cervical adenopathy.  Neurological: He is alert and oriented to person, place, and time.  Skin: Skin is warm  and dry. No  rash noted.  Psychiatric: He has a normal mood and affect. His behavior is normal. Judgment and thought content normal.     UC Treatments / Results  Labs (all labs ordered are listed, but only abnormal results are displayed) Labs Reviewed - No data to display  EKG  EKG Interpretation None       Radiology No results found.  Procedures Procedures (including critical care time)  Medications Ordered in UC Medications - No data to display   Initial Impression / Assessment and Plan / UC Course  I have reviewed the triage vital signs and the nursing notes.  Pertinent labs & imaging results that were available during my care of the patient were reviewed by me and considered in my medical decision making (see chart for details).     67 year old male with acute maxillary sinusitis with bronchitis.  Is given prescription for nighttime cough medication.  Will continue with over-the-counter expectorant and decongestant and daytime cough medication.  He is also given prednisone 40 mg daily for 5 days.  He will use nasal saline flushes and is educated on signs and symptoms return to clinic for.  Final Clinical Impressions(s) / UC Diagnoses   Final diagnoses:  Acute non-recurrent maxillary sinusitis  Bronchitis    ED Discharge Orders        Ordered    chlorpheniramine-HYDROcodone (TUSSIONEX PENNKINETIC ER) 10-8 MG/5ML SUER  At bedtime PRN     07/26/17 1156    doxycycline (VIBRAMYCIN) 100 MG capsule  2 times daily     07/26/17 1156    predniSONE (DELTASONE) 20 MG tablet  Daily     07/26/17 1156         Duanne Guess, [place] 07/26/17 1212

## 2017-07-26 NOTE — Discharge Instructions (Signed)
Please take medications as prescribed and continue with over-the-counter guaifenesin and phenylephrine.  Make sure drinking lots of fluids.  Continue with nasal saline flushes.  If any shortness of breath, worsening cough, fevers return to the clinic.

## 2017-07-26 NOTE — ED Triage Notes (Signed)
PAtient started having severe symptoms of nasal congestion, cough, and chest congestion 4 days ago. Patient reports having sinus symptoms for the past few weeks.

## 2017-07-29 ENCOUNTER — Telehealth: Payer: Self-pay

## 2017-07-29 NOTE — Telephone Encounter (Signed)
Called to follow up with patient since visit here at [facility]. Patient instructed to call back with any questions or concerns. MAH  

## 2017-08-05 ENCOUNTER — Other Ambulatory Visit: Payer: Self-pay | Admitting: Orthopedic Surgery

## 2017-08-05 DIAGNOSIS — M5441 Lumbago with sciatica, right side: Principal | ICD-10-CM

## 2017-08-05 DIAGNOSIS — G9519 Other vascular myelopathies: Secondary | ICD-10-CM

## 2017-08-05 DIAGNOSIS — M5442 Lumbago with sciatica, left side: Principal | ICD-10-CM

## 2017-08-05 DIAGNOSIS — G8929 Other chronic pain: Secondary | ICD-10-CM

## 2017-08-05 DIAGNOSIS — M48062 Spinal stenosis, lumbar region with neurogenic claudication: Secondary | ICD-10-CM

## 2017-08-05 DIAGNOSIS — Z9889 Other specified postprocedural states: Secondary | ICD-10-CM

## 2017-08-12 ENCOUNTER — Inpatient Hospital Stay: Payer: Medicare Other | Attending: Internal Medicine

## 2017-08-12 ENCOUNTER — Inpatient Hospital Stay: Payer: Medicare Other

## 2017-08-12 ENCOUNTER — Inpatient Hospital Stay (HOSPITAL_BASED_OUTPATIENT_CLINIC_OR_DEPARTMENT_OTHER): Payer: Medicare Other | Admitting: Internal Medicine

## 2017-08-12 ENCOUNTER — Other Ambulatory Visit: Payer: Self-pay

## 2017-08-12 VITALS — BP 150/90 | HR 82 | Resp 18

## 2017-08-12 DIAGNOSIS — M549 Dorsalgia, unspecified: Secondary | ICD-10-CM | POA: Diagnosis not present

## 2017-08-12 DIAGNOSIS — Z79899 Other long term (current) drug therapy: Secondary | ICD-10-CM | POA: Insufficient documentation

## 2017-08-12 DIAGNOSIS — Z79890 Hormone replacement therapy (postmenopausal): Secondary | ICD-10-CM | POA: Diagnosis not present

## 2017-08-12 DIAGNOSIS — G629 Polyneuropathy, unspecified: Secondary | ICD-10-CM | POA: Insufficient documentation

## 2017-08-12 DIAGNOSIS — G8929 Other chronic pain: Secondary | ICD-10-CM | POA: Insufficient documentation

## 2017-08-12 DIAGNOSIS — D751 Secondary polycythemia: Secondary | ICD-10-CM | POA: Diagnosis present

## 2017-08-12 DIAGNOSIS — K7460 Unspecified cirrhosis of liver: Secondary | ICD-10-CM | POA: Insufficient documentation

## 2017-08-12 LAB — HEMOGLOBIN: HEMOGLOBIN: 18.4 g/dL — AB (ref 13.0–18.0)

## 2017-08-12 LAB — HEMATOCRIT: HEMATOCRIT: 53.1 % — AB (ref 40.0–52.0)

## 2017-08-12 NOTE — Assessment & Plan Note (Addendum)
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 Patient denies any improvement of his symptoms post phlebotomy.  # Back pain- chronic STABLE; awaiting MRI next week.   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 Related to back pain.   # recheck  H&H every 6 months/phlebotomy; follow up with me in 12 months/MD.

## 2017-08-12 NOTE — Progress Notes (Signed)
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   # chronic back pain.   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   Patient last phlebotomy was approximately 6 months ago when his hematocrit was 53.  Patient has multiple complaints about his ongoing back pain; he is awaiting MRI/evaluation with orthopedic surgery  He denies any burning pain in his hand and feet. No history of blood clots and strokes.  REVIEW OF SYSTEMS:  A complete 10 point review of system is done which is negative except mentioned above/history of present illness.   PAST MEDICAL HISTORY :  Past Medical History:  Diagnosis Date  . Arthritis   . Back pain    MULTIPLE BACK SURGERIES  . Benign essential HTN 12/07/2014   Last Assessment & Plan:  Relevant Hx: Course: Daily Update: Today's Plan: CONTROLLED ON MEDS  . 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/31/2013  . Chronic back pain   . 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 Cirrhosis ([place])   . DDD (degenerative disc disease), cervical 11/06/2014  . DDD (degenerative disc disease), lumbar 11/06/2014  . 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   Overview:  severe in feet   . 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 Erythrocytosis   . Esophagitis, reflux 12/07/2014  . GERD (gastroesophageal reflux disease)   . HCV (hepatitis C virus) 12/07/2014  . Hepatitis C    DID HARVONI TREATMENT/ NO LONGER HEP C POSITIVE  . Idiopathic scoliosis and kyphoscoliosis 03/01/2013  . Neuropathy of both feet   . Painful legs and moving toes    DUE TO BACK PROBLEMS  . Prostatitis   .  Sacroiliac joint dysfunction 11/06/2014  . Spinal stenosis 12/07/2014  . Testicular dysfunction 11/04/2011   Overview:  Dr. Jacqlyn Larsen is his urologist.     PAST SURGICAL HISTORY :   Past Surgical History:  Procedure Laterality Date  . BACK SURGERY  04/14/13   L2-S1  . 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 CATARACT EXTRACTION Left 1989  . CERVICAL SPINE SURGERY      X2 FUSION C4-7  . CHOLECYSTECTOMY  07/2013  . COLONOSCOPY WITH PROPOFOL N/A 12/18/2014   Procedure: COLONOSCOPY WITH PROPOFOL;  Surgeon: Lucilla Lame, MD;  Location: [facility];  Service: Endoscopy;  Laterality: N/A;  . ELBOW ARTHROSCOPY Right 02/2012  . ESOPHAGOGASTRODUODENOSCOPY (EGD) WITH PROPOFOL N/A 12/18/2014   Procedure: ESOPHAGOGASTRODUODENOSCOPY (EGD) WITH PROPOFOL with dialtion;  Surgeon: Lucilla Lame, MD;  Location: [facility];  Service: Endoscopy;  Laterality: N/A;  . FACIAL RECONSTRUCTION SURGERY  1970   BOTTLE CUT NOSE AND EYE, MULTIPLE SURGERIES  . FINGER SURGERY Left 2015   THUMB  . FINGER SURGERY Right 1966   MIDDLE  . FOOT SURGERY Right 1984  . FUNDIPLICATION  0865  . HERNIA REPAIR Left 1981/1957   RIGHT SIDE IN [phone]  . MASS EXCISION Left 02/16/2015   Procedure: LEFT BURSA ELBOW EXCISION;  Surgeon: Leanor Kail, MD;  Location: [facility];  Service: Orthopedics;  Laterality: Left;  . NISSEN FUNDOPLICATION  9629  . NOSE SURGERY  12/01/02   SEPTUM RECONSTRUCTION  . POLYPECTOMY  12/18/2014   Procedure: POLYPECTOMY;  Surgeon: Lucilla Lame, MD;  Location: [facility];  Service: Endoscopy;;  . SEPTOPLASTY  1999  . SHOULDER SURGERY Right 1983  . SHOULDER SURGERY Left 1979   WITH ELBOW (TENDON RELEASE)  . SKIN SURGERY  2004   SKIN CANCER NOSE    FAMILY HISTORY :   Family History  Problem Relation Age of Onset  . Arthritis Mother   . Asthma Mother   . Cancer Mother   . Heart disease Father     SOCIAL HISTORY:   Social History   Tobacco Use  . Smoking status: Never Smoker  .  Smokeless tobacco: Never Used  Substance Use Topics  . Alcohol use: Yes    Alcohol/week: 0.6 oz    Types: 1 Glasses of wine per week  . Drug use: No    ALLERGIES:  is allergic to no known allergies.  MEDICATIONS:  Current Outpatient Medications  Medication Sig Dispense Refill  . amLODipine (NORVASC) 10 MG tablet Take 10 mg by mouth daily.    [name] Kitchen amLODipine (NORVASC) 5 MG tablet Take 1 tablet by mouth daily.    [name] Kitchen aspirin 81 MG tablet Take 81 mg by mouth daily.    . chlorpheniramine-HYDROcodone (TUSSIONEX PENNKINETIC ER) 10-8 MG/5ML SUER Take 5 mLs by mouth at bedtime as needed for cough. 60 mL 0  . citalopram (CELEXA) 20 MG tablet Take 20 mg by mouth daily.    . clonazePAM (KLONOPIN) 0.5 MG tablet Take 1 tablet by mouth 2 (two) times daily as needed for anxiety.    . DULoxetine (CYMBALTA) 20 MG capsule Take 30 mg by mouth daily.     . furosemide (LASIX) 40 MG tablet Take 40 mg by mouth as needed. prn    . loratadine (CLARITIN) 10 MG tablet Take 10 mg by mouth daily. AM    . magnesium citrate SOLN Take 15 mLs by mouth daily. Reported on 06/05/2015    . meloxicam (MOBIC) 7.5 MG tablet Take 1 tablet by mouth 2 (two) times daily.    . naloxone (NARCAN) 0.4 MG/ML injection Inject 0.4 mg into the muscle as needed. Reported on 08/08/2015    . NIACIN PO Supplemental powder-reconstituted    . predniSONE (DELTASONE) 20 MG tablet Take 2 tablets (40 mg total) by mouth daily. 10 tablet 0  . ranitidine (ZANTAC) 75 MG tablet Take 75 mg by mouth daily.    [name] Kitchen spironolactone (ALDACTONE) 25 MG tablet Take 25 mg by mouth daily. AM    . tamsulosin (FLOMAX) 0.4 MG CAPS capsule Take 0.4 mg by mouth daily. AM    . testosterone cypionate (DEPOTESTOTERONE CYPIONATE) 200 MG/ML injection Inject 60 mg into the muscle every 7 (seven) days.      No current facility-administered medications for this visit.     PHYSICAL EXAMINATION:   BP (!) 153/98 (Patient Position: Sitting)   Pulse 80   Temp 97.9 F (36.6 C)  (Tympanic)   Resp (!) 22   Ht 5\' 9"  (1.753 m)   Wt 222 lb (100.7 kg)   BMI 32.78 kg/m   Filed Weights   08/12/17 1427  Weight: 222 lb (100.7 kg)    GENERAL: Well-nourished well-developed; Alert, no distress and comfortable.   Alone.  EYES: no pallor or icterus OROPHARYNX: no thrush or ulceration; good dentition  NECK: supple, no masses felt LYMPH:  no palpable lymphadenopathy in the cervical, axillary or inguinal regions LUNGS: clear to auscultation and  No wheeze or crackles  HEART/CVS: regular rate & rhythm and no murmurs; No lower extremity edema ABDOMEN:abdomen soft, non-tender and normal bowel sounds Musculoskeletal:no cyanosis of digits and no clubbing  PSYCH: alert & oriented x 3 with fluent speech NEURO: no focal motor/sensory deficits SKIN:  no rashes or significant lesions  LABORATORY DATA:  I have reviewed the data as listed    Component Value Date/Time   NA 137 04/18/2016 0842   NA 137 02/22/2012 0645   K 3.4 (L) 04/18/2016 0842   K 4.6 02/22/2012 0645   CL 99 (L) 04/18/2016 0842   CL 103 02/22/2012 0645   CO2 31 04/18/2016 0842   CO2 30 02/22/2012 0645   GLUCOSE 103 (H) 04/18/2016 0842   GLUCOSE 90 02/22/2012 0645   BUN 18 04/18/2016 0842   BUN 9 02/22/2012 0645   CREATININE 0.98 04/18/2016 0842   CREATININE 0.97 02/22/2012 0645   CALCIUM 8.7 (L) 04/18/2016 0842   CALCIUM 8.5 02/22/2012 0645   PROT 7.5 04/18/2016 0842   PROT 6.9 02/22/2012 0645   ALBUMIN 4.0 04/18/2016 0842   ALBUMIN 3.1 (L) 02/22/2012 0645   AST 85 (H) 04/18/2016 0842   AST 80 (H) 02/22/2012 0645   ALT 54 04/18/2016 0842   ALT 96 (H) 02/22/2012 0645   ALKPHOS 58 04/18/2016 0842   ALKPHOS 47 (L) 02/22/2012 0645   BILITOT 0.8 04/18/2016 0842   BILITOT 0.3 02/22/2012 0645   GFRNONAA >60 04/18/2016 0842   GFRNONAA >60 02/22/2012 0645   GFRAA >60 04/18/2016 0842   GFRAA >60 02/22/2012 0645    No results found for: SPEP, UPEP  Lab Results  Component Value Date   WBC 5.5  04/18/2016   NEUTROABS 2.8 04/18/2016   HGB 18.4 (H) 08/12/2017   HCT 53.1 (H) 08/12/2017   MCV 93.1 04/18/2016   PLT 176 04/18/2016      Chemistry      Component Value Date/Time   NA 137 04/18/2016 0842   NA 137 02/22/2012 0645   K 3.4 (L) 04/18/2016 0842   K 4.6 02/22/2012 0645   CL 99 (L) 04/18/2016 0842   CL 103 02/22/2012 0645   CO2 31 04/18/2016 0842   CO2 30 02/22/2012 0645   BUN 18 04/18/2016 0842   BUN 9 02/22/2012 0645   CREATININE 0.98 04/18/2016 0842   CREATININE 0.97 02/22/2012 0645      Component Value Date/Time   CALCIUM 8.7 (L) 04/18/2016 0842   CALCIUM 8.5 02/22/2012 0645   ALKPHOS 58 04/18/2016 0842   ALKPHOS 47 (L) 02/22/2012 0645   AST 85 (H) 04/18/2016 0842   AST 80 (H) 02/22/2012 0645   ALT 54 04/18/2016 0842   ALT 96 (H) 02/22/2012 0645   BILITOT 0.8 04/18/2016 0842   BILITOT 0.3 02/22/2012 0645        ASSESSMENT & PLAN:   Secondary erythrocytosis # 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 Patient denies any improvement of his symptoms post phlebotomy.  # Back pain- chronic STABLE; awaiting MRI next week.   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 Related to back pain.   # recheck  H&H every 6 months/phlebotomy; follow up with me in 12 months/MD.      Cammie Sickle, MD 08/12/2017 3:31 PM

## 2017-08-18 ENCOUNTER — Ambulatory Visit
Admission: RE | Admit: 2017-08-18 | Discharge: 2017-08-18 | Disposition: A | Discharge status: Home / Self Care | Payer: Medicare Other | Source: Ambulatory Visit | Attending: Orthopedic Surgery | Admitting: Orthopedic Surgery

## 2017-08-18 DIAGNOSIS — M48061 Spinal stenosis, lumbar region without neurogenic claudication: Secondary | ICD-10-CM | POA: Insufficient documentation

## 2017-08-18 DIAGNOSIS — Z9889 Other specified postprocedural states: Secondary | ICD-10-CM

## 2017-08-18 DIAGNOSIS — G8929 Other chronic pain: Secondary | ICD-10-CM | POA: Diagnosis not present

## 2017-08-18 DIAGNOSIS — G9519 Other vascular myelopathies: Secondary | ICD-10-CM

## 2017-08-18 DIAGNOSIS — M5441 Lumbago with sciatica, right side: Secondary | ICD-10-CM

## 2017-08-18 DIAGNOSIS — M48062 Spinal stenosis, lumbar region with neurogenic claudication: Secondary | ICD-10-CM

## 2017-08-18 DIAGNOSIS — M5126 Other intervertebral disc displacement, lumbar region: Secondary | ICD-10-CM | POA: Diagnosis not present

## 2017-08-18 DIAGNOSIS — M5442 Lumbago with sciatica, left side: Secondary | ICD-10-CM | POA: Diagnosis present

## 2017-11-05 DIAGNOSIS — F32A Depression, unspecified: Secondary | ICD-10-CM | POA: Insufficient documentation

## 2017-11-05 DIAGNOSIS — F419 Anxiety disorder, unspecified: Secondary | ICD-10-CM

## 2017-11-05 DIAGNOSIS — F329 Major depressive disorder, single episode, unspecified: Secondary | ICD-10-CM | POA: Insufficient documentation

## 2017-11-12 DIAGNOSIS — M5432 Sciatica, left side: Secondary | ICD-10-CM

## 2017-11-12 DIAGNOSIS — M5431 Sciatica, right side: Secondary | ICD-10-CM | POA: Insufficient documentation

## 2018-02-10 ENCOUNTER — Inpatient Hospital Stay: Payer: Medicare Other | Attending: Internal Medicine

## 2018-02-10 ENCOUNTER — Inpatient Hospital Stay: Payer: Medicare Other

## 2018-02-10 ENCOUNTER — Other Ambulatory Visit: Payer: Self-pay

## 2018-02-10 DIAGNOSIS — D751 Secondary polycythemia: Secondary | ICD-10-CM | POA: Diagnosis present

## 2018-02-10 LAB — HEMOGLOBIN: HEMOGLOBIN: 16.8 g/dL (ref 13.0–18.0)

## 2018-02-10 LAB — HEMATOCRIT: HEMATOCRIT: 49.6 % (ref 40.0–52.0)

## 2018-07-04 NOTE — Progress Notes (Signed)
07/05/2018  3:45 PM   [name] 12-02-1950 [id number]  Referring provider: Sofie Hartigan, MD [name], [place] [address]  Chief Complaint  Patient presents with  . Establish Care    HPI: [name] is a 68 y.o. male who presents today to establish care here; he is a former patient of Dr. Jacqlyn Larsen.  He has a history of BPH with urinary obstruction, prostatitis, hypogonadism and stone disease.  He is currently taking tamsulosin, testosterone.  Patient normally injects his 0.6 mL testosterone on Mondays; blood tests are done every 6 months.  He had stable blood work July 2019.  He is followed by hematology for secondary erythrocytosis.  Primary symptoms are tiredness and fatigue which have improved on TRT.  Patient  PMH: Past Medical History:  Diagnosis Date  . Arthritis   . Back pain    MULTIPLE BACK SURGERIES  . Benign essential HTN 12/07/2014   Last Assessment & Plan:  Relevant Hx: Course: Daily Update: Today's Plan: CONTROLLED ON MEDS  . 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/31/2013  . Chronic back pain   . 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 Cirrhosis ([place])   . DDD (degenerative disc disease), cervical 11/06/2014  . DDD (degenerative disc disease), lumbar 11/06/2014  . 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   Overview:  severe in feet   . 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 Erythrocytosis   . Esophagitis, reflux 12/07/2014  . GERD (gastroesophageal reflux disease)   . HCV (hepatitis C virus) 12/07/2014  . Hepatitis C    DID HARVONI TREATMENT/ NO LONGER HEP C POSITIVE  . Idiopathic scoliosis and kyphoscoliosis 03/01/2013  . Neuropathy of both feet   . Painful legs and moving toes    DUE TO BACK PROBLEMS  . Prostatitis   . Sacroiliac joint dysfunction 11/06/2014  . Spinal stenosis 12/07/2014  . Testicular dysfunction 11/04/2011   Overview:  Dr. Jacqlyn Larsen is his urologist.      Surgical History: Past Surgical History:  Procedure Laterality Date  . BACK SURGERY  04/14/13   L2-S1  . 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 CATARACT EXTRACTION Left 1989  . CERVICAL SPINE SURGERY      X2 FUSION C4-7  . CHOLECYSTECTOMY  07/2013  . COLONOSCOPY WITH PROPOFOL N/A 12/18/2014   Procedure: COLONOSCOPY WITH PROPOFOL;  Surgeon: Lucilla Lame, MD;  Location: [facility];  Service: Endoscopy;  Laterality: N/A;  . ELBOW ARTHROSCOPY Right 02/2012  . ESOPHAGOGASTRODUODENOSCOPY (EGD) WITH PROPOFOL N/A 12/18/2014   Procedure: ESOPHAGOGASTRODUODENOSCOPY (EGD) WITH PROPOFOL with dialtion;  Surgeon: Lucilla Lame, MD;  Location: [name];  Service: Endoscopy;  Laterality: N/A;  . FACIAL RECONSTRUCTION SURGERY  1970   BOTTLE CUT NOSE AND EYE, MULTIPLE SURGERIES  . FINGER SURGERY Left 2015   THUMB  . FINGER SURGERY Right 1966   MIDDLE  . FOOT SURGERY Right 1984  . FUNDIPLICATION  9678  . HERNIA REPAIR Left 1981/1957   RIGHT SIDE IN [phone]  . MASS EXCISION Left 02/16/2015   Procedure: LEFT BURSA ELBOW EXCISION;  Surgeon: Leanor Kail, MD;  Location: [facility];  Service: Orthopedics;  Laterality: Left;  . NISSEN FUNDOPLICATION  0175  . NOSE SURGERY  12/01/02   SEPTUM RECONSTRUCTION  . POLYPECTOMY  12/18/2014   Procedure: POLYPECTOMY;  Surgeon: Lucilla Lame, MD;  Location: [facility];  Service: Endoscopy;;  . SEPTOPLASTY  [place]  . SHOULDER SURGERY Left 1979   WITH ELBOW (TENDON RELEASE)  . SKIN SURGERY  2004   SKIN CANCER NOSE    Home Medications:  Allergies as of 07/05/2018      Reactions   No Known Allergies       Medication List       Accurate as of July 05, 2018  3:45 PM. Always use your most recent med list.        amLODipine 10 MG tablet Commonly known as:  NORVASC Take 10 mg by mouth daily.   aspirin 81 MG tablet Take 81 mg by mouth daily.   B-D 3CC LUER-LOK SYR 23GX1" 23G X 1" 3 ML Misc Generic  drug:  SYRINGE-NEEDLE (DISP) 3 ML USE UTD FOR TESTOSTERONE INJECTION   BD DISP NEEDLE 23G X 1" Misc Generic drug:  NEEDLE (DISP) 23 G Use 1 Units every 10 (ten) days.   citalopram 20 MG tablet Commonly known as:  CELEXA Take 20 mg by mouth daily.   clonazePAM 0.5 MG tablet Commonly known as:  KLONOPIN Take 1 tablet by mouth 2 (two) times daily as needed for anxiety.   cyclobenzaprine 5 MG tablet Commonly known as:  FLEXERIL Take by mouth.   DULoxetine 20 MG capsule Commonly known as:  CYMBALTA Take 30 mg by mouth daily.   furosemide 40 MG tablet Commonly known as:  LASIX Take 40 mg by mouth as needed. prn   loratadine 10 MG tablet Commonly known as:  CLARITIN Take 10 mg by mouth daily. AM   magnesium citrate Soln Take 15 mLs by mouth daily. Reported on 06/05/2015   meloxicam 7.5 MG tablet Commonly known as:  MOBIC Take 1 tablet by mouth 2 (two) times daily.   naloxone 0.4 MG/ML injection Commonly known as:  NARCAN Inject 0.4 mg into the muscle as needed. Reported on 08/08/2015   NIACIN PO Supplemental powder-reconstituted   ranitidine 75 MG tablet Commonly known as:  ZANTAC Take 75 mg by mouth daily.   sildenafil 20 MG tablet Commonly known as:  REVATIO Take by mouth.   spironolactone 25 MG tablet Commonly known as:  ALDACTONE Take 25 mg by mouth daily. AM   tamsulosin 0.4 MG Caps capsule Commonly known as:  FLOMAX Take 0.4 mg by mouth daily. AM   Testosterone Cypionate 200 MG/ML Soln INJECT 0.6 ML IN THE MUSCLE ONCE A WEEK       Allergies:  Allergies  Allergen Reactions  . No Known Allergies     Family History: Family History  Problem Relation Age of Onset  . Arthritis Mother   . Asthma Mother   . Cancer Mother   . Heart disease Father     Social History:  reports that he has never smoked. He has never used smokeless tobacco. He reports current alcohol use of about 1.0 standard drinks of alcohol per week. He reports that he does not use  drugs.  ROS: UROLOGY Frequent Urination?: Yes Hard to postpone urination?: Yes Burning/pain with urination?: No Get up at night to urinate?: Yes Leakage of urine?: Yes Urine stream starts and stops?: No Trouble starting stream?: No Do you have to strain to urinate?: No Blood in urine?: No Urinary tract infection?: No Sexually transmitted disease?: No Injury to kidneys or bladder?: No Painful intercourse?: No Weak stream?: No Erection problems?: No Penile pain?: No  Gastrointestinal Nausea?: No Vomiting?: No Indigestion/heartburn?: No Diarrhea?: No Constipation?: No  Constitutional  Fever: No Night sweats?: No Weight loss?: No Fatigue?: No  Skin Skin rash/lesions?: No Itching?: No  Eyes Blurred vision?: No Double vision?: No  Ears/Nose/Throat Sore throat?: No Sinus problems?: Yes  Hematologic/Lymphatic Swollen glands?: No Easy bruising?: No  Cardiovascular Leg swelling?: Yes Chest pain?: No  Respiratory Cough?: No Shortness of breath?: No  Endocrine Excessive thirst?: No  Musculoskeletal Back pain?: Yes Joint pain?: Yes  Neurological Headaches?: No Dizziness?: No  Psychologic Depression?: Yes Anxiety?: Yes  Physical Exam: BP (!) 141/82 (BP Location: Left Arm, Patient Position: Sitting, Cuff Size: Large)   Pulse 75   Ht 5\' 9"  (1.753 m)   Wt 220 lb (99.8 kg)   BMI 32.49 kg/m   Constitutional:  Well nourished. Alert and oriented, No acute distress. Cardiovascular: No clubbing, cyanosis, or edema. Respiratory: Normal respiratory effort, no increased work of breathing. Skin: No rashes, bruises or suspicious lesions. Neurologic: Grossly intact, no focal deficits, moving all 4 extremities. Psychiatric: Normal mood and affect.  Laboratory Data: Lab Results  Component Value Date   WBC 5.5 04/18/2016   HGB 16.8 02/10/2018   HCT 49.6 02/10/2018   MCV 93.1 04/18/2016   PLT 176 04/18/2016   Assessment & Plan:    1. Hypogonadism -  Stable symptoms on TRT - Patient has a [facility] up [facility] at [facility] next week and will request testosterone level then - 6 months follow-up   2. BPH - Stable LUTS on tamsulosin -PVR by bladder scan was elevated at 273 mL-follow-up nurse visit/bladder scan in 1 month for recheck  Return in about 6 months (around 01/03/2019) for [place] and labs.   Abbie Sons, [place] [address] [place], [place] [address] [phone]  I, Adele Schilder, am acting as a scribe for John Giovanni, MD.    I, Abbie Sons, MD, have reviewed all documentation for this visit. The documentation on 07/05/18 for the exam, diagnosis, procedures, and orders are all accurate and complete.

## 2018-07-05 ENCOUNTER — Encounter: Payer: Self-pay | Admitting: Urology

## 2018-07-05 ENCOUNTER — Ambulatory Visit (INDEPENDENT_AMBULATORY_CARE_PROVIDER_SITE_OTHER): Payer: Medicare Other | Admitting: Urology

## 2018-07-05 VITALS — BP 141/82 | HR 75 | Ht 69.0 in | Wt 220.0 lb

## 2018-07-05 DIAGNOSIS — N138 Other obstructive and reflux uropathy: Secondary | ICD-10-CM | POA: Diagnosis not present

## 2018-07-05 DIAGNOSIS — E291 Testicular hypofunction: Secondary | ICD-10-CM | POA: Diagnosis not present

## 2018-07-05 DIAGNOSIS — N401 Enlarged prostate with lower urinary tract symptoms: Secondary | ICD-10-CM | POA: Diagnosis not present

## 2018-07-05 LAB — BLADDER SCAN AMB NON-IMAGING

## 2018-07-05 LAB — URINALYSIS, COMPLETE
Bilirubin, UA: NEGATIVE
GLUCOSE, UA: NEGATIVE
Ketones, UA: NEGATIVE
Leukocytes, UA: NEGATIVE
NITRITE UA: NEGATIVE
Protein, UA: NEGATIVE
RBC UA: NEGATIVE
Specific Gravity, UA: 1.01 (ref 1.005–1.030)
UUROB: 0.2 mg/dL (ref 0.2–1.0)
pH, UA: 5 (ref 5.0–7.5)

## 2018-07-05 LAB — MICROSCOPIC EXAMINATION
Bacteria, UA: NONE SEEN
Epithelial Cells (non renal): NONE SEEN /hpf (ref 0–10)
RBC MICROSCOPIC, UA: NONE SEEN /HPF (ref 0–2)

## 2018-07-05 NOTE — Patient Instructions (Signed)

## 2018-08-06 ENCOUNTER — Ambulatory Visit (INDEPENDENT_AMBULATORY_CARE_PROVIDER_SITE_OTHER): Payer: Medicare Other | Admitting: Family Medicine

## 2018-08-06 DIAGNOSIS — N401 Enlarged prostate with lower urinary tract symptoms: Secondary | ICD-10-CM

## 2018-08-06 DIAGNOSIS — N138 Other obstructive and reflux uropathy: Secondary | ICD-10-CM

## 2018-08-06 LAB — BLADDER SCAN AMB NON-IMAGING: Scan Result: 181

## 2018-08-06 MED ORDER — TAMSULOSIN HCL 0.4 MG PO CAPS: 0.4000 mg | ORAL_CAPSULE | Freq: Two times a day (BID) | ORAL | 1 refills | Status: DC

## 2018-08-06 NOTE — Progress Notes (Signed)
Patient presents today for a PVR. The residual was 181ML. I reviewed the result with Dr. Bernardo Heater and he states patient is to take Tamsulosin 2 times daily and come back for a PVR in 1 month.

## 2018-08-11 ENCOUNTER — Inpatient Hospital Stay: Payer: Medicare Other | Attending: Internal Medicine

## 2018-08-11 ENCOUNTER — Encounter: Payer: Self-pay | Admitting: Internal Medicine

## 2018-08-11 ENCOUNTER — Other Ambulatory Visit: Payer: Self-pay

## 2018-08-11 ENCOUNTER — Inpatient Hospital Stay: Payer: Medicare Other

## 2018-08-11 ENCOUNTER — Inpatient Hospital Stay (HOSPITAL_BASED_OUTPATIENT_CLINIC_OR_DEPARTMENT_OTHER): Payer: Medicare Other | Admitting: Internal Medicine

## 2018-08-11 VITALS — BP 129/82 | HR 61 | Temp 97.6°F | Resp 20

## 2018-08-11 DIAGNOSIS — E291 Testicular hypofunction: Secondary | ICD-10-CM | POA: Diagnosis not present

## 2018-08-11 DIAGNOSIS — Z79890 Hormone replacement therapy (postmenopausal): Secondary | ICD-10-CM | POA: Diagnosis not present

## 2018-08-11 DIAGNOSIS — D751 Secondary polycythemia: Secondary | ICD-10-CM | POA: Insufficient documentation

## 2018-08-11 LAB — HEMOGLOBIN: Hemoglobin: 16.2 g/dL (ref 13.0–17.0)

## 2018-08-11 LAB — HEMATOCRIT: HCT: 47.3 % (ref 39.0–52.0)

## 2018-08-11 NOTE — Progress Notes (Signed)
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   Patient has not had any recent phlebotomies.  His energy levels are okay.  Admits to chronic tingling and numbness secondary to his chronic back pain.  He had back surgery-pain improving.   Review of Systems  Constitutional: Negative for chills, diaphoresis, fever, malaise/fatigue and weight loss.  HENT: Negative for nosebleeds and sore throat.   Eyes: Negative for double vision.  Respiratory: Negative for cough, hemoptysis, sputum production, shortness of breath and wheezing.   Cardiovascular: Negative for chest pain, palpitations, orthopnea and leg swelling.  Gastrointestinal: Negative for abdominal pain, blood in stool, constipation, diarrhea, heartburn, melena, nausea and vomiting.  Genitourinary: Negative for dysuria, frequency and urgency.  Musculoskeletal: Positive for back pain and joint pain.  Skin: Positive for rash. Negative for itching.  Neurological: Negative for dizziness, tingling, focal weakness, weakness and headaches.  Endo/Heme/Allergies: Does not bruise/bleed easily.  Psychiatric/Behavioral: Negative for depression. The patient is not nervous/anxious and does not have insomnia.      PAST MEDICAL HISTORY :  Past Medical History:  Diagnosis Date  . Arthritis   . Back pain    MULTIPLE BACK SURGERIES  . Benign essential HTN 12/07/2014   Last Assessment & Plan:  Relevant Hx: Course: Daily Update: Today's Plan: CONTROLLED ON MEDS  . Benign prostatic hyperplasia with urinary obstruction  03/31/2013  . Chronic back pain   . 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 Cirrhosis ([place])   . DDD (degenerative disc disease), cervical 11/06/2014  . DDD (degenerative disc disease), lumbar 11/06/2014  . 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   Overview:  severe in feet   . 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 Erythrocytosis   . Esophagitis, reflux 12/07/2014  . GERD (gastroesophageal reflux disease)   . HCV (hepatitis C virus) 12/07/2014  . Hepatitis C    DID HARVONI TREATMENT/ NO LONGER HEP C POSITIVE  . Idiopathic scoliosis and kyphoscoliosis 03/01/2013  . Neuropathy of both feet   . Painful legs and moving toes    DUE TO BACK PROBLEMS  . Prostatitis   . Sacroiliac joint dysfunction 11/06/2014  . Spinal stenosis 12/07/2014  . Testicular dysfunction 11/04/2011   Overview:  Dr. Jacqlyn Larsen is his urologist.     PAST SURGICAL HISTORY :   Past Surgical History:  Procedure Laterality Date  . BACK SURGERY  04/14/13   L2-S1  . 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 CATARACT EXTRACTION Left 1989  . CERVICAL SPINE SURGERY      X2 FUSION C4-7  . CHOLECYSTECTOMY  07/2013  . COLONOSCOPY WITH PROPOFOL N/A 12/18/2014   Procedure: COLONOSCOPY WITH PROPOFOL;  Surgeon: Lucilla Lame, MD;  Location: [facility];  Service: Endoscopy;  Laterality: N/A;  . ELBOW ARTHROSCOPY Right 02/2012  . ESOPHAGOGASTRODUODENOSCOPY (EGD) WITH PROPOFOL N/A 12/18/2014   Procedure: ESOPHAGOGASTRODUODENOSCOPY (EGD) WITH PROPOFOL with dialtion;  Surgeon: Lucilla Lame, MD;  Location: [facility];  Service: Endoscopy;  Laterality: N/A;  . [place]  CUT NOSE AND EYE, MULTIPLE SURGERIES  . FINGER SURGERY Left 2015   THUMB  . FINGER SURGERY Right 1966   MIDDLE  . FOOT SURGERY Right 1984  . FUNDIPLICATION  4742  . HERNIA REPAIR Left 1981/1957   RIGHT SIDE IN [phone]  . MASS EXCISION Left 02/16/2015   Procedure: LEFT BURSA ELBOW  EXCISION;  Surgeon: Leanor Kail, MD;  Location: [facility];  Service: Orthopedics;  Laterality: Left;  . NISSEN FUNDOPLICATION  3875  . NOSE SURGERY  12/01/02   SEPTUM RECONSTRUCTION  . POLYPECTOMY  12/18/2014   Procedure: POLYPECTOMY;  Surgeon: Lucilla Lame, MD;  Location: [facility];  Service: Endoscopy;;  . SEPTOPLASTY  1999  . SHOULDER SURGERY Right 1983  . SHOULDER SURGERY Left 1979   WITH ELBOW (TENDON RELEASE)  . SKIN SURGERY  2004   SKIN CANCER NOSE    FAMILY HISTORY :   Family History  Problem Relation Age of Onset  . Arthritis Mother   . Asthma Mother   . Cancer Mother   . Heart disease Father     SOCIAL HISTORY:   Social History   Tobacco Use  . Smoking status: Never Smoker  . Smokeless tobacco: Never Used  Substance Use Topics  . Alcohol use: Yes    Alcohol/week: 1.0 standard drinks    Types: 1 Glasses of wine per week  . Drug use: No    ALLERGIES:  is allergic to no known allergies.  MEDICATIONS:  Current Outpatient Medications  Medication Sig Dispense Refill  . amLODipine (NORVASC) 10 MG tablet Take 1 tablet by mouth daily.    . citalopram (CELEXA) 20 MG tablet Take 20 mg by mouth daily.    . clonazePAM (KLONOPIN) 0.5 MG tablet Take 1 tablet by mouth daily as needed for anxiety.     . furosemide (LASIX) 40 MG tablet Take 40 mg by mouth as needed. prn    . loratadine (CLARITIN) 10 MG tablet Take 10 mg by mouth daily. AM    . meloxicam (MOBIC) 7.5 MG tablet Take 1 tablet by mouth 2 (two) times daily.    [name] Kitchen NEEDLE, DISP, 23 G (BD DISP NEEDLE) 23G X 1" MISC Use 1 Units every 10 (ten) days.    . sildenafil (REVATIO) 20 MG tablet Take by mouth.    . spironolactone (ALDACTONE) 25 MG tablet Take 25 mg by mouth daily. AM    . tamsulosin (FLOMAX) 0.4 MG CAPS capsule Take 1 capsule (0.4 mg total) by mouth 2 (two) times daily. 60 capsule 1  . Testosterone Cypionate 200 MG/ML SOLN INJECT 0.6 ML IN THE MUSCLE ONCE A WEEK    . traZODone (DESYREL)  50 MG tablet Take 1 tablet by mouth at bedtime.     No current facility-administered medications for this visit.     PHYSICAL EXAMINATION:   BP 129/82   Pulse 61   Temp 97.6 F (36.4 C) (Tympanic)   Resp 20   There were no vitals filed for this visit.  Physical Exam  Constitutional: He is oriented to person, place, and time and well-developed, well-nourished, and in no distress.  HENT:  Head: Normocephalic and atraumatic.  Mouth/Throat: Oropharynx is clear and moist. No oropharyngeal exudate.  Eyes: Pupils are equal, round, and reactive to light.  Neck: Normal range of motion. Neck supple.  Cardiovascular: Normal rate and regular rhythm.  Pulmonary/Chest: No respiratory distress. He has no wheezes.  Abdominal: Soft. Bowel sounds are normal. He exhibits no distension  and no mass. There is no abdominal tenderness. There is no rebound and no guarding.  Musculoskeletal: Normal range of motion.        General: No tenderness or edema.  Neurological: He is alert and oriented to person, place, and time.  Skin: Skin is warm.  Erythematous rash noted on the face/skin ointment for precancerous skin lesions.  Psychiatric: Affect normal.    LABORATORY DATA:  I have reviewed the data as listed    Component Value Date/Time   NA 137 04/18/2016 0842   NA 137 02/22/2012 0645   K 3.4 (L) 04/18/2016 0842   K 4.6 02/22/2012 0645   CL 99 (L) 04/18/2016 0842   CL 103 02/22/2012 0645   CO2 31 04/18/2016 0842   CO2 30 02/22/2012 0645   GLUCOSE 103 (H) 04/18/2016 0842   GLUCOSE 90 02/22/2012 0645   BUN 18 04/18/2016 0842   BUN 9 02/22/2012 0645   CREATININE 0.98 04/18/2016 0842   CREATININE 0.97 02/22/2012 0645   CALCIUM 8.7 (L) 04/18/2016 0842   CALCIUM 8.5 02/22/2012 0645   PROT 7.5 04/18/2016 0842   PROT 6.9 02/22/2012 0645   ALBUMIN 4.0 04/18/2016 0842   ALBUMIN 3.1 (L) 02/22/2012 0645   AST 85 (H) 04/18/2016 0842   AST 80 (H) 02/22/2012 0645   ALT 54 04/18/2016 0842   ALT 96  (H) 02/22/2012 0645   ALKPHOS 58 04/18/2016 0842   ALKPHOS 47 (L) 02/22/2012 0645   BILITOT 0.8 04/18/2016 0842   BILITOT 0.3 02/22/2012 0645   GFRNONAA >60 04/18/2016 0842   GFRNONAA >60 02/22/2012 0645   GFRAA >60 04/18/2016 0842   GFRAA >60 02/22/2012 0645    No results found for: SPEP, UPEP  Lab Results  Component Value Date   WBC 5.5 04/18/2016   NEUTROABS 2.8 04/18/2016   HGB 16.2 08/11/2018   HCT 47.3 08/11/2018   MCV 93.1 04/18/2016   PLT 176 04/18/2016      Chemistry      Component Value Date/Time   NA 137 04/18/2016 0842   NA 137 02/22/2012 0645   K 3.4 (L) 04/18/2016 0842   K 4.6 02/22/2012 0645   CL 99 (L) 04/18/2016 0842   CL 103 02/22/2012 0645   CO2 31 04/18/2016 0842   CO2 30 02/22/2012 0645   BUN 18 04/18/2016 0842   BUN 9 02/22/2012 0645   CREATININE 0.98 04/18/2016 0842   CREATININE 0.97 02/22/2012 0645      Component Value Date/Time   CALCIUM 8.7 (L) 04/18/2016 0842   CALCIUM 8.5 02/22/2012 0645   ALKPHOS 58 04/18/2016 0842   ALKPHOS 47 (L) 02/22/2012 0645   AST 85 (H) 04/18/2016 0842   AST 80 (H) 02/22/2012 0645   ALT 54 04/18/2016 0842   ALT 96 (H) 02/22/2012 0645   BILITOT 0.8 04/18/2016 0842   BILITOT 0.3 02/22/2012 0645        ASSESSMENT & PLAN:   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 Patient denies any improvement of his symptoms post phlebotomy. HOLD phlebotomy today.   # Back pain-s/p surgery improved. STABLE.   # Hypogonadism- on testoterone- defer to Urology/Dr.Stoiff.   # Skin rash- pre-cancerous ointment/ as per [facility].   # DISPOSITION: # HOLD Phlebotomy #  H&H in  6 months/possible phlebotomy; # Follow up  In 23 month with MD- H&H-Dr.B     Cammie Sickle,  MD 08/11/2018 7:37 PM

## 2018-08-11 NOTE — Assessment & Plan Note (Addendum)
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 Patient denies any improvement of his symptoms post phlebotomy. HOLD phlebotomy today.   # Back pain-s/p surgery improved. STABLE.   # Hypogonadism- on testoterone- defer to Urology/Dr.Stoiff.   # Skin rash- pre-cancerous ointment/ as per [facility].   # DISPOSITION: # HOLD Phlebotomy #  H&H in  6 months/possible phlebotomy; # Follow up  In 6 month with MD- H&H-Dr.B

## 2018-09-09 ENCOUNTER — Ambulatory Visit: Payer: Medicare Other

## 2018-09-09 ENCOUNTER — Telehealth: Payer: Self-pay | Admitting: *Deleted

## 2018-09-09 ENCOUNTER — Other Ambulatory Visit: Payer: Self-pay | Admitting: Orthopedic Surgery

## 2018-09-09 ENCOUNTER — Other Ambulatory Visit: Payer: Self-pay

## 2018-09-09 DIAGNOSIS — M25562 Pain in left knee: Secondary | ICD-10-CM

## 2018-09-09 DIAGNOSIS — N401 Enlarged prostate with lower urinary tract symptoms: Principal | ICD-10-CM

## 2018-09-09 DIAGNOSIS — G8929 Other chronic pain: Secondary | ICD-10-CM

## 2018-09-09 DIAGNOSIS — M25362 Other instability, left knee: Secondary | ICD-10-CM

## 2018-09-09 DIAGNOSIS — N138 Other obstructive and reflux uropathy: Secondary | ICD-10-CM

## 2018-09-09 LAB — BLADDER SCAN AMB NON-IMAGING

## 2018-09-09 NOTE — Patient Instructions (Addendum)
Patient was informed will forward results to Dr. Bernardo Heater and will be in touch.

## 2018-09-09 NOTE — Telephone Encounter (Signed)
-----   Message from Abbie Sons, MD sent at 09/09/2018 11:43 AM EDT ----- PVR better at 137 mL.  Keep scheduled July appointment.

## 2018-09-09 NOTE — Telephone Encounter (Signed)
Left message on VM-asked to return call with any questions.

## 2018-09-09 NOTE — Progress Notes (Signed)
Bladder Scan Patient can void: 137 ml Performed By: Blondell Reveal

## 2018-09-10 ENCOUNTER — Ambulatory Visit: Payer: Medicare Other

## 2018-09-10 ENCOUNTER — Other Ambulatory Visit: Payer: Self-pay

## 2018-09-10 DIAGNOSIS — N138 Other obstructive and reflux uropathy: Secondary | ICD-10-CM

## 2018-09-10 DIAGNOSIS — N401 Enlarged prostate with lower urinary tract symptoms: Principal | ICD-10-CM

## 2018-09-10 MED ORDER — TAMSULOSIN HCL 0.4 MG PO CAPS: 0.4000 mg | ORAL_CAPSULE | Freq: Two times a day (BID) | ORAL | 2 refills | Status: DC

## 2018-10-22 ENCOUNTER — Other Ambulatory Visit: Payer: Self-pay

## 2018-10-22 ENCOUNTER — Ambulatory Visit
Admission: RE | Admit: 2018-10-22 | Discharge: 2018-10-22 | Disposition: A | Discharge status: Home / Self Care | Payer: Medicare Other | Source: Ambulatory Visit | Attending: Orthopedic Surgery | Admitting: Orthopedic Surgery

## 2018-10-22 DIAGNOSIS — M25562 Pain in left knee: Secondary | ICD-10-CM | POA: Insufficient documentation

## 2018-10-22 DIAGNOSIS — M25362 Other instability, left knee: Secondary | ICD-10-CM | POA: Diagnosis present

## 2018-10-22 DIAGNOSIS — G8929 Other chronic pain: Secondary | ICD-10-CM | POA: Diagnosis present

## 2018-12-07 ENCOUNTER — Telehealth: Payer: Self-pay | Admitting: Urology

## 2018-12-07 NOTE — Telephone Encounter (Signed)
Pt. Is having trouble getting Flomax refilled and the pharmacy is telling him the insurance will not pay because the prescription was changed from once a day to twice a day. Please advise pt.

## 2018-12-08 NOTE — Telephone Encounter (Signed)
Left pt mess to call 

## 2018-12-13 NOTE — Telephone Encounter (Signed)
Pt LMOM returning call 

## 2018-12-13 NOTE — Telephone Encounter (Signed)
Left pt mess

## 2018-12-14 NOTE — Telephone Encounter (Signed)
Pt returned call, and wants to know if Flomax can be increased. Pt states he is having difficulty urinating also. Please advise.

## 2018-12-15 NOTE — Telephone Encounter (Signed)
Spoke with patient and clarified that he is to take two tablets of Floax daily script was sent in 09-2018 if prior auth is needed his pharmacy will need to send a faxed form. Patient verbalized understanding and states he will confirm with his pharamcy

## 2019-01-03 ENCOUNTER — Other Ambulatory Visit: Payer: Self-pay | Admitting: Family Medicine

## 2019-01-03 DIAGNOSIS — E291 Testicular hypofunction: Secondary | ICD-10-CM

## 2019-01-04 ENCOUNTER — Other Ambulatory Visit: Payer: Medicare Other

## 2019-01-04 ENCOUNTER — Other Ambulatory Visit: Payer: Self-pay

## 2019-01-04 DIAGNOSIS — E291 Testicular hypofunction: Secondary | ICD-10-CM

## 2019-01-05 LAB — TESTOSTERONE: Testosterone: 732 ng/dL (ref 264–916)

## 2019-01-06 ENCOUNTER — Ambulatory Visit (INDEPENDENT_AMBULATORY_CARE_PROVIDER_SITE_OTHER): Payer: Medicare Other | Admitting: Urology

## 2019-01-06 ENCOUNTER — Encounter: Payer: Self-pay | Admitting: Urology

## 2019-01-06 ENCOUNTER — Other Ambulatory Visit: Payer: Self-pay

## 2019-01-06 VITALS — BP 153/95 | HR 73 | Ht 69.0 in | Wt 231.0 lb

## 2019-01-06 DIAGNOSIS — N401 Enlarged prostate with lower urinary tract symptoms: Secondary | ICD-10-CM | POA: Diagnosis not present

## 2019-01-06 DIAGNOSIS — E291 Testicular hypofunction: Secondary | ICD-10-CM | POA: Diagnosis not present

## 2019-01-06 NOTE — Progress Notes (Signed)
01/06/2019 1:47 PM   [name] 1950/06/15 [id number]  Referring provider: Sofie Hartigan, MD [place],  [place] [address]  Chief Complaint  Patient presents with  . Hypogonadism   Urologic history: 1.  Hypogonadism  -Testosterone cypionate 0.6 cc weekly  -Secondary erythrocytosis followed by hematology  2.  BPH with lower urinary tract symptoms  -Tamsulosin 0.8 mg daily   HPI: 68 y.o. male presents for semiannual follow-up.  He has decreased his dose to 100 mg every 7 to 10 days.  A testosterone level drawn on 01/04/2019 was 742.  A PSA was not drawn.  His hematocrit drawn by hematology and March 2020 was 47.3.  He has stable lower urinary tract symptoms.  Denies dysuria or gross hematuria.  Denies flank, abdominal, pelvic or scrotal pain.  PMH: Past Medical History:  Diagnosis Date  . Arthritis   . Back pain    MULTIPLE BACK SURGERIES  . Benign essential HTN 12/07/2014   Last Assessment & Plan:  Relevant Hx: Course: Daily Update: Today's Plan: CONTROLLED ON MEDS  . 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/31/2013  . Chronic back pain   . 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 Cirrhosis ([place])   . DDD (degenerative disc disease), cervical 11/06/2014  . DDD (degenerative disc disease), lumbar 11/06/2014  . 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   Overview:  severe in feet   . 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 Erythrocytosis   . Esophagitis, reflux 12/07/2014  . GERD (gastroesophageal reflux disease)   . HCV (hepatitis C virus) 12/07/2014  . Hepatitis C    DID HARVONI TREATMENT/ NO LONGER HEP C POSITIVE  . Idiopathic scoliosis and kyphoscoliosis 03/01/2013  . Neuropathy of both feet   . Painful legs and moving toes    DUE TO BACK PROBLEMS  . Prostatitis   . Sacroiliac joint dysfunction 11/06/2014  . Spinal stenosis 12/07/2014  . Testicular dysfunction 11/04/2011   Overview:  Dr. Jacqlyn Larsen is his urologist.     Surgical History: Past Surgical History:  Procedure Laterality Date  . BACK SURGERY  04/14/13   L2-S1  . 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 CATARACT EXTRACTION Left 1989  . CERVICAL SPINE SURGERY      X2 FUSION C4-7  . CHOLECYSTECTOMY  07/2013  . COLONOSCOPY WITH PROPOFOL N/A 12/18/2014   Procedure: COLONOSCOPY WITH PROPOFOL;  Surgeon: Lucilla Lame, MD;  Location: [facility];  Service: Endoscopy;  Laterality: N/A;  . ELBOW ARTHROSCOPY Right 02/2012  . ESOPHAGOGASTRODUODENOSCOPY (EGD) WITH PROPOFOL N/A 12/18/2014   Procedure: ESOPHAGOGASTRODUODENOSCOPY (EGD) WITH PROPOFOL with dialtion;  Surgeon: Lucilla Lame, MD;  Location: [facility];  Service: Endoscopy;  Laterality: N/A;  . FACIAL RECONSTRUCTION SURGERY  1970   BOTTLE CUT NOSE AND EYE, MULTIPLE SURGERIES  . FINGER SURGERY Left 2015   THUMB  . FINGER SURGERY Right 1966   MIDDLE  . FOOT SURGERY Right 1984  . FUNDIPLICATION  0240  . HERNIA REPAIR Left 1981/1957   RIGHT SIDE IN [phone]  . MASS EXCISION Left 02/16/2015   Procedure: LEFT BURSA ELBOW EXCISION;  Surgeon: Leanor Kail, MD;  Location: [facility];  Service: Orthopedics;  Laterality: Left;  . NISSEN FUNDOPLICATION  3299  . NOSE SURGERY  12/01/02   SEPTUM RECONSTRUCTION  . POLYPECTOMY  12/18/2014   Procedure: POLYPECTOMY;  Surgeon: Lucilla Lame, MD;  Location: [facility];  Service: Endoscopy;;  . SEPTOPLASTY  1999  . SHOULDER SURGERY Right 1983  . SHOULDER SURGERY Left 1979   WITH ELBOW (TENDON RELEASE)  . SKIN SURGERY  2004   SKIN CANCER NOSE    Home Medications:  Allergies as of 01/06/2019      Reactions   No Known Allergies       Medication List       Accurate as of January 06, 2019  1:47 PM. If you have any questions, ask your nurse or doctor.        amLODipine 10 MG tablet Commonly known as: NORVASC Take 1 tablet by mouth daily.   BD Disp Needle 23G X 1" Misc Generic drug: NEEDLE  (DISP) 23 G Use 1 Units every 10 (ten) days.   citalopram 20 MG tablet Commonly known as: CELEXA Take 20 mg by mouth daily.   clonazePAM 0.5 MG tablet Commonly known as: KLONOPIN Take 1 tablet by mouth daily as needed for anxiety.   furosemide 40 MG tablet Commonly known as: LASIX Take 40 mg by mouth as needed. prn   loratadine 10 MG tablet Commonly known as: CLARITIN Take 10 mg by mouth daily. AM   meloxicam 7.5 MG tablet Commonly known as: MOBIC Take 1 tablet by mouth 2 (two) times daily.   sildenafil 20 MG tablet Commonly known as: REVATIO Take by mouth.   spironolactone 25 MG tablet Commonly known as: ALDACTONE Take 25 mg by mouth daily. AM   tamsulosin 0.4 MG Caps capsule Commonly known as: FLOMAX Take 1 capsule (0.4 mg total) by mouth 2 (two) times daily.   Testosterone Cypionate 200 MG/ML Soln INJECT 0.6 ML IN THE MUSCLE ONCE A WEEK   traZODone 50 MG tablet Commonly known as: DESYREL Take 1 tablet by mouth at bedtime.       Allergies:  Allergies  Allergen Reactions  . No Known Allergies     Family History: Family History  Problem Relation Age of Onset  . Arthritis Mother   . Asthma Mother   . Cancer Mother   . Heart disease Father     Social History:  reports that he has never smoked. He has never used smokeless tobacco. He reports current alcohol use of about 1.0 standard drinks of alcohol per week. He reports that he does not use drugs.  ROS: UROLOGY Frequent Urination?: Yes Hard to postpone urination?: Yes Burning/pain with urination?: No Get up at night to urinate?: Yes Leakage of urine?: Yes Urine stream starts and stops?: No Trouble starting stream?: No Do you have to strain to urinate?: No Blood in urine?: No Urinary tract infection?: No Sexually transmitted disease?: No Injury to kidneys or bladder?: No Painful intercourse?: No Weak stream?: No Erection problems?: No Penile pain?: No  Gastrointestinal Nausea?: No  Vomiting?: No Indigestion/heartburn?: No Diarrhea?: No Constipation?: No  Constitutional Fever: No Night sweats?: No Weight loss?: No Fatigue?: No  Skin Skin rash/lesions?: No Itching?: No  Eyes Blurred vision?: No Double vision?: No  Ears/Nose/Throat Sore throat?: No Sinus problems?: Yes  Hematologic/Lymphatic Swollen glands?: No Easy bruising?: No  Cardiovascular Leg swelling?: No Chest pain?: No  Respiratory Cough?: No Shortness of breath?: No  Endocrine Excessive thirst?: No  Musculoskeletal Back pain?: Yes Joint pain?: Yes  Neurological Headaches?: No Dizziness?: No  Psychologic Depression?: Yes Anxiety?: Yes  Physical Exam: BP (!) 153/95 (BP Location: Left Arm, Patient Position: Sitting, Cuff Size: Normal)   Pulse 73   Ht 5\' 9"  (1.753 m)  Wt 231 lb (104.8 kg)   BMI 34.11 kg/m   Constitutional:  Alert and oriented, No acute distress. HEENT: [place] AT, moist mucus membranes.  Trachea midline, no masses. Cardiovascular: No clubbing, cyanosis, or edema. Respiratory: Normal respiratory effort, no increased work of breathing. GU: No CVA tenderness.  Prostate 35-40 g, smooth without nodules. Skin: No rashes, bruises or suspicious lesions. Neurologic: Grossly intact, no focal deficits, moving all 4 extremities. Psychiatric: Normal mood and affect.   Assessment & Plan:    - Hypogonadism Doing well on TRT.  He did not need a refill.  We will see if his PSA can be run off the testosterone level drawn earlier this week.  If not he is scheduled to see Dr. Ellison Hughs next month and will have his PSA drawn there.  Recommend 72-month follow-up for testosterone in 1 year for testosterone, PSA and DRE.  His hematocrit will continue to be checked by hematology.  - BPH with lower urinary tract symptoms Stable voiding symptoms on tamsulosin 0.8 mg daily.  He did not need a refill.   Abbie Sons, [employer] [address], [place], [place] [address] [phone]

## 2019-01-10 ENCOUNTER — Telehealth: Payer: Self-pay | Admitting: *Deleted

## 2019-01-10 NOTE — Telephone Encounter (Addendum)
Left message with results   ----- Message from Abbie Sons, MD sent at 01/09/2019 12:12 PM EDT ----- PSA stable at 1.1

## 2019-01-27 LAB — SPECIMEN STATUS REPORT

## 2019-01-27 LAB — PSA: Prostate Specific Ag, Serum: 1.1 ng/mL (ref 0.0–4.0)

## 2019-02-01 ENCOUNTER — Other Ambulatory Visit: Payer: Self-pay

## 2019-02-01 DIAGNOSIS — N138 Other obstructive and reflux uropathy: Secondary | ICD-10-CM

## 2019-02-01 DIAGNOSIS — E291 Testicular hypofunction: Secondary | ICD-10-CM

## 2019-02-01 DIAGNOSIS — N401 Enlarged prostate with lower urinary tract symptoms: Secondary | ICD-10-CM

## 2019-02-01 MED ORDER — TAMSULOSIN HCL 0.4 MG PO CAPS: 0.4000 mg | ORAL_CAPSULE | Freq: Two times a day (BID) | ORAL | 3 refills | Status: DC

## 2019-02-04 NOTE — Telephone Encounter (Signed)
[employer] called to check on the refill/PA for this patient   Thanks, Sharyn Lull

## 2019-02-07 MED ORDER — TESTOSTERONE CYPIONATE 200 MG/ML IJ SOLN: 0.6000 mL | INTRAMUSCULAR | 0 refills | Status: DC

## 2019-02-09 ENCOUNTER — Other Ambulatory Visit: Payer: Self-pay

## 2019-02-09 ENCOUNTER — Inpatient Hospital Stay: Payer: Medicare Other | Attending: Internal Medicine

## 2019-02-09 ENCOUNTER — Inpatient Hospital Stay: Payer: Medicare Other

## 2019-02-09 DIAGNOSIS — D751 Secondary polycythemia: Secondary | ICD-10-CM

## 2019-02-09 LAB — HEMOGLOBIN: Hemoglobin: 17.4 g/dL — ABNORMAL HIGH (ref 13.0–17.0)

## 2019-02-09 LAB — HEMATOCRIT: HCT: 49.3 % (ref 39.0–52.0)

## 2019-06-10 HISTORY — PX: JOINT REPLACEMENT: SHX530

## 2019-07-07 ENCOUNTER — Other Ambulatory Visit: Payer: Self-pay

## 2019-07-07 DIAGNOSIS — E291 Testicular hypofunction: Secondary | ICD-10-CM

## 2019-07-07 NOTE — Progress Notes (Signed)
TEST!!!

## 2019-07-08 ENCOUNTER — Other Ambulatory Visit: Payer: Self-pay

## 2019-07-08 ENCOUNTER — Other Ambulatory Visit: Payer: Medicare Other

## 2019-07-08 DIAGNOSIS — E291 Testicular hypofunction: Secondary | ICD-10-CM

## 2019-07-09 LAB — TESTOSTERONE: Testosterone: 517 ng/dL (ref 264–916)

## 2019-07-11 ENCOUNTER — Telehealth: Payer: Self-pay | Admitting: *Deleted

## 2019-07-11 NOTE — Telephone Encounter (Signed)
-----   Message from Abbie Sons, MD sent at 07/10/2019  1:29 PM EST ----- Testosterone level looks good at 517.  Follow-up as scheduled

## 2019-08-02 ENCOUNTER — Telehealth: Payer: Self-pay | Admitting: Urology

## 2019-08-02 NOTE — Telephone Encounter (Signed)
Patient called the office and left a voice mail message on the clinical triage line.  He says that his prescription refill for testosterone was denied.  He does not understand why or what the next steps should be.   Please call him to discuss.

## 2019-08-02 NOTE — Telephone Encounter (Signed)
Advised patient to ask  pharmacy to send another request

## 2019-08-03 ENCOUNTER — Other Ambulatory Visit: Payer: Self-pay | Admitting: *Deleted

## 2019-08-03 DIAGNOSIS — E291 Testicular hypofunction: Secondary | ICD-10-CM

## 2019-08-07 MED ORDER — TESTOSTERONE CYPIONATE 200 MG/ML IJ SOLN: 0.6000 mL | INTRAMUSCULAR | 0 refills | Status: DC

## 2019-08-09 ENCOUNTER — Other Ambulatory Visit: Payer: Self-pay | Admitting: *Deleted

## 2019-08-09 DIAGNOSIS — D751 Secondary polycythemia: Secondary | ICD-10-CM

## 2019-08-10 ENCOUNTER — Inpatient Hospital Stay: Payer: Medicare Other | Attending: Internal Medicine

## 2019-08-10 ENCOUNTER — Inpatient Hospital Stay (HOSPITAL_BASED_OUTPATIENT_CLINIC_OR_DEPARTMENT_OTHER): Payer: Medicare Other | Admitting: Internal Medicine

## 2019-08-10 ENCOUNTER — Other Ambulatory Visit: Payer: Self-pay

## 2019-08-10 ENCOUNTER — Other Ambulatory Visit: Payer: Self-pay | Admitting: *Deleted

## 2019-08-10 ENCOUNTER — Encounter: Payer: Self-pay | Admitting: Internal Medicine

## 2019-08-10 VITALS — BP 130/90 | HR 71 | Resp 18

## 2019-08-10 DIAGNOSIS — D751 Secondary polycythemia: Secondary | ICD-10-CM

## 2019-08-10 DIAGNOSIS — Z85828 Personal history of other malignant neoplasm of skin: Secondary | ICD-10-CM | POA: Insufficient documentation

## 2019-08-10 DIAGNOSIS — Z809 Family history of malignant neoplasm, unspecified: Secondary | ICD-10-CM | POA: Insufficient documentation

## 2019-08-10 DIAGNOSIS — M549 Dorsalgia, unspecified: Secondary | ICD-10-CM | POA: Diagnosis not present

## 2019-08-10 DIAGNOSIS — E291 Testicular hypofunction: Secondary | ICD-10-CM | POA: Diagnosis not present

## 2019-08-10 DIAGNOSIS — G8929 Other chronic pain: Secondary | ICD-10-CM | POA: Insufficient documentation

## 2019-08-10 LAB — HEMATOCRIT: HCT: 52.2 % — ABNORMAL HIGH (ref 39.0–52.0)

## 2019-08-10 LAB — HEMOGLOBIN: Hemoglobin: 17.8 g/dL — ABNORMAL HIGH (ref 13.0–17.0)

## 2019-08-10 NOTE — Assessment & Plan Note (Signed)
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 Patient denies any improvement of his symptoms post phlebotomy. Proceed with phlebotomy today.   # Back pain-s/p surgery improved. Stable.   # Hypogonadism- on testoterone- stable.   # DISPOSITION: # proceed with Phlebotomy #  H&H in  6 months/possible phlebotomy; # Follow up  In 37 month with MD- H&H-Dr.B

## 2019-08-10 NOTE — Progress Notes (Signed)
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   Patient denies any worsening fatigue.  No worsening shortness of breath or cough.  Chronic back pain not any worse.   Review of Systems  Constitutional: Negative for chills, diaphoresis, fever, malaise/fatigue and weight loss.  HENT: Negative for nosebleeds and sore throat.   Eyes: Negative for double vision.  Respiratory: Negative for cough, hemoptysis, sputum production, shortness of breath and wheezing.   Cardiovascular: Negative for chest pain, palpitations, orthopnea and leg swelling.  Gastrointestinal: Negative for abdominal pain, blood in stool, constipation, diarrhea, heartburn, melena, nausea and vomiting.  Genitourinary: Negative for dysuria, frequency and urgency.  Musculoskeletal: Positive for back pain and joint pain.  Skin: Negative for itching.  Neurological: Negative for dizziness, tingling, focal weakness, weakness and headaches.  Endo/Heme/Allergies: Does not bruise/bleed easily.  Psychiatric/Behavioral: Negative for depression. The patient is not nervous/anxious and does not have insomnia.      PAST MEDICAL HISTORY :  Past Medical History:  Diagnosis Date  . Arthritis   . Back pain    MULTIPLE BACK SURGERIES  . Benign essential HTN 12/07/2014   Last Assessment & Plan:  Relevant Hx: Course: Daily Update: Today's Plan: CONTROLLED ON MEDS  . 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/31/2013  . Chronic back pain   . 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  Cirrhosis ([place])   . DDD (degenerative disc disease), cervical 11/06/2014  . DDD (degenerative disc disease), lumbar 11/06/2014  . 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   Overview:  severe in feet   . 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 Erythrocytosis   . Esophagitis, reflux 12/07/2014  . GERD (gastroesophageal reflux disease)   . HCV (hepatitis C virus) 12/07/2014  . Hepatitis C    DID HARVONI TREATMENT/ NO LONGER HEP C POSITIVE  . Idiopathic scoliosis and kyphoscoliosis 03/01/2013  . Neuropathy of both feet   . Painful legs and moving toes    DUE TO BACK PROBLEMS  . Prostatitis   . Sacroiliac joint dysfunction 11/06/2014  . Spinal stenosis 12/07/2014  . Testicular dysfunction 11/04/2011   Overview:  Dr. Jacqlyn Larsen is his urologist.     PAST SURGICAL HISTORY :   Past Surgical History:  Procedure Laterality Date  . BACK SURGERY  04/14/13   L2-S1  . 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 CATARACT EXTRACTION Left 1989  . CERVICAL SPINE SURGERY      X2 FUSION C4-7  . CHOLECYSTECTOMY  07/2013  . COLONOSCOPY WITH PROPOFOL N/A 12/18/2014   Procedure: COLONOSCOPY WITH PROPOFOL;  Surgeon: Lucilla Lame, MD;  Location: [facility];  Service: Endoscopy;  Laterality: N/A;  . ELBOW ARTHROSCOPY Right 02/2012  . ESOPHAGOGASTRODUODENOSCOPY (EGD) WITH PROPOFOL N/A 12/18/2014   Procedure: ESOPHAGOGASTRODUODENOSCOPY (EGD) WITH PROPOFOL with dialtion;  Surgeon: Lucilla Lame, MD;  Location: [facility];  Service: Endoscopy;  Laterality: N/A;  . FACIAL RECONSTRUCTION SURGERY  1970   BOTTLE CUT NOSE AND EYE, MULTIPLE SURGERIES  . FINGER SURGERY Left 2015   THUMB  .  FINGER SURGERY Right 1966   MIDDLE  . FOOT SURGERY Right 1984  . FUNDIPLICATION  99991111  . HERNIA REPAIR Left 1981/1957   RIGHT SIDE IN [phone]  . MASS EXCISION Left 02/16/2015   Procedure: LEFT BURSA ELBOW EXCISION;  Surgeon: Leanor Kail, MD;  Location: [facility];  Service: Orthopedics;   Laterality: Left;  . NISSEN FUNDOPLICATION  99991111  . NOSE SURGERY  12/01/02   SEPTUM RECONSTRUCTION  . POLYPECTOMY  12/18/2014   Procedure: POLYPECTOMY;  Surgeon: Lucilla Lame, MD;  Location: [facility];  Service: Endoscopy;;  . SEPTOPLASTY  1999  . SHOULDER SURGERY Right 1983  . SHOULDER SURGERY Left 1979   WITH ELBOW (TENDON RELEASE)  . SKIN SURGERY  2004   SKIN CANCER NOSE    FAMILY HISTORY :   Family History  Problem Relation Age of Onset  . Arthritis Mother   . Asthma Mother   . Cancer Mother   . Heart disease Father     SOCIAL HISTORY:   Social History   Tobacco Use  . Smoking status: Never Smoker  . Smokeless tobacco: Never Used  Substance Use Topics  . Alcohol use: Yes    Alcohol/week: 1.0 standard drinks    Types: 1 Glasses of wine per week  . Drug use: No    ALLERGIES:  is allergic to no known allergies.  MEDICATIONS:  Current Outpatient Medications  Medication Sig Dispense Refill  . amLODipine (NORVASC) 10 MG tablet Take 1 tablet by mouth daily.    . citalopram (CELEXA) 20 MG tablet Take 20 mg by mouth daily.    . clonazePAM (KLONOPIN) 0.5 MG tablet Take 1 tablet by mouth daily as needed for anxiety.     . furosemide (LASIX) 40 MG tablet Take 40 mg by mouth as needed. prn    . loratadine (CLARITIN) 10 MG tablet Take 10 mg by mouth daily. AM    . meloxicam (MOBIC) 7.5 MG tablet Take 1 tablet by mouth 2 (two) times daily.    [name] Kitchen NEEDLE, DISP, 23 G (BD DISP NEEDLE) 23G X 1" MISC Use 1 Units every 10 (ten) days.    [name] Kitchen spironolactone (ALDACTONE) 25 MG tablet Take 25 mg by mouth daily. AM    . tamsulosin (FLOMAX) 0.4 MG CAPS capsule Take 1 capsule (0.4 mg total) by mouth 2 (two) times daily. 180 capsule 3  . Testosterone Cypionate 200 MG/ML SOLN Inject 0.6 mLs into the muscle once a week. 10 mL 0  . traZODone (DESYREL) 50 MG tablet Take 1 tablet by mouth at bedtime.     No current facility-administered medications for this visit.    PHYSICAL  EXAMINATION:   BP 129/90   Pulse 71   Temp 98 F (36.7 C) (Tympanic)   Resp 20   Wt 228 lb 9.6 oz (103.7 kg)   BMI 33.76 kg/m   Filed Weights   08/10/19 1320  Weight: 228 lb 9.6 oz (103.7 kg)    Physical Exam  Constitutional: He is oriented to person, place, and time and well-developed, well-nourished, and in no distress.  HENT:  Head: Normocephalic and atraumatic.  Mouth/Throat: Oropharynx is clear and moist. No oropharyngeal exudate.  Eyes: Pupils are equal, round, and reactive to light.  Cardiovascular: Normal rate and regular rhythm.  Pulmonary/Chest: No respiratory distress. He has no wheezes.  Abdominal: Soft. Bowel sounds are normal. He exhibits no distension and no mass. There is no abdominal tenderness. There is no rebound and no guarding.  Musculoskeletal:        General: No tenderness or edema. Normal range of motion.     Cervical back: Normal range of motion and neck supple.  Neurological: He is alert and oriented to person, place, and time.  Skin: Skin is warm.  Erythematous rash noted on the face/skin ointment for precancerous skin lesions.  Psychiatric: Affect normal.    LABORATORY DATA:  I have reviewed the data as listed    Component Value Date/Time   NA 137 04/18/2016 0842   NA 137 02/22/2012 0645   K 3.4 (L) 04/18/2016 0842   K 4.6 02/22/2012 0645   CL 99 (L) 04/18/2016 0842   CL 103 02/22/2012 0645   CO2 31 04/18/2016 0842   CO2 30 02/22/2012 0645   GLUCOSE 103 (H) 04/18/2016 0842   GLUCOSE 90 02/22/2012 0645   BUN 18 04/18/2016 0842   BUN 9 02/22/2012 0645   CREATININE 0.98 04/18/2016 0842   CREATININE 0.97 02/22/2012 0645   CALCIUM 8.7 (L) 04/18/2016 0842   CALCIUM 8.5 02/22/2012 0645   PROT 7.5 04/18/2016 0842   PROT 6.9 02/22/2012 0645   ALBUMIN 4.0 04/18/2016 0842   ALBUMIN 3.1 (L) 02/22/2012 0645   AST 85 (H) 04/18/2016 0842   AST 80 (H) 02/22/2012 0645   ALT 54 04/18/2016 0842   ALT 96 (H) 02/22/2012 0645   ALKPHOS 58  04/18/2016 0842   ALKPHOS 47 (L) 02/22/2012 0645   BILITOT 0.8 04/18/2016 0842   BILITOT 0.3 02/22/2012 0645   GFRNONAA >60 04/18/2016 0842   GFRNONAA >60 02/22/2012 0645   GFRAA >60 04/18/2016 0842   GFRAA >60 02/22/2012 0645    No results found for: SPEP, UPEP  Lab Results  Component Value Date   WBC 5.5 04/18/2016   NEUTROABS 2.8 04/18/2016   HGB 17.8 (H) 08/10/2019   HCT 52.2 (H) 08/10/2019   MCV 93.1 04/18/2016   PLT 176 04/18/2016      Chemistry      Component Value Date/Time   NA 137 04/18/2016 0842   NA 137 02/22/2012 0645   K 3.4 (L) 04/18/2016 0842   K 4.6 02/22/2012 0645   CL 99 (L) 04/18/2016 0842   CL 103 02/22/2012 0645   CO2 31 04/18/2016 0842   CO2 30 02/22/2012 0645   BUN 18 04/18/2016 0842   BUN 9 02/22/2012 0645   CREATININE 0.98 04/18/2016 0842   CREATININE 0.97 02/22/2012 0645      Component Value Date/Time   CALCIUM 8.7 (L) 04/18/2016 0842   CALCIUM 8.5 02/22/2012 0645   ALKPHOS 58 04/18/2016 0842   ALKPHOS 47 (L) 02/22/2012 0645   AST 85 (H) 04/18/2016 0842   AST 80 (H) 02/22/2012 0645   ALT 54 04/18/2016 0842   ALT 96 (H) 02/22/2012 0645   BILITOT 0.8 04/18/2016 0842   BILITOT 0.3 02/22/2012 0645        ASSESSMENT & PLAN:   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 Patient denies any improvement of his symptoms post phlebotomy. Proceed with phlebotomy today.   # Back pain-s/p surgery improved. Stable.   # Hypogonadism- on testoterone- stable.   # DISPOSITION: # proceed with Phlebotomy #  H&H in  6 months/possible phlebotomy; # Follow up  In 59 month with MD- H&H-Dr.B     Cammie Sickle, MD 08/10/2019 1:50 PM

## 2019-09-13 ENCOUNTER — Other Ambulatory Visit: Payer: Self-pay | Admitting: *Deleted

## 2019-09-13 DIAGNOSIS — N138 Other obstructive and reflux uropathy: Secondary | ICD-10-CM

## 2019-09-13 MED ORDER — TAMSULOSIN HCL 0.4 MG PO CAPS: 0.4000 mg | ORAL_CAPSULE | Freq: Two times a day (BID) | ORAL | 3 refills | Status: DC

## 2019-09-19 DIAGNOSIS — L089 Local infection of the skin and subcutaneous tissue, unspecified: Secondary | ICD-10-CM | POA: Insufficient documentation

## 2019-12-18 DIAGNOSIS — M1711 Unilateral primary osteoarthritis, right knee: Secondary | ICD-10-CM | POA: Insufficient documentation

## 2019-12-18 DIAGNOSIS — M1712 Unilateral primary osteoarthritis, left knee: Secondary | ICD-10-CM | POA: Insufficient documentation

## 2020-01-09 ENCOUNTER — Other Ambulatory Visit: Payer: Self-pay

## 2020-01-09 DIAGNOSIS — N401 Enlarged prostate with lower urinary tract symptoms: Secondary | ICD-10-CM

## 2020-01-09 DIAGNOSIS — E291 Testicular hypofunction: Secondary | ICD-10-CM

## 2020-01-10 ENCOUNTER — Other Ambulatory Visit: Payer: Self-pay

## 2020-01-10 ENCOUNTER — Other Ambulatory Visit: Payer: Medicare Other

## 2020-01-10 DIAGNOSIS — N401 Enlarged prostate with lower urinary tract symptoms: Secondary | ICD-10-CM

## 2020-01-10 DIAGNOSIS — E291 Testicular hypofunction: Secondary | ICD-10-CM

## 2020-01-11 LAB — PSA: Prostate Specific Ag, Serum: 0.9 ng/mL (ref 0.0–4.0)

## 2020-01-11 LAB — TESTOSTERONE: Testosterone: 777 ng/dL (ref 264–916)

## 2020-01-13 ENCOUNTER — Encounter: Payer: Self-pay | Admitting: Urology

## 2020-01-13 ENCOUNTER — Other Ambulatory Visit: Payer: Self-pay

## 2020-01-13 ENCOUNTER — Ambulatory Visit (INDEPENDENT_AMBULATORY_CARE_PROVIDER_SITE_OTHER): Payer: Medicare Other | Admitting: Urology

## 2020-01-13 VITALS — BP 152/80 | HR 60 | Ht 69.0 in | Wt 235.4 lb

## 2020-01-13 DIAGNOSIS — N401 Enlarged prostate with lower urinary tract symptoms: Secondary | ICD-10-CM

## 2020-01-13 DIAGNOSIS — E291 Testicular hypofunction: Secondary | ICD-10-CM

## 2020-01-13 DIAGNOSIS — R339 Retention of urine, unspecified: Secondary | ICD-10-CM

## 2020-01-13 LAB — BLADDER SCAN AMB NON-IMAGING: Scan Result: 132

## 2020-01-13 NOTE — Progress Notes (Signed)
01/13/2020 9:25 AM   [name] 27-Feb-1951 [id number]  Referring provider: Sofie Hartigan, MD [address],  [place] [id number]  Chief Complaint  Patient presents with   Follow-up    Urologic history: 1.  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  BPH with lower urinary tract symptoms             -Tamsulosin 0.8 mg daily  HPI: 69 y.o. male presents for annual follow-up.   Remains on TRT  Worsening storage related LUTS with frequency, urgency and occasional urge incontinence  Denies dysuria, gross hematuria  No flank, abdominal or pelvic pain  No breast tenderness/enlargement  Phlebotomy in hematology 08/2019 held  Labs 01/10/2020: Testosterone 777; PSA 0.9  Scheduled for knee replacement later this month  PMH: Past Medical History:  Diagnosis Date   Arthritis    Back pain    MULTIPLE BACK SURGERIES   Benign essential HTN 12/07/2014   Last Assessment & Plan:  Relevant Hx: Course: Daily Update: Today's Plan: CONTROLLED ON MEDS   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/31/2013   Chronic back pain    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   Cirrhosis ([facility])    DDD (degenerative disc disease), cervical 11/06/2014   DDD (degenerative disc disease), lumbar 11/06/2014   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   Overview:  severe in feet    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 Jacqlyn Larsen is his urologist.     Surgical History: Past Surgical History:  Procedure Laterality Date   BACK SURGERY  04/14/13   L2-S1   Cancer of right ear     3/17   CATARACT EXTRACTION Left 1989   CERVICAL SPINE SURGERY      X2 FUSION C4-7   CHOLECYSTECTOMY  07/2013   COLONOSCOPY WITH PROPOFOL N/A 12/18/2014   Procedure: COLONOSCOPY WITH PROPOFOL;  Surgeon: Lucilla Lame, MD;  Location: [facility];  Service: Endoscopy;  Laterality: N/A;   ELBOW ARTHROSCOPY Right 02/2012   ESOPHAGOGASTRODUODENOSCOPY (EGD) WITH PROPOFOL N/A 12/18/2014   Procedure: ESOPHAGOGASTRODUODENOSCOPY (EGD) WITH PROPOFOL with dialtion;  Surgeon: Lucilla Lame, MD;  Location: [facility];  Service: Endoscopy;  Laterality: N/A;   FACIAL RECONSTRUCTION SURGERY  1970   BOTTLE CUT NOSE AND EYE, MULTIPLE SURGERIES   FINGER SURGERY Left 2015   THUMB   FINGER SURGERY Right 1966   MIDDLE   FOOT SURGERY Right 9371   FUNDIPLICATION  6967   HERNIA REPAIR Left 1981/1957   RIGHT SIDE IN 1957   MASS EXCISION Left 02/16/2015   Procedure: LEFT BURSA ELBOW EXCISION;  Surgeon: Leanor Kail, MD;  Location: [facility];  Service: Orthopedics;  Laterality: Left;   NISSEN FUNDOPLICATION  8938   NOSE SURGERY  12/01/02   SEPTUM RECONSTRUCTION   POLYPECTOMY  12/18/2014   Procedure: POLYPECTOMY;  Surgeon: Lucilla Lame, MD;  Location: [facility];  Service: Endoscopy;;   SEPTOPLASTY  1999   SHOULDER SURGERY Right 1983   SHOULDER SURGERY Left 1979   WITH ELBOW (TENDON RELEASE)   SKIN SURGERY  2004   SKIN CANCER NOSE    Home Medications:  Allergies as of 01/13/2020      Reactions   No Known Allergies       Medication List       Accurate as of January 13, 2020  9:25 AM. If you have any questions, ask your nurse or doctor.        amLODipine 10 MG tablet Commonly known as: NORVASC Take 1 tablet by mouth daily.    BD Disp Needle 23G X 1" Misc Generic drug: NEEDLE (DISP) 23 G Use 1 Units every 10 (ten) days.   citalopram 20 MG tablet Commonly known as: CELEXA Take 20 mg by mouth daily.   clonazePAM 0.5 MG tablet Commonly known as: KLONOPIN Take 1 tablet by mouth daily as needed for anxiety.   furosemide 40 MG tablet Commonly known as: LASIX Take 40 mg by mouth as needed. prn   loratadine 10 MG tablet Commonly known as: CLARITIN Take 10 mg by mouth daily. AM   meloxicam 7.5 MG tablet Commonly known as: MOBIC Take 1 tablet by mouth 2 (two) times daily.   spironolactone 25 MG tablet Commonly known as: ALDACTONE Take 25 mg by mouth daily. AM   tamsulosin 0.4 MG Caps capsule Commonly known as: FLOMAX Take 1 capsule (0.4 mg total) by mouth 2 (two) times daily.   Testosterone Cypionate 200 MG/ML Soln Inject 0.6 mLs into the muscle once a week.   traZODone 50 MG tablet Commonly known as: DESYREL Take 1 tablet by mouth at bedtime.       Allergies:  Allergies  Allergen Reactions   No Known Allergies     Family History: Family History  Problem Relation Age of Onset   Arthritis Mother    Asthma Mother    Cancer Mother    Heart disease Father     Social History:  reports that he has never smoked. He has never used smokeless tobacco. He reports current alcohol use of about 1.0 standard drink of alcohol per week. He reports that he does not use drugs.   Physical Exam: BP (!) 152/80 (BP Location: Left Arm, Patient Position: Sitting, Cuff Size: Normal)    Pulse 60    Ht 5\' 9"  (1.753 m)    Wt 235 lb 6.4 oz (106.8 kg)    BMI 34.76 kg/m   Constitutional:  Alert and oriented, No acute distress. HEENT:  AT, moist mucus membranes.  Trachea midline, no masses. Cardiovascular: No clubbing, cyanosis, or edema. Respiratory: Normal respiratory effort, no increased work of breathing. GU: Prostate 50 g, smooth without nodules Lymph: No cervical or inguinal lymphadenopathy. Skin:  No rashes, bruises or suspicious lesions. Neurologic: Grossly intact, no focal deficits, moving all 4 extremities. Psychiatric: Normal mood and affect.    Assessment & Plan:    1.  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  BPH with LUTS  Worsening storage related voiding symptoms  Discussed surgical management including UroLift along with further medical management  He has initially elected medical management and trial Myrbetriq 50  mg daily-samples given  Bladder scan PVR: Initially 526 and he was asked to double void and follow-up scan was [place], [place] [address] [place], [place] [id number] [phone]

## 2020-01-16 ENCOUNTER — Telehealth: Payer: Self-pay | Admitting: *Deleted

## 2020-01-16 NOTE — Telephone Encounter (Signed)
RN left vm for patient to return my phone call. I reviewed the patient's chart in epic. I do not see any recent H&H drawn.   Dr. Jacinto Reap- please advise.

## 2020-01-16 NOTE — Telephone Encounter (Signed)
Patient returned my phone call. He had a testosterone and psa drawn last week, but n/o H&H drawn. He would like to move his apts up to the week of the 23rd. He is having knee surgery on 8/30. New apts provided to patient on 02/01/2020 at 1:30pm.

## 2020-01-16 NOTE — Telephone Encounter (Signed)
Patient called and spoke with [facility] in scheduling. I received this msg- patient was evaluated by another provider. Per patient, his labs are up was told just needs a phlebotomy. having knee surgery on 8/30. can we bring in for phlebotomy this week

## 2020-01-18 ENCOUNTER — Other Ambulatory Visit: Payer: Self-pay | Admitting: Urology

## 2020-01-18 DIAGNOSIS — E291 Testicular hypofunction: Secondary | ICD-10-CM

## 2020-01-20 MED ORDER — TESTOSTERONE CYPIONATE 200 MG/ML IJ SOLN: 0.6000 mL | INTRAMUSCULAR | 0 refills | Status: DC

## 2020-02-01 ENCOUNTER — Inpatient Hospital Stay: Payer: Medicare Other | Attending: Internal Medicine

## 2020-02-01 ENCOUNTER — Other Ambulatory Visit: Payer: Self-pay

## 2020-02-01 ENCOUNTER — Inpatient Hospital Stay: Payer: Medicare Other

## 2020-02-01 VITALS — BP 134/88 | HR 68 | Temp 98.4°F | Resp 18

## 2020-02-01 DIAGNOSIS — Z79890 Hormone replacement therapy (postmenopausal): Secondary | ICD-10-CM | POA: Insufficient documentation

## 2020-02-01 DIAGNOSIS — D751 Secondary polycythemia: Secondary | ICD-10-CM | POA: Insufficient documentation

## 2020-02-01 LAB — HEMOGLOBIN: Hemoglobin: 18 g/dL — ABNORMAL HIGH (ref 13.0–17.0)

## 2020-02-01 LAB — HEMATOCRIT: HCT: 50.1 % (ref 39.0–52.0)

## 2020-02-02 ENCOUNTER — Encounter
Admission: RE | Admit: 2020-02-02 | Discharge: 2020-02-02 | Disposition: A | Discharge status: Home / Self Care | Payer: Medicare Other | Source: Ambulatory Visit | Attending: Orthopedic Surgery | Admitting: Orthopedic Surgery

## 2020-02-02 DIAGNOSIS — Z01818 Encounter for other preprocedural examination: Secondary | ICD-10-CM | POA: Diagnosis present

## 2020-02-02 LAB — COMPREHENSIVE METABOLIC PANEL
ALT: 32 U/L (ref 0–44)
AST: 38 U/L (ref 15–41)
Albumin: 4.4 g/dL (ref 3.5–5.0)
Alkaline Phosphatase: 34 U/L — ABNORMAL LOW (ref 38–126)
Anion gap: 11 (ref 5–15)
BUN: 18 mg/dL (ref 8–23)
CO2: 24 mmol/L (ref 22–32)
Calcium: 8.9 mg/dL (ref 8.9–10.3)
Chloride: 103 mmol/L (ref 98–111)
Creatinine, Ser: 0.87 mg/dL (ref 0.61–1.24)
GFR calc Af Amer: >60 mL/min (ref >60)
GFR calc non Af Amer: >60 mL/min (ref >60)
Glucose, Bld: 94 mg/dL (ref 70–99)
Potassium: 4 mmol/L (ref 3.5–5.1)
Sodium: 138 mmol/L (ref 135–145)
Total Bilirubin: 1 mg/dL (ref 0.3–1.2)
Total Protein: 7.5 g/dL (ref 6.5–8.1)

## 2020-02-02 LAB — CBC
HCT: 48.4 % (ref 39.0–52.0)
Hemoglobin: 17.2 g/dL — ABNORMAL HIGH (ref 13.0–17.0)
MCH: 33.6 pg (ref 26.0–34.0)
MCHC: 35.5 g/dL (ref 30.0–36.0)
MCV: 94.5 fL (ref 80.0–100.0)
Platelets: 172 10*3/uL (ref 150–400)
RBC: 5.12 MIL/uL (ref 4.22–5.81)
RDW: 12.3 % (ref 11.5–15.5)
WBC: 4.8 10*3/uL (ref 4.0–10.5)
nRBC: 0 % (ref 0.0–0.2)

## 2020-02-02 LAB — URINALYSIS, ROUTINE W REFLEX MICROSCOPIC
Bilirubin Urine: NEGATIVE
Glucose, UA: NEGATIVE mg/dL
Hgb urine dipstick: NEGATIVE
Ketones, ur: NEGATIVE mg/dL
Leukocytes,Ua: NEGATIVE
Nitrite: NEGATIVE
Protein, ur: NEGATIVE mg/dL
Specific Gravity, Urine: 1.011 (ref 1.005–1.030)
pH: 6 (ref 5.0–8.0)

## 2020-02-02 LAB — SURGICAL PCR SCREEN
MRSA, PCR: NEGATIVE
Staphylococcus aureus: NEGATIVE

## 2020-02-02 LAB — TYPE AND SCREEN
ABO/RH(D): A POS
Antibody Screen: NEGATIVE

## 2020-02-02 LAB — APTT: aPTT: 30 seconds (ref 24–36)

## 2020-02-02 LAB — PROTIME-INR
INR: 1.2 (ref 0.8–1.2)
Prothrombin Time: 14.9 seconds (ref 11.4–15.2)

## 2020-02-02 LAB — C-REACTIVE PROTEIN: CRP: 1.2 mg/dL — ABNORMAL HIGH (ref <1.0)

## 2020-02-02 LAB — SEDIMENTATION RATE: Sed Rate: 3 mm/hr (ref 0–20)

## 2020-02-02 NOTE — Patient Instructions (Signed)
Your procedure is scheduled GY:FVCBSW 9/3  Report to Day Surgery. To find out your arrival time please call [phone] between 1PM - 3PM on Thurs 9/2.  Remember: Instructions that are not followed completely may result in serious medical risk,  up to and including death, or upon the discretion of your surgeon and anesthesiologist your  surgery may need to be rescheduled.     _X__ 1. Do not eat food after midnight the night before your procedure.                 No chewing gum or hard candies. You may drink clear liquids up to 2 hours                 before you are scheduled to arrive for your surgery- DO not drink clear                 liquids within 2 hours of the start of your surgery.                 Clear Liquids include:  water, apple juice without pulp, clear Gatorade, G2 or                  Gatorade Zero (avoid Red/Purple/Blue), Black Coffee or Tea (Do not add                 anything to coffee or tea). ___x__2.   Complete the "Ensure Clear Pre-surgery Clear Carbohydrate Drink" provided to you, 2 hours before arrival. **If you       are diabetic you will be provided with an alternative drink, Gatorade Zero or G2.  __X__2.  On the morning of surgery brush your teeth with toothpaste and water, you                may rinse your mouth with mouthwash if you wish.  Do not swallow any toothpaste of mouthwash.     _X__ 3.  No Alcohol for 24 hours before or after surgery.   ___ 4.  Do Not Smoke or use e-cigarettes For 24 Hours Prior to Your Surgery.                 Do not use any chewable tobacco products for at least 6 hours prior to                 Surgery. ___  5.  Do not use any recreational drugs (marijuana, cocaine, heroin, ecstasy, MDMA or other)                For at least one week prior to your surgery.  Combination of these drugs with anesthesia                May have life threatening results.  ____  6.  Bring all medications with you on the day of  surgery if instructed.   __x__  7.  Notify your doctor if there is any change in your medical condition      (cold, fever, infections).     Do not wear jewelry, make-up, hairpins, clips or nail polish. Do not wear lotions, powders, or perfumes. You may wear deodorant. Do not shave 48 hours prior to surgery. Men may shave face and neck. Do not bring valuables to the hospital.    [facility] is not responsible for any belongings or valuables.  Contacts, dentures or bridgework may not be worn into surgery. Leave your suitcase  in the car. After surgery it may be brought to your room. For patients admitted to the hospital, discharge time is determined by your treatment team.   Patients discharged the day of surgery will not be allowed to drive home.   Make arrangements for someone to be with you for the first 24 hours of your Same Day Discharge.    Please read over the following fact sheets that you were given:    __x__ Take these medicines the morning of surgery with A SIP OF WATER:    1. amLODipine (NORVASC) 10 MG tablet  2. citalopram (CELEXA) 20 MG tablet  3. clonazePAM (KLONOPIN) 0.5 MG tablet  If needed  4.loratadine (CLARITIN) 10 MG tablet if needed  5.tamsulosin (FLOMAX) 0.4 MG CAPS capsule  6.  ____ Fleet Enema (as directed)   _x___ Use CHG Soap (or wipes) as directed  ____ Use Benzoyl Peroxide Gel as instructed  ____ Use inhalers on the day of surgery  ____ Stop metformin 2 days prior to surgery    ____ Take 1/2 of usual insulin dose the night before surgery. No insulin the morning          of surgery.   ____ Stop Coumadin/Plavix/aspirin on   __x__ Stop Anti-inflammatories  No ibuprofen aleve or aspirin products 1 week prior to surgery.  May take tylenol   ____ Stop supplements until after surgery.    ____ Bring C-Pap to the hospital.    If you have any questions regarding your pre-procedure instructions,  Please call Pre-admit Testing at [facility]

## 2020-02-03 LAB — URINE CULTURE
Culture: NO GROWTH
Special Requests: NORMAL

## 2020-02-07 ENCOUNTER — Encounter: Payer: Self-pay | Admitting: Orthopedic Surgery

## 2020-02-07 NOTE — H&P (Signed)
ORTHOPAEDIC HISTORY & PHYSICAL  Progress Notes [name], [place] - 02/03/2020 1:45 PM EDT [facility] Chief Complaint:   Chief Complaint  Patient presents with  . Knee Pain  H & P LEFT KNEE   History of Present Illness:   [name] is a 69 y.o. male that presents to clinic today for his preoperative history and evaluation. Patient presents unaccompanied. The patient is scheduled to undergo a left total knee arthroplasty on 02/10/20 by Dr. Marry Guan. His pain began around 2 years ago. The pain is located in the medial aspect of the left knee. He describes his pain as worse with weightbearing. He reports associated swelling and some giving way of the knee. He denies associated numbness or tingling, denies locking of the knee.   The patient's symptoms have progressed to the point that they decrease his quality of life. The patient has previously undergone conservative treatment including NSAIDS and injections to the knee without adequate control of his symptoms.  Patient does have a history of lumbar fusion, L3-S1. Denies history of blood clots, significant cardiology history. No pertinent drug allergies.  Past Medical, Surgical, Family, Social History, Allergies, Medications:   Past Medical History:  Past Medical History:  Diagnosis Date  . Apnea, sleep 12/07/2014  . Back pain  . Cancer (CMS-HCC)  skin cancer  . Carpal tunnel syndrome  . Chronic hepatitis C without mention of hepatic coma (CMS-HCC)  from transfusion; treated 2006 with interferon with relapse treated with Harvoni in 2016  . Colonic polyp  . Depressive disorder, not elsewhere classified  . Encounter for blood transfusion  . GERD (gastroesophageal reflux disease)  . Gout, joint  . Hypertension  . Osteoarthrosis, unspecified whether generalized or localized, unspecified site  . Peripheral neuropathy  severe in feet  . Reflux esophagitis  . Testicular  dysfunction  . Ventral hernia   Past Surgical History:  Past Surgical History:  Procedure Laterality Date  . ANTERIOR FUSION CERVICAL SPINE  x 2; 3/4, 4/5, 5/6  . ARTHRODESIS POSTERIOR LUMBAR SPINE W/LAMINECTOMY/DISCECTOMY  . ARTHROSCOPY SHOULDER Bilateral  . AUTOGRAFT MORSELIZED OBTAINED SEPARATE INCISION FOR SPINE SURGERY Right 11/13/2017  Procedure: AUTOGRAFT FOR SPINE SURGERY ONLY (INCLUDES HARVESTING THE GRAFT); MORSELIZED (THROUGH SEPARATE SKIN OR FASCIAL INCISION); Surgeon: Loni Dolly, MD; Location: [facility]; Service: Orthopedics; Laterality: Right;  . BACK SURGERY  . CHOLECYSTECTOMY  . COLONOSCOPY 07/22/2010  PH Adenomatous Polyps: CBF 07/2015; Colon 12/18/2014 w/Dr. Allen Norris MD (dw)  . COLONOSCOPY 11/29/2004, 03/11/2004  Adenomatous Polyps  . COLONOSCOPY 12/18/2014  Dr. Allen Norris MD  . EGD 03/17/2013, 07/22/2010, 03/11/2004, 04/10/2000  No repeat per RTE  . EGD 12/18/2014  Dr. Allen Norris MD  . ELBOW ARTHROSCOPY Bilateral  . EXTRACTION CATARACT EXTRACAPSULAR W/INSERTION INTRAOCULAR PROSTHESIS Left  . EYELID REPAIR W/ SKIN GRAFT Left 1970  . FRACTURE SURGERY  . INGUINAL HERNIA REPAIR Bilateral  . INSTRUMENTATION POSTERIOR SPINE 3 TO 6 VERTEBRAL SEGMENTS Bilateral 11/13/2017  Procedure: POSTERIOR SEGMENTAL INSTRUMENTATION (EG, PEDICLE FIXATION, DUAL RODS WITH MULTIPLE HOOKS AND SUBLAMINAR WIRES); 3 TO 6 VERTEBRAL SEGMENTS; Surgeon: Loni Dolly, MD; Location: [facility]; Service: Orthopedics; Laterality: Bilateral;  . LAMINECTOMY POSTERIOR CERVICLE DECOMP W/FACETECTOMY & FORAMINOTOMY N/A 04/14/2013  Procedure: LAMINEC/FACETECT/FORAMIN,EACH ADDNL; Surgeon: Donnamarie Rossetti, MD; Location: [facility]; Service: Neurosurgery; Laterality: N/A;  . LAMINECTOMY POSTERIOR CERVICLE DECOMP W/FACETECTOMY & FORAMINOTOMY Bilateral 11/13/2017  Procedure: LAMINECTOMY, FACETECTOMY AND FORAMINOTOMY, SINGLE VERTEBRAL SEGMENT; EACH ADDITIONAL SEGMENT, CERVICAL, THORACIC, OR LUMBAR; [address] x2; Surgeon:  Derian, Nicholaus Corolla, MD; Location: [facility]; Service: Orthopedics; Laterality: Bilateral;  . LAMINECTOMY POSTERIOR LUMBAR FACETECTOMY & FORAMINOTOMY W/DECOMP N/A 04/14/2013  Procedure: LAMINECTOMY POSTERIOR LUMBAR FACETECTOMY & FORAMINOTOMY W/DECOMP c-arm Mccullough retractors Microscope and instruments patient prone, wilson frame on regular table anspach - [id number] bit ; Surgeon: Donnamarie Rossetti, MD; Location: [facility]; Service: Neurosurgery; Laterality: N/A;  . LAMINECTOMY POSTERIOR LUMBAR FACETECTOMY & FORAMINOTOMY W/DECOMP Bilateral 11/13/2017  Procedure: LAMINECTOMY, FACETECTOMY AND FORAMINOTOMY (UNILATERAL OR BILATERAL WITH DECOMPRESSION OF SPINAL CORD, CAUDA EQUINA AND/OR NERVE ROOT(S), SINGLE VERTEBRAL SEGMENT; LUMBAR; Surgeon: Loni Dolly, MD; Location: [facility]; Service: Orthopedics; Laterality: Bilateral;  . LAPAROSCOPIC ESOPHAGOGASTRIC FUNDOPLASTY (NISSEN PROCEDURE)  . MICROSURGERY N/A 04/14/2013  Procedure: MICROSURGERY; Surgeon: Donnamarie Rossetti, MD; Location: [facility]; Service: Neurosurgery; Laterality: N/A;  . MOHS SURGERY  nose  . OLECRANON BURSA EXCISION Left 02/16/2015  . POSTERIOR FUSION CERVICAL SPINE  x 2 (C3-C6)  . POSTERIOR LUMBAR SPINE FUSION ONE LEVEL LATERAL TRANSVERSE TECHNIQUE Bilateral 11/13/2017  Procedure: BILATERAL LUMBAR DECOMPRESSION, STABILIZATION SPINAL FUSION L3-4, L4-5, L5-S1. RIGHT ICBG - VIVIGEN ALLOGRAFT.; Surgeon: Loni Dolly, MD; Location: [facility]; Service: Orthopedics; Laterality: Bilateral;  . [place] SPINAL NERVES 2004  . RECONSTRUCTION OF NOSE N/A 1970  S/P surgical repair with skin grafting  . SEPTOPLASTY  multiple   Current Medications:  Current Outpatient Medications  Medication Sig Dispense Refill  . amLODIPine (NORVASC) 10 MG tablet TAKE 1 TABLET BY MOUTH ONCE DAILY 90 tablet 3  . brimonidine (LUMIFY) 0.025 % Drop Apply 1 drop to eye once daily as needed  . citalopram (CELEXA) 20 MG  tablet Take 1 tablet (20 mg total) by mouth once daily 90 tablet 3  . clonazePAM (KLONOPIN) 0.5 MG tablet Take 1 tablet (0.5 mg total) by mouth 2 (two) times daily as needed 30 tablet 0  . FUROsemide (LASIX) 40 MG tablet TAKE 1 TABLET BY MOUTH EVERY DAY AS NEEDED FOR EDEMA 90 tablet 3  . loratadine (CLARITIN) 10 mg tablet Take 10 mg by mouth once daily  . multivitamin tablet Take 1 tablet by mouth once daily  . spironolactone (ALDACTONE) 25 MG tablet TAKE 1 TABLET BY MOUTH EVERY DAY 90 tablet 1  . syringe with needle 3 mL 23 x 1" Syrg USE AS DIRECTED FOR TESTOSTERONE INJECTION 100 Syringe 2  . tamsulosin (FLOMAX) 0.4 mg capsule TAKE ONE CAPSULE BY MOUTH ONCE DAILY (Patient taking differently: 2 (two) times daily ) 90 capsule 3  . testosterone cypionate (DEPO-TESTOSTERONE) 200 mg/mL injection INJECT 0.6 ML IN THE MUSCLE ONCE A WEEK 10 mL 0  . traZODone (DESYREL) 50 MG tablet TAKE 1 TABLET BY MOUTH NIGHTLY 90 tablet 3   No current facility-administered medications for this visit.   Allergies: No Known Allergies  Social History:  Social History   Socioeconomic History  . Marital status: Married  Spouse name: Not on file  . Number of children: Not on file  . Years of education: Not on file  . Highest education level: Not on file  Occupational History  . Not on file  Tobacco Use  . Smoking status: Never Smoker  . Smokeless tobacco: Former Systems developer  Types: Secondary school teacher  . Vaping Use: Never used  Substance and Sexual Activity  . Alcohol use: Yes  Comment: remotely quit  . Drug use: No  . Sexual activity: Not on file  Other Topics Concern  . Not on file  Social History Narrative  Lives with spouse. 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   Social Determinants of Health   Financial Resource Strain:  . Difficulty of Paying Living Expenses:  Food Insecurity:  . Worried About Charity fundraiser in the Last Year:  . Youth worker in the Last Year:  Transportation Needs:  . Film/video editor (Medical):  [name] Kitchen Lack of Transportation (Non-Medical):  Physical Activity:  . Days of Exercise per Week:  . Minutes of Exercise per Session:  Stress:  . Feeling of Stress :  Social Connections:  . Frequency of Communication with Friends and Family:  . Frequency of Social Gatherings with Friends and Family:  . Attends Religious Services:  . Active Member of Clubs or Organizations:  . Attends Archivist Meetings:  [name] Kitchen Marital Status:   Family History:  Family History  Problem Relation Age of Onset  . Lung cancer Mother  . Coronary Artery Disease (Blocked arteries around heart) Father  . Myocardial Infarction (Heart attack) Father 96  . Coronary Artery Disease (Blocked arteries around heart) Sister  . Depression Sister  . Gout Brother  . Anesthesia problems Neg Hx  . Malignant hyperthermia Neg Hx   Review of Systems:   A 10+ ROS was performed, reviewed, and the pertinent orthopaedic findings are documented in the HPI.   Physical Examination:   BP 130/80  Ht 175.3 cm (5\' 9" )  Wt (!) 104.5 kg (230 lb 6.4 oz)  BMI 34.02 kg/m   Patient is a well-developed, well-nourished male in no acute distress. Patient has normal mood and affect. Patient is alert and oriented to person, place, and time.   HEENT: Atraumatic, normocephalic. Pupils equal and reactive to light. Extraocular motion intact. Noninjected sclera.  Cardiovascular: Regular rate and rhythm, with no murmurs, rubs, or gallops. Distal pulses palpable.  Respiratory: Lungs clear to auscultation bilaterally.   Left Knee: Soft tissue swelling: minimal Effusion: none Erythema: none Crepitance: mild Tenderness: medial Alignment: relative varus Mediolateral laxity: medial pseudolaxity Posterior sag: negative Patellar tracking: Good tracking without evidence of subluxation or tilt Atrophy: No significant atrophy.  Quadriceps tone was  good. Range of motion: 0/0/126 degrees  Sensation intact over the saphenous, superficial fibular, and deep fibular nerve distributions, with decreased sensation noted over the lateral sural cutaneous distribution.  Tests Performed/Reviewed:  X-rays  No new radiographs were obtained today. Previous radiographs were reviewed of the left knee and revealed loss of medial and patellofemoral joint space with associated osteophyte formation. Lateral compartment appears relatively well-preserved. No fractures or dislocations.  MRI MRI OF THE LEFT KNEE WITHOUT CONTRAST   TECHNIQUE:  Multiplanar, multisequence MR imaging of the knee was performed. No  intravenous contrast was administered.   COMPARISON: None.   FINDINGS:  MENISCI   Medial meniscus: Peripheral horizontal tear at the body/posterior  horn junction. Additional radial tear of the posterior horn.   Lateral meniscus: Intact.   LIGAMENTS   Cruciates: Intact ACL and PCL.   Collaterals: Medial collateral ligament is intact. Lateral  collateral ligament complex is intact.   CARTILAGE   Patellofemoral: Scattered high-grade partial-thickness cartilage  loss with tiny foci of subchondral marrow edema.   Medial: Focal full-thickness cartilage defect over the central  weight-bearing medial femoral condyle with underlying subchondral  marrow edema.   Lateral: Thinning over the lateral femoral condyle. No focal defect.   Joint: Small joint effusion. Mild edema within Hoffa's fat.   Popliteal Fossa: No Baker cyst. Intact  popliteus tendon.   Extensor Mechanism: Intact quadriceps tendon and patellar tendon.  Intact medial and lateral patellar retinaculum. Intact MPFL.   Bones: Small subchondral insufficiency fracture of the medial  femoral condyle with adjacent marrow edema. Stress fracture of the  medial tibial plateau with adjacent marrow edema. No dislocation. No  suspicious bone lesion.   Other: None.    IMPRESSION:  1. Stress fracture of the medial tibial plateau.  2. Subchondral insufficiency fracture of the medial femoral condyle.  3. Peripheral horizontal tear of the medial meniscus body/posterior  horn junction. Additional radial tear of the posterior horn.  4. Mild medial and patellofemoral compartment osteoarthritis.   Electronically Signed  By: Titus Dubin M.D.  On: 10/23/2018 12:53  Impression:   ICD-10-CM  1. Primary osteoarthritis of left knee M17.12   Plan:   The patient has end-stage degenerative changes of the left knee. It was explained to the patient that the condition is progressive in nature. Having failed conservative treatment, the patient has elected to proceed with a total joint arthroplasty. The patient will undergo a total joint arthroplasty with Dr. Marry Guan. The risks of surgery, including blood clot and infection, were discussed with the patient. Measures to reduce these risks, including the use of anticoagulation, perioperative antibiotics, and early ambulation were discussed. The importance of postoperative physical therapy was discussed with the patient. The patient elects to proceed with surgery. The patient is instructed to stop all blood thinners prior to surgery. The patient is instructed to call the hospital the day before surgery to learn of the proper arrival time.   Contact our office with any questions or concerns. Follow up as indicated, or sooner should any new problems arise, if conditions worsen, or if they are otherwise concerned.   Jorge Fudge, PA-C [facility] [address], [place] [address] Phone: [phone]  This note was generated in part with voice recognition software and I apologize for any typographical errors that were not detected and corrected.   Electronically signed by Jorge Fudge, PA at 02/07/2020 8:39 AM EDT

## 2020-02-08 ENCOUNTER — Other Ambulatory Visit: Payer: Self-pay

## 2020-02-08 ENCOUNTER — Other Ambulatory Visit
Admission: RE | Admit: 2020-02-08 | Discharge: 2020-02-08 | Disposition: A | Discharge status: Home / Self Care | Payer: Medicare Other | Source: Ambulatory Visit | Attending: Orthopedic Surgery | Admitting: Orthopedic Surgery

## 2020-02-08 ENCOUNTER — Other Ambulatory Visit: Payer: Medicare Other

## 2020-02-08 DIAGNOSIS — Z20822 Contact with and (suspected) exposure to covid-19: Secondary | ICD-10-CM | POA: Diagnosis not present

## 2020-02-08 DIAGNOSIS — Z01812 Encounter for preprocedural laboratory examination: Secondary | ICD-10-CM | POA: Insufficient documentation

## 2020-02-08 LAB — SARS CORONAVIRUS 2 (TAT 6-24 HRS): SARS Coronavirus 2: NEGATIVE

## 2020-02-10 ENCOUNTER — Ambulatory Visit
Admission: RE | Admit: 2020-02-10 | Discharge: 2020-02-10 | Disposition: A | Discharge status: Home / Self Care | Payer: Medicare Other | Attending: Orthopedic Surgery | Admitting: Orthopedic Surgery

## 2020-02-10 ENCOUNTER — Ambulatory Visit: Payer: Medicare Other | Admitting: Urgent Care

## 2020-02-10 ENCOUNTER — Other Ambulatory Visit: Payer: Self-pay

## 2020-02-10 ENCOUNTER — Encounter
Admission: RE | Disposition: A | Discharge status: Home / Self Care | Payer: Self-pay | Source: Home / Self Care | Attending: Orthopedic Surgery

## 2020-02-10 ENCOUNTER — Encounter: Payer: Self-pay | Admitting: Orthopedic Surgery

## 2020-02-10 ENCOUNTER — Ambulatory Visit: Payer: Medicare Other | Admitting: Certified Registered Nurse Anesthetist

## 2020-02-10 ENCOUNTER — Ambulatory Visit: Payer: Medicare Other

## 2020-02-10 DIAGNOSIS — Z79899 Other long term (current) drug therapy: Secondary | ICD-10-CM | POA: Diagnosis not present

## 2020-02-10 DIAGNOSIS — F419 Anxiety disorder, unspecified: Secondary | ICD-10-CM | POA: Diagnosis not present

## 2020-02-10 DIAGNOSIS — Z981 Arthrodesis status: Secondary | ICD-10-CM | POA: Diagnosis not present

## 2020-02-10 DIAGNOSIS — Z96659 Presence of unspecified artificial knee joint: Secondary | ICD-10-CM

## 2020-02-10 DIAGNOSIS — N400 Enlarged prostate without lower urinary tract symptoms: Secondary | ICD-10-CM | POA: Diagnosis not present

## 2020-02-10 DIAGNOSIS — Z8249 Family history of ischemic heart disease and other diseases of the circulatory system: Secondary | ICD-10-CM | POA: Diagnosis not present

## 2020-02-10 DIAGNOSIS — Z85828 Personal history of other malignant neoplasm of skin: Secondary | ICD-10-CM | POA: Insufficient documentation

## 2020-02-10 DIAGNOSIS — F329 Major depressive disorder, single episode, unspecified: Secondary | ICD-10-CM | POA: Diagnosis not present

## 2020-02-10 DIAGNOSIS — K7460 Unspecified cirrhosis of liver: Secondary | ICD-10-CM | POA: Insufficient documentation

## 2020-02-10 DIAGNOSIS — M25762 Osteophyte, left knee: Secondary | ICD-10-CM | POA: Diagnosis not present

## 2020-02-10 DIAGNOSIS — M1712 Unilateral primary osteoarthritis, left knee: Secondary | ICD-10-CM | POA: Diagnosis present

## 2020-02-10 DIAGNOSIS — I10 Essential (primary) hypertension: Secondary | ICD-10-CM | POA: Insufficient documentation

## 2020-02-10 DIAGNOSIS — G629 Polyneuropathy, unspecified: Secondary | ICD-10-CM | POA: Insufficient documentation

## 2020-02-10 DIAGNOSIS — Z96652 Presence of left artificial knee joint: Secondary | ICD-10-CM

## 2020-02-10 HISTORY — PX: KNEE ARTHROPLASTY: SHX992

## 2020-02-10 LAB — ABO/RH: ABO/RH(D): A POS

## 2020-02-10 SURGERY — ARTHROPLASTY, KNEE, TOTAL, USING IMAGELESS COMPUTER-ASSISTED NAVIGATION
Anesthesia: Spinal | Site: Knee | Laterality: Left

## 2020-02-10 MED ORDER — SODIUM CHLORIDE FLUSH 0.9 % IV SOLN
INTRAVENOUS | Status: AC
  Filled 2020-02-10: qty 40

## 2020-02-10 MED ORDER — TRANEXAMIC ACID-NACL 1000-0.7 MG/100ML-% IV SOLN: 1000.0000 mg | Freq: Once | INTRAVENOUS | Status: AC

## 2020-02-10 MED ORDER — SODIUM CHLORIDE 0.9 % IV SOLN
INTRAVENOUS | Status: DC | PRN
  Administered 2020-02-10: 60 mL

## 2020-02-10 MED ORDER — DEXAMETHASONE SODIUM PHOSPHATE 10 MG/ML IJ SOLN
INTRAMUSCULAR | Status: AC
  Filled 2020-02-10: qty 1

## 2020-02-10 MED ORDER — DIPHENHYDRAMINE HCL 12.5 MG/5ML PO ELIX
12.5000 mg | ORAL_SOLUTION | ORAL | Status: DC | PRN
  Filled 2020-02-10: qty 10

## 2020-02-10 MED ORDER — PROPOFOL 500 MG/50ML IV EMUL
INTRAVENOUS | Status: AC
  Filled 2020-02-10: qty 50

## 2020-02-10 MED ORDER — CHLORHEXIDINE GLUCONATE 0.12 % MT SOLN: 15.0000 mL | Freq: Once | OROMUCOSAL | Status: AC

## 2020-02-10 MED ORDER — ENOXAPARIN SODIUM 40 MG/0.4ML ~~LOC~~ SOLN: 40.0000 mg | SUBCUTANEOUS | 0 refills | Status: DC

## 2020-02-10 MED ORDER — DEXAMETHASONE SODIUM PHOSPHATE 10 MG/ML IJ SOLN
INTRAMUSCULAR | Status: AC
  Administered 2020-02-10: 8 mg via INTRAVENOUS
  Filled 2020-02-10: qty 1

## 2020-02-10 MED ORDER — CELECOXIB 200 MG PO CAPS
ORAL_CAPSULE | ORAL | Status: AC
  Administered 2020-02-10: 400 mg via ORAL
  Filled 2020-02-10: qty 2

## 2020-02-10 MED ORDER — ACETAMINOPHEN 10 MG/ML IV SOLN: 1000.0000 mg | Freq: Four times a day (QID) | INTRAVENOUS | Status: DC

## 2020-02-10 MED ORDER — BUPIVACAINE LIPOSOME 1.3 % IJ SUSP
INTRAMUSCULAR | Status: AC
  Filled 2020-02-10: qty 20

## 2020-02-10 MED ORDER — MENTHOL 3 MG MT LOZG
1.0000 | LOZENGE | OROMUCOSAL | Status: DC | PRN
  Filled 2020-02-10: qty 9

## 2020-02-10 MED ORDER — LIDOCAINE HCL (CARDIAC) PF 100 MG/5ML IV SOSY
PREFILLED_SYRINGE | INTRAVENOUS | Status: DC | PRN
  Administered 2020-02-10: 50 mg via INTRAVENOUS

## 2020-02-10 MED ORDER — CELECOXIB 200 MG PO CAPS: 200.0000 mg | ORAL_CAPSULE | Freq: Two times a day (BID) | ORAL | 2 refills | Status: DC

## 2020-02-10 MED ORDER — ROCURONIUM BROMIDE 10 MG/ML (PF) SYRINGE
PREFILLED_SYRINGE | INTRAVENOUS | Status: AC
  Filled 2020-02-10: qty 10

## 2020-02-10 MED ORDER — FERROUS SULFATE 325 (65 FE) MG PO TABS
325.0000 mg | ORAL_TABLET | Freq: Two times a day (BID) | ORAL | Status: DC
  Filled 2020-02-10: qty 1

## 2020-02-10 MED ORDER — SODIUM CHLORIDE 0.9 % IV SOLN
INTRAVENOUS | Status: DC | PRN
  Administered 2020-02-10: 45 ug/min via INTRAVENOUS
  Administered 2020-02-10: 30 ug/min via INTRAVENOUS
  Administered 2020-02-10 (×2): 45 ug/min via INTRAVENOUS

## 2020-02-10 MED ORDER — BUPIVACAINE HCL (PF) 0.25 % IJ SOLN
INTRAMUSCULAR | Status: AC
  Filled 2020-02-10: qty 30

## 2020-02-10 MED ORDER — CEFAZOLIN SODIUM-DEXTROSE 2-4 GM/100ML-% IV SOLN
INTRAVENOUS | Status: AC
  Filled 2020-02-10: qty 100

## 2020-02-10 MED ORDER — TRANEXAMIC ACID-NACL 1000-0.7 MG/100ML-% IV SOLN
INTRAVENOUS | Status: AC
  Administered 2020-02-10: 1000 mg via INTRAVENOUS
  Filled 2020-02-10: qty 100

## 2020-02-10 MED ORDER — FENTANYL CITRATE (PF) 100 MCG/2ML IJ SOLN
INTRAMUSCULAR | Status: AC
  Administered 2020-02-10: 25 ug via INTRAVENOUS
  Filled 2020-02-10: qty 2

## 2020-02-10 MED ORDER — PHENYLEPHRINE HCL (PRESSORS) 10 MG/ML IV SOLN
INTRAVENOUS | Status: AC
  Filled 2020-02-10: qty 1

## 2020-02-10 MED ORDER — CELECOXIB 200 MG PO CAPS: 200.0000 mg | ORAL_CAPSULE | Freq: Two times a day (BID) | ORAL | Status: DC

## 2020-02-10 MED ORDER — ONDANSETRON HCL 4 MG/2ML IJ SOLN
INTRAMUSCULAR | Status: AC
  Filled 2020-02-10: qty 2

## 2020-02-10 MED ORDER — OXYCODONE HCL 5 MG PO TABS
ORAL_TABLET | ORAL | Status: AC
  Filled 2020-02-10: qty 1

## 2020-02-10 MED ORDER — OXYCODONE HCL 5 MG PO TABS
ORAL_TABLET | ORAL | Status: AC
  Administered 2020-02-10: 5 mg via ORAL
  Filled 2020-02-10: qty 1

## 2020-02-10 MED ORDER — GABAPENTIN 300 MG PO CAPS
ORAL_CAPSULE | ORAL | Status: AC
  Administered 2020-02-10: 300 mg via ORAL
  Filled 2020-02-10: qty 1

## 2020-02-10 MED ORDER — BUPIVACAINE HCL (PF) 0.5 % IJ SOLN
INTRAMUSCULAR | Status: DC | PRN
  Administered 2020-02-10: 2.5 mL via INTRATHECAL

## 2020-02-10 MED ORDER — CHLORHEXIDINE GLUCONATE 0.12 % MT SOLN
OROMUCOSAL | Status: AC
  Administered 2020-02-10: 15 mL via OROMUCOSAL
  Filled 2020-02-10: qty 15

## 2020-02-10 MED ORDER — BUPIVACAINE HCL (PF) 0.25 % IJ SOLN
INTRAMUSCULAR | Status: DC | PRN
  Administered 2020-02-10: 60 mL

## 2020-02-10 MED ORDER — ORAL CARE MOUTH RINSE: 15.0000 mL | Freq: Once | OROMUCOSAL | Status: AC

## 2020-02-10 MED ORDER — PROPOFOL 500 MG/50ML IV EMUL
INTRAVENOUS | Status: DC | PRN
  Administered 2020-02-10: 60 ug/kg/min via INTRAVENOUS
  Administered 2020-02-10: 50 ug/kg/min via INTRAVENOUS
  Administered 2020-02-10: 60 ug/kg/min via INTRAVENOUS

## 2020-02-10 MED ORDER — METOCLOPRAMIDE HCL 10 MG PO TABS: 10.0000 mg | ORAL_TABLET | Freq: Three times a day (TID) | ORAL | Status: DC

## 2020-02-10 MED ORDER — ENOXAPARIN SODIUM 40 MG/0.4ML ~~LOC~~ SOLN: 40.0000 mg | SUBCUTANEOUS | Status: DC

## 2020-02-10 MED ORDER — CELECOXIB 200 MG PO CAPS: 400.0000 mg | ORAL_CAPSULE | Freq: Once | ORAL | Status: AC

## 2020-02-10 MED ORDER — GABAPENTIN 300 MG PO CAPS: 300.0000 mg | ORAL_CAPSULE | Freq: Every day | ORAL | Status: DC

## 2020-02-10 MED ORDER — ONDANSETRON HCL 4 MG PO TABS: 4.0000 mg | ORAL_TABLET | Freq: Four times a day (QID) | ORAL | Status: DC | PRN

## 2020-02-10 MED ORDER — ACETAMINOPHEN 10 MG/ML IV SOLN
INTRAVENOUS | Status: AC
  Filled 2020-02-10: qty 100

## 2020-02-10 MED ORDER — FENTANYL CITRATE (PF) 100 MCG/2ML IJ SOLN
INTRAMUSCULAR | Status: DC | PRN
Start: 2020-02-10 — End: 2020-02-10
  Administered 2020-02-10: 50 ug via INTRAVENOUS

## 2020-02-10 MED ORDER — TRAMADOL HCL 50 MG PO TABS: 50.0000 mg | ORAL_TABLET | ORAL | Status: DC | PRN

## 2020-02-10 MED ORDER — FLEET ENEMA 7-19 GM/118ML RE ENEM: 1.0000 | ENEMA | Freq: Once | RECTAL | Status: DC | PRN

## 2020-02-10 MED ORDER — FENTANYL CITRATE (PF) 100 MCG/2ML IJ SOLN
INTRAMUSCULAR | Status: AC
  Filled 2020-02-10: qty 2

## 2020-02-10 MED ORDER — CEFAZOLIN SODIUM-DEXTROSE 2-4 GM/100ML-% IV SOLN
2.0000 g | Freq: Four times a day (QID) | INTRAVENOUS | Status: DC
  Administered 2020-02-10: 2 g via INTRAVENOUS

## 2020-02-10 MED ORDER — SODIUM CHLORIDE 0.9 % IV SOLN: INTRAVENOUS | Status: DC

## 2020-02-10 MED ORDER — ENSURE PRE-SURGERY PO LIQD
296.0000 mL | Freq: Once | ORAL | Status: DC
  Filled 2020-02-10: qty 296

## 2020-02-10 MED ORDER — PANTOPRAZOLE SODIUM 40 MG PO TBEC
40.0000 mg | DELAYED_RELEASE_TABLET | Freq: Two times a day (BID) | ORAL | Status: DC
  Filled 2020-02-10: qty 1

## 2020-02-10 MED ORDER — EPHEDRINE SULFATE 50 MG/ML IJ SOLN
INTRAMUSCULAR | Status: DC | PRN
  Administered 2020-02-10 (×3): 10 mg via INTRAVENOUS

## 2020-02-10 MED ORDER — ONDANSETRON HCL 4 MG/2ML IJ SOLN
INTRAMUSCULAR | Status: DC | PRN
  Administered 2020-02-10: 4 mg via INTRAVENOUS

## 2020-02-10 MED ORDER — GABAPENTIN 300 MG PO CAPS: 300.0000 mg | ORAL_CAPSULE | Freq: Once | ORAL | Status: AC

## 2020-02-10 MED ORDER — OXYCODONE HCL 5 MG PO TABS: 5.0000 mg | ORAL_TABLET | ORAL | 0 refills | Status: DC | PRN

## 2020-02-10 MED ORDER — MAGNESIUM HYDROXIDE 400 MG/5ML PO SUSP
30.0000 mL | Freq: Every day | ORAL | Status: DC
  Filled 2020-02-10: qty 30

## 2020-02-10 MED ORDER — OXYCODONE HCL 5 MG PO TABS
5.0000 mg | ORAL_TABLET | ORAL | Status: DC | PRN
  Administered 2020-02-10: 5 mg via ORAL

## 2020-02-10 MED ORDER — DEXAMETHASONE SODIUM PHOSPHATE 10 MG/ML IJ SOLN: 8.0000 mg | Freq: Once | INTRAMUSCULAR | Status: AC

## 2020-02-10 MED ORDER — HYDROMORPHONE HCL 1 MG/ML IJ SOLN: 0.5000 mg | INTRAMUSCULAR | Status: DC | PRN

## 2020-02-10 MED ORDER — OXYCODONE HCL 5 MG PO TABS: 10.0000 mg | ORAL_TABLET | ORAL | Status: DC | PRN

## 2020-02-10 MED ORDER — PROPOFOL 500 MG/50ML IV EMUL
INTRAVENOUS | Status: AC
  Filled 2020-02-10: qty 150

## 2020-02-10 MED ORDER — MIDAZOLAM HCL 5 MG/5ML IJ SOLN
INTRAMUSCULAR | Status: DC | PRN
  Administered 2020-02-10: 2 mg via INTRAVENOUS

## 2020-02-10 MED ORDER — ACETAMINOPHEN 10 MG/ML IV SOLN
INTRAVENOUS | Status: DC | PRN
  Administered 2020-02-10: 1000 mg via INTRAVENOUS

## 2020-02-10 MED ORDER — CHLORHEXIDINE GLUCONATE 4 % EX LIQD: 60.0000 mL | Freq: Once | CUTANEOUS | Status: DC

## 2020-02-10 MED ORDER — PHENOL 1.4 % MT LIQD
1.0000 | OROMUCOSAL | Status: DC | PRN
  Filled 2020-02-10: qty 177

## 2020-02-10 MED ORDER — ALUM & MAG HYDROXIDE-SIMETH 200-200-20 MG/5ML PO SUSP: 30.0000 mL | ORAL | Status: DC | PRN

## 2020-02-10 MED ORDER — FENTANYL CITRATE (PF) 100 MCG/2ML IJ SOLN
25.0000 ug | INTRAMUSCULAR | Status: DC | PRN
  Administered 2020-02-10: 25 ug via INTRAVENOUS

## 2020-02-10 MED ORDER — ONDANSETRON HCL 4 MG/2ML IJ SOLN: 4.0000 mg | Freq: Once | INTRAMUSCULAR | Status: DC | PRN

## 2020-02-10 MED ORDER — TRANEXAMIC ACID-NACL 1000-0.7 MG/100ML-% IV SOLN
1000.0000 mg | INTRAVENOUS | Status: AC
  Administered 2020-02-10: 1000 mg via INTRAVENOUS

## 2020-02-10 MED ORDER — CEFAZOLIN SODIUM-DEXTROSE 2-4 GM/100ML-% IV SOLN
2.0000 g | INTRAVENOUS | Status: AC
  Administered 2020-02-10: 2 g via INTRAVENOUS

## 2020-02-10 MED ORDER — TRAMADOL HCL 50 MG PO TABS
50.0000 mg | ORAL_TABLET | Freq: Four times a day (QID) | ORAL | 0 refills | Status: DC | PRN
Start: 2020-02-10 — End: 2020-08-08

## 2020-02-10 MED ORDER — ONDANSETRON HCL 4 MG/2ML IJ SOLN: 4.0000 mg | Freq: Four times a day (QID) | INTRAMUSCULAR | Status: DC | PRN

## 2020-02-10 MED ORDER — TRANEXAMIC ACID-NACL 1000-0.7 MG/100ML-% IV SOLN
INTRAVENOUS | Status: AC
  Filled 2020-02-10: qty 100

## 2020-02-10 MED ORDER — GLYCOPYRROLATE 0.2 MG/ML IJ SOLN
INTRAMUSCULAR | Status: AC
  Filled 2020-02-10: qty 1

## 2020-02-10 MED ORDER — FAMOTIDINE 20 MG PO TABS
ORAL_TABLET | ORAL | Status: AC
  Administered 2020-02-10: 20 mg
  Filled 2020-02-10: qty 1

## 2020-02-10 MED ORDER — NEOMYCIN-POLYMYXIN B GU 40-200000 IR SOLN
Status: AC
  Filled 2020-02-10: qty 20

## 2020-02-10 MED ORDER — LACTATED RINGERS IV SOLN: INTRAVENOUS | Status: DC

## 2020-02-10 MED ORDER — LIDOCAINE HCL (PF) 2 % IJ SOLN
INTRAMUSCULAR | Status: AC
  Filled 2020-02-10: qty 5

## 2020-02-10 MED ORDER — BISACODYL 10 MG RE SUPP
10.0000 mg | Freq: Every day | RECTAL | Status: DC | PRN
  Filled 2020-02-10: qty 1

## 2020-02-10 MED ORDER — MIDAZOLAM HCL 2 MG/2ML IJ SOLN
INTRAMUSCULAR | Status: AC
  Filled 2020-02-10: qty 2

## 2020-02-10 MED ORDER — SENNOSIDES-DOCUSATE SODIUM 8.6-50 MG PO TABS
1.0000 | ORAL_TABLET | Freq: Two times a day (BID) | ORAL | Status: DC
  Filled 2020-02-10: qty 1

## 2020-02-10 MED ORDER — ACETAMINOPHEN 325 MG PO TABS: 325.0000 mg | ORAL_TABLET | Freq: Four times a day (QID) | ORAL | Status: DC | PRN

## 2020-02-10 MED ORDER — ROPIVACAINE HCL 5 MG/ML IJ SOLN
INTRAMUSCULAR | Status: AC
  Filled 2020-02-10: qty 20

## 2020-02-10 SURGICAL SUPPLY — 80 items
ATTUNE MED DOME PAT 41 KNEE (Knees) ×1 IMPLANT
ATTUNE PS FEM LT SZ 7 CEM KNEE (Femur) ×1 IMPLANT
ATTUNE PSRP INSR SZ7 5 KNEE (Insert) ×1 IMPLANT
BASE TIBIAL ROT PLAT SZ 8 KNEE (Knees) IMPLANT
BATTERY INSTRU NAVIGATION (MISCELLANEOUS) ×8 IMPLANT
BLADE SAW 70X12.5 (BLADE) ×2 IMPLANT
BLADE SAW 90X13X1.19 OSCILLAT (BLADE) ×2 IMPLANT
BLADE SAW 90X25X1.19 OSCILLAT (BLADE) ×2 IMPLANT
BONE CEMENT GENTAMICIN (Cement) ×4 IMPLANT
BSPLAT TIB 8 CMNT ROT PLAT STR (Knees) ×1 IMPLANT
BTRY SRG DRVR LF (MISCELLANEOUS) ×4
CANISTER PREVENA 45 (CANNISTER) ×1 IMPLANT
CANISTER SUCT 3000ML PPV (MISCELLANEOUS) ×2 IMPLANT
CANISTER WOUND CARE 500ML ATS (WOUND CARE) ×1 IMPLANT
CEMENT BONE GENTAMICIN 40 (Cement) IMPLANT
COOLER POLAR GLACIER W/PUMP (MISCELLANEOUS) ×2 IMPLANT
COVER WAND RF STERILE (DRAPES) ×2 IMPLANT
CUFF TOURN SGL QUICK 24 (TOURNIQUET CUFF)
CUFF TOURN SGL QUICK 30 (TOURNIQUET CUFF)
CUFF TRNQT CYL 24X4X16.5-23 (TOURNIQUET CUFF) IMPLANT
CUFF TRNQT CYL 30X4X21-28X (TOURNIQUET CUFF) IMPLANT
DRAPE 3/4 80X56 (DRAPES) ×2 IMPLANT
DRSG DERMACEA 8X12 NADH (GAUZE/BANDAGES/DRESSINGS) ×2 IMPLANT
DRSG MEPILEX SACRM 8.7X9.8 (GAUZE/BANDAGES/DRESSINGS) ×2 IMPLANT
DRSG OPSITE POSTOP 4X14 (GAUZE/BANDAGES/DRESSINGS) ×2 IMPLANT
DRSG TEGADERM 4X4.75 (GAUZE/BANDAGES/DRESSINGS) ×2 IMPLANT
DURAPREP 26ML APPLICATOR (WOUND CARE) ×4 IMPLANT
ELECT REM PT RETURN 9FT ADLT (ELECTROSURGICAL) ×2
ELECTRODE REM PT RTRN 9FT ADLT (ELECTROSURGICAL) ×1 IMPLANT
EX-PIN ORTHOLOCK NAV 4X150 (PIN) ×4 IMPLANT
GLOVE BIO SURGEON STRL SZ7.5 (GLOVE) ×4 IMPLANT
GLOVE BIOGEL M STRL SZ7.5 (GLOVE) ×4 IMPLANT
GLOVE BIOGEL PI IND STRL 7.5 (GLOVE) ×1 IMPLANT
GLOVE BIOGEL PI INDICATOR 7.5 (GLOVE) ×1
GLOVE INDICATOR 8.0 STRL GRN (GLOVE) ×2 IMPLANT
GOWN STRL REUS W/ TWL LRG LVL3 (GOWN DISPOSABLE) ×2 IMPLANT
GOWN STRL REUS W/ TWL XL LVL3 (GOWN DISPOSABLE) ×1 IMPLANT
GOWN STRL REUS W/TWL LRG LVL3 (GOWN DISPOSABLE) ×4
GOWN STRL REUS W/TWL XL LVL3 (GOWN DISPOSABLE) ×2
HEMOVAC 400CC 10FR (MISCELLANEOUS) ×2 IMPLANT
HOLDER FOLEY CATH W/STRAP (MISCELLANEOUS) ×2 IMPLANT
HOOD PEEL AWAY FLYTE STAYCOOL (MISCELLANEOUS) ×4 IMPLANT
IRRIGATION SURGIPHOR STRL (IV SOLUTION) ×2 IMPLANT
KIT TURNOVER KIT A (KITS) ×2 IMPLANT
KNIFE SCULPS 14X20 (INSTRUMENTS) ×2 IMPLANT
LABEL OR SOLS (LABEL) ×2 IMPLANT
MANIFOLD NEPTUNE II (INSTRUMENTS) ×2 IMPLANT
NDL SAFETY ECLIPSE 18X1.5 (NEEDLE) ×1 IMPLANT
NDL SPNL 20GX3.5 QUINCKE YW (NEEDLE) ×2 IMPLANT
NEEDLE HYPO 18GX1.5 SHARP (NEEDLE) ×2
NEEDLE SPNL 20GX3.5 QUINCKE YW (NEEDLE) ×4 IMPLANT
NS IRRIG 500ML POUR BTL (IV SOLUTION) ×2 IMPLANT
PACK TOTAL KNEE (MISCELLANEOUS) ×2 IMPLANT
PAD WRAPON POLAR KNEE (MISCELLANEOUS) ×1 IMPLANT
PENCIL SMOKE EVACUATOR COATED (MISCELLANEOUS) ×2 IMPLANT
PENCIL SMOKE ULTRAEVAC 22 CON (MISCELLANEOUS) ×2 IMPLANT
PIN FIXATION 1/8DIA X 3INL (PIN) ×6 IMPLANT
PULSAVAC PLUS IRRIG FAN TIP (DISPOSABLE) ×2
SOL .9 NS 3000ML IRR  AL (IV SOLUTION) ×2
SOL .9 NS 3000ML IRR AL (IV SOLUTION) ×1
SOL .9 NS 3000ML IRR UROMATIC (IV SOLUTION) ×1 IMPLANT
SOL PREP PVP 2OZ (MISCELLANEOUS) ×2
SOLUTION PREP PVP 2OZ (MISCELLANEOUS) ×1 IMPLANT
SPONGE DRAIN TRACH 4X4 STRL 2S (GAUZE/BANDAGES/DRESSINGS) ×2 IMPLANT
STAPLER SKIN PROX 35W (STAPLE) ×2 IMPLANT
STOCKINETTE IMPERV 14X48 (MISCELLANEOUS) IMPLANT
STRAP TIBIA SHORT (MISCELLANEOUS) ×2 IMPLANT
SUCTION FRAZIER HANDLE 10FR (MISCELLANEOUS) ×2
SUCTION TUBE FRAZIER 10FR DISP (MISCELLANEOUS) ×1 IMPLANT
SUT VIC AB 0 CT1 36 (SUTURE) ×4 IMPLANT
SUT VIC AB 1 CT1 36 (SUTURE) ×4 IMPLANT
SUT VIC AB 2-0 CT2 27 (SUTURE) ×2 IMPLANT
SYR 20ML LL LF (SYRINGE) ×2 IMPLANT
SYR 30ML LL (SYRINGE) ×4 IMPLANT
TIBIAL BASE ROT PLAT SZ 8 KNEE (Knees) ×2 IMPLANT
TIP FAN IRRIG PULSAVAC PLUS (DISPOSABLE) ×1 IMPLANT
TOWEL OR 17X26 4PK STRL BLUE (TOWEL DISPOSABLE) ×2 IMPLANT
TOWER CARTRIDGE SMART MIX (DISPOSABLE) ×2 IMPLANT
TRAY FOLEY MTR SLVR 16FR STAT (SET/KITS/TRAYS/PACK) ×2 IMPLANT
WRAPON POLAR PAD KNEE (MISCELLANEOUS) ×2

## 2020-02-10 NOTE — Transfer of Care (Signed)
Immediate Anesthesia Transfer of Care Note  Patient: [name] DAY  Procedure(s) Performed: COMPUTER ASSISTED TOTAL KNEE ARTHROPLASTY - RNFA (Left Knee)  Patient Location: PACU  Anesthesia Type:Spinal  Level of Consciousness: awake, alert  and oriented  Airway & Oxygen Therapy: Patient Spontanous Breathing and Patient connected to face mask oxygen  Post-op Assessment: Report given to RN and Post -op Vital signs reviewed and stable  Post vital signs: Reviewed and stable  Last Vitals:  Vitals Value Taken Time  BP    Temp    Pulse    Resp    SpO2      Last Pain:  Vitals:   02/10/20 0622  TempSrc: Oral  PainSc: 2          Complications: No complications documented.

## 2020-02-10 NOTE — Evaluation (Signed)
Physical Therapy Evaluation Patient Details Name: [name] MRN: [id number] DOB: 10/22/1950 Today's Date: 02/10/2020   History of Present Illness  69yo male s/p L TKA 9/3, seen POD0.  Clinical Impression  Pt did well with POD0 PT session and was able to impressively complete all necessary tasks. HE had solid quad set, easily did SLRs, had AROM to >100 deg, was able to confidently ambulate >200 ft w/ consistent cadence and negotiated up/down steps w/o assist.  Good overall effort and session, pt safe to return home.    Follow Up Recommendations Home health PT    Equipment Recommendations  None recommended by PT    Recommendations for Other Services       Precautions / Restrictions Precautions Precautions: Fall Restrictions Weight Bearing Restrictions: Yes LLE Weight Bearing: Weight bearing as tolerated      Mobility  Bed Mobility Overal bed mobility: Independent             General bed mobility comments: easily gets to sitting EOB  Transfers Overall transfer level: Modified independent Equipment used: Rolling walker (2 wheeled)             General transfer comment: up/down 4x t/o session, able to rise with assist, cuing for UE use and set up  Ambulation/Gait Ambulation/Gait assistance: Modified independent (Device/Increase time) Gait Distance (Feet): 250 Feet Assistive device: Rolling walker (2 wheeled)       General Gait Details: Pt able to quickly assume consistent cadence and walker motion.  No hesitation with WBing and great first ambulation effort mechanics.  Stairs Stairs: Yes Stairs assistance: Modified independent (Device/Increase time) Stair Management: Two rails Number of Stairs: 4 General stair comments: easily negotiates up/down steps, educated on single step w/o rails - good understanding  Wheelchair Mobility    Modified Rankin (Stroke Patients Only)       Balance Overall balance assessment: Independent                                            Pertinent Vitals/Pain Pain Assessment: 0-10 Pain Score: 3  Pain Location: L knee (increases to 5-6/10 with activity) Pain Intervention(s): Limited activity within patient's tolerance;Monitored during session;Premedicated before session;Repositioned;Ice applied    Home Living Family/patient expects to be discharged to:: Private residence Living Arrangements: Spouse/significant other Available Help at Discharge: Available 24 hours/day Type of Home: House Home Access: Stairs to enter Entrance Stairs-Rails: None Entrance Stairs-Number of Steps: 1 Home Layout: One level Home Equipment: Shower seat - built in;Grab bars - tub/shower;Adaptive equipment;Walker - 2 wheels      Prior Function Level of Independence: Independent with assistive device(s)         Comments: Pt reports using walker for mobility 2/2 knee pain, indep with ADL tasks however low back pain limiting some IADL tasks     Hand Dominance        Extremity/Trunk Assessment   Upper Extremity Assessment Upper Extremity Assessment: Overall WFL for tasks assessed    Lower Extremity Assessment Lower Extremity Assessment: Overall WFL for tasks assessed LLE Deficits / Details: expected post-op strength/ROM deficits, SLRs w/o warm up, good QS       Communication   Communication: No difficulties  Cognition Arousal/Alertness: Awake/alert Behavior During Therapy: WFL for tasks assessed/performed Overall Cognitive Status: Within Functional Limits for tasks assessed  General Comments      Exercises Total Joint Exercises Ankle Circles/Pumps: AROM;10 reps Quad Sets: Strengthening;10 reps Short Arc Quad: Strengthening;10 reps;AROM Heel Slides: AROM;10 reps (with lightly resisted LE extensions) Hip ABduction/ADduction: Strengthening;10 reps Straight Leg Raises: AROM;10 reps Knee Flexion: PROM;5 reps Goniometric ROM:  0-101 AROM Other Exercises Other Exercises: Pt/spouse instructed in falls prevention, home/routines modifications, falls prevention strategies including pet care considerations, AE/DME for ADL; handout provided   Assessment/Plan    PT Assessment Patient needs continued PT services  PT Problem List Decreased strength;Decreased range of motion;Decreased activity tolerance;Decreased balance;Decreased mobility;Decreased coordination;Decreased knowledge of use of DME;Decreased safety awareness;Pain       PT Treatment Interventions DME instruction;Gait training;Stair training;Functional mobility training;Therapeutic activities;Therapeutic exercise;Balance training;Neuromuscular re-education;Patient/family education    PT Goals (Current goals can be found in the Care Plan section)  Acute Rehab PT Goals Patient Stated Goal: go home PT Goal Formulation: With patient Time For Goal Achievement: 02/24/20 Potential to Achieve Goals: Good    Frequency BID   Barriers to discharge        Co-evaluation               AM-PAC PT "6 Clicks" Mobility  Outcome Measure Help needed turning from your back to your side while in a flat bed without using bedrails?: None Help needed moving from lying on your back to sitting on the side of a flat bed without using bedrails?: None Help needed moving to and from a bed to a chair (including a wheelchair)?: None Help needed standing up from a chair using your arms (e.g., wheelchair or bedside chair)?: None Help needed to walk in hospital room?: None Help needed climbing 3-5 steps with a railing? : None 6 Click Score: 24    End of Session Equipment Utilized During Treatment: Gait belt Activity Tolerance: Patient tolerated treatment well Patient left: in chair;with family/visitor present Nurse Communication: Mobility status PT Visit Diagnosis: Muscle weakness (generalized) (M62.81);Difficulty in walking, not elsewhere classified (R26.2)    Time:  7846-9629 PT Time Calculation (min) (ACUTE ONLY): 48 min   Charges:   PT Evaluation $PT Eval Low Complexity: 1 Low PT Treatments $Gait Training: 8-22 mins $Therapeutic Exercise: 8-22 mins        Kreg Shropshire, DPT 02/10/2020, 5:52 PM

## 2020-02-10 NOTE — H&P (Signed)
The patient has been re-examined, and the chart reviewed, and there have been no interval changes to the documented history and physical.    The risks, benefits, and alternatives have been discussed at length. The patient expressed understanding of the risks benefits and agreed with plans for surgical intervention.  Natalie Mceuen P. Armany Mano, Jr. M.D.    

## 2020-02-10 NOTE — Anesthesia Preprocedure Evaluation (Signed)
Anesthesia Evaluation  Patient identified by MRN, date of birth, ID band Patient awake    Reviewed: Allergy & Precautions, NPO status , Patient's Chart, lab work & pertinent test results  History of Anesthesia Complications Negative for: history of anesthetic complications  Airway Mallampati: II  TM Distance: >3 FB Neck ROM: Full    Dental  (+) Poor Dentition   Pulmonary sleep apnea , neg COPD,    breath sounds clear to auscultation- rhonchi (-) wheezing      Cardiovascular hypertension, Pt. on medications (-) CAD, (-) Past MI, (-) Cardiac Stents and (-) CABG  Rhythm:Regular Rate:Normal - Systolic murmurs and - Diastolic murmurs    Neuro/Psych neg Seizures PSYCHIATRIC DISORDERS Anxiety Depression negative neurological ROS     GI/Hepatic GERD  ,(+) Hepatitis - (s/p treatment), C  Endo/Other  negative endocrine ROSneg diabetes  Renal/GU Renal disease: hx of nephrolithiasis.     Musculoskeletal  (+) Arthritis ,   Abdominal (+) + obese,   Peds  Hematology negative hematology ROS (+)   Anesthesia Other Findings Past Medical History: No date: Arthritis No date: Back pain     Comment:  MULTIPLE BACK SURGERIES 12/07/2014: Benign essential HTN     Comment:  Last Assessment & Plan:  Relevant Hx: Course: Daily               Update: Today's Plan: CONTROLLED ON MEDS 03/31/2013: Benign prostatic hyperplasia with urinary obstruction No date: Chronic back pain 03/31/2013: Chronic inflammation of tunica albuginea No date: Cirrhosis ([place]) 11/06/2014: DDD (degenerative disc disease), cervical 11/06/2014: DDD (degenerative disc disease), lumbar 04/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     Comment:  Overview:  severe in feet  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 Jacqlyn Larsen is his urologist.    Reproductive/Obstetrics                             Lab Results  Component Value Date   WBC 4.8 02/02/2020   HGB 17.2 (H) 02/02/2020   HCT 48.4 02/02/2020   MCV 94.5 02/02/2020   PLT 172 02/02/2020    Anesthesia Physical Anesthesia Plan  ASA: III  Anesthesia Plan: Spinal   Post-op Pain Management:    Induction:   PONV Risk Score and Plan: 1 and Propofol infusion  Airway Management Planned: Natural Airway  Additional Equipment:   Intra-op Plan:   Post-operative Plan:   Informed Consent: I have reviewed the patients History and Physical, chart, labs and discussed the procedure including the risks, benefits and alternatives for the proposed anesthesia with the patient or authorized representative who has indicated his/her understanding and acceptance.     Dental advisory given  Plan Discussed with: CRNA and Anesthesiologist  Anesthesia Plan Comments:         Anesthesia Quick Evaluation

## 2020-02-10 NOTE — Evaluation (Signed)
Occupational Therapy Evaluation Patient Details Name: [name] MRN: [id number] DOB: [birth year] Today's Date: 02/10/2020    History of Present Illness 69yo male POD0 L TKA, WBAT.   Clinical Impression   Pt seen for OT evaluation this date, POD#0 from above surgery. Pt was independent in all ADL prior to surgery, however occasionally using RW for mobility due to L knee pain. Pt lives with spouse who will be there to assist as needed 24/7. Pt is eager to return to PLOF with less pain and improved safety and independence. Pt currently requires PRN minimal assist for LB dressing and bathing while in seated position due to pain and limited AROM of L knee. Pt instructed in polar care mgt, falls prevention strategies, home/routines modifications, DME/AE for LB bathing and dressing tasks, and compression stocking mgt. Handout provided to support recall and carryover. Pt and spouse verbalize understanding, deny additional needs. Do not anticipate additional skilled OT needs. Will sign off.     Follow Up Recommendations  No OT follow up    Equipment Recommendations  None recommended by OT    Recommendations for Other Services       Precautions / Restrictions Precautions Precautions: Fall Restrictions Weight Bearing Restrictions: Yes LLE Weight Bearing: Weight bearing as tolerated      Mobility Bed Mobility                  Transfers                      Balance                                           ADL either performed or assessed with clinical judgement   ADL                                         General ADL Comments: Pt currently requires PRN Min A for LB ADL tasks, S-CGA for ADL transfers with RW; spouse able to provide needed level of assist     Vision         Perception     Praxis      Pertinent Vitals/Pain Pain Assessment: 0-10 Pain Score: 6  Pain Location: L knee Pain Intervention(s): Limited  activity within patient's tolerance;Monitored during session;Premedicated before session;Repositioned;Ice applied     Hand Dominance     Extremity/Trunk Assessment Upper Extremity Assessment Upper Extremity Assessment: Overall WFL for tasks assessed   Lower Extremity Assessment Lower Extremity Assessment: LLE deficits/detail LLE Deficits / Details: expected post-op strength/ROM deficits       Communication Communication Communication: No difficulties   Cognition Arousal/Alertness: Awake/alert Behavior During Therapy: WFL for tasks assessed/performed Overall Cognitive Status: Within Functional Limits for tasks assessed                                     General Comments       Exercises Other Exercises Other Exercises: Pt/spouse instructed in falls prevention, home/routines modifications, falls prevention strategies including pet care considerations, AE/DME for ADL; handout provided   Shoulder Instructions      Home Living Family/patient expects to be discharged to:: Private residence Living Arrangements: Spouse/significant other  Available Help at Discharge: Available 24 hours/day Type of Home: House             Bathroom Shower/Tub: Walk-in shower         Home Equipment: Shower seat - built in;Grab bars - tub/shower;Adaptive equipment Adaptive Equipment: Reacher;Long-handled sponge        Prior Functioning/Environment Level of Independence: Independent with assistive device(s)        Comments: Pt reports using walker for mobility 2/2 knee pain, indep with ADL tasks however low back pain limiting some IADL tasks        OT Problem List: Decreased strength;Decreased range of motion;Pain      OT Treatment/Interventions:      OT Goals(Current goals can be found in the care plan section) Acute Rehab OT Goals Patient Stated Goal: go home OT Goal Formulation: All assessment and education complete, DC therapy  OT Frequency:     Barriers to  D/C:            Co-evaluation              AM-PAC OT "6 Clicks" Daily Activity     Outcome Measure Help from another person eating meals?: None Help from another person taking care of personal grooming?: None Help from another person toileting, which includes using toliet, bedpan, or urinal?: A Little Help from another person bathing (including washing, rinsing, drying)?: A Little Help from another person to put on and taking off regular upper body clothing?: None Help from another person to put on and taking off regular lower body clothing?: A Little 6 Click Score: 21   End of Session    Activity Tolerance: Patient tolerated treatment well Patient left: in chair;with call bell/phone within reach;Other (comment);with family/visitor present (polar care and drain in place)  OT Visit Diagnosis: Other abnormalities of gait and mobility (R26.89);Pain Pain - Right/Left: Left Pain - part of body: Knee                Time: 1600-1620 OT Time Calculation (min): 20 min Charges:  OT General Charges $OT Visit: 1 Visit OT Evaluation $OT Eval Low Complexity: 1 Low OT Treatments $Self Care/Home Management : 8-22 mins  Jeni Salles, MPH, MS, OTR/L ascom [phone] 02/10/20, 4:34 PM

## 2020-02-10 NOTE — Op Note (Signed)
OPERATIVE NOTE  DATE OF SURGERY:  02/10/2020  PATIENT NAME:  [name]   DOB: 05/24/1951  MRN: [id number]  PRE-OPERATIVE DIAGNOSIS: Degenerative arthrosis of the left knee, primary  POST-OPERATIVE DIAGNOSIS:  Same  PROCEDURE:  Left total knee arthroplasty using computer-assisted navigation  SURGEON:  Marciano Sequin. M.D.  ANESTHESIA: spinal  ESTIMATED BLOOD LOSS: 50 mL  FLUIDS REPLACED: 800 mL of crystalloid  TOURNIQUET TIME: 104 minutes  DRAINS: Wound VAC  SOFT TISSUE RELEASES: Anterior cruciate ligament, posterior cruciate ligament, deep and superficial medial collateral ligament, patellofemoral ligament  IMPLANTS UTILIZED: DePuy Attune size 6 posterior stabilized femoral component (cemented), size 7 rotating platform tibial component (cemented), 41 mm medialized dome patella (cemented), and a 5 mm stabilized rotating platform polyethylene insert.  INDICATIONS FOR SURGERY: [name] is a 69 y.o. year old male with a long history of progressive knee pain. X-rays demonstrated severe degenerative changes in tricompartmental fashion. The patient had not seen any significant improvement despite conservative nonsurgical intervention. After discussion of the risks and benefits of surgical intervention, the patient expressed understanding of the risks benefits and agree with plans for total knee arthroplasty.   The risks, benefits, and alternatives were discussed at length including but not limited to the risks of infection, bleeding, nerve injury, stiffness, blood clots, the need for revision surgery, cardiopulmonary complications, among others, and they were willing to proceed.  PROCEDURE IN DETAIL: The patient was brought into the operating room and, after adequate spinal anesthesia was achieved, a tourniquet was placed on the patient's upper thigh. The patient's knee and leg were cleaned and prepped with alcohol and DuraPrep and draped in the usual sterile fashion. A  "timeout" was performed as per usual protocol. The lower extremity was exsanguinated using an Esmarch, and the tourniquet was inflated to 300 mmHg. An anterior longitudinal incision was made followed by a standard mid vastus approach. The deep fibers of the medial collateral ligament were elevated in a subperiosteal fashion off of the medial flare of the tibia so as to maintain a continuous soft tissue sleeve. The patella was subluxed laterally and the patellofemoral ligament was incised. Inspection of the knee demonstrated severe degenerative changes with full-thickness loss of articular cartilage. Osteophytes were debrided using a rongeur. Anterior and posterior cruciate ligaments were excised. Two 4.0 mm Schanz pins were inserted in the femur and into the tibia for attachment of the array of trackers used for computer-assisted navigation. Hip center was identified using a circumduction technique. Distal landmarks were mapped using the computer. The distal femur and proximal tibia were mapped using the computer. The distal femoral cutting guide was positioned using computer-assisted navigation so as to achieve a 5 distal valgus cut. The femur was sized and it was felt that a size 6 femoral component was appropriate. A size 6 femoral cutting guide was positioned and the anterior cut was performed and verified using the computer. This was followed by completion of the posterior and chamfer cuts. Femoral cutting guide for the central box was then positioned in the center box cut was performed.  Attention was then directed to the proximal tibia. Medial and lateral menisci were excised. The extramedullary tibial cutting guide was positioned using computer-assisted navigation so as to achieve a 0 varus-valgus alignment and 3 posterior slope. The cut was performed and verified using the computer. The proximal tibia was sized and it was felt that a size 7 tibial tray was appropriate. Tibial and femoral trials were  inserted followed  by insertion of a 5 mm polyethylene insert. The knee was felt to be tight medially. A Cobb elevator was used to elevate the superficial fibers of the medial collateral ligament.  This allowed for excellent mediolateral soft tissue balancing both in flexion and in full extension. Finally, the patella was cut and prepared so as to accommodate a 41 mm medialized dome patella. A patella trial was placed and the knee was placed through a range of motion with excellent patellar tracking appreciated. The femoral trial was removed after debridement of posterior osteophytes. The central post-hole for the tibial component was reamed followed by insertion of a keel punch. Tibial trials were then removed. Cut surfaces of bone were irrigated with copious amounts of normal saline using pulsatile lavage and then suctioned dry. Polymethylmethacrylate cement with gentamicin was prepared in the usual fashion using a vacuum mixer. Cement was applied to the cut surface of the proximal tibia as well as along the undersurface of a size 7 rotating platform tibial component. Tibial component was positioned and impacted into place. Excess cement was removed using [employer] fast streamer. Cement was then applied to the cut surfaces of the femur as well as along the posterior flanges of the size 6 femoral component. The femoral component was positioned and impacted into place. Excess cement was removed using [employer] fast streamer. A 5 mm polyethylene trial was inserted and the knee was brought into full extension with steady axial compression applied. Finally, cement was applied to the backside of a 41 mm medialized dome patella and the patellar component was positioned and patellar clamp applied. Excess cement was removed using [employer] fast streamer. After adequate curing of the cement, the tourniquet was deflated after a total tourniquet time of 104 minutes. Hemostasis was achieved using electrocautery. The knee was irrigated with copious  amounts of normal saline using pulsatile lavage followed by 500 ml of Surgiphor and then suctioned dry. 20 mL of 1.3% Exparel and 60 mL of 0.25% Marcaine in 40 mL of normal saline was injected along the posterior capsule, medial and lateral gutters, and along the arthrotomy site. A 5 mm stabilized rotating platform polyethylene insert was inserted and the knee was placed through a range of motion with excellent mediolateral soft tissue balancing appreciated and excellent patellar tracking noted. 2 medium drains were placed in the wound bed and brought out through separate stab incisions. The medial parapatellar portion of the incision was reapproximated using interrupted sutures of #1 Vicryl. Subcutaneous tissue was approximated in layers using first #0 Vicryl followed #2-0 Vicryl. The skin was approximated with skin staples. A sterile dressing was applied.  The patient tolerated the procedure well and was transported to the recovery room in stable condition.    Sheritta Deeg P. Holley Bouche., M.D.

## 2020-02-10 NOTE — OR Nursing (Addendum)
Pt advises he has rolling walker at home; also advises his toilet is elevated and does not want a bedside commode.  PT/OT eval complete.

## 2020-02-10 NOTE — OR Nursing (Addendum)
Ok to d/c pt to home without MD visit to postop per OR room/MD verbal as long as pt doesn't want to see MD prior to d/c.  Pt. Advises he doesn't need to see MD prior to him going home.  MD also advises 2nd dose of Ofirmev does not have to be given prior to discharge.

## 2020-02-10 NOTE — Anesthesia Procedure Notes (Signed)
Spinal  Patient location during procedure: OR Start time: 02/10/2020 7:28 AM End time: 02/10/2020 7:37 AM Staffing Performed: resident/CRNA  Anesthesiologist: Emmie Niemann, MD Resident/CRNA: Suresh Audi, Einar Grad, CRNA Other anesthesia staff: Markus Daft, RN Preanesthetic Checklist Completed: patient identified, IV checked, site marked, risks and benefits discussed, surgical consent, monitors and equipment checked, pre-op evaluation and timeout performed Spinal Block Patient position: sitting Prep: ChloraPrep Patient monitoring: heart rate, continuous pulse ox and blood pressure Approach: midline Location: L3-4 Injection technique: single-shot Needle Needle type: Introducer and Pencil-Tip  Needle gauge: 24 G Needle length: 9 cm Assessment Sensory level: T4

## 2020-02-10 NOTE — Discharge Instructions (Addendum)
Instructions after Total Knee Replacement   [name]., M.D.     Dept. of [facility]  [place], [place]  [address]  Phone: [phone]   Fax: [phone]    DIET: . Drink plenty of non-alcoholic fluids. . Resume your normal diet. Include foods high in fiber.  ACTIVITY:  . You may use crutches or a walker with weight-bearing as tolerated, unless instructed otherwise. . You may be weaned off of the walker or crutches by your Physical Therapist.  . Do NOT place pillows under the knee. Anything placed under the knee could limit your ability to straighten the knee.   . Continue doing gentle exercises. Exercising will reduce the pain and swelling, increase motion, and prevent muscle weakness.   . Please continue to use the TED compression stockings for 6 weeks. You may remove the stockings at night, but should reapply them in the morning. . Do not drive or operate any equipment until instructed.  WOUND CARE:  . Continue to use the PolarCare or ice packs periodically to reduce pain and swelling. . You may bathe or shower after the staples are removed at the first office visit following surgery.  MEDICATIONS: . Dennis Bast may resume your regular medications. . Please take the pain medication as prescribed on the medication. . Do not take pain medication on an empty stomach. . You have been given a prescription for a blood thinner (Lovenox or Coumadin). Please take the medication as instructed. (NOTE: After completing a 2 week course of Lovenox, take one Enteric-coated aspirin once a day. This along with elevation will help reduce the possibility of phlebitis in your operated leg.) . Do not drive or drink alcoholic beverages when taking pain medications.  CALL THE OFFICE FOR: . Temperature above 101 degrees . Excessive bleeding or drainage on the dressing. . Excessive swelling, coldness, or paleness of the toes. . Persistent  nausea and vomiting.  FOLLOW-UP:  . You should have an appointment to return to the office in 10-14 days after surgery. . Arrangements have been made for continuation of Physical Therapy (either home therapy or outpatient therapy).     [facility] Department Directory         [email or web address]       [email or web address]          Cardiology  Appointments: [place] [phone]  Endocrinology  Appointments: [place] [phone] [place] [phone]  Gastroenterology  Appointments: [place] [phone] [place] [phone]        General Surgery   Appointments: [facility]  Internal Medicine/Family Medicine  Appointments: [facility] [place] - [phone] [place] [phone]  Metabolic and [place] Loss Surgery  Appointments: [facility]        Neurology  Appointments: [place] [phone] [place] - [phone]  Neurosurgery  Appointments: [place]  Obstetrics & Gynecology  Appointments: [place] [phone] [place] - [phone]        Pediatrics  Appointments: [place] [phone] [place] - [phone]  Physiatry  Appointments: [place] [phone]  Physical Therapy  Appointments: [place] [phone]        Podiatry  Appointments: [place] [phone] [place] - [phone]  Pulmonology  Appointments: [place]  Rheumatology  Appointments: [place] [phone]        [place] Location: [facility]  [place],   [address]  [place] Location: [facility]  [address] [place], [place]  [address]  [place] Location: [facility] [address] [place], [place]  [address]      AMBULATORY SURGERY  DISCHARGE INSTRUCTIONS   1) The drugs that you were given will stay in your system until tomorrow so for  the next 24 hours you should not:  A) Drive an automobile B) Make any legal decisions C) Drink any alcoholic beverage   2) You may resume regular meals tomorrow.  Today it is better to start with liquids and gradually work up to solid foods.  You may eat anything you prefer, but it is better to start with liquids, then soup and crackers, and gradually work up to solid foods.   3) Please notify your doctor immediately if you have any unusual bleeding, trouble breathing, redness and pain at the surgery site, drainage, fever, or pain not relieved by medication.    4) Additional Instructions:        Please contact your physician with any problems or Same Day Surgery at [phone], Monday through Friday 6 am to 4 pm, or [place] [facility] Main number at [phone]. 

## 2020-02-11 ENCOUNTER — Encounter: Payer: Self-pay | Admitting: Orthopedic Surgery

## 2020-02-11 NOTE — Anesthesia Postprocedure Evaluation (Signed)
Anesthesia Post Note  Patient: [name]  Procedure(s) Performed: COMPUTER ASSISTED TOTAL KNEE ARTHROPLASTY - RNFA (Left Knee)  Patient location during evaluation: PACU Anesthesia Type: Spinal Level of consciousness: awake and alert and oriented Pain management: pain level controlled Vital Signs Assessment: post-procedure vital signs reviewed and stable Respiratory status: spontaneous breathing Cardiovascular status: blood pressure returned to baseline Anesthetic complications: no Comments: Patient discharged yesterday prior to todays visit.  No apparent anesthetic issues according to staff.   No complications documented.   Last Vitals:  Vitals:   02/10/20 1340 02/10/20 1648  BP: 118/84 (!) 160/88  Pulse: 76   Resp: 14 16  Temp: (!) 36.2 C (!) 36.3 C  SpO2: 94% 95%    Last Pain:  Vitals:   02/10/20 1648  TempSrc: Temporal  PainSc: 3                  Sela Falk

## 2020-02-29 DIAGNOSIS — Z96652 Presence of left artificial knee joint: Secondary | ICD-10-CM | POA: Insufficient documentation

## 2020-02-29 DIAGNOSIS — R413 Other amnesia: Secondary | ICD-10-CM | POA: Insufficient documentation

## 2020-03-08 DIAGNOSIS — E538 Deficiency of other specified B group vitamins: Secondary | ICD-10-CM | POA: Insufficient documentation

## 2020-03-08 DIAGNOSIS — E559 Vitamin D deficiency, unspecified: Secondary | ICD-10-CM | POA: Insufficient documentation

## 2020-03-08 DIAGNOSIS — D72819 Decreased white blood cell count, unspecified: Secondary | ICD-10-CM | POA: Insufficient documentation

## 2020-05-08 ENCOUNTER — Telehealth: Payer: Self-pay | Admitting: Internal Medicine

## 2020-05-08 ENCOUNTER — Encounter: Payer: Self-pay | Admitting: *Deleted

## 2020-05-08 ENCOUNTER — Other Ambulatory Visit: Payer: Self-pay | Admitting: Urology

## 2020-05-08 DIAGNOSIS — E291 Testicular hypofunction: Secondary | ICD-10-CM

## 2020-05-08 NOTE — Telephone Encounter (Signed)
05/08/2020  Pt called and said he had lab work done for his PCP recently and his hematocrit was high. He wanted to know if he should have his phlebotomy sooner due to this; he is currently scheduled for 08/08/2020.  SRW

## 2020-05-08 NOTE — Telephone Encounter (Signed)
Colette - Please schedule patient for phlebotomy.

## 2020-05-08 NOTE — Telephone Encounter (Signed)
Left vm for patient that he needs a phlebotomy set up. Colette, if infusion is unable to add him to the schedule this week, I can personally do the phlebotomy at 1:30pm this Thursday.

## 2020-05-08 NOTE — Telephone Encounter (Signed)
7 d ago    WBC (White Blood Cell Count) 4.1 - 10.2 10^3/uL 4.3      RBC (Red Blood Cell Count) 4.69 - 6.13 10^6/uL 5.42      Hemoglobin 14.1 - 18.1 gm/dL 18.1      Hematocrit 40.0 - 52.0 % 53.3 High       MCV (Mean Corpuscular Volume) 80.0 - 100.0 fl 98.3      MCH (Mean Corpuscular Hemoglobin) 27.0 - 31.2 pg 33.4 High       MCHC (Mean Corpuscular Hemoglobin Concentration) 32.0 - 36.0 gm/dL 34.0      Platelet Count 150 - 450 10^3/uL 197      RDW-CV (Red Cell Distribution Width) 11.6 - 14.8 % 12.3      MPV (Mean Platelet Volume) 9.4 - 12.4 fl 10.0      Neutrophils 1.50 - 7.80 10^3/uL 2.31      Lymphocytes 1.00 - 3.60 10^3/uL 1.09      Monocytes 0.00 - 1.50 10^3/uL 0.75      Eosinophils 0.00 - 0.55 10^3/uL 0.12      Basophils 0.00 - 0.09 10^3/uL 0.03      Neutrophil % 32.0 - 70.0 % 53.6      Lymphocyte % 10.0 - 50.0 % 25.3      Monocyte % 4.0 - 13.0 % 17.4 High       Eosinophil % 1.0 - 5.0 % 2.8      Basophil% 0.0 - 2.0 % 0.7      Immature Granulocyte % <=0.7 % 0.2      Immature Granulocyte Count <=0.06 10^3/L 0.01

## 2020-05-09 MED ORDER — TESTOSTERONE CYPIONATE 200 MG/ML IJ SOLN: 0.6000 mL | INTRAMUSCULAR | 0 refills | Status: DC

## 2020-05-09 NOTE — Telephone Encounter (Signed)
Spoke with patient. He is agreeable to come at 1:30 pm tomorrow for his phlebotomy

## 2020-05-10 ENCOUNTER — Inpatient Hospital Stay: Payer: Medicare Other | Attending: Internal Medicine

## 2020-05-10 VITALS — BP 138/83 | HR 67 | Temp 98.0°F

## 2020-05-10 DIAGNOSIS — D751 Secondary polycythemia: Secondary | ICD-10-CM | POA: Insufficient documentation

## 2020-05-10 DIAGNOSIS — Z79890 Hormone replacement therapy (postmenopausal): Secondary | ICD-10-CM | POA: Diagnosis not present

## 2020-05-10 NOTE — Progress Notes (Signed)
Patient presents to clinic for a therapeutic phlebotomy - 53.3 hct drawn at [facility] on 05/01/2020.

## 2020-07-17 ENCOUNTER — Other Ambulatory Visit: Payer: Self-pay

## 2020-07-17 ENCOUNTER — Other Ambulatory Visit: Payer: Medicare Other

## 2020-07-17 DIAGNOSIS — E291 Testicular hypofunction: Secondary | ICD-10-CM

## 2020-07-18 ENCOUNTER — Telehealth: Payer: Self-pay | Admitting: Urology

## 2020-07-18 LAB — TESTOSTERONE: Testosterone: 959 ng/dL — ABNORMAL HIGH (ref 264–916)

## 2020-07-18 NOTE — Telephone Encounter (Signed)
Testosterone level slightly elevated at 959.  When was his last injection in relation to this blood draw?

## 2020-07-24 NOTE — Telephone Encounter (Signed)
Pt calls triage line, LM during lunch stating he was returning a call.

## 2020-07-24 NOTE — Telephone Encounter (Signed)
That is higher than I would like to see for a midcycle testosterone level.  Just to make sure this is not a lab error would recommend repeating a midcycle testosterone level in 4-6 weeks

## 2020-07-24 NOTE — Telephone Encounter (Signed)
Notified patient as instructed.   

## 2020-07-24 NOTE — Telephone Encounter (Signed)
Patient states his last injection was on 07/10/2020. One week prior to labs

## 2020-08-08 ENCOUNTER — Inpatient Hospital Stay: Payer: Medicare Other

## 2020-08-08 ENCOUNTER — Encounter: Payer: Self-pay | Admitting: Internal Medicine

## 2020-08-08 ENCOUNTER — Inpatient Hospital Stay: Payer: Medicare Other | Attending: Internal Medicine

## 2020-08-08 ENCOUNTER — Inpatient Hospital Stay (HOSPITAL_BASED_OUTPATIENT_CLINIC_OR_DEPARTMENT_OTHER): Payer: Medicare Other | Admitting: Internal Medicine

## 2020-08-08 VITALS — BP 139/86 | HR 72

## 2020-08-08 DIAGNOSIS — D751 Secondary polycythemia: Secondary | ICD-10-CM | POA: Diagnosis present

## 2020-08-08 DIAGNOSIS — E291 Testicular hypofunction: Secondary | ICD-10-CM | POA: Insufficient documentation

## 2020-08-08 DIAGNOSIS — Z79890 Hormone replacement therapy (postmenopausal): Secondary | ICD-10-CM | POA: Insufficient documentation

## 2020-08-08 LAB — HEMATOCRIT: HCT: 49.7 % (ref 39.0–52.0)

## 2020-08-08 LAB — HEMOGLOBIN: Hemoglobin: 17.7 g/dL — ABNORMAL HIGH (ref 13.0–17.0)

## 2020-08-08 NOTE — Assessment & Plan Note (Addendum)
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   # Today the hematocrit is 49.7. Recommend phlebotomy if HCT > 50. Patient denies any improvement of his symptoms post phlebotomy. Proceed with phlebotomy today.   # Back pain-s/p surgery improved. STABLE.   # Hypogonadism- on testoterone- STABLE.  Recommend continuing testosterone given his history of hypogonadism.  # DISPOSITION: # proceed with Phlebotomy #  H&H in  6 months/possible phlebotomy; # Follow up  In 67 month with MD- H&H-Dr.B

## 2020-08-08 NOTE — Progress Notes (Signed)
HCT 49.7, HGB 17.7. Per Dr. Rogue Bussing, patient to have Phlebotomy 500cc today.

## 2020-08-08 NOTE — Progress Notes (Signed)
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   S/p TKA right- improved. Interim had phlebotomy.   Chronic mild fatigue.  Denies any worsening shortness of breath or cough.  Chronic back pain not any worse.  No headaches.  Review of Systems  Constitutional: Positive for malaise/fatigue. Negative for chills, diaphoresis, fever and weight loss.  HENT: Negative for nosebleeds and sore throat.   Eyes: Negative for double vision.  Respiratory: Negative for cough, hemoptysis, sputum production, shortness of breath and wheezing.   Cardiovascular: Negative for chest pain, palpitations, orthopnea and leg swelling.  Gastrointestinal: Negative for abdominal pain, blood in stool, constipation, diarrhea, heartburn, melena, nausea and vomiting.  Genitourinary: Negative for dysuria, frequency and urgency.  Musculoskeletal: Positive for back pain and joint pain.  Skin: Negative for itching.  Neurological: Negative for dizziness, tingling, focal weakness, weakness and headaches.  Endo/Heme/Allergies: Does not bruise/bleed easily.  Psychiatric/Behavioral: Negative for depression. The patient is not nervous/anxious and does not have insomnia.      PAST MEDICAL HISTORY :  Past Medical History:  Diagnosis Date  . Arthritis   . Back pain    MULTIPLE BACK SURGERIES  . Benign essential HTN 12/07/2014   Last Assessment & Plan:  Relevant Hx: Course: Daily Update: Today's Plan: CONTROLLED ON MEDS  . 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/31/2013  . Chronic back  pain   . 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 Cirrhosis ([place])   . DDD (degenerative disc disease), cervical 11/06/2014  . DDD (degenerative disc disease), lumbar 11/06/2014  . 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   Overview:  severe in feet   . 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 Erythrocytosis   . Esophagitis, reflux 12/07/2014  . GERD (gastroesophageal reflux disease)    had surgery  . HCV (hepatitis C virus) 12/07/2014  . Hepatitis C    DID HARVONI TREATMENT/ NO LONGER HEP C POSITIVE  . Idiopathic scoliosis and kyphoscoliosis 03/01/2013  . Neuropathy of both feet   . Painful legs and moving toes    DUE TO BACK PROBLEMS  . Prostatitis   . Sacroiliac joint dysfunction 11/06/2014  . Spinal stenosis 12/07/2014  . Testicular dysfunction 11/04/2011   Overview:  Dr. Jacqlyn Larsen is his urologist.     PAST SURGICAL HISTORY :   Past Surgical History:  Procedure Laterality Date  . BACK SURGERY  04/14/13   L2-S1  . 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 CATARACT EXTRACTION Left 1989  . CERVICAL SPINE SURGERY      X2 FUSION C4-7  . CHOLECYSTECTOMY  07/2013  . COLONOSCOPY WITH PROPOFOL N/A 12/18/2014   Procedure: COLONOSCOPY WITH PROPOFOL;  Surgeon: Lucilla Lame, MD;  Location: [facility];  Service: Endoscopy;  Laterality: N/A;  . ELBOW ARTHROSCOPY Right 02/2012  . ESOPHAGOGASTRODUODENOSCOPY (EGD) WITH PROPOFOL N/A 12/18/2014   Procedure: ESOPHAGOGASTRODUODENOSCOPY (EGD) WITH PROPOFOL with dialtion;  Surgeon: Lucilla Lame, MD;  Location: [facility];  Service: Endoscopy;  Laterality: N/A;  . [place]  BOTTLE CUT NOSE AND EYE, MULTIPLE SURGERIES  . FINGER SURGERY Left 2015   THUMB  . FINGER SURGERY Right 1966   MIDDLE  . FOOT SURGERY Right 1984  . FUNDIPLICATION  9417  . HERNIA REPAIR Left 1981/1957   RIGHT SIDE IN [phone]  . KNEE ARTHROPLASTY Left 02/10/2020   Procedure: COMPUTER ASSISTED TOTAL KNEE  ARTHROPLASTY - RNFA;  Surgeon: Dereck Leep, MD;  Location: [facility];  Service: Orthopedics;  Laterality: Left;  [name] Kitchen MASS EXCISION Left 02/16/2015   Procedure: LEFT BURSA ELBOW EXCISION;  Surgeon: Leanor Kail, MD;  Location: [facility];  Service: Orthopedics;  Laterality: Left;  . NISSEN FUNDOPLICATION  4481  . NOSE SURGERY  12/01/02   SEPTUM RECONSTRUCTION  . POLYPECTOMY  12/18/2014   Procedure: POLYPECTOMY;  Surgeon: Lucilla Lame, MD;  Location: [facility];  Service: Endoscopy;;  . SEPTOPLASTY  1999  . SHOULDER SURGERY Right 1983  . SHOULDER SURGERY Left 1979   WITH ELBOW (TENDON RELEASE)  . SKIN SURGERY  2004   SKIN CANCER NOSE    FAMILY HISTORY :   Family History  Problem Relation Age of Onset  . Arthritis Mother   . Asthma Mother   . Cancer Mother   . Heart disease Father     SOCIAL HISTORY:   Social History   Tobacco Use  . Smoking status: Never Smoker  . Smokeless tobacco: Never Used  Vaping Use  . Vaping Use: Never used  Substance Use Topics  . Alcohol use: Yes    Alcohol/week: 1.0 standard drink    Types: 1 Glasses of wine per week  . Drug use: No    ALLERGIES:  has No Known Allergies.  MEDICATIONS:  Current Outpatient Medications  Medication Sig Dispense Refill  . amLODipine (NORVASC) 10 MG tablet Take 10 mg by mouth daily.     . Brimonidine Tartrate (LUMIFY) 0.025 % SOLN Place 1 drop into both eyes daily as needed (redness).    . Cholecalciferol 50 MCG (2000 UT) CAPS Take 3 capsules daily for 3 months, then reduce to 1 capsule daily thereafter for Vitamin D Deficiency.    . citalopram (CELEXA) 20 MG tablet Take 20 mg by mouth daily.    . clonazePAM (KLONOPIN) 0.5 MG tablet Take 1 tablet by mouth 2 (two) times daily as needed for anxiety.     . fluticasone (FLONASE) 50 MCG/ACT nasal spray Place into both nostrils daily.    . furosemide (LASIX) 40 MG tablet Take 40 mg by mouth daily as needed for fluid.     . Multiple Vitamin  (MULTIVITAMIN WITH MINERALS) TABS tablet Take 1 tablet by mouth daily.    [name] Kitchen NEEDLE, DISP, 23 G 23G X 1" MISC Use 1 Units every 10 (ten) days.    [name] Kitchen spironolactone (ALDACTONE) 25 MG tablet Take 25 mg by mouth daily. AM    . tamsulosin (FLOMAX) 0.4 MG CAPS capsule Take 1 capsule (0.4 mg total) by mouth 2 (two) times daily. 180 capsule 3  . Testosterone Cypionate 200 MG/ML SOLN Inject 0.6 mLs into the muscle once a week. 10 mL 0  . traZODone (DESYREL) 50 MG tablet Take 50 mg by mouth at bedtime.     . vitamin B-12 (CYANOCOBALAMIN) 1000 MCG tablet      No current facility-administered medications for this visit.    PHYSICAL EXAMINATION:   BP 117/64 (BP Location: Left Arm, Patient Position: Sitting, Cuff Size: Large)   Pulse 71   Temp 99.3  F (37.4 C) (Tympanic)   Resp 16   Ht 5\' 9"  (1.753 m)   Wt 222 lb 9.6 oz (101 kg)   SpO2 96%   BMI 32.87 kg/m   Filed Weights   08/08/20 1324  Weight: 222 lb 9.6 oz (101 kg)    Physical Exam HENT:     Head: Normocephalic and atraumatic.     Mouth/Throat:     Pharynx: No oropharyngeal exudate.  Eyes:     Pupils: Pupils are equal, round, and reactive to light.  Cardiovascular:     Rate and Rhythm: Normal rate and regular rhythm.  Pulmonary:     Effort: No respiratory distress.     Breath sounds: No wheezing.  Abdominal:     General: Bowel sounds are normal. There is no distension.     Palpations: Abdomen is soft. There is no mass.     Tenderness: There is no abdominal tenderness. There is no guarding or rebound.  Musculoskeletal:        General: No tenderness. Normal range of motion.     Cervical back: Normal range of motion and neck supple.  Skin:    General: Skin is warm.     Comments: Erythematous rash noted on the face/skin ointment for precancerous skin lesions.  Neurological:     Mental Status: He is alert and oriented to person, place, and time.  Psychiatric:        Mood and Affect: Affect normal.     LABORATORY DATA:  I  have reviewed the data as listed    Component Value Date/Time   NA 138 02/02/2020 1202   NA 137 02/22/2012 0645   K 4.0 02/02/2020 1202   K 4.6 02/22/2012 0645   CL 103 02/02/2020 1202   CL 103 02/22/2012 0645   CO2 24 02/02/2020 1202   CO2 30 02/22/2012 0645   GLUCOSE 94 02/02/2020 1202   GLUCOSE 90 02/22/2012 0645   BUN 18 02/02/2020 1202   BUN 9 02/22/2012 0645   CREATININE 0.87 02/02/2020 1202   CREATININE 0.97 02/22/2012 0645   CALCIUM 8.9 02/02/2020 1202   CALCIUM 8.5 02/22/2012 0645   PROT 7.5 02/02/2020 1202   PROT 6.9 02/22/2012 0645   ALBUMIN 4.4 02/02/2020 1202   ALBUMIN 3.1 (L) 02/22/2012 0645   AST 38 02/02/2020 1202   AST 80 (H) 02/22/2012 0645   ALT 32 02/02/2020 1202   ALT 96 (H) 02/22/2012 0645   ALKPHOS 34 (L) 02/02/2020 1202   ALKPHOS 47 (L) 02/22/2012 0645   BILITOT 1.0 02/02/2020 1202   BILITOT 0.3 02/22/2012 0645   GFRNONAA >60 02/02/2020 1202   GFRNONAA >60 02/22/2012 0645   GFRAA >60 02/02/2020 1202   GFRAA >60 02/22/2012 0645    No results found for: SPEP, UPEP  Lab Results  Component Value Date   WBC 4.8 02/02/2020   NEUTROABS 2.8 04/18/2016   HGB 17.7 (H) 08/08/2020   HCT 49.7 08/08/2020   MCV 94.5 02/02/2020   PLT 172 02/02/2020      Chemistry      Component Value Date/Time   NA 138 02/02/2020 1202   NA 137 02/22/2012 0645   K 4.0 02/02/2020 1202   K 4.6 02/22/2012 0645   CL 103 02/02/2020 1202   CL 103 02/22/2012 0645   CO2 24 02/02/2020 1202   CO2 30 02/22/2012 0645   BUN 18 02/02/2020 1202   BUN 9 02/22/2012 0645   CREATININE 0.87 02/02/2020 1202   CREATININE  0.97 02/22/2012 0645      Component Value Date/Time   CALCIUM 8.9 02/02/2020 1202   CALCIUM 8.5 02/22/2012 0645   ALKPHOS 34 (L) 02/02/2020 1202   ALKPHOS 47 (L) 02/22/2012 0645   AST 38 02/02/2020 1202   AST 80 (H) 02/22/2012 0645   ALT 32 02/02/2020 1202   ALT 96 (H) 02/22/2012 0645   BILITOT 1.0 02/02/2020 1202   BILITOT 0.3 02/22/2012 0645         ASSESSMENT & PLAN:   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   # Today the hematocrit is 49.7. Recommend phlebotomy if HCT > 50. Patient denies any improvement of his symptoms post phlebotomy. Proceed with phlebotomy today.   # Back pain-s/p surgery improved. STABLE.   # Hypogonadism- on testoterone- STABLE.  Recommend continuing testosterone given his history of hypogonadism.  # DISPOSITION: # proceed with Phlebotomy #  H&H in  6 months/possible phlebotomy; # Follow up  In 2 month with MD- H&H-Dr.B     Cammie Sickle, MD 08/08/2020 1:55 PM

## 2020-08-20 ENCOUNTER — Other Ambulatory Visit: Payer: Self-pay

## 2020-08-20 DIAGNOSIS — E291 Testicular hypofunction: Secondary | ICD-10-CM

## 2020-08-21 ENCOUNTER — Other Ambulatory Visit: Payer: Self-pay

## 2020-08-21 ENCOUNTER — Other Ambulatory Visit: Payer: Medicare Other

## 2020-08-21 DIAGNOSIS — E291 Testicular hypofunction: Secondary | ICD-10-CM

## 2020-08-22 ENCOUNTER — Telehealth: Payer: Self-pay | Admitting: *Deleted

## 2020-08-22 LAB — TESTOSTERONE: Testosterone: 811 ng/dL (ref 264–916)

## 2020-08-22 NOTE — Telephone Encounter (Signed)
-----   Message from Abbie Sons, MD sent at 08/22/2020  7:29 AM EDT ----- Repeat testosterone level was 811.  Continue present dose and follow-up as scheduled

## 2020-08-23 NOTE — Telephone Encounter (Signed)
Pt returned call, informed pt of the information below. Pt gave verbal understanding.

## 2020-08-29 ENCOUNTER — Ambulatory Visit (INDEPENDENT_AMBULATORY_CARE_PROVIDER_SITE_OTHER): Payer: Medicare Other | Admitting: Dermatology

## 2020-08-29 ENCOUNTER — Encounter: Payer: Self-pay | Admitting: Dermatology

## 2020-08-29 ENCOUNTER — Other Ambulatory Visit: Payer: Self-pay

## 2020-08-29 DIAGNOSIS — L578 Other skin changes due to chronic exposure to nonionizing radiation: Secondary | ICD-10-CM | POA: Diagnosis not present

## 2020-08-29 DIAGNOSIS — L570 Actinic keratosis: Secondary | ICD-10-CM

## 2020-08-29 DIAGNOSIS — C44212 Basal cell carcinoma of skin of right ear and external auricular canal: Secondary | ICD-10-CM | POA: Diagnosis not present

## 2020-08-29 DIAGNOSIS — L820 Inflamed seborrheic keratosis: Secondary | ICD-10-CM

## 2020-08-29 DIAGNOSIS — D492 Neoplasm of unspecified behavior of bone, soft tissue, and skin: Secondary | ICD-10-CM

## 2020-08-29 DIAGNOSIS — C44712 Basal cell carcinoma of skin of right lower limb, including hip: Secondary | ICD-10-CM

## 2020-08-29 NOTE — Patient Instructions (Addendum)
If you have any questions or concerns for your doctor, please call our main line at [phone] and press option 4 to reach your doctor's medical assistant. If no one answers, please leave a voicemail as directed and we will return your call as soon as possible. Messages left after 4 pm will be answered the following business day.   You may also send us a message via MyChart. We typically respond to MyChart messages within 1-2 business days.  For prescription refills, please ask your pharmacy to contact our office. Our fax number is [phone].  If you have an urgent issue when the clinic is closed that cannot wait until the next business day, you can page your doctor at the number below.    Please note that while we do our best to be available for urgent issues outside of office hours, we are not available 24/7.   If you have an urgent issue and are unable to reach us, you may choose to seek medical care at your doctor's office, retail clinic, urgent care center, or emergency room.  If you have a medical emergency, please immediately call 911 or go to the emergency department.  Pager Numbers  - Dr. Kowalski: [phone]  - Dr. Moye: [phone]  - Dr. Stewart: [phone]  In the event of inclement weather, please call our main line at [phone] for an update on the status of any delays or closures.  Dermatology Medication Tips: Please keep the boxes that topical medications come in in order to help keep track of the instructions about where and how to use these. Pharmacies typically print the medication instructions only on the boxes and not directly on the medication tubes.   If your medication is too expensive, please contact our office at [phone] option 4 or send us a message through MyChart.   We are unable to tell what your co-pay for medications will be in advance as this is different depending on your insurance coverage. However, we may be able to find a  substitute medication at lower cost or fill out paperwork to get insurance to cover a needed medication.   If a prior authorization is required to get your medication covered by your insurance company, please allow us 1-2 business days to complete this process.  Drug prices often vary depending on where the prescription is filled and some pharmacies may offer cheaper prices.  The website www.goodrx.com contains coupons for medications through different pharmacies. The prices here do not account for what the cost may be with help from insurance (it may be cheaper with your insurance), but the website can give you the price if you did not use any insurance.  - You can print the associated coupon and take it with your prescription to the pharmacy.  - You may also stop by our office during regular business hours and pick up a GoodRx coupon card.  - If you need your prescription sent electronically to a different pharmacy, notify our office through [facility] MyChart or by phone at [phone] option 4.     Wound Care Instructions  1. Cleanse wound gently with soap and water once a day then pat dry with clean gauze. Apply a thing coat of Petrolatum (petroleum jelly, "Vaseline") over the wound (unless you have an allergy to this). We recommend that you use a new, sterile tube of Vaseline. Do not pick or remove scabs. Do not remove the yellow or white "healing tissue" from the base of the wound.    Cover the wound with fresh, clean, nonstick gauze and secure with paper tape. You may use Band-Aids in place of gauze and tape if the would is small enough, but would recommend trimming much of the tape off as there is often too much. Sometimes Band-Aids can irritate the skin.  3. You should call the office for your biopsy report after 1 week if you have not already been contacted.  4. If you experience any problems, such as abnormal amounts of bleeding, swelling, significant bruising, significant pain, or evidence of  infection, please call the office immediately.  5. FOR ADULT SURGERY PATIENTS: If you need something for pain relief you may take 1 extra strength Tylenol (acetaminophen) AND 2 Ibuprofen (200mg  each) together every 4 hours as needed for pain. (do not take these if you are allergic to them or if you have a reason you should not take them.) Typically, you may only need pain medication for 1 to 3 days.     Wound Care Instructions  6. Cleanse wound gently with soap and water once a day then pat dry with clean gauze. Apply a thing coat of Petrolatum (petroleum jelly, "Vaseline") over the wound (unless you have an allergy to this). We recommend that you use a new, sterile tube of Vaseline. Do not pick or remove scabs. Do not remove the yellow or white "healing tissue" from the base of the wound.  7. Cover the wound with fresh, clean, nonstick gauze and secure with paper tape. You may use Band-Aids in place of gauze and tape if the would is small enough, but would recommend trimming much of the tape off as there is often too much. Sometimes Band-Aids can irritate the skin.  8. You should call the office for your biopsy report after 1 week if you have not already been contacted.  9. If you experience any problems, such as abnormal amounts of bleeding, swelling, significant bruising, significant pain, or evidence of infection, please call the office immediately.  10. FOR ADULT SURGERY PATIENTS: If you need something for pain relief you may take 1 extra strength Tylenol (acetaminophen) AND 2 Ibuprofen (200mg  each) together every 4 hours as needed for pain. (do not take these if you are allergic to them or if you have a reason you should not take them.) Typically, you may only need pain medication for 1 to 3 days.

## 2020-08-29 NOTE — Progress Notes (Signed)
Follow-Up Visit   Subjective  [name] is a 70 y.o. male who presents for the following: check spot (R post ear, ~28yr, past 82m started growing, irritating) and check spots (R pretibia, back).  The following portions of the chart were reviewed this encounter and updated as appropriate:   Tobacco  Allergies  Meds  Problems  Med Hx  Surg Hx  Fam Hx     Review of Systems:  No other skin or systemic complaints except as noted in HPI or Assessment and Plan.  Objective  Well appearing patient in no apparent distress; mood and affect are within normal limits.  A focused examination was performed including R ear, face, R leg, back. Relevant physical exam findings are noted in the Assessment and Plan.  Objective  Right Posterior ear: Pearly pap 1.2cm     Objective  R prox medial pretibial: Crusted pap 0.6 x 0.9cm  Objective  ears, face x 12 (12): Pink scaly macules   Objective  Left Eyebrow x 1, Left Upper Back at L arm of cross tattoo x 1 (2): Erythematous keratotic or waxy stuck-on papule or plaque.   Images       Assessment & Plan    Actinic Damage - chronic, secondary to cumulative UV radiation exposure/sun exposure over time - diffuse scaly erythematous macules with underlying dyspigmentation - Recommend daily broad spectrum sunscreen SPF 30+ to sun-exposed areas, reapply every 2 hours as needed.  - Recommend staying in the shade or wearing long sleeves, sun glasses (UVA+UVB protection) and wide brim hats (4-inch brim around the entire circumference of the hat). - Call for new or changing lesions.  Neoplasm of skin (2) Right Posterior ear  Epidermal / dermal shaving  Lesion diameter (cm):  1.2 Informed consent: discussed and consent obtained   Timeout: patient name, date of birth, surgical site, and procedure verified   Procedure prep:  Patient was prepped and draped in usual sterile fashion Prep type:  Isopropyl alcohol Anesthesia: the  lesion was anesthetized in a standard fashion   Anesthetic:  1% lidocaine w/ epinephrine 1-100,000 buffered w/ 8.4% NaHCO3 Instrument used: flexible razor blade   Hemostasis achieved with: pressure, aluminum chloride and electrodesiccation   Outcome: patient tolerated procedure well   Post-procedure details: sterile dressing applied and wound care instructions given   Dressing type: bandage and petrolatum    Destruction of lesion Complexity: extensive   Destruction method: electrodesiccation and curettage   Informed consent: discussed and consent obtained   Timeout:  patient name, date of birth, surgical site, and procedure verified Procedure prep:  Patient was prepped and draped in usual sterile fashion Prep type:  Isopropyl alcohol Anesthesia: the lesion was anesthetized in a standard fashion   Anesthetic:  1% lidocaine w/ epinephrine 1-100,000 buffered w/ 8.4% NaHCO3 Curettage performed in three different directions: Yes   Electrodesiccation performed over the curetted area: Yes   Lesion length (cm):  1.2 Lesion width (cm):  1.2 Margin per side (cm):  0.2 Final wound size (cm):  1.6 Hemostasis achieved with:  pressure, aluminum chloride and electrodesiccation Outcome: patient tolerated procedure well with no complications   Post-procedure details: sterile dressing applied and wound care instructions given   Dressing type: bandage and petrolatum    Specimen 1 - Surgical pathology Differential Diagnosis: D48.5 R/O BCC  Check Margins: No Pearly pap 1.2 EDC today  R prox medial pretibial  Epidermal / dermal shaving  Lesion diameter (cm):  0.9 Informed consent: discussed and  consent obtained   Timeout: patient name, date of birth, surgical site, and procedure verified   Procedure prep:  Patient was prepped and draped in usual sterile fashion Prep type:  Isopropyl alcohol Anesthesia: the lesion was anesthetized in a standard fashion   Anesthetic:  1% lidocaine w/ epinephrine  1-100,000 buffered w/ 8.4% NaHCO3 Instrument used: flexible razor blade   Hemostasis achieved with: pressure, aluminum chloride and electrodesiccation   Outcome: patient tolerated procedure well   Post-procedure details: sterile dressing applied and wound care instructions given   Dressing type: bandage and petrolatum    Destruction of lesion Complexity: extensive   Destruction method: electrodesiccation and curettage   Informed consent: discussed and consent obtained   Timeout:  patient name, date of birth, surgical site, and procedure verified Procedure prep:  Patient was prepped and draped in usual sterile fashion Prep type:  Isopropyl alcohol Anesthesia: the lesion was anesthetized in a standard fashion   Anesthetic:  1% lidocaine w/ epinephrine 1-100,000 buffered w/ 8.4% NaHCO3 Curettage performed in three different directions: Yes   Electrodesiccation performed over the curetted area: Yes   Lesion length (cm):  0.9 Lesion width (cm):  0.6 Margin per side (cm):  0.2 Final wound size (cm):  1.3 Hemostasis achieved with:  pressure, aluminum chloride and electrodesiccation Outcome: patient tolerated procedure well with no complications   Post-procedure details: sterile dressing applied and wound care instructions given   Dressing type: bandage and petrolatum    Specimen 2 - Surgical pathology Differential Diagnosis: D48.5 R/O BCC  Check Margins: No Crusted pap 0.6 x 0.9cm EDC  AK (actinic keratosis) (12) ears, face x 12  Destruction of lesion - ears, face x 12 Complexity: simple   Destruction method: cryotherapy   Informed consent: discussed and consent obtained   Timeout:  patient name, date of birth, surgical site, and procedure verified Lesion destroyed using liquid nitrogen: Yes   Region frozen until ice ball extended beyond lesion: Yes   Outcome: patient tolerated procedure well with no complications   Post-procedure details: wound care instructions given     Inflamed seborrheic keratosis (2) Left Eyebrow x 1, Left Upper Back at L arm of cross tattoo x 1  Destruction of lesion - Left Eyebrow x 1, Left Upper Back at L arm of cross tattoo x 1 Complexity: simple   Destruction method: cryotherapy   Informed consent: discussed and consent obtained   Timeout:  patient name, date of birth, surgical site, and procedure verified Lesion destroyed using liquid nitrogen: Yes   Region frozen until ice ball extended beyond lesion: Yes   Outcome: patient tolerated procedure well with no complications   Post-procedure details: wound care instructions given    Return in about 4 months (around 12/29/2020) for UBSE, Hx of BCC, Hx of AKs, Hx of SCC.   I, Othelia Pulling, RMA, am acting as scribe for Sarina Ser, MD . Documentation: I have reviewed the above documentation for accuracy and completeness, and I agree with the above.  Sarina Ser, MD

## 2020-09-03 ENCOUNTER — Telehealth: Payer: Self-pay

## 2020-09-03 NOTE — Telephone Encounter (Signed)
-----   Message from Ralene Bathe, MD sent at 09/03/2020 10:24 AM EDT ----- Diagnosis 1. Skin , right post ear, skin BASAL CELL CARCINOMA, NODULAR PATTERN, BASE INVOLVED 2. Skin , right prox med pretibial, shave BASAL CELL CARCINOMA, NODULAR PATTERN, BASE INVOLVED  1&2 - both cancer - BCC Both already treated Recheck next visit Keep follow up appt

## 2020-09-03 NOTE — Telephone Encounter (Signed)
Discussed biopsy results with pt  °

## 2020-09-04 ENCOUNTER — Telehealth: Payer: Self-pay | Admitting: *Deleted

## 2020-09-04 ENCOUNTER — Encounter: Payer: Self-pay | Admitting: Dermatology

## 2020-09-04 ENCOUNTER — Inpatient Hospital Stay: Payer: Medicare Other

## 2020-09-04 DIAGNOSIS — D751 Secondary polycythemia: Secondary | ICD-10-CM

## 2020-09-04 LAB — HEMOGLOBIN: Hemoglobin: 17.2 g/dL — ABNORMAL HIGH (ref 13.0–17.0)

## 2020-09-04 LAB — HEMATOCRIT: HCT: 49.4 % (ref 39.0–52.0)

## 2020-09-04 NOTE — Telephone Encounter (Signed)
Patient called reporting that he had phlebotomies in December and again 08/08/20, and that he went to see his PCP 08/28/20 and his HCT is back up to 53.1 already. He is asking if he needs to come in for Phlebotomy again and if he needs to be evaluated to find out why his HCT is going up so fast when he was only having to have it done every 6 months. Please advise

## 2020-09-04 NOTE — Telephone Encounter (Signed)
Spoke with pt in regards. Per. Dr B pt can come in for repeat H&H and we will go from there on what to do next based on results. Pt will be in today at 1:30 for repeat labs.

## 2020-09-06 ENCOUNTER — Telehealth: Payer: Self-pay | Admitting: Internal Medicine

## 2020-09-06 NOTE — Telephone Encounter (Signed)
On 3/31- I spoke to pt re: his repeat H&H fairly stable. Pt does not need any phlebotomy. Follow up as planned.   GB

## 2020-09-27 ENCOUNTER — Other Ambulatory Visit: Payer: Self-pay | Admitting: Urology

## 2020-09-27 DIAGNOSIS — E291 Testicular hypofunction: Secondary | ICD-10-CM

## 2020-10-08 ENCOUNTER — Ambulatory Visit: Payer: Medicare Other | Admitting: Dermatology

## 2020-10-18 DIAGNOSIS — G894 Chronic pain syndrome: Secondary | ICD-10-CM | POA: Insufficient documentation

## 2020-11-12 ENCOUNTER — Other Ambulatory Visit: Payer: Self-pay | Admitting: *Deleted

## 2020-11-12 DIAGNOSIS — E291 Testicular hypofunction: Secondary | ICD-10-CM

## 2020-11-12 DIAGNOSIS — N401 Enlarged prostate with lower urinary tract symptoms: Secondary | ICD-10-CM

## 2020-12-27 ENCOUNTER — Other Ambulatory Visit: Payer: Self-pay

## 2020-12-27 ENCOUNTER — Ambulatory Visit (INDEPENDENT_AMBULATORY_CARE_PROVIDER_SITE_OTHER): Payer: Medicare Other | Admitting: Dermatology

## 2020-12-27 DIAGNOSIS — L570 Actinic keratosis: Secondary | ICD-10-CM

## 2020-12-27 DIAGNOSIS — D492 Neoplasm of unspecified behavior of bone, soft tissue, and skin: Secondary | ICD-10-CM

## 2020-12-27 DIAGNOSIS — C44519 Basal cell carcinoma of skin of other part of trunk: Secondary | ICD-10-CM | POA: Diagnosis not present

## 2020-12-27 DIAGNOSIS — C4491 Basal cell carcinoma of skin, unspecified: Secondary | ICD-10-CM

## 2020-12-27 DIAGNOSIS — Z1283 Encounter for screening for malignant neoplasm of skin: Secondary | ICD-10-CM

## 2020-12-27 DIAGNOSIS — L578 Other skin changes due to chronic exposure to nonionizing radiation: Secondary | ICD-10-CM

## 2020-12-27 DIAGNOSIS — D229 Melanocytic nevi, unspecified: Secondary | ICD-10-CM

## 2020-12-27 DIAGNOSIS — D1800 Hemangioma unspecified site: Secondary | ICD-10-CM

## 2020-12-27 DIAGNOSIS — L821 Other seborrheic keratosis: Secondary | ICD-10-CM

## 2020-12-27 DIAGNOSIS — Z85828 Personal history of other malignant neoplasm of skin: Secondary | ICD-10-CM

## 2020-12-27 DIAGNOSIS — L814 Other melanin hyperpigmentation: Secondary | ICD-10-CM

## 2020-12-27 DIAGNOSIS — L820 Inflamed seborrheic keratosis: Secondary | ICD-10-CM

## 2020-12-27 HISTORY — DX: Basal cell carcinoma of skin, unspecified: C44.91

## 2020-12-27 NOTE — Progress Notes (Signed)
Follow-Up Visit   Subjective  [name] is a 70 y.o. male who presents for the following: Upper body skin exam (Hx of BCC, SCC, AKs). The patient presents for Upper Body Skin Exam (UBSE) for skin cancer screening and mole check.  The following portions of the chart were reviewed this encounter and updated as appropriate:   Tobacco  Allergies  Meds  Problems  Med Hx  Surg Hx  Fam Hx     Review of Systems:  No other skin or systemic complaints except as noted in HPI or Assessment and Plan.  Objective  Well appearing patient in no apparent distress; mood and affect are within normal limits.  All skin waist up examined.  multiple Well healed scars with no evidence of recurrence.   multiple Well healed scars with no evidence of recurrence, no lymphadenopathy.   face x 3, R super sternal notch x 1, L ant and L lat deltoid x 4 (8) Pink scaly macules        R clavicle/shoulder x 4, Total = 4 (4) Erythematous keratotic or waxy stuck-on papule or plaque.   R epigastric Pink patch 1.2cm   Assessment & Plan   Lentigines - Scattered tan macules - Due to sun exposure - Benign-appering, observe - Recommend daily broad spectrum sunscreen SPF 30+ to sun-exposed areas, reapply every 2 hours as needed. - Call for any changes  Seborrheic Keratoses - Stuck-on, waxy, tan-brown papules and/or plaques  - Benign-appearing - Discussed benign etiology and prognosis. - Observe - Call for any changes  Melanocytic Nevi - Tan-brown and/or pink-flesh-colored symmetric macules and papules - Benign appearing on exam today - Observation - Call clinic for new or changing moles - Recommend daily use of broad spectrum spf 30+ sunscreen to sun-exposed areas.   Hemangiomas - Red papules - Discussed benign nature - Observe - Call for any changes  Actinic Damage - Severe, confluent actinic changes with pre-cancerous actinic keratoses  - Severe, chronic, not at goal,  secondary to cumulative UV radiation exposure over time - diffuse scaly erythematous macules and papules with underlying dyspigmentation - Discussed Prescription "Field Treatment" for Severe, Chronic Confluent Actinic Changes with Pre-Cancerous Actinic Keratoses Field treatment involves treatment of an entire area of skin that has confluent Actinic Changes (Sun/ Ultraviolet light damage) and PreCancerous Actinic Keratoses by method of PhotoDynamic Therapy (PDT) and/or prescription Topical Chemotherapy agents such as 5-fluorouracil, 5-fluorouracil/calcipotriene, and/or imiquimod.  The purpose is to decrease the number of clinically evident and subclinical PreCancerous lesions to prevent progression to development of skin cancer by chemically destroying early precancer changes that may or may not be visible.  It has been shown to reduce the risk of developing skin cancer in the treated area. As a result of treatment, redness, scaling, crusting, and open sores may occur during treatment course. One or more than one of these methods may be used and may have to be used several times to control, suppress and eliminate the PreCancerous changes. Discussed treatment course, expected reaction, and possible side effects. - Recommend daily broad spectrum sunscreen SPF 30+ to sun-exposed areas, reapply every 2 hours as needed.  - Staying in the shade or wearing long sleeves, sun glasses (UVA+UVB protection) and wide brim hats (4-inch brim around the entire circumference of the hat) are also recommended. - Call for new or changing lesions.  - - Start 5-fluorouracil/calcipotriene cream twice a day for 7 days to affected areas including forehead, temples, nose. Prescription sent to [facility]. Patient advised they will receive an email to purchase the medication online and have it sent to their home. Patient provided with handout reviewing treatment course and side effects and advised to call or  message [place] on MyChart with any concerns.   Skin cancer screening performed today.  History of basal cell carcinoma (BCC) multiple  Clear. Observe for recurrence. Call clinic for new or changing lesions.  Recommend regular skin exams, daily broad-spectrum spf 30+ sunscreen use, and photoprotection.    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 Observe for recurrence. Call clinic for new or changing lesions.  Recommend regular skin exams, daily broad-spectrum spf 30+ sunscreen use, and photoprotection.    AK (actinic keratosis) (8) face x 3, R super sternal notch x 1, L ant and L lat deltoid x 4  Recheck L ant and L lat deltoid x 4  Destruction of lesion - face x 3, R super sternal notch x 1, L ant and L lat deltoid x 4 Complexity: simple   Destruction method: cryotherapy   Informed consent: discussed and consent obtained   Timeout:  patient name, date of birth, surgical site, and procedure verified Lesion destroyed using liquid nitrogen: Yes   Region frozen until ice ball extended beyond lesion: Yes   Outcome: patient tolerated procedure well with no complications   Post-procedure details: wound care instructions given    Inflamed seborrheic keratosis R clavicle/shoulder x 4, Total = 4  Destruction of lesion - R clavicle/shoulder x 4, Total = 4 Complexity: simple   Destruction method: cryotherapy   Informed consent: discussed and consent obtained   Timeout:  patient name, date of birth, surgical site, and procedure verified Lesion destroyed using liquid nitrogen: Yes   Region frozen until ice ball extended beyond lesion: Yes   Outcome: patient tolerated procedure well with no complications   Post-procedure details: wound care instructions given    Neoplasm of skin R epigastric  Epidermal / dermal shaving  Lesion diameter (cm):  1.2 Informed consent: discussed and consent obtained   Timeout: patient name, date of birth, surgical site, and  procedure verified   Procedure prep:  Patient was prepped and draped in usual sterile fashion Prep type:  Isopropyl alcohol Anesthesia: the lesion was anesthetized in a standard fashion   Anesthetic:  1% lidocaine w/ epinephrine 1-100,000 buffered w/ 8.4% NaHCO3 Instrument used: flexible razor blade   Hemostasis achieved with: pressure, aluminum chloride and electrodesiccation   Outcome: patient tolerated procedure well   Post-procedure details: sterile dressing applied and wound care instructions given   Dressing type: bandage and bacitracin    Destruction of lesion Complexity: extensive   Destruction method: electrodesiccation and curettage   Informed consent: discussed and consent obtained   Timeout:  patient name, date of birth, surgical site, and procedure verified Procedure prep:  Patient was prepped and draped in usual sterile fashion Prep type:  Isopropyl alcohol Anesthesia: the lesion was anesthetized in a standard fashion   Anesthetic:  1% lidocaine w/ epinephrine 1-100,000 buffered w/ 8.4% NaHCO3 Curettage performed in three different directions: Yes   Electrodesiccation performed over the curetted area: Yes   Lesion length (cm):  1.2 Lesion width (cm):  1.2 Margin per side (cm):  0.2 Final wound size (cm):  1.6 Hemostasis achieved with:  pressure, aluminum chloride and electrodesiccation Outcome: patient tolerated procedure well with no complications   Post-procedure details: sterile dressing applied and wound care instructions given   Dressing  type: bandage and bacitracin    Specimen 1 - Surgical pathology Differential Diagnosis: D48.5 R/O BCC  Check Margins: No Pink patch 1.2cm EDC today  Return in about 6 months (around 06/29/2021) for UBSE, Hx of BCC, Hx of SCC, Hx of AKs, Recheck AK L ant and L lat deltoid x 4.  I, Sonya Hupman, RMA, am acting as scribe for Sarina Ser, MD . Documentation: I have reviewed the above documentation for accuracy and  completeness, and I agree with the above.  Sarina Ser, MD

## 2020-12-27 NOTE — Patient Instructions (Addendum)
If you have any questions or concerns for your doctor, please call our main line at [phone] and press option 4 to reach your doctor's medical assistant. If no one answers, please leave a voicemail as directed and we will return your call as soon as possible. Messages left after 4 pm will be answered the following business day.   You may also send [place] a message via [place]. We typically respond to MyChart messages within 1-2 business days.  For prescription refills, please ask your pharmacy to contact our office. Our fax number is [phone].  If you have an urgent issue when the clinic is closed that cannot wait until the next business day, you can page your doctor at the number below.    Please note that while we do our best to be available for urgent issues outside of office hours, we are not available 24/7.   If you have an urgent issue and are unable to reach [place], you may choose to seek medical care at your doctor's office, retail clinic, urgent care center, or emergency room.  If you have a medical emergency, please immediately call 911 or go to the emergency department.  Pager Numbers  - Dr. Nehemiah Massed: [phone]  - Dr. Laurence Ferrari: [phone]  - Dr. Nicole Kindred: [phone]  In the event of inclement weather, please call our main line at [phone] for an update on the status of any delays or closures.  Dermatology Medication Tips: Please keep the boxes that topical medications come in in order to help keep track of the instructions about where and how to use these. Pharmacies typically print the medication instructions only on the boxes and not directly on the medication tubes.   If your medication is too expensive, please contact our office at [phone] option 4 or send [place] a message through [place].   We are unable to tell what your co-pay for medications will be in advance as this is different depending on your insurance coverage. However, we may be able to find a substitute  medication at lower cost or fill out paperwork to get insurance to cover a needed medication.   If a prior authorization is required to get your medication covered by your insurance company, please allow [place] 1-2 business days to complete this process.  Drug prices often vary depending on where the prescription is filled and some pharmacies may offer cheaper prices.  The website www.goodrx.com contains coupons for medications through different pharmacies. The prices here do not account for what the cost may be with help from insurance (it may be cheaper with your insurance), but the website can give you the price if you did not use any insurance.  - You can print the associated coupon and take it with your prescription to the pharmacy.  - You may also stop by our office during regular business hours and pick up a GoodRx coupon card.  - If you need your prescription sent electronically to a different pharmacy, notify our office through [facility] or by phone at [phone] option 4.   Instructions for [facility]  One or more of your medications was sent to the [facility]. You will receive an email from them and can purchase the medicine through that link. It will then be mailed to your home at the address you confirmed. If for any reason you do not receive an email from them, please check your spam folder. If you still do not find the  email, please let us know. [facility] phone number is [phone].   Wound Care Instructions  Cleanse wound gently with soap and water once a day then pat dry with clean gauze. Apply a thing coat of Petrolatum (petroleum jelly, "Vaseline") over the wound (unless you have an allergy to this). We recommend that you use a new, sterile tube of Vaseline. Do not pick or remove scabs. Do not remove the yellow or white "healing tissue" from the base of the wound.  Cover the wound with fresh, clean, nonstick  gauze and secure with paper tape. You may use Band-Aids in place of gauze and tape if the would is small enough, but would recommend trimming much of the tape off as there is often too much. Sometimes Band-Aids can irritate the skin.  You should call the office for your biopsy report after 1 week if you have not already been contacted.  If you experience any problems, such as abnormal amounts of bleeding, swelling, significant bruising, significant pain, or evidence of infection, please call the office immediately.  FOR ADULT SURGERY PATIENTS: If you need something for pain relief you may take 1 extra strength Tylenol (acetaminophen) AND 2 Ibuprofen (200mg  each) together every 4 hours as needed for pain. (do not take these if you are allergic to them or if you have a reason you should not take them.) Typically, you may only need pain medication for 1 to 3 days.

## 2020-12-29 ENCOUNTER — Encounter: Payer: Self-pay | Admitting: Dermatology

## 2021-01-08 ENCOUNTER — Telehealth: Payer: Self-pay

## 2021-01-08 NOTE — Telephone Encounter (Signed)
-----   Message from Ralene Bathe, MD sent at 01/08/2021 12:51 PM EDT ----- Diagnosis Skin , R epigastric SUPERFICIAL BASAL CELL CARCINOMA  Cancer - BCC Superficial Already treated Recheck next visit

## 2021-01-08 NOTE — Telephone Encounter (Signed)
Left message on voicemail to return my call.  

## 2021-01-10 ENCOUNTER — Other Ambulatory Visit: Payer: Medicare Other

## 2021-01-10 ENCOUNTER — Other Ambulatory Visit: Payer: Self-pay

## 2021-01-10 DIAGNOSIS — E291 Testicular hypofunction: Secondary | ICD-10-CM

## 2021-01-10 DIAGNOSIS — N401 Enlarged prostate with lower urinary tract symptoms: Secondary | ICD-10-CM

## 2021-01-11 LAB — TESTOSTERONE: Testosterone: 60 ng/dL — ABNORMAL LOW (ref 264–916)

## 2021-01-11 LAB — PSA: Prostate Specific Ag, Serum: 0.9 ng/mL (ref 0.0–4.0)

## 2021-01-14 ENCOUNTER — Ambulatory Visit (INDEPENDENT_AMBULATORY_CARE_PROVIDER_SITE_OTHER): Payer: Medicare Other | Admitting: Urology

## 2021-01-14 ENCOUNTER — Other Ambulatory Visit: Payer: Self-pay

## 2021-01-14 ENCOUNTER — Encounter: Payer: Self-pay | Admitting: Urology

## 2021-01-14 VITALS — BP 156/99 | HR 72 | Ht 69.0 in | Wt 220.0 lb

## 2021-01-14 DIAGNOSIS — N401 Enlarged prostate with lower urinary tract symptoms: Secondary | ICD-10-CM

## 2021-01-14 DIAGNOSIS — E291 Testicular hypofunction: Secondary | ICD-10-CM

## 2021-01-14 DIAGNOSIS — R339 Retention of urine, unspecified: Secondary | ICD-10-CM

## 2021-01-14 LAB — MICROSCOPIC EXAMINATION
Bacteria, UA: NONE SEEN
RBC, Urine: NONE SEEN /hpf (ref 0–2)

## 2021-01-14 LAB — URINALYSIS, COMPLETE
Bilirubin, UA: NEGATIVE
Glucose, UA: NEGATIVE
Ketones, UA: NEGATIVE
Leukocytes,UA: NEGATIVE
Nitrite, UA: NEGATIVE
Protein,UA: NEGATIVE
RBC, UA: NEGATIVE
Specific Gravity, UA: 1.015 (ref 1.005–1.030)
Urobilinogen, Ur: 0.2 mg/dL (ref 0.2–1.0)
pH, UA: 6.5 (ref 5.0–7.5)

## 2021-01-14 NOTE — Progress Notes (Signed)
01/14/2021 10:19 AM   [name] [birth year] [id number]  Referring provider: Sofie Hartigan, MD [place],  [place] [address]  Chief Complaint  Patient presents with   Benign Prostatic Hypertrophy    Urologic history: 1.  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  BPH with lower urinary tract symptoms             -Tamsulosin 0.4 mg daily  HPI: 70 y.o. male presents for annual follow-up.  At last years office visit worsening voiding symptoms.  PVR 132 cc.  Myrbetriq added to tamsulosin which did not improve his voiding symptoms Decrease tamsulosin to 0.4 mg since he did not note any significant improvement at 0.8 mg Testosterone level 07/17/2020 959 which was repeated 1 month later and 811 Labs 01/10/2021: Testosterone 60 mg/dL, PSA 0.9; he missed his scheduled dose and did not want to give it the day prior to his blood draw Followed by hematology for erythrocytosis and hematocrit 08/2020 49.4   PMH: Past Medical History:  Diagnosis Date   Actinic keratosis    Arthritis    Back pain    MULTIPLE BACK SURGERIES   Basal cell carcinoma 10/09/2016   L sup med scapula    Basal cell carcinoma 07/11/2019   L lat forehead above lat brow    Basal cell carcinoma 08/29/2020   R prox medial pretibial, EDC   Basal cell carcinoma 08/29/2020   R posterior ear, EDC   BCC (basal cell carcinoma of skin) 12/27/2020   R epigastric - ED&C   Benign essential HTN 12/07/2014   Last Assessment & Plan:  Relevant Hx: Course: Daily Update: Today's Plan: CONTROLLED ON MEDS   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/31/2013   Chronic back pain    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   Cirrhosis (HCC)    DDD (degenerative disc disease), cervical 11/06/2014   DDD (degenerative disc disease), lumbar 11/06/2014   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 Jacqlyn Larsen is his urologist.     Surgical History: Past Surgical History:  Procedure Laterality Date   BACK SURGERY  04/14/13   L2-S1   Cancer of right ear     3/17   CATARACT EXTRACTION Left 1989   CERVICAL SPINE SURGERY      X2 FUSION C4-7   CHOLECYSTECTOMY  07/2013   COLONOSCOPY WITH PROPOFOL N/A 12/18/2014   Procedure: COLONOSCOPY WITH PROPOFOL;  Surgeon: Lucilla Lame, MD;  Location: [facility];  Service: Endoscopy;  Laterality: N/A;   ELBOW ARTHROSCOPY Right 02/2012   ESOPHAGOGASTRODUODENOSCOPY (EGD) WITH PROPOFOL N/A 12/18/2014   Procedure: ESOPHAGOGASTRODUODENOSCOPY (EGD) WITH  PROPOFOL with dialtion;  Surgeon: Lucilla Lame, MD;  Location: [facility];  Service: Endoscopy;  Laterality: N/A;   FACIAL RECONSTRUCTION SURGERY  1970   BOTTLE CUT NOSE AND EYE, MULTIPLE SURGERIES   FINGER SURGERY Left 2015   THUMB   FINGER SURGERY Right 1966   MIDDLE   FOOT SURGERY Right [id number]   FUNDIPLICATION  99991111   HERNIA REPAIR Left 1981/1957   RIGHT SIDE IN 1957   KNEE ARTHROPLASTY Left 02/10/2020   Procedure: COMPUTER ASSISTED TOTAL KNEE ARTHROPLASTY - RNFA;  Surgeon: Dereck Leep, MD;  Location: [facility];  Service: Orthopedics;  Laterality: Left;   MASS EXCISION Left 02/16/2015   Procedure: LEFT BURSA ELBOW EXCISION;  Surgeon: Leanor Kail, MD;  Location: [facility];  Service: Orthopedics;   Laterality: Left;   NISSEN FUNDOPLICATION  99991111   NOSE SURGERY  12/01/02   SEPTUM RECONSTRUCTION   POLYPECTOMY  12/18/2014   Procedure: POLYPECTOMY;  Surgeon: Lucilla Lame, MD;  Location: [facility];  Service: Endoscopy;;   SEPTOPLASTY  [place] Left 1979   WITH ELBOW (TENDON RELEASE)   SKIN SURGERY  2004   SKIN CANCER NOSE    Home Medications:  Allergies as of 01/14/2021   No Known Allergies      Medication List        Accurate as of January 14, 2021 10:19 AM. If you have any questions, ask your nurse or doctor.          acetaminophen 650 MG CR tablet Commonly known as: TYLENOL Take by mouth.   amLODipine 10 MG tablet Commonly known as: NORVASC Take 10 mg by mouth daily.   amLODipine 10 MG tablet Commonly known as: NORVASC Take 1 tablet by mouth daily.   amoxicillin 500 MG capsule Commonly known as: AMOXIL Take by mouth.   celecoxib 200 MG capsule Commonly known as: CELEBREX TAKE 1 CAPSULE(200 MG) BY MOUTH TWICE DAILY   Cholecalciferol 50 MCG (2000 UT) Caps Take 3 capsules daily for 3 months, then reduce to 1 capsule daily thereafter for Vitamin D Deficiency.   citalopram 20 MG tablet Commonly known as: CELEXA Take 20 mg by mouth daily.   clonazePAM 0.5 MG tablet Commonly known as: KLONOPIN Take 1 tablet by mouth 2 (two) times daily as needed for anxiety.   clonazePAM 0.5 MG tablet Commonly known as: KLONOPIN Take by mouth.   cyclobenzaprine 10 MG tablet Commonly known as: FLEXERIL Take 1 tablet by mouth 3 (three) times daily.   DULoxetine 60 MG capsule Commonly known as: CYMBALTA TAKE 1 CAPSULE(60 MG) BY MOUTH EVERY DAY   fluticasone 50 MCG/ACT nasal spray Commonly known as: FLONASE Place into both nostrils daily.   furosemide 40 MG tablet Commonly known as: LASIX Take 40 mg by mouth daily as needed for fluid.   Lumify 0.025 % Soln Generic drug: Brimonidine Tartrate Place 1 drop into  both eyes daily as needed (redness).   multivitamin with minerals Tabs tablet Take 1 tablet by mouth daily.   NEEDLE (DISP) 23 G 23G X 1" Misc Use 1 Units every 10 (ten) days.   PreviDent 1.1 % Gel dental gel Generic drug: sodium fluoride BRUSH TEETH WITH PEASIZED AMOUNT AT BEDTIME. SPIT ACCESS SEVERAL TIMES BUT DO NOT RINSE   spironolactone 25 MG tablet Commonly known as: ALDACTONE Take 25 mg by mouth daily. AM   tamsulosin 0.4 MG Caps capsule Commonly known as: FLOMAX Take 1  capsule by mouth daily. What changed: Another medication with the same name was removed. Continue taking this medication, and follow the directions you see here. Changed by: Abbie Sons, MD   testosterone cypionate 200 MG/ML injection Commonly known as: DEPOTESTOSTERONE CYPIONATE INJECT 0.6 MLS INTO THE MUSCLE ONCE A WEEK   traZODone 50 MG tablet Commonly known as: DESYREL Take 50 mg by mouth at bedtime.   vitamin B-12 1000 MCG tablet Commonly known as: CYANOCOBALAMIN   cyanocobalamin 1000 MCG tablet Take 2 tablets daily for 2 weeks, then reduce to 1 tablet daily thereafter for Vitamin B12 Deficiency.        Allergies: No Known Allergies  Family History: Family History  Problem Relation Age of Onset   Arthritis Mother    Asthma Mother    Cancer Mother    Heart disease Father     Social History:  reports that he has never smoked. He has never used smokeless tobacco. He reports current alcohol use of about 1.0 standard drink of alcohol per week. He reports that he does not use drugs.   Physical Exam: BP (!) 156/99 (BP Location: Left Arm, Patient Position: Sitting, Cuff Size: Large)   Pulse 72   Ht '5\' 9"'$  (1.753 m)   Wt 220 lb (99.8 kg)   BMI 32.49 kg/m   Constitutional:  Alert and oriented, No acute distress. HEENT: [place] AT, moist mucus membranes.  Trachea midline, no masses. Cardiovascular: No clubbing, cyanosis, or edema. Respiratory: Normal respiratory effort, no increased work  of breathing. Skin: No rashes, bruises or suspicious lesions. Neurologic: Grossly intact, no focal deficits, moving all 4 extremities. Psychiatric: Normal mood and affect.  Laboratory Data:  Urinalysis Dipstick/microscopy negative  Assessment & Plan:    1.  BPH with LUTS Bladder scan PVR was initially >553 mL; he was asked to void a second time and repeat PVR >379 mL He is interested in pursuing surgical options and was primarily interested in UroLift Schedule cystoscopy with TRUS for bladder volume  2.  Hypogonadism No bothersome symptoms on his present dose and the recent low level was taken when he was past due for an injection Lab visit 6 months testosterone Continue annual follow-up with testosterone/PSA  3.  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

## 2021-01-17 ENCOUNTER — Telehealth: Payer: Self-pay

## 2021-01-17 NOTE — Telephone Encounter (Signed)
-----   Message from Ralene Bathe, MD sent at 01/08/2021 12:51 PM EDT ----- Diagnosis Skin , R epigastric SUPERFICIAL BASAL CELL CARCINOMA  Cancer - BCC Superficial Already treated Recheck next visit

## 2021-01-17 NOTE — Telephone Encounter (Signed)
Advised patient of results/hd  

## 2021-02-08 ENCOUNTER — Inpatient Hospital Stay: Payer: Medicare Other | Attending: Internal Medicine

## 2021-02-08 ENCOUNTER — Inpatient Hospital Stay: Payer: Medicare Other

## 2021-02-08 DIAGNOSIS — Z79890 Hormone replacement therapy (postmenopausal): Secondary | ICD-10-CM | POA: Insufficient documentation

## 2021-02-08 DIAGNOSIS — D751 Secondary polycythemia: Secondary | ICD-10-CM | POA: Insufficient documentation

## 2021-02-08 DIAGNOSIS — E291 Testicular hypofunction: Secondary | ICD-10-CM | POA: Insufficient documentation

## 2021-02-13 ENCOUNTER — Inpatient Hospital Stay: Payer: Medicare Other

## 2021-02-13 DIAGNOSIS — D751 Secondary polycythemia: Secondary | ICD-10-CM

## 2021-02-13 DIAGNOSIS — E291 Testicular hypofunction: Secondary | ICD-10-CM | POA: Diagnosis not present

## 2021-02-13 DIAGNOSIS — Z79890 Hormone replacement therapy (postmenopausal): Secondary | ICD-10-CM | POA: Diagnosis not present

## 2021-02-13 LAB — HEMOGLOBIN AND HEMATOCRIT, BLOOD
HCT: 48.4 % (ref 39.0–52.0)
Hemoglobin: 16.8 g/dL (ref 13.0–17.0)

## 2021-02-13 NOTE — Progress Notes (Signed)
HCT 48.4 per MD parameters no phlebotomy is required today.

## 2021-02-14 ENCOUNTER — Other Ambulatory Visit: Payer: Self-pay

## 2021-02-14 ENCOUNTER — Ambulatory Visit (INDEPENDENT_AMBULATORY_CARE_PROVIDER_SITE_OTHER): Payer: Medicare Other | Admitting: Urology

## 2021-02-14 ENCOUNTER — Encounter: Payer: Self-pay | Admitting: Urology

## 2021-02-14 VITALS — BP 145/93 | HR 98 | Ht 70.0 in | Wt 220.0 lb

## 2021-02-14 DIAGNOSIS — N401 Enlarged prostate with lower urinary tract symptoms: Secondary | ICD-10-CM

## 2021-02-14 LAB — URINALYSIS, COMPLETE
Bilirubin, UA: NEGATIVE
Glucose, UA: NEGATIVE
Leukocytes,UA: NEGATIVE
Nitrite, UA: NEGATIVE
Protein,UA: NEGATIVE
RBC, UA: NEGATIVE
Specific Gravity, UA: 1.015 (ref 1.005–1.030)
Urobilinogen, Ur: 0.2 mg/dL (ref 0.2–1.0)
pH, UA: 5.5 (ref 5.0–7.5)

## 2021-02-14 LAB — MICROSCOPIC EXAMINATION: Bacteria, UA: NONE SEEN

## 2021-02-14 LAB — BLADDER SCAN AMB NON-IMAGING: Scan Result: 167

## 2021-02-14 NOTE — Progress Notes (Signed)
02/14/21   HPI: 70 y.o. male with incomplete bladder emptying and bothersome LUTS interested in UroLift.  Bladder scan PVR today 167 mL     Please see previous notes for details.     See rooming tab for vitals NED. A&Ox3.   No respiratory distress   Abd soft, NT, ND Normal phallus with bilateral descended testicles    Cystoscopy Procedure Note  Patient identification was confirmed, informed consent was obtained, and patient was prepped using Betadine solution.  Lidocaine jelly was administered per urethral meatus.    Preoperative abx where received prior to procedure.     Pre-Procedure: - Inspection reveals a normal caliber ureteral meatus.  Procedure: The flexible cystoscope was introduced without difficulty - No urethral strictures/lesions are present. -Prominent lateral lobe enlargement prostate  -Small median lobe bladder neck - Bilateral ureteral orifices identified - Bladder mucosa  reveals no ulcers, tumors, or lesions - No bladder stones -Moderate trabeculation  Retroflexion shows small intravesical median lobe   Post-Procedure: - Patient tolerated the procedure well   Prostate transrectal ultrasound sizing   Informed consent was obtained after discussing risks/benefits of the procedure.  A time out was performed to ensure correct patient identity.   Pre-Procedure: -Transrectal probe was placed without difficulty -Transrectal Ultrasound performed revealing a 52 gm prostate measuring 3.6 x 5.4 x 5.02 cm (length) -Small median lobe noted     Assessment/ Plan: Based on cystoscopy and ultrasound volume he would be a candidate for UroLift The procedure was discussed in detail including the most common side effects of frequency, dysuria and hematuria for 7-14 days.  The low incidence of sexual and ejaculatory side effects were discussed We discussed there is no guarantee the procedure will resolve or improve his voiding symptoms however UroLift does not  preclude other procedures that could be performed in the future All questions were answered and he desires to schedule   Abbie Sons, MD

## 2021-02-18 ENCOUNTER — Telehealth: Payer: Self-pay | Admitting: Urology

## 2021-02-18 ENCOUNTER — Other Ambulatory Visit: Payer: Self-pay | Admitting: Urology

## 2021-02-18 DIAGNOSIS — E291 Testicular hypofunction: Secondary | ICD-10-CM

## 2021-02-18 NOTE — Telephone Encounter (Signed)
LMOM FOR PT. TO RETURN MY CALL TO DISCUSS SURGERY  

## 2021-02-18 NOTE — Telephone Encounter (Deleted)
-----  Message from Abbie Sons, MD sent at 02/17/2021  9:38 PM EDT ----- Regarding: Surgery scheduling Surgical Physician Order Form  #Length of Case: 30   #Medical Clearance if needed:    PCP     _0       Cardiology    _1        Pulmonology _2   #MD to give Clearance: n/a  #Anticoagulation: Hold all anticoagulation [n/a]  May continue aspirin _3           May continue all anticoagulants _4   #Post-op visit Date/Instructions: 4 weeks        KUB prior _5     RUS prior   _6     Voiding trial  _7     Cysto stent removal  _8    Follow up with: MD   #Diagnosis: BPH w/LUTS  #Procedure: UroLift  Admit type: Outpatient  Yes  Observation No   Inpatient No    Number of Days: ___   Anesthesia: MAC   Labs:  Per Anesthesia  [YES]    UA&Urine Culture [Yes]  CBC  _9    B MET   _10    PT/INR   _11   aPTT  _12            Urine Pregnancy _13    Urine Drug Screen _14     Type & Screen  _15   Crossmatch: ____ units  Other: ________   Tests:  EKG/Chest x-ray per Anesthesia [YES]         Chest X-ray  _16   EKG  _17   KUB day of procedure  _18    Medications:   Ancef 1g IV  _19       Ancef 2g IV  _20   Ciprofloxacin 414m IV  _21   Vancomycin 1g IV  _22   Ceftriaxone (Rocephin) 1g IV  _23     Clindamycin 6078mIV  _24   Clindamycin 90047mV  _25   Ampicillin 1g IV  _26   Keflex 500m33m  _27   Gentamicin __ mg IV or per pharmacy _28   Gemcitabine 2000mg46mdder instillation _29      Diflucan 100mg 58m_30    Botox injection _31  units ____  Magnesium citrate 1 bottle PO - when?___     Fleets enema - when?__  Other: Notify UroLift rep; Ryan PHarvin HazelTE Prophylaxis:     SCD's  _32       TED Hose  _33     Heparin 5000 units sq  _34         Other: Notify UroLift rep; Ryan PHarvin Hazel

## 2021-02-21 ENCOUNTER — Telehealth: Payer: Self-pay | Admitting: Urology

## 2021-02-21 NOTE — Telephone Encounter (Signed)
-----  Message from Scott C Stoioff, MD sent at 02/17/2021  9:38 PM EDT ----- Regarding: Surgery scheduling Surgical Physician Order Form  #Length of Case: 30   #Medical Clearance if needed:    PCP     []      Cardiology    []       Pulmonology []  #MD to give Clearance: n/a  #Anticoagulation: Hold all anticoagulation [n/a]  May continue aspirin []          May continue all anticoagulants []  #Post-op visit Date/Instructions: 4 weeks        KUB prior []    RUS prior   []    Voiding trial  []    Cysto stent removal  []   Follow up with: MD   #Diagnosis: BPH w/LUTS  #Procedure: UroLift  Admit type: Outpatient  Yes  Observation No   Inpatient No    Number of Days: ___   Anesthesia: MAC   Labs:  Per Anesthesia  [YES]    UA&Urine Culture [Yes]  CBC  []   B MET   []   PT/INR   []  aPTT  []           Urine Pregnancy []   Urine Drug Screen []    Type & Screen  []  Crossmatch: ____ units  Other: ________   Tests:  EKG/Chest x-ray per Anesthesia [YES]         Chest X-ray  []  EKG  []  KUB day of procedure  []   Medications:   Ancef 1g IV  []      Ancef 2g IV  [x]  Ciprofloxacin 400mg IV  []  Vancomycin 1g IV  []  Ceftriaxone (Rocephin) 1g IV  []    Clindamycin 600mg IV  []  Clindamycin 900mg IV  []  Ampicillin 1g IV  []  Keflex 500mg PO  []  Gentamicin __ mg IV or per pharmacy []  Gemcitabine 2000mg bladder instillation []     Diflucan 100mg IV  []   Botox injection [] units ____  Magnesium citrate 1 bottle PO - when?___     Fleets enema - when?__  Other: Notify UroLift rep; Ryan Pope     VTE Prophylaxis:     SCD's  []      TED Hose  []    Heparin 5000 units sq  []        Other: Notify UroLift rep; Ryan Pope      

## 2021-02-22 ENCOUNTER — Other Ambulatory Visit: Payer: Self-pay

## 2021-02-22 DIAGNOSIS — N401 Enlarged prostate with lower urinary tract symptoms: Secondary | ICD-10-CM

## 2021-02-22 MED ORDER — TESTOSTERONE CYPIONATE 200 MG/ML IM SOLN: INTRAMUSCULAR | 0 refills | Status: DC

## 2021-02-22 NOTE — Progress Notes (Signed)
[facility] Posting Form   Surgery Date/Time: Date: 04/02/2021  Surgeon: Dr. John Giovanni, MD  Surgery Location: Day Surgery  Inpt ( No  )   Outpt ()   Obs ( No  )   Diagnosis: N40.1 BPH with Urinary Tract Symptoms  -CPT: [id number], [phone]  Surgery: Cystoscopy with insertion of Urolift  Stop Anticoagulations: No  Cardiac/Medical/Pulmonary Clearance needed: none needed  *Orders entered into EPIC  Date: 02/22/21   *Case booked in [place]  Date: 02/22/21  *Notified pt of Surgery: Date: 02/22/21  PRE-OP UA & CX: Yes, Order placed  *Placed into Prior Authorization Work [place] Date: 02/22/21   Assistant/laser/rep: Yes, Representative needed.

## 2021-02-22 NOTE — Telephone Encounter (Signed)
Orders entered for surgery. Information faxed over to Pre Admit Testing.

## 2021-02-22 NOTE — Addendum Note (Signed)
Addended by: Gerald Leitz A on: 02/22/2021 01:25 PM   Modules accepted: Orders, SmartSet

## 2021-02-25 ENCOUNTER — Other Ambulatory Visit: Payer: Medicare Other

## 2021-03-01 ENCOUNTER — Telehealth: Payer: Self-pay

## 2021-03-01 ENCOUNTER — Telehealth: Payer: Self-pay | Admitting: *Deleted

## 2021-03-01 NOTE — Telephone Encounter (Signed)
Patient called after having seen PCP yesterday to report that his hours/hours has risen since his appointment on 9/7. He is asking what to do. Please advise  CBC w/auto Differential (3 Part)   Ref Range & Units 1 d ago  Hemoglobin 14.1 - 18.1 gm/dL 19.2 High    Hematocrit 40.0 - 52.0 % 55.5 High    Notes Component Ref Range & Units 2 wk ago  (02/13/21) 5 mo ago  (09/04/20)  Hemoglobin 13.0 - 17.0 g/dL 16.8    HCT 39.0 - 52.0 % 48.4  49.4

## 2021-03-01 NOTE — Telephone Encounter (Signed)
Spoke with Dr. Rogue Bussing- patient needs to be scheduled for phlebotomy. I contacted the patient. He requested apt next Tuesday. Apt given at 1045 on 03/05/21

## 2021-03-01 NOTE — Telephone Encounter (Signed)
Spoke with patient and informed him that this should not interfere with his Urolift Procedure.

## 2021-03-01 NOTE — Telephone Encounter (Signed)
Dr. Jacinto Reap- please advise if therapeutic phlebotomy is needed.

## 2021-03-01 NOTE — Telephone Encounter (Signed)
That should not interfere with his UroLift

## 2021-03-01 NOTE — Telephone Encounter (Signed)
Patient called in today and reports that he will be seeing a spine doctor on 10/3. States that he is going to be having an epidural steroid injection. He does not want this to interfere with his upcoming Urolift procedure. I told him I would run it by you to make sure.

## 2021-03-05 ENCOUNTER — Inpatient Hospital Stay: Payer: Medicare Other

## 2021-03-05 ENCOUNTER — Other Ambulatory Visit: Payer: Self-pay

## 2021-03-05 VITALS — BP 135/92 | HR 69 | Temp 98.1°F | Resp 20

## 2021-03-05 DIAGNOSIS — D751 Secondary polycythemia: Secondary | ICD-10-CM

## 2021-03-05 NOTE — Progress Notes (Signed)
Therapeutic phlebotomy performed in [facility] using 20g angiocath. 314mL removed. Pt tolerated procedure well. Oral hydration provided. Vital signs stable at discharge.

## 2021-03-05 NOTE — Patient Instructions (Signed)

## 2021-03-21 ENCOUNTER — Other Ambulatory Visit: Payer: Self-pay

## 2021-03-21 ENCOUNTER — Other Ambulatory Visit: Payer: Medicare Other

## 2021-03-21 DIAGNOSIS — N401 Enlarged prostate with lower urinary tract symptoms: Secondary | ICD-10-CM

## 2021-03-21 LAB — URINALYSIS, COMPLETE
Bilirubin, UA: NEGATIVE
Glucose, UA: NEGATIVE
Ketones, UA: NEGATIVE
Leukocytes,UA: NEGATIVE
Nitrite, UA: NEGATIVE
Protein,UA: NEGATIVE
RBC, UA: NEGATIVE
Specific Gravity, UA: 1.01 (ref 1.005–1.030)
Urobilinogen, Ur: 0.2 mg/dL (ref 0.2–1.0)
pH, UA: 6 (ref 5.0–7.5)

## 2021-03-25 ENCOUNTER — Other Ambulatory Visit: Payer: Self-pay

## 2021-03-25 ENCOUNTER — Encounter
Admission: RE | Admit: 2021-03-25 | Discharge: 2021-03-25 | Disposition: A | Discharge status: Home / Self Care | Payer: Medicare Other | Source: Ambulatory Visit | Attending: Urology | Admitting: Urology

## 2021-03-25 HISTORY — DX: Depression, unspecified: F32.A

## 2021-03-25 HISTORY — DX: Anxiety disorder, unspecified: F41.9

## 2021-03-25 NOTE — Patient Instructions (Signed)
Your procedure is scheduled on: 04/02/21 Report to [facility]. To find out your arrival time please call [phone] between 1PM - 3PM on 04/01/21.  Remember: Instructions that are not followed completely may result in serious medical risk, up to and including death, or upon the discretion of your surgeon and anesthesiologist your surgery may need to be rescheduled.     _X__ 1. Do not eat food or drink any liquids after midnight the night before your procedure.                 No gum chewing or hard candies.   __X__2.  On the morning of surgery brush your teeth with toothpaste and water, you                 may rinse your mouth with mouthwash if you wish.  Do not swallow any              toothpaste of mouthwash.     _X__ 3.  No Alcohol for 24 hours before or after surgery.   _X__ 4.  Do Not Smoke or use e-cigarettes For 24 Hours Prior to Your Surgery.                 Do not use any chewable tobacco products for at least 6 hours prior to                 surgery.  ____  5.  Bring all medications with you on the day of surgery if instructed.   __X__  6.  Notify your doctor if there is any change in your medical condition      (cold, fever, infections).     Do not wear jewelry, make-up, hairpins, clips or nail polish. Do not wear lotions, powders, or perfumes.  Do not shave 48 hours prior to surgery. Men may shave face and neck. Do not bring valuables to the hospital.    [facility] is not responsible for any belongings or valuables.  Contacts, dentures/partials or body piercings may not be worn into surgery. Bring a case for your contacts, glasses or hearing aids, a denture cup will be supplied. Leave your suitcase in the car. After surgery it may be brought to your room. For patients admitted to the hospital, discharge time is determined by your treatment team.   Patients discharged the day of surgery will not be allowed to  drive home.   Please read over the following fact sheets that you were given:     __X__ Take these medicines the morning of surgery with A SIP OF WATER:    1. amLODipine (NORVASC) 10 MG tablet  2. buPROPion (WELLBUTRIN XL) 300 MG 24 hr tablet  3. DULoxetine (CYMBALTA) 60 MG capsule  4. tamsulosin (FLOMAX) 0.4 MG CAPS capsule  5.  6.  ____ Fleet Enema (as directed)   ____ Use CHG Soap/SAGE wipes as directed  ____ Use inhalers on the day of surgery  ____ Stop metformin/Janumet/Farxiga 2 days prior to surgery    ____ Take 1/2 of usual insulin dose the night before surgery. No insulin the morning          of surgery.   ____ Stop Blood Thinners Coumadin/Plavix/Xarelto/Pleta/Pradaxa/Eliquis/Effient/Aspirin  on   Or contact your Surgeon, Cardiologist or Medical Doctor regarding  ability to stop your blood thinners  __X__ Stop Anti-inflammatories 7 days before surgery such as Advil, Ibuprofen, Motrin,  BC  or Goodies Powder, Naprosyn, Naproxen, Aleve, Aspirin    __X__ Stop all herbal supplements, fish oil or vitamin E until after surgery.    ____ Bring C-Pap to the hospital.

## 2021-03-27 ENCOUNTER — Encounter
Admission: RE | Admit: 2021-03-27 | Discharge: 2021-03-27 | Disposition: A | Discharge status: Home / Self Care | Payer: Medicare Other | Source: Ambulatory Visit | Attending: Urology | Admitting: Urology

## 2021-03-27 ENCOUNTER — Other Ambulatory Visit: Payer: Self-pay

## 2021-03-27 DIAGNOSIS — Z01810 Encounter for preprocedural cardiovascular examination: Secondary | ICD-10-CM | POA: Insufficient documentation

## 2021-03-27 DIAGNOSIS — I10 Essential (primary) hypertension: Secondary | ICD-10-CM | POA: Diagnosis not present

## 2021-03-27 LAB — CULTURE, URINE COMPREHENSIVE

## 2021-04-01 MED ORDER — FAMOTIDINE 20 MG PO TABS: 20.0000 mg | ORAL_TABLET | Freq: Once | ORAL | Status: AC

## 2021-04-01 MED ORDER — LACTATED RINGERS IV SOLN: INTRAVENOUS | Status: DC

## 2021-04-01 MED ORDER — ORAL CARE MOUTH RINSE
15.0000 mL | Freq: Once | OROMUCOSAL | Status: AC
Start: 2021-04-01 — End: 2021-04-02

## 2021-04-01 MED ORDER — CEFAZOLIN SODIUM-DEXTROSE 2-4 GM/100ML-% IV SOLN
2.0000 g | INTRAVENOUS | Status: AC
  Administered 2021-04-02: 2 g via INTRAVENOUS

## 2021-04-01 MED ORDER — CHLORHEXIDINE GLUCONATE 0.12 % MT SOLN: 15.0000 mL | Freq: Once | OROMUCOSAL | Status: AC

## 2021-04-02 ENCOUNTER — Encounter
Admission: RE | Disposition: A | Discharge status: Home / Self Care | Payer: Self-pay | Source: Home / Self Care | Attending: Urology

## 2021-04-02 ENCOUNTER — Other Ambulatory Visit: Payer: Self-pay

## 2021-04-02 ENCOUNTER — Encounter: Payer: Self-pay | Admitting: Urology

## 2021-04-02 ENCOUNTER — Ambulatory Visit
Admission: RE | Admit: 2021-04-02 | Discharge: 2021-04-02 | Disposition: A | Discharge status: Home / Self Care | Payer: Medicare Other | Attending: Urology | Admitting: Urology

## 2021-04-02 ENCOUNTER — Ambulatory Visit: Payer: Medicare Other | Admitting: Certified Registered Nurse Anesthetist

## 2021-04-02 ENCOUNTER — Ambulatory Visit: Payer: Medicare Other

## 2021-04-02 DIAGNOSIS — R3912 Poor urinary stream: Secondary | ICD-10-CM

## 2021-04-02 DIAGNOSIS — R3915 Urgency of urination: Secondary | ICD-10-CM

## 2021-04-02 DIAGNOSIS — R3914 Feeling of incomplete bladder emptying: Secondary | ICD-10-CM

## 2021-04-02 DIAGNOSIS — R350 Frequency of micturition: Secondary | ICD-10-CM

## 2021-04-02 DIAGNOSIS — N401 Enlarged prostate with lower urinary tract symptoms: Secondary | ICD-10-CM | POA: Insufficient documentation

## 2021-04-02 DIAGNOSIS — R3911 Hesitancy of micturition: Secondary | ICD-10-CM | POA: Insufficient documentation

## 2021-04-02 HISTORY — PX: CYSTOSCOPY WITH INSERTION OF UROLIFT: SHX6678

## 2021-04-02 SURGERY — CYSTOSCOPY WITH INSERTION OF UROLIFT
Anesthesia: General

## 2021-04-02 MED ORDER — FENTANYL CITRATE (PF) 100 MCG/2ML IJ SOLN
25.0000 ug | INTRAMUSCULAR | Status: DC | PRN
  Administered 2021-04-02: 25 ug via INTRAVENOUS

## 2021-04-02 MED ORDER — FENTANYL CITRATE (PF) 100 MCG/2ML IJ SOLN
INTRAMUSCULAR | Status: AC
  Filled 2021-04-02: qty 2

## 2021-04-02 MED ORDER — STERILE WATER FOR IRRIGATION IR SOLN
Status: DC | PRN
  Administered 2021-04-02: 4000 mL

## 2021-04-02 MED ORDER — MIDAZOLAM HCL 2 MG/2ML IJ SOLN
INTRAMUSCULAR | Status: DC | PRN
  Administered 2021-04-02: 2 mg via INTRAVENOUS

## 2021-04-02 MED ORDER — MIDAZOLAM HCL 2 MG/2ML IJ SOLN
INTRAMUSCULAR | Status: AC
  Filled 2021-04-02: qty 2

## 2021-04-02 MED ORDER — ONDANSETRON HCL 4 MG/2ML IJ SOLN: 4.0000 mg | Freq: Once | INTRAMUSCULAR | Status: DC | PRN

## 2021-04-02 MED ORDER — PROPOFOL 10 MG/ML IV BOLUS
INTRAVENOUS | Status: AC
  Filled 2021-04-02: qty 20

## 2021-04-02 MED ORDER — CEFAZOLIN SODIUM-DEXTROSE 2-4 GM/100ML-% IV SOLN
INTRAVENOUS | Status: AC
  Filled 2021-04-02: qty 100

## 2021-04-02 MED ORDER — PHENYLEPHRINE HCL (PRESSORS) 10 MG/ML IV SOLN
INTRAVENOUS | Status: DC | PRN
  Administered 2021-04-02 (×2): 100 ug via INTRAVENOUS

## 2021-04-02 MED ORDER — FENTANYL CITRATE (PF) 100 MCG/2ML IJ SOLN
INTRAMUSCULAR | Status: DC | PRN
  Administered 2021-04-02 (×2): 50 ug via INTRAVENOUS

## 2021-04-02 MED ORDER — LIDOCAINE HCL (CARDIAC) PF 100 MG/5ML IV SOSY
PREFILLED_SYRINGE | INTRAVENOUS | Status: DC | PRN
  Administered 2021-04-02: 100 mg via INTRAVENOUS

## 2021-04-02 MED ORDER — PROPOFOL 10 MG/ML IV BOLUS
INTRAVENOUS | Status: DC | PRN
  Administered 2021-04-02: 30 mg via INTRAVENOUS

## 2021-04-02 MED ORDER — PROPOFOL 500 MG/50ML IV EMUL
INTRAVENOUS | Status: DC | PRN
  Administered 2021-04-02: 125 ug/kg/min via INTRAVENOUS

## 2021-04-02 MED ORDER — CHLORHEXIDINE GLUCONATE 0.12 % MT SOLN
OROMUCOSAL | Status: AC
  Administered 2021-04-02: 15 mL via OROMUCOSAL
  Filled 2021-04-02: qty 15

## 2021-04-02 MED ORDER — FAMOTIDINE 20 MG PO TABS
ORAL_TABLET | ORAL | Status: AC
  Administered 2021-04-02: 20 mg via ORAL
  Filled 2021-04-02: qty 1

## 2021-04-02 MED ORDER — ONDANSETRON HCL 4 MG/2ML IJ SOLN
INTRAMUSCULAR | Status: DC | PRN
  Administered 2021-04-02: 4 mg via INTRAVENOUS

## 2021-04-02 SURGICAL SUPPLY — 18 items
BAG DRAIN CYSTO-URO LG1000N (MISCELLANEOUS) ×2 IMPLANT
BRUSH SCRUB EZ  4% CHG (MISCELLANEOUS) ×2
BRUSH SCRUB EZ 4% CHG (MISCELLANEOUS) ×1 IMPLANT
CATH FOL 2WAY LX 18X30 (CATHETERS) ×2 IMPLANT
GAUZE 4X4 16PLY ~~LOC~~+RFID DBL (SPONGE) ×4 IMPLANT
GLOVE SURG UNDER POLY LF SZ7.5 (GLOVE) ×2 IMPLANT
GOWN STRL REUS W/ TWL LRG LVL3 (GOWN DISPOSABLE) ×1 IMPLANT
GOWN STRL REUS W/ TWL XL LVL3 (GOWN DISPOSABLE) ×1 IMPLANT
GOWN STRL REUS W/TWL LRG LVL3 (GOWN DISPOSABLE) ×2
GOWN STRL REUS W/TWL XL LVL3 (GOWN DISPOSABLE) ×2
KIT TURNOVER CYSTO (KITS) ×2 IMPLANT
MANIFOLD NEPTUNE II (INSTRUMENTS) ×2 IMPLANT
PACK CYSTO AR (MISCELLANEOUS) ×2 IMPLANT
SET CYSTO W/LG BORE CLAMP LF (SET/KITS/TRAYS/PACK) ×2 IMPLANT
SURGILUBE 2OZ TUBE FLIPTOP (MISCELLANEOUS) IMPLANT
SYSTEM UROLIFT (Male Continence) ×14 IMPLANT
WATER STERILE IRR 3000ML UROMA (IV SOLUTION) ×4 IMPLANT
WATER STERILE IRR 500ML POUR (IV SOLUTION) ×2 IMPLANT

## 2021-04-02 NOTE — Op Note (Signed)
Preoperative diagnosis:  BPH with LUTS Urinary hesitancy Weak urinary stream Urinary frequency Urinary urgency Incomplete bladder emptying  Postoperative diagnosis:  Same  Procedure: Urolift (placement of 8 implants-7 delivered, 1 attempted)  Surgeon: Bernardo Heater  Anesthesia: MAC  Complications: None  Drains: Foley catheter-18 [place]  Estimated blood loss: < 5 mL  Indications:  [name] is a 70 y.o. male with severe lower urinary tract symptoms refractory to medical management.   Symptoms include weak urinary stream, intermittent stream, frequency and urgency.   IPSS 22/35 Bladder scan PVR 167 mL TRUS prostate for volume 52 cc Cystoscopy with prominent lateral lobe enlargement and a small median lobe Management options including TURP with resection/ablation of the prostate as well as Urolift were discussed.  The patient has chosen to have a Urolift procedure.  He has been instructed to the procedure as well as risks and complications which include but are not limited to infection, bleeding, and inadequate treatment with the Urolift procedure alone, anesthetic complications, among others.  He understands these and desires to proceed.  Findings: Using the 17 French cystoscope, urethra and bladder were inspected.  There were no urethral lesions.  Prostatic urethra showed moderate lateral lobe enlargement and a small median lobe.  The bladder was inspected circumferentially.  This revealed no mucosal abnormalities  Description of procedure: The patient was properly identified in the holding area.  He received preoperative IV antibiotics.  He was taken to the operating room where g sedation was obtained by anesthesia.  He is placed in the dorsolithotomy position.  Genitalia and perineum were prepped and draped.  Proper timeout was performed.  A 40F cystoscope was inserted into the bladder. The cystoscopy bridge was replaced with a UroLift delivery device.  The 1st pair of  implants were placed 1.5-2 cm from the bladder neck.  The 2nd pair of implants were placed just proximal to the verumontanum.  The initial right distal implant did not deliver.  The delivery device was then replaced with cystoscope and bridge and the implant location and opening effect was confirmed cystoscopically.  An additional pair of implants was placed between the prior pairs.  Repeat cystoscopy was performed and a final implant was placed slightly posterior to the mid right implant.  A final cystoscopy was conducted first to inspect the location and state of each implant and second, to confirm the presence of a continuous anterior channel was present through the prostatic urethra with irrigation flow turned off.  No injury to the bladder or urethra was detected.  A total of 4 implants were delivered on the right and 3 implants with the additional implant which was attempted but did not deliver.  [name] Kitchen   He tolerated procedure well and after anesthetic reversal was transported to the PACU in stable condition.  Plan: Catheter will be removed and postop if urine clearing Follow-up 1 month for IPSS/PVR   John Giovanni MD

## 2021-04-02 NOTE — Discharge Instructions (Addendum)
AMBULATORY SURGERY  DISCHARGE INSTRUCTIONS   The drugs that you were given will stay in your system until tomorrow so for the next 24 hours you should not:  Drive an automobile Make any legal decisions Drink any alcoholic beverage   You may resume regular meals tomorrow.  Today it is better to start with liquids and gradually work up to solid foods.  You may eat anything you prefer, but it is better to start with liquids, then soup and crackers, and gradually work up to solid foods.   Please notify your doctor immediately if you have any unusual bleeding, trouble breathing, redness and pain at the surgery site, drainage, fever, or pain not relieved by medication.    Additional Instructions:    Urolift Post-Operative Instructions        Patient Expectations     1. Mild hematuria for about 1 week.  2. Dysuria, frequency, and urgency for 10 days.  3. Mild pelvic pain 1-2 weeks.    Return to Activity    1.. Drink water post procedure.  2. Take meds as needed.  3. No lifting or straining 48hrs.  4. Other activity when you feel up to it.     Medications     1. NSAIDS as needed.  2. Pyridium / Uribel for dysuria x10 days as needed  3. Continue BPH Medications ( if on any ) for 10-14 days.     Follow-Up     1. Visit at 3wks, 17mo and annually with bladder scan.      Please contact your physician with any problems or Same Day Surgery at [phone], Monday through Friday 6 am to 4 pm, or [place] at [facility] number at [phone].

## 2021-04-02 NOTE — Transfer of Care (Signed)
Immediate Anesthesia Transfer of Care Note  Patient: [name]  Procedure(s) Performed: CYSTOSCOPY WITH INSERTION OF UROLIFT  Patient Location: PACU  Anesthesia Type:General  Level of Consciousness: drowsy  Airway & Oxygen Therapy: Patient Spontanous Breathing and Patient connected to face mask oxygen  Post-op Assessment: Report given to RN and Post -op Vital signs reviewed and stable  Post vital signs: Reviewed and stable  Last Vitals:  Vitals Value Taken Time  BP 125/84   Temp    Pulse 57 04/02/21 0911  Resp 23 04/02/21 0911  SpO2 100 % 04/02/21 0911  Vitals shown include unvalidated device data.  Last Pain:  Vitals:   04/02/21 0741  TempSrc: Temporal  PainSc: 3          Complications: No notable events documented.

## 2021-04-02 NOTE — Anesthesia Preprocedure Evaluation (Signed)
Anesthesia Evaluation  Patient identified by MRN, date of birth, ID band Patient awake    Reviewed: Allergy & Precautions, NPO status , Patient's Chart, lab work & pertinent test results  History of Anesthesia Complications Negative for: history of anesthetic complications  Airway Mallampati: II  TM Distance: >3 FB Neck ROM: Full    Dental  (+) Implants   Pulmonary neg pulmonary ROS, neg sleep apnea, neg COPD,    breath sounds clear to auscultation- rhonchi (-) wheezing      Cardiovascular hypertension, Pt. on medications (-) CAD, (-) Past MI, (-) Cardiac Stents and (-) CABG  Rhythm:Regular Rate:Normal - Systolic murmurs and - Diastolic murmurs    Neuro/Psych neg Seizures PSYCHIATRIC DISORDERS Anxiety Depression negative neurological ROS     GI/Hepatic GERD  ,(+) Hepatitis - (hx of hep c s/p treatment)  Endo/Other  negative endocrine ROSneg diabetes  Renal/GU Renal disease: hx of nephrolithiasis.     Musculoskeletal  (+) Arthritis ,   Abdominal (+) + obese,   Peds  Hematology negative hematology ROS (+)   Anesthesia Other Findings Past Medical History: No date: Actinic keratosis No date: Anxiety No date: Arthritis No date: Back pain     Comment:  MULTIPLE BACK SURGERIES 10/09/2016: Basal cell carcinoma     Comment:  L sup med scapula  07/11/2019: Basal cell carcinoma     Comment:  L lat forehead above lat brow  08/29/2020: Basal cell carcinoma     Comment:  R prox medial pretibial, EDC 08/29/2020: Basal cell carcinoma     Comment:  R posterior ear, EDC 12/27/2020: BCC (basal cell carcinoma of skin)     Comment:  R epigastric - ED&C 12/07/2014: Benign essential HTN     Comment:  Last Assessment & Plan:  Relevant Hx: Course: Daily               Update: Today's Plan: CONTROLLED ON MEDS 03/31/2013: Benign prostatic hyperplasia with urinary obstruction No date: Chronic back pain 03/31/2013: Chronic  inflammation of tunica albuginea No date: Cirrhosis ([place]) 11/06/2014: DDD (degenerative disc disease), cervical 11/06/2014: DDD (degenerative disc disease), lumbar No date: Depression 04/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     Comment:  Overview:  severe in feet  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 Jacqlyn Larsen is his urologist.    Reproductive/Obstetrics                             Anesthesia Physical Anesthesia Plan  ASA: 3  Anesthesia Plan: General   Post-op Pain Management:    Induction: Intravenous  PONV Risk Score and Plan: 1 and Propofol infusion  Airway Management Planned: Natural Airway  Additional Equipment:   Intra-op Plan:   Post-operative Plan:   Informed Consent: I have reviewed the patients History and Physical, chart, labs and discussed the procedure including the risks, benefits and  alternatives for the proposed anesthesia with the patient or authorized representative who has indicated his/her understanding and acceptance.     Dental advisory given  Plan Discussed with: CRNA and Anesthesiologist  Anesthesia Plan Comments:         Anesthesia Quick Evaluation

## 2021-04-02 NOTE — Anesthesia Postprocedure Evaluation (Signed)
Anesthesia Post Note  Patient: SWAN ZAYED  Procedure(s) Performed: CYSTOSCOPY WITH INSERTION OF UROLIFT  Patient location during evaluation: PACU Anesthesia Type: General Level of consciousness: awake and alert and oriented Pain management: pain level controlled Vital Signs Assessment: post-procedure vital signs reviewed and stable Respiratory status: spontaneous breathing, nonlabored ventilation and respiratory function stable Cardiovascular status: blood pressure returned to baseline and stable Postop Assessment: no signs of nausea or vomiting Anesthetic complications: no   No notable events documented.   Last Vitals:  Vitals:   04/02/21 1013 04/02/21 1114  BP: (!) 144/77 (!) 136/93  Pulse: 62 65  Resp: 18 18  Temp: (!) 36.3 C   SpO2: 95% 95%    Last Pain:  Vitals:   04/02/21 1013  TempSrc: Temporal  PainSc: 3                  Vickie Ponds

## 2021-04-02 NOTE — Anesthesia Procedure Notes (Signed)
Date/Time: 04/02/2021 8:40 AM Performed by: Lily Peer, [place], CRNA Pre-anesthesia Checklist: Patient identified, Emergency Drugs available, Suction available and Patient being monitored Patient Re-evaluated:Patient Re-evaluated prior to induction Oxygen Delivery Method: Simple face mask Induction Type: IV induction

## 2021-04-02 NOTE — H&P (Signed)
Urology H&P   Chief Complaint: Prostate enlargement  History of Present Illness: [name] is a 70 y.o. male with severe lower urinary tract symptoms refractory to medical management.  Symptoms include weak urinary stream, intermittent stream, frequency and urgency.   IPSS 22/35 Bladder scan PVR 167 mL TRUS prostate for volume 52 cc Cystoscopy with prominent lateral lobe enlargement and a small median lobe He has elected to proceed with UroLift   Past Medical History:  Diagnosis Date   Actinic keratosis    Anxiety    Arthritis    Back pain    MULTIPLE BACK SURGERIES   Basal cell carcinoma 10/09/2016   L sup med scapula    Basal cell carcinoma 07/11/2019   L lat forehead above lat brow    Basal cell carcinoma 08/29/2020   R prox medial pretibial, EDC   Basal cell carcinoma 08/29/2020   R posterior ear, [facility]   BCC (basal cell carcinoma of skin) 12/27/2020   R epigastric - ED&C   Benign essential HTN 12/07/2014   Last Assessment & Plan:  Relevant Hx: Course: Daily Update: Today's Plan: CONTROLLED ON MEDS   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/31/2013   Chronic back pain    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   Cirrhosis (HCC)    DDD (degenerative disc disease), cervical 11/06/2014   DDD (degenerative disc disease), lumbar 11/06/2014   Depression    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 Jacqlyn Larsen is his urologist.     Past Surgical History:  Procedure Laterality Date   BACK SURGERY  04/14/13   L2-S1   Cancer of right ear     3/17   CATARACT EXTRACTION Left 1989   CERVICAL SPINE SURGERY      X2 FUSION C4-7   CHOLECYSTECTOMY  07/2013   COLONOSCOPY WITH PROPOFOL N/A 12/18/2014   Procedure: COLONOSCOPY WITH PROPOFOL;  Surgeon: Lucilla Lame, MD;  Location: [place];  Service: Endoscopy;  Laterality: N/A;   ELBOW ARTHROSCOPY Right 02/2012   ESOPHAGOGASTRODUODENOSCOPY (EGD) WITH PROPOFOL N/A 12/18/2014   Procedure: ESOPHAGOGASTRODUODENOSCOPY (EGD) WITH PROPOFOL with dialtion;  Surgeon: Lucilla Lame, MD;  Location: [place];  Service: Endoscopy;  Laterality: N/A;   FACIAL RECONSTRUCTION SURGERY  1970   BOTTLE CUT NOSE AND EYE, MULTIPLE SURGERIES   FINGER SURGERY Left 2015   THUMB   FINGER SURGERY Right 1966   MIDDLE   FOOT SURGERY Right 9371   FUNDIPLICATION  6967   HERNIA REPAIR Left 1981/1957   RIGHT SIDE IN 1957   KNEE ARTHROPLASTY Left 02/10/2020   Procedure: COMPUTER ASSISTED TOTAL KNEE ARTHROPLASTY - RNFA;  Surgeon: Dereck Leep, MD;  Location: [facility];  Service: Orthopedics;  Laterality: Left;   MASS EXCISION Left 02/16/2015   Procedure: LEFT BURSA ELBOW EXCISION;  Surgeon: Leanor Kail, MD;  Location: [place];  Service: Orthopedics;  Laterality: Left;   NISSEN FUNDOPLICATION  4174   NOSE SURGERY  12/01/02   SEPTUM RECONSTRUCTION   POLYPECTOMY  12/18/2014   Procedure: POLYPECTOMY;  Surgeon: Lucilla Lame, MD;  Location: [place];  Service: Endoscopy;;   SEPTOPLASTY  1999   SHOULDER SURGERY Right 1983   SHOULDER SURGERY Left 1979   WITH ELBOW (TENDON RELEASE)   SKIN SURGERY  2004   SKIN CANCER NOSE    Home Medications:  Current Meds  Medication Sig   acetaminophen (TYLENOL) 650 MG CR tablet Take 650 mg by mouth every 8 (eight) hours as needed for pain.   amLODipine  (NORVASC) 10 MG tablet Take 10 mg by mouth daily.    Brimonidine Tartrate (LUMIFY) 0.025 % SOLN Place 1 drop into both eyes 2 (two) times daily as needed (redness).   buPROPion (WELLBUTRIN XL) 300 MG 24 hr tablet Take 300 mg by mouth daily.   celecoxib (CELEBREX) 200 MG capsule Take 200 mg by mouth 2 (two) times daily.   Cholecalciferol 125 MCG (5000 UT) TABS Take 5,000 Units by mouth daily.   clonazePAM (KLONOPIN) 0.5 MG tablet Take 0.5-1 mg by mouth 2 (two) times daily as needed for anxiety.   cyclobenzaprine (FLEXERIL) 10 MG tablet Take 10 mg by mouth 3 (three) times daily as needed for muscle spasms.   DULoxetine (CYMBALTA) 60 MG capsule TAKE 1 CAPSULE(60 MG) BY MOUTH EVERY DAY   fluticasone (FLONASE) 50 MCG/ACT nasal spray Place 2 sprays into both nostrils daily as needed for allergies.   furosemide (LASIX) 40 MG tablet Take 40 mg by mouth daily as needed for fluid.    Multiple Vitamin (MULTIVITAMIN WITH MINERALS) TABS tablet Take 1 tablet by mouth daily.   sodium fluoride (FLUORISHIELD) 1.1 % GEL dental gel Place 1 application onto teeth daily.   spironolactone (ALDACTONE) 25 MG tablet Take 25 mg by mouth daily. AM   tamsulosin (FLOMAX) 0.4 MG CAPS capsule Take 0.4 mg by mouth daily.   testosterone cypionate (DEPOTESTOSTERONE CYPIONATE) 200 MG/ML injection INJECT 0.6 MLS INTO THE MUSCLE ONCE A WEEK (Patient taking differently: INJECT 0.5 MLS INTO THE MUSCLE ONCE A WEEK)   traZODone (DESYREL) 50 MG tablet Take 50 mg by mouth at bedtime as needed for sleep.   vitamin B-12 (CYANOCOBALAMIN) 1000 MCG tablet Take 2,000 mcg by mouth daily.    Allergies: No Known Allergies  Family History  Problem Relation Age of Onset   Arthritis Mother    Asthma Mother    Cancer Mother    Heart disease Father     Social History:  reports that he has never smoked. He has never used smokeless tobacco. He reports current alcohol use of about 1.0 standard drink per week. He reports that he does not use  drugs.  ROS: A complete review of systems was performed.  All systems are negative except for pertinent findings as noted.  Physical Exam:  Vital signs in last 24 hours: Temp:  [97.3 F (36.3 C)] 97.3 F (36.3 C) (10/25 0741) Pulse Rate:  [68] 68 (10/25 0741) Resp:  [16] 16 (10/25 0741) BP: (153)/(94) 153/94 (10/25 0741) SpO2:  [97 %] 97 % (10/25 0741) Weight:  [99.8 kg] 99.8 kg (10/25 0741) Constitutional:  Alert and oriented, No acute distress HEENT: [place] AT, moist mucus membranes.  Trachea midline, no masses Cardiovascular: Regular rate and rhythm Respiratory: Normal respiratory effort, lungs clear bilaterally  Laboratory  Data:  No results for input(s): WBC, HGB, HCT in the last 72 hours. No results for input(s): NA, K, CL, CO2, GLUCOSE, BUN, CREATININE, CALCIUM in the last 72 hours. No results for input(s): LABPT, INR in the last 72 hours. No results for input(s): LABURIN in the last 72 hours. Results for orders placed or performed in visit on 03/21/21  CULTURE, URINE COMPREHENSIVE     Status: None   Collection Time: 03/21/21 10:40 AM   Specimen: Urine   UR  Result Value Ref Range Status   Urine Culture, Comprehensive Final report  Final   Organism ID, Bacteria Comment  Final    Comment: No growth in 36 - 48 hours.     Impression/Assessment:  BPH with LUTS  Plan:  UroLift The procedure has previously been discussed.  That was again reviewed today including postoperative care, follow-up and complications.  All questions were answered and he desires to proceed   04/02/2021, 8:24 AM  John Giovanni,  MD

## 2021-04-02 NOTE — Interval H&P Note (Signed)
History and Physical Interval Note:  04/02/2021 8:28 AM  [name]  has presented today for surgery, with the diagnosis of BPH with LUTS.  The various methods of treatment have been discussed with the patient and family. After consideration of risks, benefits and other options for treatment, the patient has consented to  Procedure(s): CYSTOSCOPY WITH INSERTION OF UROLIFT (N/A) as a surgical intervention.  The patient's history has been reviewed, patient examined, no change in status, stable for surgery.  I have reviewed the patient's chart and labs.  Questions were answered to the patient's satisfaction.     [place]

## 2021-04-03 ENCOUNTER — Encounter: Payer: Self-pay | Admitting: Urology

## 2021-04-03 ENCOUNTER — Encounter (INDEPENDENT_AMBULATORY_CARE_PROVIDER_SITE_OTHER): Payer: Medicare Other | Admitting: Urology

## 2021-05-06 ENCOUNTER — Encounter: Payer: Self-pay | Admitting: Urology

## 2021-05-06 ENCOUNTER — Encounter: Payer: Medicare Other | Admitting: Urology

## 2021-06-27 ENCOUNTER — Other Ambulatory Visit: Payer: Self-pay

## 2021-06-27 ENCOUNTER — Ambulatory Visit (INDEPENDENT_AMBULATORY_CARE_PROVIDER_SITE_OTHER): Payer: Medicare Other | Admitting: Dermatology

## 2021-06-27 DIAGNOSIS — L820 Inflamed seborrheic keratosis: Secondary | ICD-10-CM

## 2021-06-27 DIAGNOSIS — L578 Other skin changes due to chronic exposure to nonionizing radiation: Secondary | ICD-10-CM | POA: Diagnosis not present

## 2021-06-27 DIAGNOSIS — Z1283 Encounter for screening for malignant neoplasm of skin: Secondary | ICD-10-CM

## 2021-06-27 DIAGNOSIS — Z85828 Personal history of other malignant neoplasm of skin: Secondary | ICD-10-CM

## 2021-06-27 DIAGNOSIS — D229 Melanocytic nevi, unspecified: Secondary | ICD-10-CM | POA: Diagnosis not present

## 2021-06-27 DIAGNOSIS — L821 Other seborrheic keratosis: Secondary | ICD-10-CM

## 2021-06-27 DIAGNOSIS — L570 Actinic keratosis: Secondary | ICD-10-CM

## 2021-06-27 DIAGNOSIS — D1800 Hemangioma unspecified site: Secondary | ICD-10-CM

## 2021-06-27 DIAGNOSIS — L814 Other melanin hyperpigmentation: Secondary | ICD-10-CM

## 2021-06-27 MED ORDER — FLUOROURACIL 5 % EX CREA
TOPICAL_CREAM | Freq: Two times a day (BID) | CUTANEOUS | 1 refills | Status: DC
Start: 2021-06-27 — End: 2022-03-27

## 2021-06-27 NOTE — Progress Notes (Signed)
Follow-Up Visit   Subjective  [name] is a 71 y.o. male who presents for the following: Follow-up (Patient here today for 6 month upper body exam. Patient has history of bcc, scc, aks, and isks. Patient used 5 fluorouracil cream at forehead, nose, and temples. Patient reports cream helped. ). The patient presents for upper-Body Skin Exam (UBSE) for skin cancer screening and mole check.  The patient has spots, moles and lesions to be evaluated, some may be new or changing and the patient has concerns that these could be cancer.  The following portions of the chart were reviewed this encounter and updated as appropriate:  Tobacco   Allergies   Meds   Problems   Med Hx   Surg Hx   Fam Hx      Review of Systems: No other skin or systemic complaints except as noted in HPI or Assessment and Plan.  Objective  Well appearing patient in no apparent distress; mood and affect are within normal limits.  A focused examination was performed including waist up. Relevant physical exam findings are noted in the Assessment and Plan.  face x 3 (3) Erythematous thin papules/macules with gritty scale.   arms and hands x 28 (28) Erythematous stuck-on, waxy papule or plaque   Assessment & Plan  Actinic keratosis (3) face x 3  Actinic keratoses are precancerous spots that appear secondary to cumulative UV radiation exposure/sun exposure over time. They are chronic with expected duration over 1 year. A portion of actinic keratoses will progress to squamous cell carcinoma of the skin. It is not possible to reliably predict which spots will progress to skin cancer and so treatment is recommended to prevent development of skin cancer.  Recommend daily broad spectrum sunscreen SPF 30+ to sun-exposed areas, reapply every 2 hours as needed.  Recommend staying in the shade or wearing long sleeves, sun glasses (UVA+UVB protection) and wide brim hats (4-inch brim around the entire circumference of the  hat). Call for new or changing lesions.   Wait a month and start  Start 5-fluorouracil/calcipotriene cream twice a day for 7 days to affected areas including top of hands . Prescription sent to [facility]. Patient provided with contact information for pharmacy and advised the pharmacy will mail the prescription to their home. Patient provided with handout reviewing treatment course and side effects and advised to call or message [place] on MyChart with any concerns.   Destruction of lesion - face x 3 Complexity: simple   Destruction method: cryotherapy   Informed consent: discussed and consent obtained   Timeout:  patient name, date of birth, surgical site, and procedure verified Lesion destroyed using liquid nitrogen: Yes   Region frozen until ice ball extended beyond lesion: Yes   Outcome: patient tolerated procedure well with no complications   Post-procedure details: wound care instructions given   Additional details:  Prior to procedure, discussed risks of blister formation, small wound, skin dyspigmentation, or rare scar following cryotherapy. Recommend Vaseline ointment to treated areas while healing.   Related Medications fluorouracil (EFUDEX) 5 % cream Apply topically 2 (two) times daily. For 7 days to top of both hands  Inflamed seborrheic keratosis (28) arms and hands x 28  Irritated   Destruction of lesion - arms and hands x 28 Complexity: simple   Destruction method: cryotherapy   Informed consent: discussed and consent obtained   Timeout:  patient name, date of birth, surgical site, and procedure verified Lesion destroyed using liquid nitrogen:  Yes   Region frozen until ice ball extended beyond lesion: Yes   Outcome: patient tolerated procedure well with no complications   Post-procedure details: wound care instructions given   Additional details:  Prior to procedure, discussed risks of blister formation, small wound, skin dyspigmentation, or rare scar following  cryotherapy. Recommend Vaseline ointment to treated areas while healing.   Skin cancer screening  Lentigines - Scattered tan macules - Due to sun exposure - Benign-appearing, observe - Recommend daily broad spectrum sunscreen SPF 30+ to sun-exposed areas, reapply every 2 hours as needed. - Call for any changes  Seborrheic Keratoses - Stuck-on, waxy, tan-brown papules and/or plaques  - Benign-appearing - Discussed benign etiology and prognosis. - Observe - Call for any changes  Melanocytic Nevi - Tan-brown and/or pink-flesh-colored symmetric macules and papules - Benign appearing on exam today - Observation - Call clinic for new or changing moles - Recommend daily use of broad spectrum spf 30+ sunscreen to sun-exposed areas.   Hemangiomas - Red papules - Discussed benign nature - Observe - Call for any changes  Actinic Damage - Chronic condition, secondary to cumulative UV/sun exposure - diffuse scaly erythematous macules with underlying dyspigmentation - Recommend daily broad spectrum sunscreen SPF 30+ to sun-exposed areas, reapply every 2 hours as needed.  - Staying in the shade or wearing long sleeves, sun glasses (UVA+UVB protection) and wide brim hats (4-inch brim around the entire circumference of the hat) are also recommended for sun protection.  - Call for new or changing lesions.  History of Basal Cell Carcinoma of the Skin - No evidence of recurrence today - Recommend regular full body skin exams - Recommend daily broad spectrum sunscreen SPF 30+ to sun-exposed areas, reapply every 2 hours as needed.  - Call if any new or changing lesions are noted between office visits  History of Squamous Cell Carcinoma of the Skin - No evidence of recurrence today - No lymphadenopathy - Recommend regular full body skin exams - Recommend daily broad spectrum sunscreen SPF 30+ to sun-exposed areas, reapply every 2 hours as needed.  - Call if any new or changing lesions are  noted between office visits  Skin cancer screening performed today. Return for 6 month upper body exam history bcc/scc . IRuthell Rummage, CMA, am acting as scribe for Sarina Ser, MD. Documentation: I have reviewed the above documentation for accuracy and completeness, and I agree with the above.  Sarina Ser, MD

## 2021-06-27 NOTE — Patient Instructions (Addendum)
Wait a month before staring cream to top of both hands.   Start 5-fluorouracil/calcipotriene cream twice a day for 7 days to affected areas including top of hands. Prescription sent to [facility]. Patient provided with contact information for pharmacy and advised the pharmacy will mail the prescription to their home. Patient provided with handout reviewing treatment course and side effects and advised to call or message [place] on MyChart with any concerns.  5-Fluorouracil/Calcipotriene Patient Education   Actinic keratoses are the dry, red scaly spots on the skin caused by sun damage. A portion of these spots can turn into skin cancer with time, and treating them can help prevent development of skin cancer.   Treatment of these spots requires removal of the defective skin cells. There are various ways to remove actinic keratoses, including freezing with liquid nitrogen, treatment with creams, or treatment with a blue light procedure in the office.   5-fluorouracil cream is a topical cream used to treat actinic keratoses. It works by interfering with the growth of abnormal fast-growing skin cells, such as actinic keratoses. These cells peel off and are replaced by healthy ones.   5-fluorouracil/calcipotriene is a combination of the 5-fluorouracil cream with a vitamin D analog cream called calcipotriene. The calcipotriene alone does not treat actinic keratoses. However, when it is combined with 5-fluorouracil, it helps the 5-fluorouracil treat the actinic keratoses much faster so that the same results can be achieved with a much shorter treatment time.  INSTRUCTIONS FOR 5-FLUOROURACIL/CALCIPOTRIENE CREAM:   5-fluorouracil/calcipotriene cream typically only needs to be used for 4-7 days. A thin layer should be applied twice a day to the treatment areas recommended by your physician.   If your physician prescribed you separate tubes of 5-fluourouracil and calcipotriene, apply a thin layer of  5-fluorouracil followed by a thin layer of calcipotriene.   Avoid contact with your eyes, nostrils, and mouth. Do not use 5-fluorouracil/calcipotriene cream on infected or open wounds.   You will develop redness, irritation and some crusting at areas where you have pre-cancer damage/actinic keratoses. IF YOU DEVELOP PAIN, BLEEDING, OR SIGNIFICANT CRUSTING, STOP THE TREATMENT EARLY - you have already gotten a good response and the actinic keratoses should clear up well.  Wash your hands after applying 5-fluorouracil 5% cream on your skin.   A moisturizer or sunscreen with a minimum SPF 30 should be applied each morning.   Once you have finished the treatment, you can apply a thin layer of Vaseline twice a day to irritated areas to soothe and calm the areas more quickly. If you experience significant discomfort, contact your physician.  For some patients it is necessary to repeat the treatment for best results.  SIDE EFFECTS: When using 5-fluorouracil/calcipotriene cream, you may have mild irritation, such as redness, dryness, swelling, or a mild burning sensation. This usually resolves within 2 weeks. The more actinic keratoses you have, the more redness and inflammation you can expect during treatment. Eye irritation has been reported rarely. If this occurs, please let us know.  If you have any trouble using this cream, please call the office. If you have any other questions about this information, please do not hesitate to ask me before you leave the office.          Actinic keratoses are precancerous spots that appear secondary to cumulative UV radiation exposure/sun exposure over time. They are chronic with expected duration over 1 year. A portion of actinic keratoses will progress to squamous cell carcinoma of the skin. It  is not possible to reliably predict which spots will progress to skin cancer and so treatment is recommended to prevent development of skin cancer.  Recommend daily  broad spectrum sunscreen SPF 30+ to sun-exposed areas, reapply every 2 hours as needed.  Recommend staying in the shade or wearing long sleeves, sun glasses (UVA+UVB protection) and wide brim hats (4-inch brim around the entire circumference of the hat). Call for new or changing lesions.   Cryotherapy Aftercare  Wash gently with soap and water everyday.   Apply Vaseline and Band-Aid daily until healed.     Melanoma ABCDEs  Melanoma is the most dangerous type of skin cancer, and is the leading cause of death from skin disease.  You are more likely to develop melanoma if you: Have light-colored skin, light-colored eyes, or red or blond hair Spend a lot of time in the sun Tan regularly, either outdoors or in a tanning bed Have had blistering sunburns, especially during childhood Have a close family member who has had a melanoma Have atypical moles or large birthmarks  Early detection of melanoma is key since treatment is typically straightforward and cure rates are extremely high if we catch it early.   The first sign of melanoma is often a change in a mole or a new dark spot.  The ABCDE system is a way of remembering the signs of melanoma.  A for asymmetry:  The two halves do not match. B for border:  The edges of the growth are irregular. C for color:  A mixture of colors are present instead of an even brown color. D for diameter:  Melanomas are usually (but not always) greater than 19mm - the size of a pencil eraser. E for evolution:  The spot keeps changing in size, shape, and color.  Please check your skin once per month between visits. You can use a small mirror in front and a large mirror behind you to keep an eye on the back side or your body.   If you see any new or changing lesions before your next follow-up, please call to schedule a visit.  Please continue daily skin protection including broad spectrum sunscreen SPF 30+ to sun-exposed areas, reapplying every 2 hours as  needed when you're outdoors.   Staying in the shade or wearing long sleeves, sun glasses (UVA+UVB protection) and wide brim hats (4-inch brim around the entire circumference of the hat) are also recommended for sun protection.    If You Need Anything After Your Visit  If you have any questions or concerns for your doctor, please call our main line at [phone] and press option 4 to reach your doctor's medical assistant. If no one answers, please leave a voicemail as directed and we will return your call as soon as possible. Messages left after 4 pm will be answered the following business day.   You may also send [place] a message via [place]. We typically respond to MyChart messages within 1-2 business days.  For prescription refills, please ask your pharmacy to contact our office. Our fax number is [phone].  If you have an urgent issue when the clinic is closed that cannot wait until the next business day, you can page your doctor at the number below.    Please note that while we do our best to be available for urgent issues outside of office hours, we are not available 24/7.   If you have an urgent issue and are unable to reach [place], you may  choose to seek medical care at your doctor's office, retail clinic, urgent care center, or emergency room.  If you have a medical emergency, please immediately call 911 or go to the emergency department.  Pager Numbers  - Dr. Nehemiah Massed: [phone]  - Dr. Laurence Ferrari: [phone]  - Dr. Nicole Kindred: [phone]  In the event of inclement weather, please call our main line at [phone] for an update on the status of any delays or closures.  Dermatology Medication Tips: Please keep the boxes that topical medications come in in order to help keep track of the instructions about where and how to use these. Pharmacies typically print the medication instructions only on the boxes and not directly on the medication tubes.   If your medication is too  expensive, please contact our office at [phone] option 4 or send [place] a message through [place].   We are unable to tell what your co-pay for medications will be in advance as this is different depending on your insurance coverage. However, we may be able to find a substitute medication at lower cost or fill out paperwork to get insurance to cover a needed medication.   If a prior authorization is required to get your medication covered by your insurance company, please allow [place] 1-2 business days to complete this process.  Drug prices often vary depending on where the prescription is filled and some pharmacies may offer cheaper prices.  The website www.goodrx.com contains coupons for medications through different pharmacies. The prices here do not account for what the cost may be with help from insurance (it may be cheaper with your insurance), but the website can give you the price if you did not use any insurance.  - You can print the associated coupon and take it with your prescription to the pharmacy.  - You may also stop by our office during regular business hours and pick up a GoodRx coupon card.  - If you need your prescription sent electronically to a different pharmacy, notify our office through [facility] or by phone at [phone] option 4.     Si Usted Necesita Algo Despus de Su Visita  Tambin puede enviarnos un mensaje a travs de Pharmacist, community. Por lo general respondemos a los mensajes de MyChart en el transcurso de 1 a 2 das hbiles.  Para renovar recetas, por favor pida a su farmacia que se ponga en contacto con nuestra oficina. Harland Dingwall de fax es [place] [phone].  Si tiene un asunto urgente cuando la clnica est cerrada y que no puede esperar hasta el siguiente da hbil, puede llamar/localizar a su doctor(a) al nmero que aparece a continuacin.   Por favor, tenga en cuenta que aunque hacemos todo lo posible para estar disponibles para asuntos urgentes fuera  del horario de [place], no estamos disponibles las 24 horas del da, los 7 das de la [place].   Si tiene un problema urgente y no puede comunicarse con nosotros, puede optar por buscar atencin mdica  en el consultorio de su doctor(a), en una clnica privada, en un centro de atencin urgente o en una sala de emergencias.  Si tiene Engineering geologist, por favor llame inmediatamente al 911 o vaya a la sala de emergencias.  Nmeros de bper  - Dr. Nehemiah Massed: [phone]  - Dra. Moye: [phone]  - Dra. Nicole Kindred: [phone]  En caso de inclemencias del [place], por favor llame a Johnsie Kindred principal al [phone] para una actualizacin sobre el [place] de cualquier retraso o cierre.  Consejos para la medicacin en dermatologa: Por favor, guarde las cajas en las que vienen los medicamentos de uso tpico para ayudarle a seguir las instrucciones sobre dnde y cmo usarlos. Las farmacias generalmente imprimen las instrucciones del medicamento slo en las cajas y no directamente en los tubos del [place].   Si su medicamento es muy caro, por favor, pngase en contacto con Zigmund Daniel llamando al [phone] y presione la opcin 4 o envenos un mensaje a travs de Pharmacist, community.   No podemos decirle cul ser su copago por los medicamentos por adelantado ya que esto es diferente dependiendo de la cobertura de su seguro. Sin embargo, es posible que podamos encontrar un medicamento sustituto a Electrical engineer un formulario para que el seguro cubra el medicamento que se considera necesario.   Si se requiere una autorizacin previa para que su compaa de seguros [place] su medicamento, por favor permtanos de 1 a 2 das hbiles para completar este proceso.  Los precios de los medicamentos varan con frecuencia dependiendo del Environmental consultant de dnde se surte la receta y alguna farmacias pueden ofrecer precios ms baratos.  El sitio web www.goodrx.com tiene cupones para medicamentos de Office manager. Los precios aqu no tienen en cuenta lo que podra costar con la ayuda del seguro (puede ser ms barato con su seguro), pero el sitio web puede darle el precio si no utiliz Research scientist (physical sciences).  - Puede imprimir el cupn correspondiente y llevarlo con su receta a la farmacia.  - Tambin puede pasar por nuestra oficina durante el horario de atencin regular y Charity fundraiser una tarjeta de cupones de GoodRx.  - Si necesita que su receta se enve electrnicamente a una farmacia diferente, informe a nuestra oficina a travs de MyChart de [place] o por telfono llamando al [phone] y presione la opcin 4.

## 2021-07-01 ENCOUNTER — Other Ambulatory Visit: Payer: Self-pay | Admitting: Urology

## 2021-07-01 DIAGNOSIS — E291 Testicular hypofunction: Secondary | ICD-10-CM

## 2021-07-02 ENCOUNTER — Encounter: Payer: Self-pay | Admitting: Dermatology

## 2021-07-03 ENCOUNTER — Other Ambulatory Visit: Payer: Self-pay | Admitting: Gastroenterology

## 2021-07-03 DIAGNOSIS — Z8619 Personal history of other infectious and parasitic diseases: Secondary | ICD-10-CM

## 2021-07-03 DIAGNOSIS — R7401 Elevation of levels of liver transaminase levels: Secondary | ICD-10-CM

## 2021-07-07 NOTE — Telephone Encounter (Signed)
Limited refill was sent.  Due for lab visit/testosterone level next.  He also was a no-show for his 1 month post op UroLift appointment and recommend rescheduling with IPSS and bladder scan

## 2021-07-10 ENCOUNTER — Other Ambulatory Visit: Payer: Self-pay

## 2021-07-10 DIAGNOSIS — E291 Testicular hypofunction: Secondary | ICD-10-CM

## 2021-07-16 ENCOUNTER — Other Ambulatory Visit: Payer: Self-pay

## 2021-07-16 ENCOUNTER — Other Ambulatory Visit: Payer: Medicare Other

## 2021-07-16 DIAGNOSIS — E291 Testicular hypofunction: Secondary | ICD-10-CM

## 2021-07-17 LAB — TESTOSTERONE: Testosterone: 448 ng/dL (ref 264–916)

## 2021-07-19 ENCOUNTER — Other Ambulatory Visit: Payer: Self-pay

## 2021-07-19 ENCOUNTER — Ambulatory Visit
Admission: RE | Admit: 2021-07-19 | Discharge: 2021-07-19 | Disposition: A | Discharge status: Home / Self Care | Payer: Medicare Other | Source: Ambulatory Visit | Attending: Gastroenterology | Admitting: Gastroenterology

## 2021-07-19 DIAGNOSIS — Z8619 Personal history of other infectious and parasitic diseases: Secondary | ICD-10-CM | POA: Insufficient documentation

## 2021-07-19 DIAGNOSIS — R7401 Elevation of levels of liver transaminase levels: Secondary | ICD-10-CM | POA: Diagnosis present

## 2021-07-22 ENCOUNTER — Encounter: Payer: Self-pay | Admitting: Urology

## 2021-07-22 ENCOUNTER — Other Ambulatory Visit: Payer: Self-pay | Admitting: Urology

## 2021-07-22 ENCOUNTER — Encounter: Payer: Medicare Other | Admitting: Urology

## 2021-07-22 DIAGNOSIS — E291 Testicular hypofunction: Secondary | ICD-10-CM

## 2021-07-24 ENCOUNTER — Other Ambulatory Visit: Payer: Self-pay | Admitting: Orthopedic Surgery

## 2021-07-24 ENCOUNTER — Other Ambulatory Visit (HOSPITAL_COMMUNITY): Payer: Self-pay | Admitting: Orthopedic Surgery

## 2021-07-24 DIAGNOSIS — Z96652 Presence of left artificial knee joint: Secondary | ICD-10-CM

## 2021-07-24 DIAGNOSIS — M25561 Pain in right knee: Secondary | ICD-10-CM

## 2021-07-26 ENCOUNTER — Ambulatory Visit (INDEPENDENT_AMBULATORY_CARE_PROVIDER_SITE_OTHER): Payer: Medicare Other | Admitting: Urology

## 2021-07-26 ENCOUNTER — Other Ambulatory Visit: Payer: Self-pay

## 2021-07-26 ENCOUNTER — Encounter: Payer: Self-pay | Admitting: Urology

## 2021-07-26 VITALS — BP 122/92 | HR 86 | Ht 69.0 in | Wt 220.0 lb

## 2021-07-26 DIAGNOSIS — N401 Enlarged prostate with lower urinary tract symptoms: Secondary | ICD-10-CM | POA: Diagnosis not present

## 2021-07-26 DIAGNOSIS — E291 Testicular hypofunction: Secondary | ICD-10-CM | POA: Diagnosis not present

## 2021-07-26 LAB — BLADDER SCAN AMB NON-IMAGING: Scan Result: 58

## 2021-07-26 NOTE — Progress Notes (Signed)
07/26/2021 10:42 AM   [name] [birth year] [id number]  Referring provider: Latanya Maudlin, NP [place],  [place] [address]  Chief Complaint  Patient presents with   Benign Prostatic Hypertrophy    Urologic history: 1.  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  BPH with lower urinary tract symptoms             -Tamsulosin 0.8 mg daily  - UroLift 03/2021  HPI: 71 y.o. male presents for follow-up visit.  Status post UroLift 04/02/2021 Preop IPSS 22/35 Preop PVR 167 mL Post procedure he states he is voiding with an excellent stream.  Still has some occasional urinary frequency IPSS today 6/35 Bladder scan PVR 58 mL T level 07/16/2021 448 PSA 01/2021 0.9   PMH: Past Medical History:  Diagnosis Date   Actinic keratosis    Anxiety    Arthritis    Back pain    MULTIPLE BACK SURGERIES   Basal cell carcinoma 10/09/2016   L sup med scapula    Basal cell carcinoma 07/11/2019   L lat forehead above lat brow    Basal cell carcinoma 08/29/2020   R prox medial pretibial, EDC   Basal cell carcinoma 08/29/2020   R posterior ear, EDC   BCC (basal cell carcinoma of skin) 12/27/2020   R epigastric - ED&C   Benign essential HTN 12/07/2014   Last Assessment & Plan:  Relevant Hx: Course: Daily Update: Today's Plan: CONTROLLED ON MEDS   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/31/2013   Chronic back pain    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   Cirrhosis (HCC)    DDD (degenerative disc disease), cervical 11/06/2014   DDD (degenerative disc disease), lumbar 11/06/2014   Depression    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 Jacqlyn Larsen is his urologist.     Surgical History: Past Surgical History:  Procedure Laterality Date   BACK SURGERY  04/14/13   L2-S1   Cancer of right ear     3/17   CATARACT EXTRACTION Left 1989   CERVICAL SPINE SURGERY      X2 FUSION C4-7   CHOLECYSTECTOMY  07/2013   COLONOSCOPY WITH PROPOFOL N/A 12/18/2014   Procedure: COLONOSCOPY WITH PROPOFOL;  Surgeon: Lucilla Lame, MD;  Location: [place];  Service: Endoscopy;  Laterality: N/A;   CYSTOSCOPY WITH INSERTION OF UROLIFT N/A 04/02/2021   Procedure: CYSTOSCOPY WITH INSERTION OF UROLIFT;  Surgeon: Abbie Sons, MD;  Location: [facility];  Service: Urology;  Laterality: N/A;   ELBOW ARTHROSCOPY Right 02/2012   ESOPHAGOGASTRODUODENOSCOPY (EGD) WITH PROPOFOL N/A 12/18/2014   Procedure: ESOPHAGOGASTRODUODENOSCOPY (  EGD) WITH PROPOFOL with dialtion;  Surgeon: Lucilla Lame, MD;  Location: [place];  Service: Endoscopy;  Laterality: N/A;   FACIAL RECONSTRUCTION SURGERY  1970   BOTTLE CUT NOSE AND EYE, MULTIPLE SURGERIES   FINGER SURGERY Left 2015   THUMB   FINGER SURGERY Right 1966   MIDDLE   FOOT SURGERY Right 9371   FUNDIPLICATION  6967   HERNIA REPAIR Left 1981/1957   RIGHT SIDE IN 1957   KNEE ARTHROPLASTY Left 02/10/2020   Procedure: COMPUTER ASSISTED TOTAL KNEE ARTHROPLASTY - RNFA;  Surgeon: Dereck Leep, MD;  Location: [facility];  Service: Orthopedics;  Laterality: Left;   MASS EXCISION Left 02/16/2015   Procedure: LEFT BURSA ELBOW EXCISION;  Surgeon: Leanor Kail, MD;  Location: [place];  Service: Orthopedics;   Laterality: Left;   NISSEN FUNDOPLICATION  8938   NOSE SURGERY  12/01/02   SEPTUM RECONSTRUCTION   POLYPECTOMY  12/18/2014   Procedure: POLYPECTOMY;  Surgeon: Lucilla Lame, MD;  Location: [place];  Service: Endoscopy;;   SEPTOPLASTY  [place] Left 1979   WITH ELBOW (TENDON RELEASE)   SKIN SURGERY  2004   SKIN CANCER NOSE    Home Medications:  Allergies as of 07/26/2021   No Known Allergies      Medication List        Accurate as of July 26, 2021 10:42 AM. If you have any questions, ask your nurse or doctor.          acetaminophen 650 MG CR tablet Commonly known as: TYLENOL Take 650 mg by mouth every 8 (eight) hours as needed for pain.   amLODipine 10 MG tablet Commonly known as: NORVASC Take 10 mg by mouth daily.   buPROPion 300 MG 24 hr tablet Commonly known as: WELLBUTRIN XL Take 300 mg by mouth daily.   celecoxib 200 MG capsule Commonly known as: CELEBREX Take 200 mg by mouth 2 (two) times daily.   Cholecalciferol 125 MCG (5000 UT) Tabs Take 5,000 Units by mouth daily.   clonazePAM 0.5 MG tablet Commonly known as: KLONOPIN Take 0.5-1 mg by mouth 2 (two) times daily as needed for anxiety.   cyclobenzaprine 10 MG tablet Commonly known as: FLEXERIL Take 10 mg by mouth 3 (three) times daily as needed for muscle spasms.   DULoxetine 60 MG capsule Commonly known as: CYMBALTA TAKE 1 CAPSULE(60 MG) BY MOUTH EVERY DAY   fluorouracil 5 % cream Commonly known as: EFUDEX Apply topically 2 (two) times daily. For 7 days to top of both hands   fluticasone 50 MCG/ACT nasal spray Commonly known as: FLONASE Place 2 sprays into both nostrils daily as needed for allergies.   furosemide 40 MG tablet Commonly known as: LASIX Take 40 mg by mouth daily as needed for fluid.   Lumify 0.025 % Soln Generic drug: Brimonidine Tartrate Place 1 drop into both eyes 2 (two) times daily as needed (redness).    multivitamin with minerals Tabs tablet Take 1 tablet by mouth daily.   NEEDLE (DISP) 23 G 23G X 1" Misc Use 1 Units every 10 (ten) days.   sodium fluoride 1.1 % Gel dental gel Commonly known as: FLUORISHIELD Place 1 application onto teeth daily.   spironolactone 25 MG tablet Commonly known as: ALDACTONE Take 25 mg by mouth daily. AM   tamsulosin 0.4 MG Caps capsule Commonly known as: FLOMAX Take 0.4 mg by mouth daily.   testosterone cypionate  200 MG/ML injection Commonly known as: DEPOTESTOSTERONE CYPIONATE INJECT 0.5 MLS INTO THE MUSCLE ONCE A WEEK   traZODone 50 MG tablet Commonly known as: DESYREL Take 50 mg by mouth at bedtime as needed for sleep.   vitamin B-12 1000 MCG tablet Commonly known as: CYANOCOBALAMIN Take 2,000 mcg by mouth daily.        Allergies: No Known Allergies  Family History: Family History  Problem Relation Age of Onset   Arthritis Mother    Asthma Mother    Cancer Mother    Heart disease Father     Social History:  reports that he has never smoked. He has never used smokeless tobacco. He reports current alcohol use of about 1.0 standard drink per week. He reports that he does not use drugs.   Physical Exam: BP (!) 122/92    Pulse 86    Ht 5\' 9"  (1.753 m)    Wt 220 lb (99.8 kg)    BMI 32.49 kg/m   Constitutional:  Alert and oriented, No acute distress. HEENT: [place] AT, moist mucus membranes.  Trachea midline, no masses. Cardiovascular: No clubbing, cyanosis, or edema. Respiratory: Normal respiratory effort, no increased work of breathing. Psychiatric: Normal mood and affect.   Assessment & Plan:    1. Benign prostatic hyperplasia with lower urinary tract symptoms, symptom details unspecified Doing well status post UroLift Discontinue tamsulosin Follow-up 1 year with PVR  2.  Hypogonadism Lab visit 6 months testosterone, PSA Office visit 1 year Testosterone refilled   Abbie Sons, [facility] [address], [place], [place] [address] [phone]

## 2021-08-01 ENCOUNTER — Other Ambulatory Visit: Payer: Self-pay

## 2021-08-01 ENCOUNTER — Ambulatory Visit
Admission: RE | Admit: 2021-08-01 | Discharge: 2021-08-01 | Disposition: A | Discharge status: Home / Self Care | Payer: Medicare Other | Source: Ambulatory Visit | Attending: Orthopedic Surgery | Admitting: Orthopedic Surgery

## 2021-08-01 DIAGNOSIS — M25561 Pain in right knee: Secondary | ICD-10-CM | POA: Insufficient documentation

## 2021-08-01 DIAGNOSIS — Z96652 Presence of left artificial knee joint: Secondary | ICD-10-CM | POA: Diagnosis present

## 2021-08-09 ENCOUNTER — Other Ambulatory Visit: Payer: Self-pay

## 2021-08-09 ENCOUNTER — Inpatient Hospital Stay: Payer: Medicare Other

## 2021-08-09 ENCOUNTER — Inpatient Hospital Stay (HOSPITAL_BASED_OUTPATIENT_CLINIC_OR_DEPARTMENT_OTHER): Payer: Medicare Other | Admitting: Internal Medicine

## 2021-08-09 ENCOUNTER — Encounter: Payer: Self-pay | Admitting: Internal Medicine

## 2021-08-09 ENCOUNTER — Inpatient Hospital Stay: Payer: Medicare Other | Attending: Internal Medicine

## 2021-08-09 VITALS — BP 149/109 | HR 105

## 2021-08-09 VITALS — BP 145/110 | HR 89 | Temp 98.5°F | Ht 69.0 in | Wt 224.8 lb

## 2021-08-09 DIAGNOSIS — Z79899 Other long term (current) drug therapy: Secondary | ICD-10-CM | POA: Insufficient documentation

## 2021-08-09 DIAGNOSIS — M549 Dorsalgia, unspecified: Secondary | ICD-10-CM | POA: Diagnosis not present

## 2021-08-09 DIAGNOSIS — D751 Secondary polycythemia: Secondary | ICD-10-CM | POA: Insufficient documentation

## 2021-08-09 DIAGNOSIS — G8929 Other chronic pain: Secondary | ICD-10-CM | POA: Insufficient documentation

## 2021-08-09 LAB — HEMOGLOBIN AND HEMATOCRIT, BLOOD
HCT: 52.4 % — ABNORMAL HIGH (ref 39.0–52.0)
Hemoglobin: 18.2 g/dL — ABNORMAL HIGH (ref 13.0–17.0)

## 2021-08-09 NOTE — Progress Notes (Signed)
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   Awaiting left meniscus tear surgery. Chronic mild fatigue.  Denies any worsening shortness of breath or cough.  Chronic back pain not any worse.  No headaches.  Review of Systems  Constitutional:  Positive for malaise/fatigue. Negative for chills, diaphoresis, fever and weight loss.  HENT:  Negative for nosebleeds and sore throat.   Eyes:  Negative for double vision.  Respiratory:  Negative for cough, hemoptysis, sputum production, shortness of breath and wheezing.   Cardiovascular:  Negative for chest pain, palpitations, orthopnea and leg swelling.  Gastrointestinal:  Negative for abdominal pain, blood in stool, constipation, diarrhea, heartburn, melena, nausea and vomiting.  Genitourinary:  Negative for dysuria, frequency and urgency.  Musculoskeletal:  Positive for back pain and joint pain.  Skin:  Negative for itching.  Neurological:  Negative for dizziness, tingling, focal weakness, weakness and headaches.  Endo/Heme/Allergies:  Does not bruise/bleed easily.  Psychiatric/Behavioral:  Negative for depression. The patient is not nervous/anxious and does not have insomnia.     PAST MEDICAL HISTORY :  Past Medical History:  Diagnosis Date   Actinic keratosis    Anxiety    Arthritis    Back pain    MULTIPLE BACK SURGERIES   Basal cell carcinoma 10/09/2016   L sup med scapula    Basal cell carcinoma 07/11/2019   L lat forehead above lat brow    Basal cell carcinoma 08/29/2020   R prox medial pretibial,  ED[facility]   Basal cell carcinoma 08/29/2020   R posterior ear, [facility]   BCC (basal cell carcinoma of skin) 12/27/2020   R epigastric - ED&C   Benign essential HTN 12/07/2014   Last Assessment & Plan:  Relevant Hx: Course: Daily Update: Today's Plan: CONTROLLED ON MEDS   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/31/2013   Chronic back pain    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   Cirrhosis ([place])    DDD (degenerative disc disease), cervical 11/06/2014   DDD (degenerative disc disease), lumbar 11/06/2014   Depression    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 Jacqlyn Larsen is his urologist.     PAST SURGICAL HISTORY :   Past Surgical History:  Procedure Laterality Date   BACK SURGERY  04/14/13   L2-S1   Cancer of right ear     3/17   CATARACT EXTRACTION Left 1989   CERVICAL SPINE SURGERY  X2 FUSION C4-7   CHOLECYSTECTOMY  07/2013   COLONOSCOPY WITH PROPOFOL N/A 12/18/2014   Procedure: COLONOSCOPY WITH PROPOFOL;  Surgeon: Lucilla Lame, MD;  Location: [facility];  Service: Endoscopy;  Laterality: N/A;   CYSTOSCOPY WITH INSERTION OF UROLIFT N/A 04/02/2021   Procedure: CYSTOSCOPY WITH INSERTION OF UROLIFT;  Surgeon: Abbie Sons, MD;  Location: [facility];  Service: Urology;  Laterality: N/A;   ELBOW ARTHROSCOPY Right 02/2012   ESOPHAGOGASTRODUODENOSCOPY (EGD) WITH  PROPOFOL N/A 12/18/2014   Procedure: ESOPHAGOGASTRODUODENOSCOPY (EGD) WITH PROPOFOL with dialtion;  Surgeon: Lucilla Lame, MD;  Location: [place];  Service: Endoscopy;  Laterality: N/A;   FACIAL RECONSTRUCTION SURGERY  1970   BOTTLE CUT NOSE AND EYE, MULTIPLE SURGERIES   FINGER SURGERY Left 2015   THUMB   FINGER SURGERY Right 1966   MIDDLE   FOOT SURGERY Right 1610   FUNDIPLICATION  9604   HERNIA REPAIR Left 1981/1957   RIGHT SIDE IN 1957   KNEE ARTHROPLASTY Left 02/10/2020   Procedure: COMPUTER ASSISTED TOTAL KNEE ARTHROPLASTY - RNFA;  Surgeon: Dereck Leep, MD;  Location: [facility];  Service: Orthopedics;  Laterality: Left;   MASS EXCISION Left 02/16/2015   Procedure: LEFT BURSA ELBOW EXCISION;  Surgeon: Leanor Kail, MD;  Location: [place];  Service: Orthopedics;  Laterality: Left;   NISSEN FUNDOPLICATION  5409   NOSE SURGERY  12/01/02   SEPTUM RECONSTRUCTION   POLYPECTOMY  12/18/2014   Procedure: POLYPECTOMY;  Surgeon: Lucilla Lame, MD;  Location: [place];  Service: Endoscopy;;   SEPTOPLASTY  1999   SHOULDER SURGERY Right 1983   SHOULDER SURGERY Left 1979   WITH ELBOW (TENDON RELEASE)   SKIN SURGERY  2004   SKIN CANCER NOSE    FAMILY HISTORY :   Family History  Problem Relation Age of Onset   Arthritis Mother    Asthma Mother    Cancer Mother    Heart disease Father     SOCIAL HISTORY:   Social History   Tobacco Use   Smoking status: Never   Smokeless tobacco: Never  Vaping Use   Vaping Use: Never used  Substance Use Topics   Alcohol use: Yes    Alcohol/week: 1.0 standard drink    Types: 1 Glasses of wine per week   Drug use: No    ALLERGIES:  has No Known Allergies.  MEDICATIONS:  Current Outpatient Medications  Medication Sig Dispense Refill   acetaminophen (TYLENOL) 650 MG CR tablet Take 650 mg by mouth every 8 (eight) hours as needed for pain.     amLODipine (NORVASC) 10 MG tablet Take 10 mg by mouth daily.       Brimonidine Tartrate (LUMIFY) 0.025 % SOLN Place 1 drop into both eyes 2 (two) times daily as needed (redness).     buPROPion (WELLBUTRIN XL) 300 MG 24 hr tablet Take 300 mg by mouth daily.     celecoxib (CELEBREX) 200 MG capsule Take 200 mg by mouth 2 (two) times daily.     Cholecalciferol 125 MCG (5000 UT) TABS Take 5,000 Units by mouth daily.     clonazePAM (KLONOPIN) 0.5 MG tablet Take 0.5-1 mg by mouth 2 (two) times daily as needed for anxiety.     cyclobenzaprine (FLEXERIL) 10 MG tablet Take 10 mg by mouth 3 (three) times daily as needed for muscle spasms.     DULoxetine (CYMBALTA) 60 MG capsule TAKE 1 CAPSULE(60 MG) BY MOUTH EVERY DAY     fluorouracil (  EFUDEX) 5 % cream Apply topically 2 (two) times daily. For 7 days to top of both hands 15 g 1   fluticasone (FLONASE) 50 MCG/ACT nasal spray Place 2 sprays into both nostrils daily as needed for allergies.     furosemide (LASIX) 40 MG tablet Take 40 mg by mouth daily as needed for fluid.      Multiple Vitamin (MULTIVITAMIN WITH MINERALS) TABS tablet Take 1 tablet by mouth daily.     NEEDLE, DISP, 23 G 23G X 1" MISC Use 1 Units every 10 (ten) days.     sodium fluoride (FLUORISHIELD) 1.1 % GEL dental gel Place 1 application onto teeth daily.     spironolactone (ALDACTONE) 25 MG tablet Take 25 mg by mouth daily. AM     testosterone cypionate (DEPOTESTOSTERONE CYPIONATE) 200 MG/ML injection ADMINISTER 0.5 ML IN THE MUSCLE 1 TIME A WEEK 10 mL 0   traZODone (DESYREL) 50 MG tablet Take 50 mg by mouth at bedtime as needed for sleep.     vitamin B-12 (CYANOCOBALAMIN) 1000 MCG tablet Take 2,000 mcg by mouth daily.     No current facility-administered medications for this visit.    PHYSICAL EXAMINATION:   BP (!) 145/110 (BP Location: Left Arm, Patient Position: Sitting, Cuff Size: Normal)    Pulse 89    Temp 98.5 F (36.9 C) (Tympanic)    Ht 5\' 9"  (1.753 m)    Wt 224 lb 12.8 oz (102 kg)    SpO2 94%    BMI 33.20 kg/m   Filed Weights   08/09/21  1322  Weight: 224 lb 12.8 oz (102 kg)    Physical Exam HENT:     Head: Normocephalic and atraumatic.     Mouth/Throat:     Pharynx: No oropharyngeal exudate.  Eyes:     Pupils: Pupils are equal, round, and reactive to light.  Cardiovascular:     Rate and Rhythm: Normal rate and regular rhythm.  Pulmonary:     Effort: No respiratory distress.     Breath sounds: No wheezing.  Abdominal:     General: Bowel sounds are normal. There is no distension.     Palpations: Abdomen is soft. There is no mass.     Tenderness: There is no abdominal tenderness. There is no guarding or rebound.  Musculoskeletal:        General: No tenderness. Normal range of motion.     Cervical back: Normal range of motion and neck supple.  Skin:    General: Skin is warm.     Comments: Erythematous rash noted on the face/skin ointment for precancerous skin lesions.  Neurological:     Mental Status: He is alert and oriented to person, place, and time.  Psychiatric:        Mood and Affect: Affect normal.    LABORATORY DATA:  I have reviewed the data as listed    Component Value Date/Time   NA 138 02/02/2020 1202   NA 137 02/22/2012 0645   K 4.0 02/02/2020 1202   K 4.6 02/22/2012 0645   CL 103 02/02/2020 1202   CL 103 02/22/2012 0645   CO2 24 02/02/2020 1202   CO2 30 02/22/2012 0645   GLUCOSE 94 02/02/2020 1202   GLUCOSE 90 02/22/2012 0645   BUN 18 02/02/2020 1202   BUN 9 02/22/2012 0645   CREATININE 0.87 02/02/2020 1202   CREATININE 0.97 02/22/2012 0645   CALCIUM 8.9 02/02/2020 1202   CALCIUM 8.5 02/22/2012 0645   PROT  7.5 02/02/2020 1202   PROT 6.9 02/22/2012 0645   ALBUMIN 4.4 02/02/2020 1202   ALBUMIN 3.1 (L) 02/22/2012 0645   AST 38 02/02/2020 1202   AST 80 (H) 02/22/2012 0645   ALT 32 02/02/2020 1202   ALT 96 (H) 02/22/2012 0645   ALKPHOS 34 (L) 02/02/2020 1202   ALKPHOS 47 (L) 02/22/2012 0645   BILITOT 1.0 02/02/2020 1202   BILITOT 0.3 02/22/2012 0645   GFRNONAA >60 02/02/2020 1202    GFRNONAA >60 02/22/2012 0645   GFRAA >60 02/02/2020 1202   GFRAA >60 02/22/2012 0645    No results found for: SPEP, UPEP  Lab Results  Component Value Date   WBC 4.8 02/02/2020   NEUTROABS 2.8 04/18/2016   HGB 18.2 (H) 08/09/2021   HCT 52.4 (H) 08/09/2021   MCV 94.5 02/02/2020   PLT 172 02/02/2020      Chemistry      Component Value Date/Time   NA 138 02/02/2020 1202   NA 137 02/22/2012 0645   K 4.0 02/02/2020 1202   K 4.6 02/22/2012 0645   CL 103 02/02/2020 1202   CL 103 02/22/2012 0645   CO2 24 02/02/2020 1202   CO2 30 02/22/2012 0645   BUN 18 02/02/2020 1202   BUN 9 02/22/2012 0645   CREATININE 0.87 02/02/2020 1202   CREATININE 0.97 02/22/2012 0645      Component Value Date/Time   CALCIUM 8.9 02/02/2020 1202   CALCIUM 8.5 02/22/2012 0645   ALKPHOS 34 (L) 02/02/2020 1202   ALKPHOS 47 (L) 02/22/2012 0645   AST 38 02/02/2020 1202   AST 80 (H) 02/22/2012 0645   ALT 32 02/02/2020 1202   ALT 96 (H) 02/22/2012 0645   BILITOT 1.0 02/02/2020 1202   BILITOT 0.3 02/22/2012 0645        ASSESSMENT & PLAN:   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   # Today the hematocrit is 52. Recommend phlebotomy if HCT > 50. Patient denies any improvement of his symptoms post phlebotomy. Proceed with phlebotomy today.   # Back pain-s/p surgery improved. STABLE.   # Hypogonadism- on testosterone [Dr.Stoioff]- STABLE.  Recommend continuing testosterone given his history of hypogonadism.  # DISPOSITION: # proceed with Phlebotomy #  H&H q 3 M- x4/possible phlebotomy; # Follow up  In 6 month with MD- H&H-Dr.B     Cammie Sickle, MD 08/09/2021 4:24 PM

## 2021-08-09 NOTE — Assessment & Plan Note (Addendum)
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  ? ?# Today the hematocrit is 52. Recommend phlebotomy if HCT > 50. Patient denies any improvement of his symptoms post phlebotomy. Proceed with phlebotomy today.  ? ?# Back pain-s/p surgery improved. STABLE.  ? ?# Hypogonadism- on testosterone [Dr.Stoioff]- STABLE.  Recommend continuing testosterone given his history of hypogonadism. ? ?# DISPOSITION: ?# proceed with Phlebotomy ?#  H&H q 3 M- x4/possible phlebotomy; ?# Follow up  In 12 month with MD- H&H-Dr.B ?

## 2021-08-09 NOTE — Progress Notes (Signed)
Therapeutic phlebotomy performed in [facility] using 20g angiocath. 347mL removed; pt tolerated procedure well. Vital signs stable at discharge.  ?

## 2021-08-09 NOTE — Progress Notes (Signed)
Pt is having rt meniscus repair next week. ?

## 2021-08-10 NOTE — H&P (Signed)
ORTHOPAEDIC HISTORY & PHYSICAL Matti Minney, Florinda Marker., MD - 08/07/2021 3:30 PM EST Formatting of this note is different from the original. Images from the original note were not included. Chief Complaint: Chief Complaint  Patient presents with   Right knee meniscus tear, MRI 08/01/21 results   Reason for Visit: The patient is a 71 y.o. male who presents today for reevaluation of his right knee. He has a 1 month history of right knee pain. He twisted and felt a "pop" to the right knee while walking. The knee essentially "gave way", although he did not sustain a complete fall. He noted the onset of significant pain and swelling. He was initially evaluated by Reche Dixon, PA-C. He localizes most of the pain along the medial aspect of the knee. He reports significant swelling, no locking, and some giving way of the knee. He reports difficulty with fully extending the knee. The pain is aggravated by any weight bearing, lateral movements and pivoting. The patient has not appreciated any significant improvement despite cold therapy, Tylenol, activity modification, knee sleeve, and NSAIDs. The right knee symptoms have actually increased in severity.  Medications: Current Outpatient Medications  Medication Sig Dispense Refill   amLODIPine (NORVASC) 10 MG tablet TAKE 1 TABLET BY MOUTH EVERY DAY 90 tablet 3   amoxicillin (AMOXIL) 500 MG capsule Take 2,000 mg by mouth once - 1 hour before dental appt   brimonidine (LUMIFY) 0.025 % Drop Apply 1 drop to eye once daily as needed   buPROPion (WELLBUTRIN XL) 300 MG XL tablet Take 1 tablet (300 mg total) by mouth once daily 90 tablet 3   celecoxib (CELEBREX) 200 MG capsule TAKE 1 CAPSULE(200 MG) BY MOUTH TWICE DAILY 180 capsule 0   clonazePAM (KLONOPIN) 0.5 MG tablet TAKE 1 TABLET(0.5 MG) BY MOUTH TWICE DAILY AS NEEDED 30 tablet 0   cyclobenzaprine (FLEXERIL) 10 MG tablet Take 1 tablet (10 mg total) by mouth 3 (three) times daily 90 tablet 2   DULoxetine  (CYMBALTA) 60 MG DR capsule TAKE 1 CAPSULE(60 MG) BY MOUTH EVERY DAY 90 capsule 3   FUROsemide (LASIX) 40 MG tablet TAKE 1 TABLET BY MOUTH EVERY DAY AS NEEDED FOR SWELLING 90 tablet 3   multivitamin tablet Take 1 tablet by mouth once daily   PREVIDENT 5000 PLUS 1.1 % BRUSH TEETH WITH PEASIZED AMOUNT AT BEDTIME. SPIT ACCESS SEVERAL TIMES BUT DO NOT RINSE   spironolactone (ALDACTONE) 25 MG tablet TAKE 1 TABLET BY MOUTH EVERY DAY 90 tablet 1   syringe with needle 3 mL 23 x 1" Syrg USE AS DIRECTED FOR TESTOSTERONE INJECTION 100 Syringe 2   tamsulosin (FLOMAX) 0.4 mg capsule Take 1 capsule (0.4 mg total) by mouth 2 (two) times daily Take 30 minutes after same meal each day. 180 capsule 3   testosterone cypionate (DEPO-TESTOSTERONE) 200 mg/mL injection INJECT 0.6 ML IN THE MUSCLE ONCE A WEEK (Patient taking differently: 0.91m intramuscular once a week) 10 mL 0   traZODone (DESYREL) 50 MG tablet TAKE 1 TABLET BY MOUTH EVERY NIGHT 90 tablet 3   No current facility-administered medications for this visit.   Allergies: No Known Allergies  Past Medical History: Past Medical History:  Diagnosis Date   Apnea, sleep 12/07/2014   Back pain   Benign neoplasm of cecum 09/29/2019   Cancer (CMS-HCC)  skin cancer   Carpal tunnel syndrome   Chronic hepatitis C without hepatic coma (CMS-HCC) 12/18/2019  Treated most frequently in 2016 with good results.   Chronic hepatitis  C without mention of hepatic coma (CMS-HCC)  from transfusion; treated 2006 with interferon with relapse treated with Harvoni in 2016   Colonic polyp   DDD (degenerative disc disease), cervical 11/06/2014   Depressive disorder, not elsewhere classified   Encounter for blood transfusion   GERD (gastroesophageal reflux disease)   Gout, joint   Hydronephrosis with urinary obstruction due to ureteral calculus 09/30/2016   Hypertension   Nephrolithiasis 06/10/2017   Osteoarthrosis, unspecified whether generalized or localized, unspecified site    Peripheral neuropathy  severe in feet   Reflux esophagitis   Renal colic 3/55/9741   Testicular dysfunction   Ventral hernia   Past Surgical History: Past Surgical History:  Procedure Laterality Date   RECONSTRUCTION OF NOSE N/A 1970  S/P surgical repair with skin grafting   EYELID REPAIR W/ SKIN GRAFT Left 1970   RADIOFREQUENCY ABLATION SPINAL NERVES 2004   COLONOSCOPY 07/22/2010  PH Adenomatous Polyps: CBF 07/2015; Colon 12/18/2014 w/Dr. Allen Norris MD (dw)   LAMINECTOMY POSTERIOR LUMBAR FACETECTOMY & FORAMINOTOMY W/DECOMP N/A 04/14/2013  Procedure: LAMINECTOMY POSTERIOR LUMBAR FACETECTOMY & FORAMINOTOMY W/DECOMP c-arm Mccullough retractors Microscope and instruments patient prone, wilson frame on regular table anspach - [id number] bit ; Surgeon: Donnamarie Rossetti, MD; Location: [facility]; Service: Neurosurgery; Laterality: N/A;   LAMINECTOMY POSTERIOR CERVICLE DECOMP W/FACETECTOMY & FORAMINOTOMY N/A 04/14/2013  Procedure: LAMINEC/FACETECT/FORAMIN,EACH ADDNL; Surgeon: Donnamarie Rossetti, MD; Location: [facility]; Service: Neurosurgery; Laterality: N/A;   MICROSURGERY N/A 04/14/2013  Procedure: MICROSURGERY; Surgeon: Donnamarie Rossetti, MD; Location: [facility]; Service: Neurosurgery; Laterality: N/A;   COLONOSCOPY 12/18/2014  Dr. Allen Norris MD   EGD 12/18/2014  Dr. Allen Norris MD   OLECRANON BURSA EXCISION Left 02/16/2015   POSTERIOR LUMBAR SPINE FUSION ONE LEVEL LATERAL TRANSVERSE TECHNIQUE Bilateral 11/13/2017  Procedure: BILATERAL LUMBAR DECOMPRESSION, STABILIZATION SPINAL FUSION L3-4, L4-5, L5-S1. RIGHT ICBG - VIVIGEN ALLOGRAFT.; Surgeon: Loni Dolly, MD; Location: [facility]; Service: Orthopedics; Laterality: Bilateral;   INSTRUMENTATION POSTERIOR SPINE 3 TO 6 VERTEBRAL SEGMENTS Bilateral 11/13/2017  Procedure: POSTERIOR SEGMENTAL INSTRUMENTATION (EG, PEDICLE FIXATION, DUAL RODS WITH MULTIPLE HOOKS AND SUBLAMINAR WIRES); 3 TO 6 VERTEBRAL SEGMENTS; Surgeon: Loni Dolly, MD; Location: [place]; Service: Orthopedics; Laterality: Bilateral;   LAMINECTOMY POSTERIOR LUMBAR FACETECTOMY & FORAMINOTOMY W/DECOMP Bilateral 11/13/2017  Procedure: LAMINECTOMY, FACETECTOMY AND FORAMINOTOMY (UNILATERAL OR BILATERAL WITH DECOMPRESSION OF SPINAL CORD, CAUDA EQUINA AND/OR NERVE ROOT(S), SINGLE VERTEBRAL SEGMENT; LUMBAR; Surgeon: Loni Dolly, MD; Location: [place] [facility]; Service: Orthopedics; Laterality: Bilateral;   AUTOGRAFT MORSELIZED OBTAINED SEPARATE INCISION FOR SPINE SURGERY Right 11/13/2017  Procedure: AUTOGRAFT FOR SPINE SURGERY ONLY (INCLUDES HARVESTING THE GRAFT); MORSELIZED (THROUGH SEPARATE SKIN OR FASCIAL INCISION); Surgeon: Loni Dolly, MD; Location: [place]; Service: Orthopedics; Laterality: Right;   LAMINECTOMY POSTERIOR CERVICLE DECOMP W/FACETECTOMY & FORAMINOTOMY Bilateral 11/13/2017  Procedure: LAMINECTOMY, FACETECTOMY AND FORAMINOTOMY, SINGLE VERTEBRAL SEGMENT; EACH ADDITIONAL SEGMENT, CERVICAL, THORACIC, OR LUMBAR; [address]3845 x2; Surgeon: Loni Dolly, MD; Location: [facility]; Service: Orthopedics; Laterality: Bilateral;   Left total knee arthroplasty using computer-assisted navigation 02/10/2020  Dr Marry Guan   ANTERIOR FUSION CERVICAL SPINE  x 2; 3/4, 4/5, 5/6   ARTHRODESIS POSTERIOR LUMBAR SPINE W/LAMINECTOMY/DISCECTOMY   ARTHROSCOPY SHOULDER Bilateral   BACK SURGERY   CHOLECYSTECTOMY   COLONOSCOPY 11/29/2004, 03/11/2004  Adenomatous Polyps   EGD 03/17/2013, 07/22/2010, 03/11/2004, 04/10/2000  No repeat per RTE   ELBOW ARTHROSCOPY Bilateral   EXTRACTION CATARACT EXTRACAPSULAR W/INSERTION INTRAOCULAR PROSTHESIS Left   FRACTURE SURGERY   INGUINAL HERNIA REPAIR Bilateral   LAPAROSCOPIC ESOPHAGOGASTRIC FUNDOPLASTY (  NISSEN PROCEDURE)   MOHS SURGERY  nose   POSTERIOR FUSION CERVICAL SPINE  x 2 (C3-C6)   SEPTOPLASTY  multiple   Social History: Social History   Socioeconomic History   Marital status: Married  Spouse name: Morey Hummingbird   Number of  children: 2   Years of education: 12   Highest education level: High school graduate  Occupational History   Occupation: RetiredRunner, broadcasting/film/video / EMT  Tobacco Use   Smoking status: Never   Smokeless tobacco: Former  Types: Chew  Quit date: 06/10/1983  Vaping Use   Vaping Use: Never used  Substance and Sexual Activity   Alcohol use: Yes  Alcohol/week: 7.0 standard drinks  Types: 7 Standard drinks or equivalent per week  Comment: 7 drinks per week   Drug use: No   Sexual activity: Yes  Partners: Female  Birth control/protection: None  Social History Narrative  Lives with spouse. 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   Family History: Family History  Problem Relation Age of Onset   Lung cancer Mother   Coronary Artery Disease (Blocked arteries around heart) Father   Myocardial Infarction (Heart attack) Father 53   Coronary Artery Disease (Blocked arteries around heart) Sister   Depression Sister   Gout Brother   Anesthesia problems Neg Hx   Malignant hyperthermia Neg Hx   Review of Systems: A comprehensive 14 point ROS was performed, reviewed, and the pertinent orthopaedic findings are documented in the HPI.  Exam BP 138/76   Ht 175.3 cm ('5\' 9"'$ )   Wt (!) 102.5 kg (226 lb)   BMI 33.37 kg/m   General:  Well-developed, well-nourished male seen in no acute distress.  Antalgic gait.  No varus or valgus thrust to the right knee.  HEENT:  Atraumatic, normocephalic. Pupils are equal and reactive to light. Extraocular motion is intact. Sclera are clear. Oropharynx is clear with moist mucosa.  Lungs:  Clear to auscultation bilaterally.  Cardiovascular: Regular rate and rhythm. Normal S1, S2. No murmur . No appreciable gallops or rubs. Peripheral pulses are palpable. No lower extremity edema. Homan`s test is negative.   Extremities: Good strength, stability, and range of motion of the upper extremities. Good range of  motion of the hips and ankles.  Right Knee:  Soft tissue swelling: moderate Effusion: mild Erythema: none Crepitance: mild Tenderness: medial Alignment: normal Mediolateral laxity: stable Anterior drawer test:negative Lachman`s test: negative McMurray`s test: positive Atrophy: No significant atrophy.  Quadriceps tone was good. Range of Motion: 0/7/120 degrees  Neurologic:  Awake, alert, and oriented.  Sensory function is intact to pinprick and light touch.  Motor strength is judged to be 5/5.  Motor coordination is within normal limits.  No apparent clonus. No tremor.   MRI: I reviewed the right knee MRI from [facility] dated 08/01/2021. I concur with the radiologist's interpretation as below:  MRI OF THE RIGHT KNEE WITHOUT CONTRAST   TECHNIQUE:  Multiplanar, multisequence MR imaging of the knee was performed. No  intravenous contrast was administered.   COMPARISON:  None.   FINDINGS:  MENISCI   Medial meniscus: There is a complex tear in the posterior horn which  includes a large radial component along the free edge and a  horizontal component reaching the meniscal undersurface. The body of  the medial meniscus is extruded peripherally.   Lateral meniscus:  Intact.   LIGAMENTS   Cruciates:  Intact.   Collaterals:  Intact.  CARTILAGE   Patellofemoral: Cartilage is diffusely thinned without focal defect.   Medial:  Thinned and frayed without focal defect.   Lateral:  Mildly degenerated.   Joint: Moderate joint effusion. Loose bodies in the joint include a  1.4 cm body in the anterior aspect of the lateral compartment and a  0.7 cm loose body anterior to the ACL.   Popliteal Fossa:  No Baker's cyst.   Extensor Mechanism:  Intact.   Bones: Bone contusions are seen in the femoral condyles, more  extensive laterally. Small area of marrow edema is also seen in the  tibial eminences. Osteophytosis is present about the knee. No   fracture.   Other: None.   IMPRESSION:  Complex tear posterior horn medial meniscus.   Bone contusions in the distal femur and proximal tibia. Negative for  fracture.   Osteoarthritis about the knee. 2 large loose bodies are seen in the  anterior aspect of the joint.   Electronically Signed    By: Inge Rise M.D.    On: 08/03/2021 08:14  Impression: Internal derangement of the right knee  Plan:  The findings were discussed in detail with the patient. The patient was given informational material on knee arthroscopy. Conservative treatment options were reviewed with the patient. We discussed the risks and benefits of surgical intervention. The usual perioperative course was also discussed in detail. The patient expressed understanding of the risks and benefits of surgical intervention and would like to proceed with plans for right knee arthroscopy.  I spent a total of 40 minutes in both face-to-face and non-face-to-face activities, excluding procedures performed, for this visit on the date of this encounter.  MEDICAL CLEARANCE: Per anesthesiology. ACTIVITIES:  Avoid pivoting, squatting, or twisting. WORK STATUS: Not applicable. THERAPY: Quadriceps strengthening exercises. MEDICATIONS: Requested Prescriptions   No prescriptions requested or ordered in this encounter   FOLLOW-UP: Return for postoperative follow-up.   Ermalee Mealy P. Holley Bouche., M.D.  This note was generated in part with voice recognition software and I apologize for any typographical errors that were not detected and corrected.  Electronically signed by Lamar Benes., MD at 08/07/2021 10:53 PM EST

## 2021-08-10 NOTE — Discharge Instructions (Addendum)
Instructions after Knee Arthroscopy    James P. Hooten, Jr., M.D.     Dept. of Orthopaedics & Sports Medicine  [facility]  [address]  [place], [place]  [address]   Phone: [phone]   Fax: [phone]   DIET: Drink plenty of non-alcoholic fluids & begin a light diet. Resume your normal diet the day after surgery.  ACTIVITY:  You may use crutches or a walker with weight-bearing as tolerated, unless instructed otherwise. You may wean yourself off of the walker or crutches as tolerated.  Begin doing gentle exercises. Exercising will reduce the pain and swelling, increase motion, and prevent muscle weakness.   Avoid strenuous activities or athletics for a minimum of 4-6 weeks after arthroscopic surgery. Do not drive or operate any equipment until instructed.  WOUND CARE:  Place one to two pillows under the knee the first day or two when sitting or lying.  Continue to use the ice packs periodically to reduce pain and swelling. The small incisions in your knee are closed with nylon stitches. The stitches will be removed in the office. The bulky dressing may be removed on the second day after surgery. DO NOT TOUCH THE STITCHES. Put a Band-Aid over each stitch. Do NOT use any ointments or creams on the incisions.  You may bathe or shower after the stitches are removed at the first office visit following surgery.  MEDICATIONS: You may resume your regular medications. Please take the pain medication as prescribed. Do not take pain medication on an empty stomach. Do not drive or drink alcoholic beverages when taking pain medications.  CALL THE OFFICE FOR: Temperature above 101 degrees Excessive bleeding or drainage on the dressing. Excessive swelling, coldness, or paleness of the toes. Persistent nausea and vomiting.  FOLLOW-UP:  You should have an appointment to return to the office in 7-10 days after surgery.      [facility] Department Directory          [email or web address]       [email or web address]          Cardiology  Appointments: [place] - [phone] [place] - [phone]  Endocrinology  Appointments: [place] - [phone] [place] - [phone]  Gastroenterology  Appointments: [place] - [phone] [place] - [phone]        General Surgery   Appointments: [place] - [phone]  Internal Medicine/Family Medicine  Appointments: [place] - [phone] [place] - [phone] [place] - [phone]  Metabolic and Weigh Loss Surgery  Appointments: [place] - [phone]        Neurology  Appointments: [place] - [phone] [place] - [phone]  Neurosurgery  Appointments: [place] - [phone]  Obstetrics & Gynecology  Appointments: [place] - [phone] [place] - [phone]        Pediatrics  Appointments: [place] - [phone] [place] - [phone]  Physiatry  Appointments: [place] -[phone]  Physical Therapy  Appointments: [place] - [phone] [place] - [phone]        Podiatry  Appointments: [place] - [phone] [place] - [phone]  Pulmonology  Appointments: [place] - [phone]  Rheumatology  Appointments: [place] - [phone]         Location: [facility]  [address] , [place]  [address]  [place] Location: [facility] [address] [place], [place]  [address]  [place] Location: [facility] [address] [place], [place]  [address]      AMBULATORY SURGERY  DISCHARGE INSTRUCTIONS   The drugs that you were given will stay in your system until tomorrow so for the next 24   hours you should not:  Drive an automobile Make any legal decisions Drink any alcoholic beverage   You may resume regular meals tomorrow.  Today it is better to start with liquids and gradually work up to solid foods.  You may eat anything you prefer, but it is better to  start with liquids, then soup and crackers, and gradually work up to solid foods.   Please notify your doctor immediately if you have any unusual bleeding, trouble breathing, redness and pain at the surgery site, drainage, fever, or pain not relieved by medication.    Additional Instructions:   Please contact your physician with any problems or Same Day Surgery at [phone], Monday through Friday 6 am to 4 pm, or [place] at [place] Main number at [phone]. 

## 2021-08-12 ENCOUNTER — Encounter
Admission: RE | Admit: 2021-08-12 | Discharge: 2021-08-12 | Disposition: A | Discharge status: Home / Self Care | Payer: Medicare Other | Source: Ambulatory Visit | Attending: Orthopedic Surgery | Admitting: Orthopedic Surgery

## 2021-08-12 ENCOUNTER — Other Ambulatory Visit: Payer: Self-pay

## 2021-08-12 DIAGNOSIS — Z79899 Other long term (current) drug therapy: Secondary | ICD-10-CM

## 2021-08-12 NOTE — Patient Instructions (Addendum)
Your procedure is scheduled on:08-14-21 Wednesday ?Report to the Registration Desk on the 1st floor of the [facility].Then proceed to the 2nd floor Surgery Desk in the [facility] ?To find out your arrival time, please call [phone] between 1PM - 3PM on:08-13-21 Tuesday ? ?REMEMBER: ?Instructions that are not followed completely may result in serious medical risk, up to and including death; or upon the discretion of your surgeon and anesthesiologist your surgery may need to be rescheduled. ? ?Do not eat food after midnight the night before surgery.  ?No gum chewing, lozengers or hard candies. ? ?You may however, drink CLEAR liquids up to 2 hours before you are scheduled to arrive for your surgery. Do not drink anything within 2 hours of your scheduled arrival time. ? ?Clear liquids include: ?- water  ?- apple juice without pulp ?- gatorade (not RED colors) ?- black coffee or tea (Do NOT add milk or creamers to the coffee or tea) ?Do NOT drink anything that is not on this list. ? ?In addition, your doctor has ordered for you to drink the provided  ?Ensure Pre-Surgery Clear Carbohydrate Drink  ?Drinking this carbohydrate drink up to two hours before surgery helps to reduce insulin resistance and improve patient outcomes. Please complete drinking 2 hours prior to scheduled arrival time. ? ?TAKE THESE MEDICATIONS THE MORNING OF SURGERY WITH A SIP OF WATER: ?-amLODipine (NORVASC)  ?-buPROPion (WELLBUTRIN XL)  ?-DULoxetine (CYMBALTA)  ?-You may take clonazePAM [facility]) if needed for anxiety ? ? ?One week prior to surgery: ?Stop Anti-inflammatories (NSAIDS) such as Advil, Aleve, Ibuprofen, Motrin, Naproxen, Naprosyn and Aspirin based products such as Excedrin, Goodys Powder, BC Powder.You may however, take Tylenol if needed for pain up until the day of surgery. You may continue your celecoxib (CELEBREX) up until the day prior to surgery. ? ?Stop ANY OVER THE COUNTER supplements/vitamins NOW (08-12-21) until after  surgery (Cholecalciferol, Multiple Vitamin, vitamin B-12 ) ? ?No Alcohol for 24 hours before or after surgery. ? ?No Smoking including e-cigarettes for 24 hours prior to surgery.  ?No chewable tobacco products for at least 6 hours prior to surgery.  ?No nicotine patches on the day of surgery. ? ?Do not use any "recreational" drugs for at least a week prior to your surgery.  ?Please be advised that the combination of cocaine and anesthesia may have negative outcomes, up to and including death. ?If you test positive for cocaine, your surgery will be cancelled. ? ?On the morning of surgery brush your teeth with toothpaste and water, you may rinse your mouth with mouthwash if you wish. ?Do not swallow any toothpaste or mouthwash. ? ?Use CHG Soap as directed on instruction sheet. ? ?Do not wear jewelry, make-up, hairpins, clips or nail polish. ? ?Do not wear lotions, powders, or perfumes.  ? ?Do not shave body from the neck down 48 hours prior to surgery just in case you cut yourself which could leave a site for infection.  ?Also, freshly shaved skin may become irritated if using the CHG soap. ? ?Contact lenses, hearing aids and dentures may not be worn into surgery. ? ?Do not bring valuables to the hospital. [facility] is not responsible for any missing/lost belongings or valuables. ? ?Notify your doctor if there is any change in your medical condition (cold, fever, infection). ? ?Wear comfortable clothing (specific to your surgery type) to the hospital. ? ?After surgery, you can help prevent lung complications by doing breathing exercises.  ?Take deep breaths and cough every  1-2 hours. Your doctor may order a device called an Incentive Spirometer to help you take deep breaths. ?When coughing or sneezing, hold a pillow firmly against your incision with both hands. This is called ?splinting.? Doing this helps protect your incision. It also decreases belly discomfort. ? ?If you are being admitted to the hospital  overnight, leave your suitcase in the car. ?After surgery it may be brought to your room. ? ?If you are being discharged the day of surgery, you will not be allowed to drive home. ?You will need a responsible adult (18 years or older) to drive you home and stay with you that night.  ? ?If you are taking public transportation, you will need to have a responsible adult (18 years or older) with you. ?Please confirm with your physician that it is acceptable to use public transportation.  ? ?Please call the [facility] Dept. at [phone] if you have any questions about these instructions. ? ?Surgery Visitation Policy: ? ?Patients undergoing a surgery or procedure may have one family member or support person with them as long as that person is not COVID-19 positive or experiencing its symptoms.  ?That person may remain in the waiting area during the procedure and may rotate out with other people. ? ?

## 2021-08-13 ENCOUNTER — Encounter
Admission: RE | Admit: 2021-08-13 | Discharge: 2021-08-13 | Disposition: A | Discharge status: Home / Self Care | Payer: Medicare Other | Source: Ambulatory Visit | Attending: Orthopedic Surgery | Admitting: Orthopedic Surgery

## 2021-08-13 DIAGNOSIS — Z01812 Encounter for preprocedural laboratory examination: Secondary | ICD-10-CM | POA: Insufficient documentation

## 2021-08-13 DIAGNOSIS — Z79899 Other long term (current) drug therapy: Secondary | ICD-10-CM | POA: Diagnosis not present

## 2021-08-13 DIAGNOSIS — M2391 Unspecified internal derangement of right knee: Secondary | ICD-10-CM | POA: Diagnosis not present

## 2021-08-13 LAB — BASIC METABOLIC PANEL
Anion gap: 10 (ref 5–15)
BUN: 13 mg/dL (ref 8–23)
CO2: 24 mmol/L (ref 22–32)
Calcium: 9 mg/dL (ref 8.9–10.3)
Chloride: 104 mmol/L (ref 98–111)
Creatinine, Ser: 0.93 mg/dL (ref 0.61–1.24)
GFR, Estimated: >60 mL/min (ref >60)
Glucose, Bld: 98 mg/dL (ref 70–99)
Potassium: 4 mmol/L (ref 3.5–5.1)
Sodium: 138 mmol/L (ref 135–145)

## 2021-08-13 LAB — SURGICAL PCR SCREEN
MRSA, PCR: NEGATIVE
Staphylococcus aureus: POSITIVE — AB

## 2021-08-14 ENCOUNTER — Ambulatory Visit: Payer: Medicare Other | Admitting: Urgent Care

## 2021-08-14 ENCOUNTER — Other Ambulatory Visit: Payer: Self-pay

## 2021-08-14 ENCOUNTER — Ambulatory Visit
Admission: RE | Admit: 2021-08-14 | Discharge: 2021-08-14 | Disposition: A | Discharge status: Home / Self Care | Payer: Medicare Other | Attending: Orthopedic Surgery | Admitting: Orthopedic Surgery

## 2021-08-14 ENCOUNTER — Ambulatory Visit: Payer: Medicare Other | Admitting: Certified Registered"

## 2021-08-14 ENCOUNTER — Encounter
Admission: RE | Disposition: A | Discharge status: Home / Self Care | Payer: Self-pay | Source: Home / Self Care | Attending: Orthopedic Surgery

## 2021-08-14 ENCOUNTER — Encounter: Payer: Self-pay | Admitting: Orthopedic Surgery

## 2021-08-14 DIAGNOSIS — Z79899 Other long term (current) drug therapy: Secondary | ICD-10-CM | POA: Insufficient documentation

## 2021-08-14 DIAGNOSIS — B182 Chronic viral hepatitis C: Secondary | ICD-10-CM | POA: Diagnosis not present

## 2021-08-14 DIAGNOSIS — M94261 Chondromalacia, right knee: Secondary | ICD-10-CM | POA: Insufficient documentation

## 2021-08-14 DIAGNOSIS — Z9889 Other specified postprocedural states: Secondary | ICD-10-CM

## 2021-08-14 DIAGNOSIS — M109 Gout, unspecified: Secondary | ICD-10-CM | POA: Diagnosis not present

## 2021-08-14 DIAGNOSIS — M2391 Unspecified internal derangement of right knee: Secondary | ICD-10-CM | POA: Insufficient documentation

## 2021-08-14 DIAGNOSIS — F32A Depression, unspecified: Secondary | ICD-10-CM | POA: Diagnosis not present

## 2021-08-14 DIAGNOSIS — X58XXXA Exposure to other specified factors, initial encounter: Secondary | ICD-10-CM | POA: Diagnosis not present

## 2021-08-14 DIAGNOSIS — Y9301 Activity, walking, marching and hiking: Secondary | ICD-10-CM | POA: Diagnosis not present

## 2021-08-14 DIAGNOSIS — S83241A Other tear of medial meniscus, current injury, right knee, initial encounter: Secondary | ICD-10-CM | POA: Insufficient documentation

## 2021-08-14 DIAGNOSIS — F419 Anxiety disorder, unspecified: Secondary | ICD-10-CM | POA: Insufficient documentation

## 2021-08-14 DIAGNOSIS — Z87891 Personal history of nicotine dependence: Secondary | ICD-10-CM | POA: Insufficient documentation

## 2021-08-14 DIAGNOSIS — I10 Essential (primary) hypertension: Secondary | ICD-10-CM | POA: Insufficient documentation

## 2021-08-14 DIAGNOSIS — K219 Gastro-esophageal reflux disease without esophagitis: Secondary | ICD-10-CM | POA: Insufficient documentation

## 2021-08-14 HISTORY — PX: KNEE ARTHROSCOPY: SHX127

## 2021-08-14 SURGERY — ARTHROSCOPY, KNEE
Anesthesia: General | Site: Knee | Laterality: Right

## 2021-08-14 MED ORDER — MORPHINE SULFATE (PF) 4 MG/ML IV SOLN
INTRAVENOUS | Status: AC
  Filled 2021-08-14: qty 1

## 2021-08-14 MED ORDER — LACTATED RINGERS IR SOLN
Status: DC | PRN
  Administered 2021-08-14 (×4): 3000 mL

## 2021-08-14 MED ORDER — ACETAMINOPHEN 10 MG/ML IV SOLN
INTRAVENOUS | Status: DC | PRN
  Administered 2021-08-14: 1000 mg via INTRAVENOUS

## 2021-08-14 MED ORDER — PROPOFOL 10 MG/ML IV BOLUS
INTRAVENOUS | Status: DC | PRN
Start: 2021-08-14 — End: 2021-08-14
  Administered 2021-08-14: 170 mg via INTRAVENOUS

## 2021-08-14 MED ORDER — FENTANYL CITRATE (PF) 100 MCG/2ML IJ SOLN
INTRAMUSCULAR | Status: AC
  Filled 2021-08-14: qty 2

## 2021-08-14 MED ORDER — FENTANYL CITRATE (PF) 100 MCG/2ML IJ SOLN: 25.0000 ug | INTRAMUSCULAR | Status: DC | PRN

## 2021-08-14 MED ORDER — ACETAMINOPHEN 10 MG/ML IV SOLN
INTRAVENOUS | Status: AC
  Filled 2021-08-14: qty 100

## 2021-08-14 MED ORDER — LACTATED RINGERS IV SOLN: INTRAVENOUS | Status: DC

## 2021-08-14 MED ORDER — METOCLOPRAMIDE HCL 10 MG PO TABS: 5.0000 mg | ORAL_TABLET | Freq: Three times a day (TID) | ORAL | Status: DC | PRN

## 2021-08-14 MED ORDER — LIDOCAINE HCL (CARDIAC) PF 100 MG/5ML IV SOSY
PREFILLED_SYRINGE | INTRAVENOUS | Status: DC | PRN
  Administered 2021-08-14: 80 mg via INTRAVENOUS

## 2021-08-14 MED ORDER — CELECOXIB 200 MG PO CAPS
ORAL_CAPSULE | ORAL | Status: AC
  Filled 2021-08-14: qty 2

## 2021-08-14 MED ORDER — ONDANSETRON HCL 4 MG PO TABS
4.0000 mg | ORAL_TABLET | Freq: Four times a day (QID) | ORAL | Status: DC | PRN
Start: 2021-08-14 — End: 2021-08-14

## 2021-08-14 MED ORDER — ONDANSETRON HCL 4 MG/2ML IJ SOLN: 4.0000 mg | Freq: Once | INTRAMUSCULAR | Status: DC | PRN

## 2021-08-14 MED ORDER — MORPHINE SULFATE 4 MG/ML IJ SOLN
INTRAMUSCULAR | Status: DC | PRN
  Administered 2021-08-14: 4 mg via SUBCUTANEOUS

## 2021-08-14 MED ORDER — METOCLOPRAMIDE HCL 5 MG/ML IJ SOLN: 5.0000 mg | Freq: Three times a day (TID) | INTRAMUSCULAR | Status: DC | PRN

## 2021-08-14 MED ORDER — GLYCOPYRROLATE 0.2 MG/ML IJ SOLN
INTRAMUSCULAR | Status: DC | PRN
Start: 2021-08-14 — End: 2021-08-14
  Administered 2021-08-14: .2 mg via INTRAVENOUS

## 2021-08-14 MED ORDER — CHLORHEXIDINE GLUCONATE 0.12 % MT SOLN
OROMUCOSAL | Status: AC
  Filled 2021-08-14: qty 15

## 2021-08-14 MED ORDER — SODIUM CHLORIDE 0.9 % IV SOLN: INTRAVENOUS | Status: DC

## 2021-08-14 MED ORDER — BUPIVACAINE-EPINEPHRINE (PF) 0.25% -1:200000 IJ SOLN
INTRAMUSCULAR | Status: AC
  Filled 2021-08-14: qty 30

## 2021-08-14 MED ORDER — OXYCODONE HCL 5 MG/5ML PO SOLN: 5.0000 mg | Freq: Once | ORAL | Status: DC | PRN

## 2021-08-14 MED ORDER — BUPIVACAINE-EPINEPHRINE 0.25% -1:200000 IJ SOLN
INTRAMUSCULAR | Status: DC | PRN
  Administered 2021-08-14: 30 mL

## 2021-08-14 MED ORDER — ORAL CARE MOUTH RINSE: 15.0000 mL | Freq: Once | OROMUCOSAL | Status: AC

## 2021-08-14 MED ORDER — CEFAZOLIN SODIUM-DEXTROSE 2-4 GM/100ML-% IV SOLN
2.0000 g | INTRAVENOUS | Status: AC
  Administered 2021-08-14: 2 g via INTRAVENOUS

## 2021-08-14 MED ORDER — OXYCODONE HCL 5 MG PO TABS: 5.0000 mg | ORAL_TABLET | Freq: Once | ORAL | Status: DC | PRN

## 2021-08-14 MED ORDER — FENTANYL CITRATE (PF) 100 MCG/2ML IJ SOLN
INTRAMUSCULAR | Status: DC | PRN
  Administered 2021-08-14 (×4): 25 ug via INTRAVENOUS

## 2021-08-14 MED ORDER — CEFAZOLIN SODIUM-DEXTROSE 2-4 GM/100ML-% IV SOLN
INTRAVENOUS | Status: AC
  Filled 2021-08-14: qty 100

## 2021-08-14 MED ORDER — CHLORHEXIDINE GLUCONATE 0.12 % MT SOLN
15.0000 mL | Freq: Once | OROMUCOSAL | Status: AC
  Administered 2021-08-14: 15 mL via OROMUCOSAL

## 2021-08-14 MED ORDER — ONDANSETRON HCL 4 MG/2ML IJ SOLN: 4.0000 mg | Freq: Four times a day (QID) | INTRAMUSCULAR | Status: DC | PRN

## 2021-08-14 MED ORDER — FAMOTIDINE 20 MG PO TABS
20.0000 mg | ORAL_TABLET | Freq: Once | ORAL | Status: AC
Start: 2021-08-14 — End: 2021-08-14
  Administered 2021-08-14: 20 mg via ORAL

## 2021-08-14 MED ORDER — CELECOXIB 200 MG PO CAPS
400.0000 mg | ORAL_CAPSULE | Freq: Once | ORAL | Status: AC
  Administered 2021-08-14: 400 mg via ORAL

## 2021-08-14 MED ORDER — MIDAZOLAM HCL 2 MG/2ML IJ SOLN
INTRAMUSCULAR | Status: AC
  Filled 2021-08-14: qty 2

## 2021-08-14 MED ORDER — HYDROCODONE-ACETAMINOPHEN 5-325 MG PO TABS: 1.0000 | ORAL_TABLET | ORAL | 0 refills | Status: DC | PRN

## 2021-08-14 MED ORDER — PHENYLEPHRINE 40 MCG/ML (10ML) SYRINGE FOR IV PUSH (FOR BLOOD PRESSURE SUPPORT)
PREFILLED_SYRINGE | INTRAVENOUS | Status: DC | PRN
  Administered 2021-08-14 (×2): 80 ug via INTRAVENOUS

## 2021-08-14 MED ORDER — MIDAZOLAM HCL 2 MG/2ML IJ SOLN
INTRAMUSCULAR | Status: DC | PRN
Start: 2021-08-14 — End: 2021-08-14
  Administered 2021-08-14: 2 mg via INTRAVENOUS

## 2021-08-14 MED ORDER — FAMOTIDINE 20 MG PO TABS
ORAL_TABLET | ORAL | Status: DC
Start: 2021-08-14 — End: 2021-08-14
  Filled 2021-08-14: qty 1

## 2021-08-14 SURGICAL SUPPLY — 31 items
ADAPTER IRRIG TUBE 2 SPIKE SOL (ADAPTER) ×3 IMPLANT
ADPR TBG 2 SPK PMP STRL ASCP (ADAPTER) ×1
BLADE SHAVER 4.5 DBL SERAT CV (CUTTER) IMPLANT
BNDG CMPR STD VLCR NS LF 5.8X6 (GAUZE/BANDAGES/DRESSINGS)
BNDG ELASTIC 6X5.8 VLCR NS LF (GAUZE/BANDAGES/DRESSINGS) ×1 IMPLANT
DRAPE ARTHRO LIMB 89X125 STRL (DRAPES) ×3 IMPLANT
DRSG DERMACEA 8X12 NADH (GAUZE/BANDAGES/DRESSINGS) ×2 IMPLANT
DURAPREP 26ML APPLICATOR (WOUND CARE) ×3 IMPLANT
GAUZE SPONGE 4X4 12PLY STRL (GAUZE/BANDAGES/DRESSINGS) ×2 IMPLANT
GLOVE SURG ENC TEXT LTX SZ7.5 (GLOVE) ×2 IMPLANT
GLOVE SURG UNDER LTX SZ8 (GLOVE) ×2 IMPLANT
GOWN STRL REUS W/ TWL LRG LVL3 (GOWN DISPOSABLE) ×2 IMPLANT
GOWN STRL REUS W/TWL LRG LVL3 (GOWN DISPOSABLE) ×4
IV LACTATED RINGER IRRG 3000ML (IV SOLUTION) ×8
IV LR IRRIG 3000ML ARTHROMATIC (IV SOLUTION) ×2 IMPLANT
KIT TURNOVER KIT A (KITS) ×2 IMPLANT
MANIFOLD NEPTUNE II (INSTRUMENTS) ×2 IMPLANT
PACK ARTHROSCOPY KNEE (MISCELLANEOUS) ×2 IMPLANT
PADDING CAST 6X4YD NS (MISCELLANEOUS)
PADDING CAST COTTON 6X4 NS (MISCELLANEOUS) ×1 IMPLANT
SHAVER BLADE TAPERED BLUNT 4 (BLADE) ×2 IMPLANT
SOL PREP PVP 2OZ (MISCELLANEOUS) ×2
SOLUTION PREP PVP 2OZ (MISCELLANEOUS) ×1 IMPLANT
SPONGE T-LAP 18X18 ~~LOC~~+RFID (SPONGE) ×1 IMPLANT
SUT ETHILON 3-0 FS-10 30 BLK (SUTURE) ×2
SUTURE EHLN 3-0 FS-10 30 BLK (SUTURE) ×1 IMPLANT
TUBING INFLOW SET DBFLO PUMP (TUBING) ×2 IMPLANT
TUBING OUTFLOW SET DBLFO PUMP (TUBING) ×2 IMPLANT
WAND HAND CNTRL MULTIVAC 50 (MISCELLANEOUS) ×3 IMPLANT
WATER STERILE IRR 500ML POUR (IV SOLUTION) ×2 IMPLANT
WRAP KNEE W/COLD PACKS 25.5X14 (SOFTGOODS) ×2 IMPLANT

## 2021-08-14 NOTE — Transfer of Care (Signed)
Immediate Anesthesia Transfer of Care Note ? ?Patient: [name] ? ?Procedure(s) Performed: ARTHROSCOPY KNEE, medial menisectomy and chondroplasty, loose bodies removal (Right: Knee) ? ?Patient Location: PACU ? ?Anesthesia Type:General ? ?Level of Consciousness: drowsy ? ?Airway & Oxygen Therapy: Patient Spontanous Breathing and Patient connected to face mask oxygen ? ?Post-op Assessment: Report given to RN and Post -op Vital signs reviewed and stable ? ?Post vital signs: Reviewed and stable ? ?Last Vitals:  ?Vitals Value Taken Time  ?BP 106/73 08/14/21 1651  ?Temp    ?Pulse 67 08/14/21 1656  ?Resp 13 08/14/21 1656  ?SpO2 93 % 08/14/21 1656  ?Vitals shown include unvalidated device data. ? ?Last Pain:  ?Vitals:  ? 08/14/21 1355  ?TempSrc: Temporal  ?PainSc: 0-No pain  ?   ? ?  ? ?Complications: No notable events documented. ?

## 2021-08-14 NOTE — Anesthesia Procedure Notes (Signed)
Procedure Name: LMA Insertion ?Date/Time: 08/14/2021 3:05 PM ?Performed by: Jerrye Noble, CRNA ?Pre-anesthesia Checklist: Patient identified, Emergency Drugs available, Patient being monitored and Suction available ?Patient Re-evaluated:Patient Re-evaluated prior to induction ?Oxygen Delivery Method: Circle system utilized ?Preoxygenation: Pre-oxygenation with 100% oxygen ?Induction Type: IV induction ?Ventilation: Mask ventilation without difficulty ?LMA: LMA inserted ?LMA Size: 4.5 ?Number of attempts: 1 ?Placement Confirmation: positive ETCO2 and breath sounds checked- equal and bilateral ?Tube secured with: Tape ?Dental Injury: Teeth and Oropharynx as per pre-operative assessment  ? ? ? ? ?

## 2021-08-14 NOTE — Anesthesia Preprocedure Evaluation (Signed)
Anesthesia Evaluation  ?Patient identified by MRN, date of birth, ID band ?Patient awake ? ? ? ?Reviewed: ?Allergy & Precautions, NPO status , Patient's Chart, lab work & pertinent test results ? ?History of Anesthesia Complications ?Negative for: history of anesthetic complications ? ?Airway ?Mallampati: II ? ?TM Distance: >3 FB ?Neck ROM: Full ? ? ? Dental ? ?(+) Implants, Dental Advidsory Given, Teeth Intact ?  ?Pulmonary ?neg pulmonary ROS, neg shortness of breath, neg sleep apnea, neg COPD, neg recent URI,  ?  ?breath sounds clear to auscultation- rhonchi ?(-) wheezing ? ? ? ? ? Cardiovascular ?hypertension, Pt. on medications ?(-) angina(-) CAD, (-) Past MI, (-) Cardiac Stents and (-) CABG (-) dysrhythmias  ?Rhythm:Regular Rate:Normal ?- Systolic murmurs and - Diastolic murmurs ? ?  ?Neuro/Psych ?neg Seizures PSYCHIATRIC DISORDERS Anxiety Depression negative neurological ROS ?   ? GI/Hepatic ?GERD  ,(+) Hepatitis - (hx of hep c s/p treatment)  ?Endo/Other  ?negative endocrine ROSneg diabetes ? Renal/GU ?Renal disease (hx of nephrolithiasis)  ? ?  ?Musculoskeletal ? ?(+) Arthritis ,  ? Abdominal ?(+) + obese,   ?Peds ? Hematology ?negative hematology ROS ?(+)   ?Anesthesia Other Findings ?Past Medical History: ?No date: Actinic keratosis ?No date: Anxiety ?No date: Arthritis ?No date: Back pain ?    Comment:  MULTIPLE BACK SURGERIES ?10/09/2016: Basal cell carcinoma ?    Comment:  L sup med scapula  ?07/11/2019: Basal cell carcinoma ?    Comment:  L lat forehead above lat brow  ?08/29/2020: Basal cell carcinoma ?    Comment:  R prox medial pretibial, EDC ?08/29/2020: Basal cell carcinoma ?    Comment:  R posterior ear, EDC ?12/27/2020: BCC (basal cell carcinoma of skin) ?    Comment:  R epigastric - ED&C ?12/07/2014: Benign essential HTN ?    Comment:  Last Assessment & Plan:  Relevant Hx: Course: Daily  ?             Update: Today's Plan: CONTROLLED ON MEDS ?03/31/2013: Benign  prostatic hyperplasia with urinary obstruction ?No date: Chronic back pain ?03/31/2013: Chronic inflammation of tunica albuginea ?No date: Cirrhosis ([facility]) ?11/06/2014: DDD (degenerative disc disease), cervical ?11/06/2014: DDD (degenerative disc disease), lumbar ?No date: Depression ?04/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 ?    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    Comment:  face, numerous lesions removed  ?No date: Erythrocytosis ?12/07/2014: Esophagitis, reflux ?No date: GERD (gastroesophageal reflux disease) ?    Comment:  had surgery ?12/07/2014: HCV (hepatitis C virus) ?No date: Hepatitis C ?    Comment:  DID HARVONI TREATMENT/ NO LONGER HEP C POSITIVE ?03/01/2013: Idiopathic scoliosis and kyphoscoliosis ?No date: Neuropathy of both feet ?No date: Painful legs and moving toes ?    Comment:  DUE TO BACK PROBLEMS ?No date: Prostatitis ?11/06/2014: Sacroiliac joint dysfunction ?12/07/2014: Spinal stenosis ?11/04/2011: Testicular dysfunction ?    Comment:  Overview:  Dr. Jacqlyn Larsen is his urologist.  ? ? Reproductive/Obstetrics ? ?  ? ? ? ? ? ? ? ? ? ? ? ? ? ?  ?  ? ? ? ? ? ? ? ? ?Anesthesia Physical ? ?Anesthesia Plan ? ?ASA: 3 ? ?Anesthesia Plan: General  ? ?Post-op Pain Management:   ? ?Induction: Intravenous ? ?PONV Risk Score and Plan: 1 and Ondansetron, Dexamethasone and Treatment may vary due to age or medical condition ? ?Airway Management Planned: LMA ? ?Additional Equipment:  ? ?Intra-op Plan:  ? ?  Post-operative Plan: Extubation in OR ? ?Informed Consent: I have reviewed the patients History and Physical, chart, labs and discussed the procedure including the risks, benefits and alternatives for the proposed anesthesia with the patient or authorized representative who has indicated his/her understanding and acceptance.  ? ? ? ?Dental advisory given ? ?Plan Discussed with: CRNA and Anesthesiologist ? ?Anesthesia Plan Comments:   ? ? ? ? ? ? ?Anesthesia Quick  Evaluation ? ?

## 2021-08-14 NOTE — Op Note (Signed)
OPERATIVE NOTE ? ?DATE OF SURGERY:  08/14/2021 ? ?PATIENT NAME:  [name]   ?DOB: [birth year]  ?MRN: [id number] ? ? ?PRE-OPERATIVE DIAGNOSIS:  Internal derangement of the right knee  ? 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 ?PROCEDURE:  Right knee arthroscopy, partial medial meniscectomy, chondroplasty, and removal of osteochondral loose bodies ? ?SURGEON:  Marciano Sequin., M.D.  ? ?ASSISTANT: none ? ?ANESTHESIA: general ? ?ESTIMATED BLOOD LOSS: Minimal ? ?FLUIDS REPLACED: 1000 mL of crystalloid ? ?TOURNIQUET TIME: 18 minutes ? ?INDICATIONS FOR SURGERY: ONIS MARKOFF is a 71 y.o. year old male who has been seen for complaints of right knee pain. MRI demonstrated findings consistent with meniscal pathology. After discussion of the risks and benefits of surgical intervention, the patient expressed understanding of the risks benefits and agree with plans for right knee arthroscopy.  ? ?PROCEDURE IN DETAIL: The patient was brought into the operating room and, after adequate general anesthesia was achieved, a tourniquet was applied to the right thigh and the leg was placed in the leg holder. All bony prominences were well padded. The patient's right knee was cleaned and prepped with alcohol and Duraprep and draped in the usual sterile fashion. A "timeout" was performed as per usual protocol. The anticipated portal sites were injected with 0.25% Marcaine with epinephrine. An anterolateral incision was made and a cannula was inserted. A moderate effusion was evacuated and the knee was distended with fluid using the pump. The scope was advanced down the medial gutter into the medial compartment. Under visualization with the scope, an anteromedial portal was created and a hooked probe was inserted. The medial meniscus was visualized and  probed.  There was a complex tear of the posterior horn of the medial meniscus.  The tear was debrided using meniscal punches and a shaver.  Final contouring was performed using the ArthroCare wand.  The articular cartilage was visualized.  There were grade 3 changes of chondromalacia involving the medial femoral condyle.  These areas were debrided and contoured using the ArthroCare wand. ? ?The scope was then advanced into the intercondylar notch.  Multiple osteochondral loose bodies were encountered and these were removed using pituitary rongeurs.  The largest of which measured approximately 10 mm in size.  The anterior cruciate ligament was visualized and probed and felt to be intact.  The tourniquet was inflated to 300 mmHg to allow for better visualization.  Stasis was achieved using the ArthroCare wand.  The scope was removed from the lateral portal and reinserted via the anteromedial portal to better visualize the lateral compartment. The lateral meniscus was visualized and probed.  The lateral meniscus was intact.  The articular cartilage of the lateral compartment was visualized and noted to be in reasonably good condition.  Finally, the scope was advanced so as to visualize the patellofemoral articulation. Good patellar tracking was appreciated.  There were grade 3 changes of chondromalacia involving the intercondylar sulcus.  This area was debrided and contoured using the ArthroCare wand. ? ?The knee was irrigated with copius amounts of fluid and suctioned dry. The anterolateral portal was re-approximated with #3-0 nylon. A combination of 0.25% Marcaine with epinephrine and 4 mg of Morphine were injected via the scope. The scope was removed and the anteromedial portal was re-approximated with #3-0 nylon. A sterile dressing was applied followed by application of an ice  wrap. ? ?The patient tolerated the procedure well and was transported to the PACU in stable condition. ? ?Drianna Chandran P. Holley Bouche., M.D. ? ?

## 2021-08-14 NOTE — H&P (Signed)
The patient has been re-examined, and the chart reviewed, and there have been no interval changes to the documented history and physical.    The risks, benefits, and alternatives have been discussed at length. The patient expressed understanding of the risks benefits and agreed with plans for surgical intervention.  Deshanae Lindo P. Maitlyn Penza, Jr. M.D.    

## 2021-08-14 NOTE — Anesthesia Postprocedure Evaluation (Signed)
Anesthesia Post Note ? ?Patient: [name] ? ?Procedure(s) Performed: ARTHROSCOPY KNEE, medial menisectomy and chondroplasty, loose bodies removal (Right: Knee) ? ?Patient location during evaluation: PACU ?Anesthesia Type: General ?Level of consciousness: awake and alert ?Pain management: pain level controlled ?Vital Signs Assessment: post-procedure vital signs reviewed and stable ?Respiratory status: spontaneous breathing, nonlabored ventilation, respiratory function stable and patient connected to nasal cannula oxygen ?Cardiovascular status: blood pressure returned to baseline and stable ?Postop Assessment: no apparent nausea or vomiting ?Anesthetic complications: no ? ? ?No notable events documented. ? ? ?Last Vitals:  ?Vitals:  ? 08/14/21 1730 08/14/21 1741  ?BP: 126/80 136/86  ?Pulse: 83 74  ?Resp: 16 17  ?Temp: (!) 36.4 ?C (!) 36.3 ?C  ?SpO2: 93% 96%  ?  ?Last Pain:  ?Vitals:  ? 08/14/21 1741  ?TempSrc: Temporal  ?PainSc: 0-No pain  ? ? ?  ?  ?  ?  ?  ?  ? ?Arita Miss ? ? ? ? ?

## 2021-08-15 ENCOUNTER — Encounter: Payer: Self-pay | Admitting: Orthopedic Surgery

## 2021-08-28 DIAGNOSIS — Z9889 Other specified postprocedural states: Secondary | ICD-10-CM | POA: Insufficient documentation

## 2021-09-23 ENCOUNTER — Other Ambulatory Visit: Payer: Self-pay

## 2021-09-23 ENCOUNTER — Encounter
Admission: RE | Disposition: A | Discharge status: Home / Self Care | Payer: Self-pay | Source: Home / Self Care | Attending: Gastroenterology

## 2021-09-23 ENCOUNTER — Ambulatory Visit: Payer: Medicare Other | Admitting: Anesthesiology

## 2021-09-23 ENCOUNTER — Encounter: Payer: Self-pay | Admitting: *Deleted

## 2021-09-23 ENCOUNTER — Ambulatory Visit
Admission: RE | Admit: 2021-09-23 | Discharge: 2021-09-23 | Disposition: A | Discharge status: Home / Self Care | Payer: Medicare Other | Attending: Gastroenterology | Admitting: Gastroenterology

## 2021-09-23 DIAGNOSIS — K319 Disease of stomach and duodenum, unspecified: Secondary | ICD-10-CM | POA: Diagnosis not present

## 2021-09-23 DIAGNOSIS — F419 Anxiety disorder, unspecified: Secondary | ICD-10-CM | POA: Diagnosis not present

## 2021-09-23 DIAGNOSIS — Z86010 Personal history of colonic polyps: Secondary | ICD-10-CM | POA: Insufficient documentation

## 2021-09-23 DIAGNOSIS — K640 First degree hemorrhoids: Secondary | ICD-10-CM | POA: Diagnosis not present

## 2021-09-23 DIAGNOSIS — K5730 Diverticulosis of large intestine without perforation or abscess without bleeding: Secondary | ICD-10-CM | POA: Diagnosis not present

## 2021-09-23 DIAGNOSIS — Z1211 Encounter for screening for malignant neoplasm of colon: Secondary | ICD-10-CM | POA: Insufficient documentation

## 2021-09-23 DIAGNOSIS — Z9889 Other specified postprocedural states: Secondary | ICD-10-CM | POA: Diagnosis not present

## 2021-09-23 DIAGNOSIS — Z8619 Personal history of other infectious and parasitic diseases: Secondary | ICD-10-CM | POA: Insufficient documentation

## 2021-09-23 DIAGNOSIS — R1310 Dysphagia, unspecified: Secondary | ICD-10-CM | POA: Diagnosis not present

## 2021-09-23 DIAGNOSIS — F32A Depression, unspecified: Secondary | ICD-10-CM | POA: Insufficient documentation

## 2021-09-23 DIAGNOSIS — I10 Essential (primary) hypertension: Secondary | ICD-10-CM | POA: Diagnosis not present

## 2021-09-23 DIAGNOSIS — K2970 Gastritis, unspecified, without bleeding: Secondary | ICD-10-CM | POA: Insufficient documentation

## 2021-09-23 DIAGNOSIS — E291 Testicular hypofunction: Secondary | ICD-10-CM | POA: Insufficient documentation

## 2021-09-23 DIAGNOSIS — M1990 Unspecified osteoarthritis, unspecified site: Secondary | ICD-10-CM | POA: Insufficient documentation

## 2021-09-23 DIAGNOSIS — K219 Gastro-esophageal reflux disease without esophagitis: Secondary | ICD-10-CM | POA: Insufficient documentation

## 2021-09-23 HISTORY — DX: Sciatica, right side: M54.31

## 2021-09-23 HISTORY — DX: Decreased white blood cell count, unspecified: D72.819

## 2021-09-23 HISTORY — PX: ESOPHAGOGASTRODUODENOSCOPY (EGD) WITH PROPOFOL: SHX5813

## 2021-09-23 HISTORY — PX: COLONOSCOPY WITH PROPOFOL: SHX5780

## 2021-09-23 HISTORY — DX: Sciatica, right side: M54.32

## 2021-09-23 SURGERY — COLONOSCOPY WITH PROPOFOL
Anesthesia: General

## 2021-09-23 MED ORDER — SODIUM CHLORIDE 0.9 % IV SOLN: INTRAVENOUS | Status: DC

## 2021-09-23 MED ORDER — PROPOFOL 10 MG/ML IV BOLUS
INTRAVENOUS | Status: DC | PRN
  Administered 2021-09-23: 50 mg via INTRAVENOUS
  Administered 2021-09-23: 100 mg via INTRAVENOUS

## 2021-09-23 MED ORDER — PROPOFOL 500 MG/50ML IV EMUL
INTRAVENOUS | Status: DC | PRN
  Administered 2021-09-23: 175 ug/kg/min via INTRAVENOUS

## 2021-09-23 MED ORDER — PROPOFOL 500 MG/50ML IV EMUL
INTRAVENOUS | Status: AC
  Filled 2021-09-23: qty 50

## 2021-09-23 NOTE — Anesthesia Postprocedure Evaluation (Signed)
Anesthesia Post Note ? ?Patient: [name] ? ?Procedure(s) Performed: COLONOSCOPY WITH PROPOFOL ?ESOPHAGOGASTRODUODENOSCOPY (EGD) WITH PROPOFOL ? ?Patient location during evaluation: Endoscopy ?Anesthesia Type: General ?Level of consciousness: awake and alert ?Pain management: pain level controlled ?Vital Signs Assessment: post-procedure vital signs reviewed and stable ?Respiratory status: spontaneous breathing, nonlabored ventilation, respiratory function stable and patient connected to nasal cannula oxygen ?Cardiovascular status: blood pressure returned to baseline and stable ?Postop Assessment: no apparent nausea or vomiting ?Anesthetic complications: no ? ? ?No notable events documented. ? ? ?Last Vitals:  ?Vitals:  ? 09/23/21 1112 09/23/21 1133  ?BP: 128/88 (!) 126/95  ?Pulse:    ?Resp:    ?Temp: (!) 35.7 ?C   ?SpO2:    ?  ?Last Pain:  ?Vitals:  ? 09/23/21 1146  ?TempSrc:   ?PainSc: 0-No pain  ? ? ?  ?  ?  ?  ?  ?  ? ?Precious Haws Vallen Calabrese ? ? ? ? ?

## 2021-09-23 NOTE — Interval H&P Note (Signed)
History and Physical Interval Note: ? ?09/23/2021 ?10:26 AM ? ?[name]  has presented today for surgery, with the diagnosis of esophageal dysphasia, hx of adenomatous polyp of colon.  The various methods of treatment have been discussed with the patient and family. After consideration of risks, benefits and other options for treatment, the patient has consented to  Procedure(s): ?COLONOSCOPY WITH PROPOFOL (N/A) ?ESOPHAGOGASTRODUODENOSCOPY (EGD) WITH PROPOFOL (N/A) as a surgical intervention.  The patient's history has been reviewed, patient examined, no change in status, stable for surgery.  I have reviewed the patient's chart and labs.  Questions were answered to the patient's satisfaction.   ? ? ?Hilton Cork Indira Sorenson ? ?Ok to proceed with EGD/Colonoscopy ?

## 2021-09-23 NOTE — H&P (Signed)
Outpatient short stay form Pre-procedure ?09/23/2021  ?Lesly Rubenstein, MD ? ?Primary Physician: Latanya Maudlin, NP ? ?Reason for visit:  Dysphagia/Surveillance colonoscopy ? ?History of present illness:   ? ?71 y/o gentleman with history of hypertension, history of hep c s/p SVR, and arthritis s/p several joint surgeries here for EGD for dysphagia and colonoscopy for history of adenomatous polyp in 2016. History of nissen fundoplication. No blood thinners. No family history of GI malignancies. ? ? ? ?Current Facility-Administered Medications:  ?  0.9 %  sodium chloride infusion, , Intravenous, Continuous, Yoland Scherr, Hilton Cork, MD ? ?Medications Prior to Admission  ?Medication Sig Dispense Refill Last Dose  ? amLODipine (NORVASC) 10 MG tablet Take 10 mg by mouth every morning.   09/23/2021  ? buPROPion (WELLBUTRIN XL) 300 MG 24 hr tablet Take 300 mg by mouth every morning.   09/22/2021  ? clonazePAM (KLONOPIN) 0.5 MG tablet Take 0.5-1 mg by mouth 2 (two) times daily as needed for anxiety.   Past Week  ? DULoxetine (CYMBALTA) 60 MG capsule 60 mg every morning.   09/22/2021  ? furosemide (LASIX) 40 MG tablet Take 40 mg by mouth daily as needed for fluid.    09/22/2021  ? spironolactone (ALDACTONE) 25 MG tablet Take 25 mg by mouth daily. AM   09/22/2021  ? tamsulosin (FLOMAX) 0.4 MG CAPS capsule Take 0.4 mg by mouth.   09/22/2021  ? traZODone (DESYREL) 50 MG tablet Take 50 mg by mouth at bedtime as needed for sleep.   Past Week  ? acetaminophen (TYLENOL) 650 MG CR tablet Take 650 mg by mouth every 8 (eight) hours as needed for pain. (Patient not taking: Reported on 08/12/2021)     ? Brimonidine Tartrate (LUMIFY) 0.025 % SOLN Place 1 drop into both eyes 2 (two) times daily as needed (redness).     ? celecoxib (CELEBREX) 200 MG capsule Take 200 mg by mouth 2 (two) times daily.     ? Cholecalciferol 125 MCG (5000 UT) TABS Take 5,000 Units by mouth daily.     ? cyclobenzaprine (FLEXERIL) 10 MG tablet Take 10 mg by mouth 3  (three) times daily as needed for muscle spasms.     ? fluorouracil (EFUDEX) 5 % cream Apply topically 2 (two) times daily. For 7 days to top of both hands (Patient not taking: Reported on 08/12/2021) 15 g 1   ? fluticasone (FLONASE) 50 MCG/ACT nasal spray Place 2 sprays into both nostrils every morning.     ? HYDROcodone-acetaminophen (NORCO) 5-325 MG tablet Take 1-2 tablets by mouth every 4 (four) hours as needed for moderate pain. 10 tablet 0   ? Multiple Vitamin (MULTIVITAMIN WITH MINERALS) TABS tablet Take 1 tablet by mouth daily.     ? NEEDLE, DISP, 23 G 23G X 1" MISC Use 1 Units every 10 (ten) days.     ? sodium fluoride (FLUORISHIELD) 1.1 % GEL dental gel Place 1 application onto teeth daily.     ? testosterone cypionate (DEPOTESTOSTERONE CYPIONATE) 200 MG/ML injection ADMINISTER 0.5 ML IN THE MUSCLE 1 TIME A WEEK 10 mL 0   ? vitamin B-12 (CYANOCOBALAMIN) 1000 MCG tablet Take 2,000 mcg by mouth daily.     ? ? ? ?No Known Allergies ? ? ?Past Medical History:  ?Diagnosis Date  ? Actinic keratosis   ? Anxiety   ? Arthritis   ? Back pain   ? MULTIPLE BACK SURGERIES  ? Basal cell carcinoma 10/09/2016  ? L sup med  scapula   ? Basal cell carcinoma 07/11/2019  ? L lat forehead above lat brow   ? Basal cell carcinoma 08/29/2020  ? R prox medial pretibial, EDC  ? Basal cell carcinoma 08/29/2020  ? R posterior ear, EDC  ? BCC (basal cell carcinoma of skin) 12/27/2020  ? R epigastric - ED&C  ? Benign essential HTN 12/07/2014  ? Last Assessment & Plan:  Relevant Hx: Course: Daily Update: Today's Plan: CONTROLLED ON MEDS  ? 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/31/2013  ? Bilateral sciatica   ? Chronic back pain   ? 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  ? Cirrhosis ([facility])   ? DDD (degenerative disc disease), cervical 11/06/2014  ? DDD (degenerative disc disease), lumbar 11/06/2014  ? Depression   ? 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  ? Overview:  severe in feet   ? Epidermoid  carcinoma 12/07/2014  ? face, numerous lesions removed   ? Erythrocytosis   ? Esophagitis, reflux 12/07/2014  ? GERD (gastroesophageal reflux disease)   ? had surgery  ? HCV (hepatitis C virus) 12/07/2014  ? Hepatitis C   ? DID HARVONI TREATMENT/ NO LONGER HEP C POSITIVE  ? Idiopathic scoliosis and kyphoscoliosis 03/01/2013  ? Leukopenia   ? Neuropathy of both feet   ? Painful legs and moving toes   ? DUE TO BACK PROBLEMS  ? Prostatitis   ? Sacroiliac joint dysfunction 11/06/2014  ? Spinal stenosis 12/07/2014  ? Testicular dysfunction 11/04/2011  ? Overview:  Dr. Jacqlyn Larsen is his urologist.   ? ? ?Review of systems:  Otherwise negative.  ? ? ?Physical Exam ? ?Gen: Alert, oriented. Appears stated age.  ?HEENT: PERRLA. ?Lungs: No respiratory distress ?CV: RRR ?Abd: soft, benign, no masses ?Ext: No edema ? ? ? ?Planned procedures: Proceed with EGD/colonoscopy. The patient understands the nature of the planned procedure, indications, risks, alternatives and potential complications including but not limited to bleeding, infection, perforation, damage to internal organs and possible oversedation/side effects from anesthesia. The patient agrees and gives consent to proceed.  ?Please refer to procedure notes for findings, recommendations and patient disposition/instructions.  ? ? ? ?Lesly Rubenstein, MD ?[facility] ? ? ? ?  ? ?

## 2021-09-23 NOTE — Op Note (Signed)
[facility] ?Gastroenterology ?Patient Name: [name] ?Procedure Date: 09/23/2021 10:18 AM ?MRN: [id number] ?Account #: [id number] ?Date of Birth: Oct 29, 1950 ?Admit Type: Outpatient ?Age: 71 ?Room: [facility] ENDO ROOM 3 ?Gender: Male ?Note Status: Finalized ?Instrument Name: Colonoscope [id number] ?Procedure:             Colonoscopy ?Indications:           Surveillance: Personal history of adenomatous polyps  ?                       on last colonoscopy > 5 years ago ?Providers:             Andrey Farmer MD, MD ?Referring MD:          Forest Gleason Md, MD (Referring MD) ?Medicines:             Monitored Anesthesia Care ?Complications:         No immediate complications. ?Procedure:             Pre-Anesthesia Assessment: ?                       - Prior to the procedure, a History and Physical was  ?                       performed, and patient medications and allergies were  ?                       reviewed. The patient is competent. The risks and  ?                       benefits of the procedure and the sedation options and  ?                       risks were discussed with the patient. All questions  ?                       were answered and informed consent was obtained.  ?                       Patient identification and proposed procedure were  ?                       verified by the physician, the nurse, the  ?                       anesthesiologist, the anesthetist and the technician  ?                       in the endoscopy suite. Mental Status Examination:  ?                       alert and oriented. Airway Examination: normal  ?                       oropharyngeal airway and neck mobility. Respiratory  ?                       Examination: clear to auscultation. CV Examination:  ?  normal. Prophylactic Antibiotics: The patient does not  ?                       require prophylactic antibiotics. Prior  ?                       Anticoagulants: The patient has taken no previous   ?                       anticoagulant or antiplatelet agents. ASA Grade  ?                       Assessment: III - A patient with severe systemic  ?                       disease. After reviewing the risks and benefits, the  ?                       patient was deemed in satisfactory condition to  ?                       undergo the procedure. The anesthesia plan was to use  ?                       monitored anesthesia care (MAC). Immediately prior to  ?                       administration of medications, the patient was  ?                       re-assessed for adequacy to receive sedatives. The  ?                       heart rate, respiratory rate, oxygen saturations,  ?                       blood pressure, adequacy of pulmonary ventilation, and  ?                       response to care were monitored throughout the  ?                       procedure. The physical status of the patient was  ?                       re-assessed after the procedure. ?                       After obtaining informed consent, the colonoscope was  ?                       passed under direct vision. Throughout the procedure,  ?                       the patient's blood pressure, pulse, and oxygen  ?                       saturations were monitored continuously. The  ?  Colonoscope was introduced through the anus and  ?                       advanced to the the cecum, identified by appendiceal  ?                       orifice and ileocecal valve. The colonoscopy was  ?                       somewhat difficult due to significant looping.  ?                       Successful completion of the procedure was aided by  ?                       applying abdominal pressure. The patient tolerated the  ?                       procedure well. The quality of the bowel preparation  ?                       was adequate to identify polyps. ?Findings: ?     The perianal and digital rectal examinations were normal. ?     Multiple small  and large-mouthed diverticula were found in the sigmoid  ?     colon, descending colon, transverse colon and ascending colon. ?     Internal hemorrhoids were found during retroflexion. The hemorrhoids  ?     were Grade I (internal hemorrhoids that do not prolapse). ?     The exam was otherwise without abnormality on direct and retroflexion  ?     views. ?Impression:            - Diverticulosis in the sigmoid colon, in the  ?                       descending colon, in the transverse colon and in the  ?                       ascending colon. ?                       - Internal hemorrhoids. ?                       - The examination was otherwise normal on direct and  ?                       retroflexion views. ?                       - No specimens collected. ?Recommendation:        - Discharge patient to home. ?                       - Resume previous diet. ?                       - Continue present medications. ?                       - Repeat  colonoscopy is not recommended due to current  ?                       age (88 years or older) for surveillance. ?                       - Return to referring physician as previously  ?                       scheduled. ?Procedure Code(s):     --- Professional --- ?                       G0105, Colorectal cancer screening; colonoscopy on  ?                       individual at high risk ?Diagnosis Code(s):     --- Professional --- ?                       Z86.010, Personal history of colonic polyps ?                       K64.0, First degree hemorrhoids ?                       K57.30, Diverticulosis of large intestine without  ?                       perforation or abscess without bleeding ?CPT copyright 2019 [employer] Association. All rights reserved. ?The codes documented in this report are preliminary and upon coder review may  ?be revised to meet current compliance requirements. ?Andrey Farmer MD, MD ?09/23/2021 11:13:14 AM ?Number of Addenda: 0 ?Note Initiated On:  09/23/2021 10:18 AM ?Scope Withdrawal Time: 0 hours 9 minutes 11 seconds  ?Total Procedure Duration: 0 hours 15 minutes 33 seconds  ?Estimated Blood Loss:  Estimated blood loss: none. ?     [facility] ?

## 2021-09-23 NOTE — Transfer of Care (Signed)
Immediate Anesthesia Transfer of Care Note ? ?Patient: [name] ? ?Procedure(s) Performed: COLONOSCOPY WITH PROPOFOL ?ESOPHAGOGASTRODUODENOSCOPY (EGD) WITH PROPOFOL ? ?Patient Location: PACU ? ?Anesthesia Type:General ? ?Level of Consciousness: awake and alert  ? ?Airway & Oxygen Therapy: Patient Spontanous Breathing and Patient connected to nasal cannula oxygen ? ?Post-op Assessment: Report given to RN and Post -op Vital signs reviewed and stable ? ?Post vital signs: Reviewed and stable ? ?Last Vitals:  ?Vitals Value Taken Time  ?BP 117/85 09/23/21 1105  ?Temp    ?Pulse 72 09/23/21 1106  ?Resp 17 09/23/21 1106  ?SpO2 97 % 09/23/21 1106  ?Vitals shown include unvalidated device data. ? ?Last Pain:  ?Vitals:  ? 09/23/21 1105  ?TempSrc:   ?PainSc: 0-No pain  ?   ? ?  ? ?Complications: No notable events documented. ?

## 2021-09-23 NOTE — Op Note (Signed)
[facility] ?Gastroenterology ?Patient Name: [name] ?Procedure Date: 09/23/2021 10:18 AM ?MRN: [id number] ?Account #: [id number] ?Date of Birth: 1950-10-23 ?Admit Type: Outpatient ?Age: 71 ?Room: [facility] ENDO ROOM 3 ?Gender: Male ?Note Status: Finalized ?Instrument Name: Upper Endoscope [id number] ?Procedure:             Upper GI endoscopy ?Indications:           Dysphagia ?Providers:             Andrey Farmer MD, MD ?Referring MD:          Forest Gleason Md, MD (Referring MD) ?Medicines:             Monitored Anesthesia Care ?Complications:         No immediate complications. Estimated blood loss:  ?                       Minimal. ?Procedure:             Pre-Anesthesia Assessment: ?                       - Prior to the procedure, a History and Physical was  ?                       performed, and patient medications and allergies were  ?                       reviewed. The patient is competent. The risks and  ?                       benefits of the procedure and the sedation options and  ?                       risks were discussed with the patient. All questions  ?                       were answered and informed consent was obtained.  ?                       Patient identification and proposed procedure were  ?                       verified by the physician, the nurse, the  ?                       anesthesiologist, the anesthetist and the technician  ?                       in the endoscopy suite. Mental Status Examination:  ?                       alert and oriented. Airway Examination: normal  ?                       oropharyngeal airway and neck mobility. Respiratory  ?                       Examination: clear to auscultation. CV Examination:  ?  normal. Prophylactic Antibiotics: The patient does not  ?                       require prophylactic antibiotics. Prior  ?                       Anticoagulants: The patient has taken no previous  ?                        anticoagulant or antiplatelet agents. ASA Grade  ?                       Assessment: III - A patient with severe systemic  ?                       disease. After reviewing the risks and benefits, the  ?                       patient was deemed in satisfactory condition to  ?                       undergo the procedure. The anesthesia plan was to use  ?                       monitored anesthesia care (MAC). Immediately prior to  ?                       administration of medications, the patient was  ?                       re-assessed for adequacy to receive sedatives. The  ?                       heart rate, respiratory rate, oxygen saturations,  ?                       blood pressure, adequacy of pulmonary ventilation, and  ?                       response to care were monitored throughout the  ?                       procedure. The physical status of the patient was  ?                       re-assessed after the procedure. ?                       After obtaining informed consent, the endoscope was  ?                       passed under direct vision. Throughout the procedure,  ?                       the patient's blood pressure, pulse, and oxygen  ?                       saturations were monitored continuously. The Endoscope  ?  was introduced through the mouth, and advanced to the  ?                       second part of duodenum. The upper GI endoscopy was  ?                       accomplished without difficulty. The patient tolerated  ?                       the procedure well. ?Findings: ?     The examined esophagus was normal. ?     A prior Nissen fundoplication was found in the lower third of the  ?     esophagus. A TTS dilator was passed through the scope. Dilation with an  ?     18-19-20 mm balloon dilator was performed to 18 mm. The dilation site  ?     was examined and showed no change. Estimated blood loss: none. ?     Evidence of a prior Nissen fundoplication was found in the  gastric  ?     fundus. This was characterized by an intact appearance. ?     Diffuse moderate inflammation characterized by erythema was found in the  ?     entire examined stomach. Biopsies were taken with a cold forceps for  ?     histology. Estimated blood loss was minimal. ?     The examined duodenum was normal. ?Impression:            - Normal esophagus. ?                       - A Nissen fundoplication was found. Dilated. ?                       - A Nissen fundoplication was found, characterized by  ?                       an intact appearance. ?                       - Gastritis. Biopsied. ?                       - Normal examined duodenum. ?Recommendation:        - Discharge patient to home. ?                       - Resume previous diet. ?                       - Continue present medications. ?                       - Await pathology results. ?                       - Return to referring physician as previously  ?                       scheduled. ?Procedure Code(s):     --- Professional --- ?  [phone], Esophagogastroduodenoscopy, flexible,  ?                       transoral; with transendoscopic balloon dilation of  ?                       esophagus (less than 30 mm diameter) ?Diagnosis Code(s):     --- Professional --- ?                       B34.037, Other specified postprocedural states ?                       K29.70, Gastritis, unspecified, without bleeding ?                       R13.10, Dysphagia, unspecified ?CPT copyright 2019 [employer] Association. All rights reserved. ?The codes documented in this report are preliminary and upon coder review may  ?be revised to meet current compliance requirements. ?Andrey Farmer MD, MD ?09/23/2021 11:08:38 AM ?Number of Addenda: 0 ?Note Initiated On: 09/23/2021 10:18 AM ?Estimated Blood Loss:  Estimated blood loss was minimal. ?     [facility] ?

## 2021-09-23 NOTE — Anesthesia Preprocedure Evaluation (Signed)
Anesthesia Evaluation  ?Patient identified by MRN, date of birth, ID band ?Patient awake ? ? ? ?Reviewed: ?Allergy & Precautions, NPO status , Patient's Chart, lab work & pertinent test results ? ?History of Anesthesia Complications ?Negative for: history of anesthetic complications ? ?Airway ?Mallampati: III ? ?TM Distance: >3 FB ?Neck ROM: full ? ? ? Dental ? ?(+) Chipped ?  ?Pulmonary ?neg shortness of breath, sleep apnea ,  ?  ?Pulmonary exam normal ? ? ? ? ? ? ? Cardiovascular ?hypertension, (-) anginaNormal cardiovascular exam ? ? ?  ?Neuro/Psych ?PSYCHIATRIC DISORDERS  Neuromuscular disease negative psych ROS  ? GI/Hepatic ?GERD  Controlled,(+) Hepatitis -  ?Endo/Other  ?negative endocrine ROS ? Renal/GU ?Renal disease  ?negative genitourinary ?  ?Musculoskeletal ? ? Abdominal ?  ?Peds ? Hematology ?negative hematology ROS ?(+)   ?Anesthesia Other Findings ?Patient reports that they do that think that any food or pills are stuck in their throat at this time. ? ?Past Medical History: ?No date: Actinic keratosis ?No date: Anxiety ?No date: Arthritis ?No date: Back pain ?    Comment:  MULTIPLE BACK SURGERIES ?10/09/2016: Basal cell carcinoma ?    Comment:  L sup med scapula  ?07/11/2019: Basal cell carcinoma ?    Comment:  L lat forehead above lat brow  ?08/29/2020: Basal cell carcinoma ?    Comment:  R prox medial pretibial, EDC ?08/29/2020: Basal cell carcinoma ?    Comment:  R posterior ear, EDC ?12/27/2020: BCC (basal cell carcinoma of skin) ?    Comment:  R epigastric - ED&C ?12/07/2014: Benign essential HTN ?    Comment:  Last Assessment & Plan:  Relevant Hx: Course: Daily  ?             Update: Today's Plan: CONTROLLED ON MEDS ?03/31/2013: Benign prostatic hyperplasia with urinary obstruction ?No date: Bilateral sciatica ?No date: Chronic back pain ?03/31/2013: Chronic inflammation of tunica albuginea ?No date: Cirrhosis ([place]) ?11/06/2014: DDD (degenerative disc disease),  cervical ?11/06/2014: DDD (degenerative disc disease), lumbar ?No date: Depression ?04/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 ?    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    Comment:  face, numerous lesions removed  ?No date: Erythrocytosis ?12/07/2014: Esophagitis, reflux ?No date: GERD (gastroesophageal reflux disease) ?    Comment:  had surgery ?12/07/2014: HCV (hepatitis C virus) ?No date: Hepatitis C ?    Comment:  DID HARVONI TREATMENT/ NO LONGER HEP C POSITIVE ?03/01/2013: Idiopathic scoliosis and kyphoscoliosis ?No date: Leukopenia ?No date: Neuropathy of both feet ?No date: Painful legs and moving toes ?    Comment:  DUE TO BACK PROBLEMS ?No date: Prostatitis ?11/06/2014: Sacroiliac joint dysfunction ?12/07/2014: Spinal stenosis ?11/04/2011: Testicular dysfunction ?    Comment:  Overview:  Dr. Jacqlyn Larsen is his urologist.  ? ?Past Surgical History: ?04/14/2013: BACK SURGERY ?    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    Comment:  3/17 ?06/10/1987: CATARACT EXTRACTION; Left ?No date: CERVICAL SPINE SURGERY ?    Comment:   X2 FUSION C4-7 ?07/10/2013: CHOLECYSTECTOMY ?12/18/2014: COLONOSCOPY WITH PROPOFOL; N/A ?    Comment:  Procedure: COLONOSCOPY WITH PROPOFOL;  Surgeon: Evangeline Gula  ?             Allen Norris, MD;  Location: [place];  Service:  ?             Endoscopy;  Laterality: N/A; ?04/02/2021: CYSTOSCOPY WITH INSERTION OF UROLIFT; N/A ?  Comment:  Procedure: CYSTOSCOPY WITH INSERTION OF UROLIFT;   ?             Surgeon: Abbie Sons, MD;  Location: [facility];   ?             Service: Urology;  Laterality: N/A; ?02/08/2012: ELBOW ARTHROSCOPY; Right ?No date: ELBOW SURGERY; Left ?12/18/2014: ESOPHAGOGASTRODUODENOSCOPY (EGD) WITH PROPOFOL; N/A ?    Comment:  Procedure: ESOPHAGOGASTRODUODENOSCOPY (EGD) WITH  ?             PROPOFOL with dialtion;  Surgeon: Lucilla Lame, MD;   ?             Location: [place];  Service: Endoscopy;   ?              Laterality: N/A; ?06/09/1968: FACIAL RECONSTRUCTION SURGERY ?    Comment:  BOTTLE CUT NOSE AND EYE, MULTIPLE SURGERIES ?06/09/2013: FINGER SURGERY; Left ?    Comment:  THUMB ?06/09/1964: FINGER SURGERY; Right ?    Comment:  MIDDLE ?06/09/1982: FOOT SURGERY; Right ?42/87/6811: FUNDIPLICATION ?5726/2035: HERNIA REPAIR; Left ?    Comment:  RIGHT SIDE IN 1957 ?2021: JOINT REPLACEMENT; Left ?    Comment:  knee ?02/10/2020: KNEE ARTHROPLASTY; Left ?    Comment:  Procedure: COMPUTER ASSISTED TOTAL KNEE ARTHROPLASTY -  ?             RNFA;  Surgeon: Dereck Leep, MD;  Location: [facility]; ?             Service: Orthopedics;  Laterality: Left; ?08/14/2021: KNEE ARTHROSCOPY; Right ?    Comment:  Procedure: ARTHROSCOPY KNEE, medial menisectomy and  ?             chondroplasty, loose bodies removal;  Surgeon: Hooten,  ?             Laurice Record, MD;  Location: [facility];  Service: Orthopedics;  ?             Laterality: Right; ?02/16/2015: MASS EXCISION; Left ?    Comment:  Procedure: LEFT BURSA ELBOW EXCISION;  Surgeon: Joneen Boers  ?             Jefm Bryant, MD;  Location: [place];  Service:  ?             Orthopedics;  Laterality: Left; ?59/74/1638: NISSEN FUNDOPLICATION ?45/36/4680: NOSE SURGERY ?    Comment:  SEPTUM RECONSTRUCTION ?12/18/2014: POLYPECTOMY ?    Comment:  Procedure: POLYPECTOMY;  Surgeon: Lucilla Lame, MD;   ?             Location: [place];  Service: Endoscopy;; ?06/09/1997: SEPTOPLASTY ?06/09/1981: SHOULDER SURGERY; Right ?06/09/1977: SHOULDER SURGERY; Left ?    Comment:  WITH ELBOW (TENDON RELEASE) ?06/09/2002: SKIN SURGERY ?    Comment:  SKIN CANCER NOSE ? ?BMI   ? Body Mass Index: 33.45 kg/m?  ?  ? ? Reproductive/Obstetrics ?negative OB ROS ? ?  ? ? ? ? ? ? ? ? ? ? ? ? ? ?  ?  ? ? ? ? ? ? ? ? ?Anesthesia Physical ?Anesthesia Plan ? ?ASA: 3 ? ?Anesthesia Plan: General  ? ?Post-op Pain Management:   ? ?Induction: Intravenous ? ?PONV Risk Score and Plan: Propofol infusion and TIVA ? ?Airway  Management Planned: Natural Airway and Nasal Cannula ? ?Additional Equipment:  ? ?Intra-op Plan:  ? ?Post-operative Plan:  ? ?Informed Consent: I have reviewed the patients History and Physical, chart,  labs and discussed the procedure including the risks, benefits and alternatives for the proposed anesthesia with the patient or authorized representative who has indicated his/her understanding and acceptance.  ? ? ? ?Dental Advisory Given ? ?Plan Discussed with: Anesthesiologist, CRNA and Surgeon ? ?Anesthesia Plan Comments: (Patient consented for risks of anesthesia including but not limited to:  ?- adverse reactions to medications ?- risk of airway placement if required ?- damage to eyes, teeth, lips or other oral mucosa ?- nerve damage due to positioning  ?- sore throat or hoarseness ?- Damage to heart, brain, nerves, lungs, other parts of body or loss of life ? ?Patient voiced understanding.)  ? ? ? ? ? ? ?Anesthesia Quick Evaluation ? ?

## 2021-09-24 ENCOUNTER — Encounter: Payer: Self-pay | Admitting: Gastroenterology

## 2021-09-24 LAB — SURGICAL PATHOLOGY

## 2021-10-10 ENCOUNTER — Other Ambulatory Visit: Payer: Self-pay | Admitting: Gerontology

## 2021-10-10 DIAGNOSIS — R413 Other amnesia: Secondary | ICD-10-CM

## 2021-11-08 ENCOUNTER — Inpatient Hospital Stay: Payer: Medicare Other

## 2021-11-08 ENCOUNTER — Inpatient Hospital Stay: Payer: Medicare Other | Attending: Internal Medicine

## 2021-11-08 VITALS — BP 153/102 | HR 80 | Temp 98.9°F

## 2021-11-08 DIAGNOSIS — Z79899 Other long term (current) drug therapy: Secondary | ICD-10-CM | POA: Diagnosis not present

## 2021-11-08 DIAGNOSIS — G8929 Other chronic pain: Secondary | ICD-10-CM | POA: Diagnosis not present

## 2021-11-08 DIAGNOSIS — D751 Secondary polycythemia: Secondary | ICD-10-CM | POA: Insufficient documentation

## 2021-11-08 DIAGNOSIS — M549 Dorsalgia, unspecified: Secondary | ICD-10-CM | POA: Diagnosis not present

## 2021-11-08 LAB — HEMOGLOBIN: Hemoglobin: 18.6 g/dL — ABNORMAL HIGH (ref 13.0–17.0)

## 2021-11-08 LAB — HEMATOCRIT: HCT: 52.8 % — ABNORMAL HIGH (ref 39.0–52.0)

## 2021-11-08 NOTE — Patient Instructions (Signed)

## 2021-11-08 NOTE — Progress Notes (Signed)
300 ml of blood removed per parameters for Therapeutic phlebotomy. Tolerated procedure well. Accepted a beverage. Feeling well at time of discharge after observation period.

## 2021-12-26 ENCOUNTER — Ambulatory Visit (INDEPENDENT_AMBULATORY_CARE_PROVIDER_SITE_OTHER): Payer: Medicare Other | Admitting: Dermatology

## 2021-12-26 DIAGNOSIS — Z1283 Encounter for screening for malignant neoplasm of skin: Secondary | ICD-10-CM

## 2021-12-26 DIAGNOSIS — L578 Other skin changes due to chronic exposure to nonionizing radiation: Secondary | ICD-10-CM

## 2021-12-26 DIAGNOSIS — L820 Inflamed seborrheic keratosis: Secondary | ICD-10-CM | POA: Diagnosis not present

## 2021-12-26 DIAGNOSIS — Z85828 Personal history of other malignant neoplasm of skin: Secondary | ICD-10-CM | POA: Diagnosis not present

## 2021-12-26 DIAGNOSIS — L570 Actinic keratosis: Secondary | ICD-10-CM | POA: Diagnosis not present

## 2021-12-26 DIAGNOSIS — D229 Melanocytic nevi, unspecified: Secondary | ICD-10-CM | POA: Diagnosis not present

## 2021-12-26 DIAGNOSIS — L821 Other seborrheic keratosis: Secondary | ICD-10-CM

## 2021-12-26 DIAGNOSIS — L814 Other melanin hyperpigmentation: Secondary | ICD-10-CM

## 2021-12-26 DIAGNOSIS — D1800 Hemangioma unspecified site: Secondary | ICD-10-CM

## 2021-12-26 NOTE — Patient Instructions (Signed)
Cryotherapy Aftercare  Wash gently with soap and water everyday.   Apply Vaseline and Band-Aid daily until healed.     Due to recent changes in healthcare laws, you may see results of your pathology and/or laboratory studies on MyChart before the doctors have had a chance to review them. We understand that in some cases there may be results that are confusing or concerning to you. Please understand that not all results are received at the same time and often the doctors may need to interpret multiple results in order to provide you with the best plan of care or course of treatment. Therefore, we ask that you please give us 2 business days to thoroughly review all your results before contacting the office for clarification. Should we see a critical lab result, you will be contacted sooner.   If You Need Anything After Your Visit  If you have any questions or concerns for your doctor, please call our main line at [phone] and press option 4 to reach your doctor's medical assistant. If no one answers, please leave a voicemail as directed and we will return your call as soon as possible. Messages left after 4 pm will be answered the following business day.   You may also send us a message via MyChart. We typically respond to MyChart messages within 1-2 business days.  For prescription refills, please ask your pharmacy to contact our office. Our fax number is [phone].  If you have an urgent issue when the clinic is closed that cannot wait until the next business day, you can page your doctor at the number below.    Please note that while we do our best to be available for urgent issues outside of office hours, we are not available 24/7.   If you have an urgent issue and are unable to reach us, you may choose to seek medical care at your doctor's office, retail clinic, urgent care center, or emergency room.  If you have a medical emergency, please immediately call 911 or go to the  emergency department.  Pager Numbers  - Dr. Kowalski: [phone]  - Dr. Moye: [phone]  - Dr. Stewart: [phone]  In the event of inclement weather, please call our main line at [phone] for an update on the status of any delays or closures.  Dermatology Medication Tips: Please keep the boxes that topical medications come in in order to help keep track of the instructions about where and how to use these. Pharmacies typically print the medication instructions only on the boxes and not directly on the medication tubes.   If your medication is too expensive, please contact our office at [phone] option 4 or send us a message through MyChart.   We are unable to tell what your co-pay for medications will be in advance as this is different depending on your insurance coverage. However, we may be able to find a substitute medication at lower cost or fill out paperwork to get insurance to cover a needed medication.   If a prior authorization is required to get your medication covered by your insurance company, please allow us 1-2 business days to complete this process.  Drug prices often vary depending on where the prescription is filled and some pharmacies may offer cheaper prices.  The website www.goodrx.com contains coupons for medications through different pharmacies. The prices here do not account for what the cost may be with help from insurance (it may be cheaper with your insurance), but the website can   give you the price if you did not use any insurance.  - You can print the associated coupon and take it with your prescription to the pharmacy.  - You may also stop by our office during regular business hours and pick up a GoodRx coupon card.  - If you need your prescription sent electronically to a different pharmacy, notify our office through [facility] MyChart or by phone at [phone] option 4.     Si Usted Necesita Algo Despus de Su Visita  Tambin puede  enviarnos un mensaje a travs de MyChart. Por lo general respondemos a los mensajes de MyChart en el transcurso de 1 a 2 das hbiles.  Para renovar recetas, por favor pida a su farmacia que se ponga en contacto con nuestra oficina. Nuestro nmero de fax es el [phone].  Si tiene un asunto urgente cuando la clnica est cerrada y que no puede esperar hasta el siguiente da hbil, puede llamar/localizar a su doctor(a) al nmero que aparece a continuacin.   Por favor, tenga en cuenta que aunque hacemos todo lo posible para estar disponibles para asuntos urgentes fuera del horario de oficina, no estamos disponibles las 24 horas del da, los 7 das de la semana.   Si tiene un problema urgente y no puede comunicarse con nosotros, puede optar por buscar atencin mdica  en el consultorio de su doctor(a), en una clnica privada, en un centro de atencin urgente o en una sala de emergencias.  Si tiene una emergencia mdica, por favor llame inmediatamente al 911 o vaya a la sala de emergencias.  Nmeros de bper  - Dr. Kowalski: [phone]  - Dra. Moye: [phone]  - Dra. Stewart: [phone]  En caso de inclemencias del tiempo, por favor llame a nuestra lnea principal al [phone] para una actualizacin sobre el estado de cualquier retraso o cierre.  Consejos para la medicacin en dermatologa: Por favor, guarde las cajas en las que vienen los medicamentos de uso tpico para ayudarle a seguir las instrucciones sobre dnde y cmo usarlos. Las farmacias generalmente imprimen las instrucciones del medicamento slo en las cajas y no directamente en los tubos del medicamento.   Si su medicamento es muy caro, por favor, pngase en contacto con nuestra oficina llamando al [phone] y presione la opcin 4 o envenos un mensaje a travs de MyChart.   No podemos decirle cul ser su copago por los medicamentos por adelantado ya que esto es diferente dependiendo de la cobertura de su seguro.  Sin embargo, es posible que podamos encontrar un medicamento sustituto a menor costo o llenar un formulario para que el seguro cubra el medicamento que se considera necesario.   Si se requiere una autorizacin previa para que su compaa de seguros cubra su medicamento, por favor permtanos de 1 a 2 das hbiles para completar este proceso.  Los precios de los medicamentos varan con frecuencia dependiendo del lugar de dnde se surte la receta y alguna farmacias pueden ofrecer precios ms baratos.  El sitio web www.goodrx.com tiene cupones para medicamentos de diferentes farmacias. Los precios aqu no tienen en cuenta lo que podra costar con la ayuda del seguro (puede ser ms barato con su seguro), pero el sitio web puede darle el precio si no utiliz ningn seguro.  - Puede imprimir el cupn correspondiente y llevarlo con su receta a la farmacia.  - Tambin puede pasar por nuestra oficina durante el horario de atencin regular y recoger una tarjeta de cupones de GoodRx.  -   Si necesita que su receta se enve electrnicamente a una farmacia diferente, informe a nuestra oficina a travs de MyChart de [place] o por telfono llamando al [phone] y presione la opcin 4.  

## 2021-12-26 NOTE — Progress Notes (Signed)
Follow-Up Visit   Subjective  [name] is a 71 y.o. male who presents for the following: Follow-up (AK follow up - face treated with LN2 and 5FU/Calcipotriene cream to hands in February. The patient presents for Upper Body Skin Exam (UBSE) for skin cancer screening and mole check.  The patient has spots, moles and lesions to be evaluated, some may be new or changing and the patient has concerns that these could be cancer./).  The following portions of the chart were reviewed this encounter and updated as appropriate:   Tobacco  Allergies  Meds  Problems  Med Hx  Surg Hx  Fam Hx     Review of Systems:  No other skin or systemic complaints except as noted in HPI or Assessment and Plan.  Objective  Well appearing patient in no apparent distress; mood and affect are within normal limits.  All skin waist up examined.  Left hands x 4, right hand x 2, face/ears x 5 (11) Erythematous thin papules/macules with gritty scale.   right side/infra axillary x 1, left lower leg x 2 (3) Erythematous stuck-on, waxy papule or plaque   Assessment & Plan   History of Squamous Cell Carcinoma of the Skin - No evidence of recurrence today - No lymphadenopathy - Recommend regular full body skin exams - Recommend daily broad spectrum sunscreen SPF 30+ to sun-exposed areas, reapply every 2 hours as needed.  - Call if any new or changing lesions are noted between office visits  Lentigines - Scattered tan macules - Due to sun exposure - Benign-appearing, observe - Recommend daily broad spectrum sunscreen SPF 30+ to sun-exposed areas, reapply every 2 hours as needed. - Call for any changes  Seborrheic Keratoses - Stuck-on, waxy, tan-brown papules and/or plaques  - Benign-appearing - Discussed benign etiology and prognosis. - Observe - Call for any changes  Melanocytic Nevi - Tan-brown and/or pink-flesh-colored symmetric macules and papules - Benign appearing on exam today -  Observation - Call clinic for new or changing moles - Recommend daily use of broad spectrum spf 30+ sunscreen to sun-exposed areas.   Hemangiomas - Red papules - Discussed benign nature - Observe - Call for any changes  Actinic Damage - Chronic condition, secondary to cumulative UV/sun exposure - diffuse scaly erythematous macules with underlying dyspigmentation - Recommend daily broad spectrum sunscreen SPF 30+ to sun-exposed areas, reapply every 2 hours as needed.  - Staying in the shade or wearing long sleeves, sun glasses (UVA+UVB protection) and wide brim hats (4-inch brim around the entire circumference of the hat) are also recommended for sun protection.  - Call for new or changing lesions.  Skin cancer screening performed today.  AK (actinic keratosis) (11) Left hands x 4, right hand x 2, face/ears x 5  Actinic Damage - Severe, confluent actinic changes with pre-cancerous actinic keratoses  - Severe, chronic, not at goal, secondary to cumulative UV radiation exposure over time - diffuse scaly erythematous macules and papules with underlying dyspigmentation - Discussed Prescription "Field Treatment" for Severe, Chronic Confluent Actinic Changes with Pre-Cancerous Actinic Keratoses Field treatment involves treatment of an entire area of skin that has confluent Actinic Changes (Sun/ Ultraviolet light damage) and PreCancerous Actinic Keratoses by method of PhotoDynamic Therapy (PDT) and/or prescription Topical Chemotherapy agents such as 5-fluorouracil, 5-fluorouracil/calcipotriene, and/or imiquimod.  The purpose is to decrease the number of clinically evident and subclinical PreCancerous lesions to prevent progression to development of skin cancer by chemically destroying early precancer changes that may  or may not be visible.  It has been shown to reduce the risk of developing skin cancer in the treated area. As a result of treatment, redness, scaling, crusting, and open sores may  occur during treatment course. One or more than one of these methods may be used and may have to be used several times to control, suppress and eliminate the PreCancerous changes. Discussed treatment course, expected reaction, and possible side effects. - Recommend daily broad spectrum sunscreen SPF 30+ to sun-exposed areas, reapply every 2 hours as needed.  - Staying in the shade or wearing long sleeves, sun glasses (UVA+UVB protection) and wide brim hats (4-inch brim around the entire circumference of the hat) are also recommended. - Call for new or changing lesions.  If anything remaining on hands after 4 weeks, restart 5FU/Calcipotriene cream  Destruction of lesion - Left hands x 4, right hand x 2, face/ears x 5 Complexity: simple   Destruction method: cryotherapy   Informed consent: discussed and consent obtained   Timeout:  patient name, date of birth, surgical site, and procedure verified Lesion destroyed using liquid nitrogen: Yes   Region frozen until ice ball extended beyond lesion: Yes   Outcome: patient tolerated procedure well with no complications   Post-procedure details: wound care instructions given    Inflamed seborrheic keratosis (3) right side/infra axillary x 1, left lower leg x 2  Destruction of lesion - right side/infra axillary x 1, left lower leg x 2 Complexity: simple   Destruction method: cryotherapy   Informed consent: discussed and consent obtained   Timeout:  patient name, date of birth, surgical site, and procedure verified Lesion destroyed using liquid nitrogen: Yes   Region frozen until ice ball extended beyond lesion: Yes   Outcome: patient tolerated procedure well with no complications   Post-procedure details: wound care instructions given     Return in about 6 months (around 06/28/2022).  I, Ashok Cordia, CMA, am acting as scribe for Sarina Ser, MD . Documentation: I have reviewed the above documentation for accuracy and completeness, and I  agree with the above.  Sarina Ser, MD

## 2022-01-04 ENCOUNTER — Encounter: Payer: Self-pay | Admitting: Dermatology

## 2022-01-23 ENCOUNTER — Other Ambulatory Visit: Payer: Medicare Other

## 2022-01-23 DIAGNOSIS — N401 Enlarged prostate with lower urinary tract symptoms: Secondary | ICD-10-CM

## 2022-01-23 DIAGNOSIS — E291 Testicular hypofunction: Secondary | ICD-10-CM

## 2022-01-24 LAB — TESTOSTERONE: Testosterone: 676 ng/dL (ref 264–916)

## 2022-01-24 LAB — HEMATOCRIT: Hematocrit: 51.2 % — ABNORMAL HIGH (ref 37.5–51.0)

## 2022-01-24 LAB — PSA: Prostate Specific Ag, Serum: 1.2 ng/mL (ref 0.0–4.0)

## 2022-01-27 ENCOUNTER — Encounter: Payer: Self-pay | Admitting: *Deleted

## 2022-02-07 ENCOUNTER — Inpatient Hospital Stay: Payer: Medicare Other

## 2022-02-07 ENCOUNTER — Inpatient Hospital Stay: Payer: Medicare Other | Attending: Internal Medicine

## 2022-02-07 VITALS — BP 160/99 | HR 90 | Temp 98.5°F | Resp 18

## 2022-02-07 DIAGNOSIS — D751 Secondary polycythemia: Secondary | ICD-10-CM | POA: Diagnosis present

## 2022-02-07 LAB — HEMOGLOBIN: Hemoglobin: 18.3 g/dL — ABNORMAL HIGH (ref 13.0–17.0)

## 2022-02-07 LAB — HEMATOCRIT: HCT: 53.1 % — ABNORMAL HIGH (ref 39.0–52.0)

## 2022-02-07 NOTE — Progress Notes (Signed)
HVT 53.1 ; Proceeded with 300 ml phlebotomy. Pt tolerated procedure well. Accepted a beverage. VSS. Discharged to home feeling well.

## 2022-02-07 NOTE — Patient Instructions (Signed)

## 2022-03-20 ENCOUNTER — Telehealth: Payer: Self-pay

## 2022-03-20 ENCOUNTER — Other Ambulatory Visit: Payer: Self-pay | Admitting: *Deleted

## 2022-03-20 DIAGNOSIS — E291 Testicular hypofunction: Secondary | ICD-10-CM

## 2022-03-20 NOTE — Telephone Encounter (Signed)
Message left on triage line- 438pm  Mandy with [facility] states pt is needing a refill of testosterone. Recently had all prescriptions transferred to [place] from [facility].   Pt also left a message on triage line at 440pm stating he sent a message requesting a refill of testosterone 77m vial sent to [facility].   Pls advise.   CM

## 2022-03-23 MED ORDER — TESTOSTERONE CYPIONATE 200 MG/ML IM SOLN: INTRAMUSCULAR | 0 refills | Status: DC

## 2022-03-27 MED ORDER — TESTOSTERONE CYPIONATE 200 MG/ML IM SOLN: INTRAMUSCULAR | 0 refills | Status: DC

## 2022-03-27 NOTE — Telephone Encounter (Signed)
Pt calling again asking about refill, per pt at [facility] they give him 53m vials, and he can get 148mmulti dose vial at [facility]. Pt has been using the 68m868mial twice, I advised he should not that it's only for 1 dose. He was trying to draw up the remaining 0.5ml24mse from the 68ml 54ml. Pt would like to switch to [facility], he DID NOT pick up recent rx that was sent to [facility] on 03/23/2022.  Please advise

## 2022-04-10 ENCOUNTER — Ambulatory Visit: Payer: Medicare Other | Admitting: Urology

## 2022-04-29 ENCOUNTER — Ambulatory Visit
Admission: RE | Admit: 2022-04-29 | Discharge: 2022-04-29 | Disposition: A | Discharge status: Home / Self Care | Payer: Medicare Other | Source: Ambulatory Visit | Attending: Family Medicine | Admitting: Family Medicine

## 2022-04-29 VITALS — BP 145/93 | HR 66 | Temp 97.6°F | Resp 16

## 2022-04-29 DIAGNOSIS — Z1152 Encounter for screening for COVID-19: Secondary | ICD-10-CM | POA: Diagnosis not present

## 2022-04-29 DIAGNOSIS — J069 Acute upper respiratory infection, unspecified: Secondary | ICD-10-CM | POA: Insufficient documentation

## 2022-04-29 LAB — RESP PANEL BY RT-PCR (RSV, FLU A&B, COVID)  RVPGX2
Influenza A by PCR: NEGATIVE
Influenza B by PCR: NEGATIVE
Resp Syncytial Virus by PCR: POSITIVE — AB
SARS Coronavirus 2 by RT PCR: NEGATIVE

## 2022-04-29 MED ORDER — PROMETHAZINE-DM 6.25-15 MG/5ML PO SYRP: 5.0000 mL | ORAL_SOLUTION | Freq: Four times a day (QID) | ORAL | 0 refills | Status: DC | PRN

## 2022-04-29 NOTE — ED Triage Notes (Signed)
Pt presents with cough, congestion and headache x 1 week.

## 2022-04-29 NOTE — Discharge Instructions (Addendum)
We will contact you if your COVID/influenza/RSV test is positive.  Please quarantine while you wait for the results.  If your test is negative you may resume normal activities.  If your test is positive please continue to quarantine for at least 5 days from your symptom onset or until you are without a fever for at least 24 hours after the medications.   You can take Tylenol and/or Ibuprofen as needed for fever reduction and pain relief.    For cough: honey 1/2 to 1 teaspoon (you can dilute the honey in water or another fluid).  You can also use guaifenesin and dextromethorphan for cough. You can use a humidifier for chest congestion and cough.  If you don't have a humidifier, you can sit in the bathroom with the hot shower running.      For sore throat: try warm salt water gargles, cepacol lozenges, throat spray, warm tea or water with lemon/honey, popsicles or ice, or OTC cold relief medicine for throat discomfort.    For congestion: take a daily anti-histamine like Zyrtec, Claritin, and a oral decongestant, such as pseudoephedrine.  You can also use Flonase 1-2 sprays in each nostril daily.    It is important to stay hydrated: drink plenty of fluids (water, gatorade/powerade/pedialyte, juices, or teas) to keep your throat moisturized and help further relieve irritation/discomfort.    Return or go to the Emergency Department if symptoms worsen or do not improve in the next few days

## 2022-04-29 NOTE — ED Provider Notes (Signed)
[facility]    CSN: [id number] Arrival date & time: 04/29/22  0944      History   Chief Complaint Chief Complaint  Patient presents with   Headache   Nasal Congestion   Cough    HPI [name] is a 71 y.o. male.   HPI   [name] presents for cough, nasal congestion and intermittent headache for the past 4-5 days.  Says that he has allergies and typically gets something like this at least once a year.  Of note, some members of his family are also sick.  His wife is also being seen here.  Denies sore throat, ear pain, body aches, vomiting or diarrhea.  He has been eating and drinking well.  He has not taken a home COVID test.  No history of asthma or smoking.    Past Medical History:  Diagnosis Date   Actinic keratosis    Anxiety    Arthritis    Back pain    MULTIPLE BACK SURGERIES   Basal cell carcinoma 10/09/2016   L sup med scapula    Basal cell carcinoma 07/11/2019   L lat forehead above lat brow    Basal cell carcinoma 08/29/2020   R prox medial pretibial, EDC   Basal cell carcinoma 08/29/2020   R posterior ear, [facility]   BCC (basal cell carcinoma of skin) 12/27/2020   R epigastric - ED&C   Benign essential HTN 12/07/2014   Last Assessment & Plan:  Relevant Hx: Course: Daily Update: Today's Plan: CONTROLLED ON MEDS   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/31/2013   Bilateral sciatica    Chronic back pain    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   Cirrhosis (HCC)    DDD (degenerative disc disease), cervical 11/06/2014   DDD (degenerative disc disease), lumbar 11/06/2014   Depression    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 Jacqlyn Larsen is his urologist.     Patient Active Problem List   Diagnosis Date Noted   Chronic pain disorder 10/18/2020   Leukopenia 03/08/2020   Vitamin B12 deficiency 03/08/2020   Vitamin D deficiency 03/08/2020   Short-term memory loss 02/29/2020   S/P total knee arthroplasty, left 02/29/2020   Primary osteoarthritis of left knee 12/18/2019   Skin infection 09/19/2019   Hypogonadism in male 01/06/2019   Bilateral sciatica 11/12/2017   Anxiety and depression 11/05/2017   Obesity (BMI 30.0-34.9) 07/17/2017   Nephrolithiasis 06/10/2017   Hydronephrosis with urinary obstruction due to ureteral calculus 09/30/2016   Personal history of other infectious and parasitic diseases 12/14/2015   History of hepatitis C virus infection 12/14/2015   Olecranon bursitis of left elbow 03/13/2015   Other infective bursitis, left elbow 01/23/2015   Difficulty in swallowing    Gastritis    Ectopic  gastric tissue    Hx of colonic polyps    Benign neoplasm of cecum    First degree hemorrhoids    Benign essential HTN 12/07/2014   HCV (hepatitis C virus) 12/07/2014   Apnea, sleep 12/07/2014   Spinal stenosis 12/07/2014   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   Skin cancer 12/07/2014   Secondary erythrocytosis 11/28/2014   DDD (degenerative disc disease), lumbar 11/06/2014   DDD (degenerative disc disease), cervical 11/06/2014   Status post cervical spinal fusion 11/06/2014   Status post lumbar laminectomy 11/06/2014   Sacroiliac joint dysfunction 11/06/2014   Encounter for long-term (current) use of medications 10/19/2013   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   Chronic  inflammation of tunica albuginea 03/31/2013   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/31/2013   Incomplete emptying of bladder 03/31/2013   Retention of urine, unspecified 03/31/2013   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   Idiopathic scoliosis and kyphoscoliosis 03/01/2013   Elevated glucose 12/10/2011   Testicular dysfunction 11/04/2011    Past Surgical History:  Procedure Laterality Date   BACK SURGERY  04/14/2013   L2-S1   Cancer of right ear     3/17   CATARACT EXTRACTION Left 06/10/1987   CERVICAL SPINE SURGERY      X2 FUSION C4-7   CHOLECYSTECTOMY  07/10/2013   COLONOSCOPY WITH PROPOFOL N/A 12/18/2014   Procedure: COLONOSCOPY WITH PROPOFOL;  Surgeon: Lucilla Lame, MD;  Location: [place];  Service: Endoscopy;  Laterality: N/A;   COLONOSCOPY WITH PROPOFOL N/A 09/23/2021   Procedure: COLONOSCOPY WITH PROPOFOL;  Surgeon: Lesly Rubenstein, MD;  Location: [facility];  Service: Endoscopy;  Laterality: N/A;   CYSTOSCOPY WITH INSERTION OF UROLIFT N/A 04/02/2021   Procedure: CYSTOSCOPY WITH INSERTION OF UROLIFT;  Surgeon: Abbie Sons, MD;  Location: [facility];  Service: Urology;  Laterality: N/A;   ELBOW ARTHROSCOPY Right 02/08/2012   ELBOW SURGERY Left    ESOPHAGOGASTRODUODENOSCOPY (EGD) WITH PROPOFOL N/A 12/18/2014   Procedure: ESOPHAGOGASTRODUODENOSCOPY (EGD) WITH PROPOFOL with dialtion;  Surgeon: Lucilla Lame, MD;  Location: [place];  Service: Endoscopy;  Laterality: N/A;   ESOPHAGOGASTRODUODENOSCOPY (EGD) WITH PROPOFOL N/A 09/23/2021   Procedure: ESOPHAGOGASTRODUODENOSCOPY (EGD) WITH PROPOFOL;  Surgeon: Lesly Rubenstein, MD;  Location: [facility];  Service: Endoscopy;  Laterality: N/A;   FACIAL RECONSTRUCTION SURGERY  06/09/1968   BOTTLE CUT NOSE AND EYE, MULTIPLE SURGERIES   FINGER SURGERY Left 06/09/2013   THUMB   FINGER SURGERY Right 06/09/1964   MIDDLE   FOOT SURGERY Right 72/90/2111   FUNDIPLICATION  55/20/8022    HERNIA REPAIR Left 1981/1957   RIGHT SIDE IN 1957   JOINT REPLACEMENT Left 2021   knee   KNEE ARTHROPLASTY Left 02/10/2020   Procedure: COMPUTER ASSISTED TOTAL KNEE ARTHROPLASTY - RNFA;  Surgeon: Dereck Leep, MD;  Location: [facility];  Service: Orthopedics;  Laterality: Left;   KNEE ARTHROSCOPY Right 08/14/2021   Procedure: ARTHROSCOPY KNEE, medial menisectomy and chondroplasty, loose bodies removal;  Surgeon: Dereck Leep, MD;  Location: [facility];  Service: Orthopedics;  Laterality: Right;   MASS EXCISION Left 02/16/2015   Procedure: LEFT BURSA ELBOW EXCISION;  Surgeon: Leanor Kail, MD;  Location: [place];  Service: Orthopedics;  Laterality: Left;   NISSEN FUNDOPLICATION  33/61/2244   NOSE SURGERY  12/01/2002   SEPTUM RECONSTRUCTION   POLYPECTOMY  12/18/2014   Procedure: POLYPECTOMY;  Surgeon: Lucilla Lame, MD;  Location: [place];  Service: Endoscopy;;   SEPTOPLASTY  06/09/1997   SHOULDER SURGERY  Right 06/09/1981   SHOULDER SURGERY Left 06/09/1977   WITH ELBOW (TENDON RELEASE)   SKIN SURGERY  06/09/2002   SKIN CANCER NOSE       Home Medications    Prior to Admission medications   Medication Sig Start Date End Date Taking? Authorizing Provider  promethazine-dextromethorphan (PROMETHAZINE-DM) 6.25-15 MG/5ML syrup Take 5 mLs by mouth 4 (four) times daily as needed. 04/29/22  Yes Hoorain Kozakiewicz, DO  amLODipine (NORVASC) 10 MG tablet Take 10 mg by mouth every morning. 07/12/18   [provider]  Brimonidine Tartrate (LUMIFY) 0.025 % SOLN Place 1 drop into both eyes 2 (two) times daily as needed (redness).    [provider]  celecoxib (CELEBREX) 200 MG capsule Take 200 mg by mouth 2 (two) times daily. 12/12/20   [provider]  Cholecalciferol 125 MCG (5000 UT) TABS Take 5,000 Units by mouth daily. 03/08/20   [provider]  clonazePAM (KLONOPIN) 0.5 MG tablet Take 0.5-1 mg by mouth 2 (two) times daily as needed for  anxiety. 12/18/16   [provider]  cyclobenzaprine (FLEXERIL) 10 MG tablet Take 10 mg by mouth 3 (three) times daily as needed for muscle spasms. 10/18/20   [provider]  DULoxetine (CYMBALTA) 60 MG capsule 60 mg every morning. 12/12/20   [provider]  furosemide (LASIX) 40 MG tablet Take 40 mg by mouth daily as needed for fluid.     [provider]  Multiple Vitamin (MULTIVITAMIN WITH MINERALS) TABS tablet Take 1 tablet by mouth daily.    [provider]  NEEDLE, DISP, 23 G 23G X 1" MISC Use 1 Units every 10 (ten) days. 07/05/14   [provider]  sodium fluoride (FLUORISHIELD) 1.1 % GEL dental gel Place 1 application onto teeth daily. 02/14/20   [provider]  spironolactone (ALDACTONE) 25 MG tablet Take 25 mg by mouth daily. AM    [provider]  testosterone cypionate (DEPOTESTOSTERONE CYPIONATE) 200 MG/ML injection ADMINISTER 0.5 ML IN THE MUSCLE 1 TIME A WEEK 03/27/22   Stoioff, Ronda Fairly, MD  traZODone (DESYREL) 50 MG tablet Take 50 mg by mouth at bedtime as needed for sleep. 07/12/18 08/10/27  [provider]  vitamin B-12 (CYANOCOBALAMIN) 1000 MCG tablet Take 2,000 mcg by mouth daily. 03/08/20   [provider]    Family History Family History  Problem Relation Age of Onset   Arthritis Mother    Asthma Mother    Cancer Mother    Heart disease Father     Social History Social History   Tobacco Use   Smoking status: Never   Smokeless tobacco: Never  Vaping Use   Vaping Use: Never used  Substance Use Topics   Alcohol use: Yes    Alcohol/week: 1.0 standard drink of alcohol    Types: 1 Glasses of wine per week    Comment: 1-2 drinks every night   Drug use: No     Allergies   Gabapentin and Lamotrigine   Review of Systems Review of Systems: negative unless otherwise stated in HPI.      Physical Exam Triage Vital Signs ED Triage Vitals  Enc Vitals Group     BP 04/29/22 1023 (!)  145/93     Pulse Rate 04/29/22 1023 66     Resp 04/29/22 1023 16     Temp 04/29/22 1023 97.6 F (36.4 C)     Temp Source 04/29/22 1023 Oral     SpO2 04/29/22 1023 97 %  Weight --      Height --      Head Circumference --      Peak Flow --      Pain Score 04/29/22 1021 0     Pain Loc --      Pain Edu? --      Excl. in [place]? --    No data found.  Updated Vital Signs BP (!) 145/93 (BP Location: Left Arm)   Pulse 66   Temp 97.6 F (36.4 C) (Oral)   Resp 16   SpO2 97%   Visual Acuity Right Eye Distance:   Left Eye Distance:   Bilateral Distance:    Right Eye Near:   Left Eye Near:    Bilateral Near:     Physical Exam GEN:     alert, non-toxic appearing male in no distress     HENT:  mucus membranes moist, oropharyngeal  without lesions or  exudate, no  tonsillar hypertrophy,  no oropharyngeal erythema, no nasal discharge,  bilateral TM  normal EYES:   pupils equal and reactive, no scleral injection NECK:  normal ROM,  no meningismus   RESP:  no increased work of breathing, clear to auscultation bilaterally CVS:   regular rate and rhythm Skin:   warm and dry, no rash on visible skin, normal skin turgor    UC Treatments / Results  Labs (all labs ordered are listed, but only abnormal results are displayed) Labs Reviewed  RESP PANEL BY RT-PCR (RSV, FLU A&B, COVID)  [id number]    EKG   Radiology No results found.  Procedures Procedures (including critical care time)  Medications Ordered in UC Medications - No data to display  Initial Impression / Assessment and Plan / UC Course  I have reviewed the triage vital signs and the nursing notes.  Pertinent labs & imaging results that were available during my care of the patient were reviewed by me and considered in my medical decision making (see chart for details).       Pt is a 71 y.o. male who presents for 4-5 days of respiratory symptoms. [name] is afebrile here without recent antipyretics. Satting well on  room air. Overall pt is well appearing, well hydrated, without respiratory distress. Pulmonary exam is unremarkable.  RSV, COVID and influenza testing obtained. Pt to quarantine until COVID test results or longer if positive.  I will call patient with test results, if positive. History consistent with viral respiratory illness. Discussed symptomatic treatment.  Promethazine DM for cough. Continue Mucinex. Explained lack of efficacy of antibiotics in viral disease.  Typical duration of symptoms discussed. Return and ED precautions given and patient voiced understanding.  Discussed MDM, treatment plan and plan for follow-up with patient who agrees with plan.     Final Clinical Impressions(s) / UC Diagnoses   Final diagnoses:  Viral upper respiratory illness     Discharge Instructions      We will contact you if your COVID/influenza/RSV test is positive.  Please quarantine while you wait for the results.  If your test is negative you may resume normal activities.  If your test is positive please continue to quarantine for at least 5 days from your symptom onset or until you are without a fever for at least 24 hours after the medications.   You can take Tylenol and/or Ibuprofen as needed for fever reduction and pain relief.    For cough: honey 1/2 to 1 teaspoon (you can dilute the honey in water or  another fluid).  You can also use guaifenesin and dextromethorphan for cough. You can use a humidifier for chest congestion and cough.  If you don't have a humidifier, you can sit in the bathroom with the hot shower running.      For sore throat: try warm salt water gargles, cepacol lozenges, throat spray, warm tea or water with lemon/honey, popsicles or ice, or OTC cold relief medicine for throat discomfort.    For congestion: take a daily anti-histamine like Zyrtec, Claritin, and a oral decongestant, such as pseudoephedrine.  You can also use Flonase 1-2 sprays in each nostril daily.    It is  important to stay hydrated: drink plenty of fluids (water, gatorade/powerade/pedialyte, juices, or teas) to keep your throat moisturized and help further relieve irritation/discomfort.    Return or go to the Emergency Department if symptoms worsen or do not improve in the next few days      ED Prescriptions     Medication Sig Dispense Auth. Provider   promethazine-dextromethorphan (PROMETHAZINE-DM) 6.25-15 MG/5ML syrup Take 5 mLs by mouth 4 (four) times daily as needed. 118 mL Lyndee Hensen, DO      PDMP not reviewed this encounter.   Lyndee Hensen, DO 04/29/22 1054

## 2022-04-30 ENCOUNTER — Encounter: Payer: Self-pay | Admitting: Emergency Medicine

## 2022-04-30 ENCOUNTER — Telehealth: Payer: Self-pay | Admitting: Emergency Medicine

## 2022-04-30 MED ORDER — BENZONATATE 100 MG PO CAPS: 100.0000 mg | ORAL_CAPSULE | Freq: Three times a day (TID) | ORAL | 0 refills | Status: DC

## 2022-04-30 MED ORDER — AZITHROMYCIN 250 MG PO TABS: 250.0000 mg | ORAL_TABLET | Freq: Every day | ORAL | 0 refills | Status: DC

## 2022-04-30 NOTE — Telephone Encounter (Signed)
Seen in urgent care by MD 1 day ago, contacted via telephone by his wife Mrs. Winfield Cunas requesting patient receive antibiotic, discussed that he tested positive for RSV which is a virus and we will need the body to allow time to process and fight infection, denies any respiratory distress, was prescribed Promethazine DM at time of visit, added Tessalon as cough is most worrisome symptom, watchful wait antibiotic placed at pharmacy for day 10 of illness, azithromycin available on Monday

## 2022-05-09 ENCOUNTER — Inpatient Hospital Stay: Payer: Medicare Other

## 2022-05-16 ENCOUNTER — Inpatient Hospital Stay: Payer: Medicare Other

## 2022-05-16 ENCOUNTER — Inpatient Hospital Stay: Payer: Medicare Other | Attending: Internal Medicine

## 2022-05-16 DIAGNOSIS — D751 Secondary polycythemia: Secondary | ICD-10-CM | POA: Insufficient documentation

## 2022-05-16 DIAGNOSIS — R5383 Other fatigue: Secondary | ICD-10-CM | POA: Insufficient documentation

## 2022-05-16 LAB — HEMOGLOBIN: Hemoglobin: 18 g/dL — ABNORMAL HIGH (ref 13.0–17.0)

## 2022-05-16 LAB — HEMATOCRIT: HCT: 52.7 % — ABNORMAL HIGH (ref 39.0–52.0)

## 2022-06-05 DIAGNOSIS — R911 Solitary pulmonary nodule: Secondary | ICD-10-CM | POA: Insufficient documentation

## 2022-06-12 ENCOUNTER — Other Ambulatory Visit: Payer: Self-pay | Admitting: Gerontology

## 2022-06-12 DIAGNOSIS — R911 Solitary pulmonary nodule: Secondary | ICD-10-CM

## 2022-06-12 DIAGNOSIS — J40 Bronchitis, not specified as acute or chronic: Secondary | ICD-10-CM

## 2022-06-12 DIAGNOSIS — Z0000 Encounter for general adult medical examination without abnormal findings: Secondary | ICD-10-CM

## 2022-06-12 DIAGNOSIS — R051 Acute cough: Secondary | ICD-10-CM

## 2022-06-23 ENCOUNTER — Encounter: Payer: Self-pay | Admitting: Internal Medicine

## 2022-06-30 ENCOUNTER — Ambulatory Visit: Payer: Medicare Other | Admitting: Dermatology

## 2022-07-17 DIAGNOSIS — G8929 Other chronic pain: Secondary | ICD-10-CM | POA: Diagnosis not present

## 2022-07-17 DIAGNOSIS — Z96652 Presence of left artificial knee joint: Secondary | ICD-10-CM | POA: Diagnosis not present

## 2022-07-17 DIAGNOSIS — M25561 Pain in right knee: Secondary | ICD-10-CM | POA: Diagnosis not present

## 2022-07-17 DIAGNOSIS — Z981 Arthrodesis status: Secondary | ICD-10-CM | POA: Diagnosis not present

## 2022-07-17 DIAGNOSIS — Z9889 Other specified postprocedural states: Secondary | ICD-10-CM | POA: Diagnosis not present

## 2022-07-21 ENCOUNTER — Encounter: Payer: Self-pay | Admitting: Internal Medicine

## 2022-07-25 NOTE — Progress Notes (Unsigned)
Referring Physician:  Dereck Leep, MD [place],  [place] [address]  Primary Physician:  Latanya Maudlin, NP  History of Present Illness: 07/30/2022 [name] has a history of sleep apnea, skin CA, hep C, GERD, gout, HTN, peripheral neuropathy.   He has history of lumbar laminectomy and facetectomy at [facility] in 2014 with Dr. Ellender Hose. Also with history of posterior cervical laminectomy and foraminotomy with Dr. Myrene Buddy. He also had ACDFC3-C6 along with posterior cervical fusion C3-C6.  He subsequently underwent L3-S1 fusion in 2019 with Dr. Loreen Freud. He has seen Dr. Alba Destine in the past.   History of chronic LBP x years with bilateral leg pain in his thighs. He has known knee OA in right knee and painful left TKA. He also has known neuropathy in both arms/legs.   He has constant LBP with intermittent spasms. Pain is worse with twisting or bending, this causes right lateral thigh pain and left posterior thigh pain- this pain is intermittent. As above, he has neuropathy in his legs with numbness, tingling, and weakness.   Bowel/Bladder Dysfunction: none  Conservative measures:  Physical therapy: no recent PT for his back- has made him worse in the past.  Multimodal medical therapy including regular antiinflammatories: celebrex  Injections:  Bilateral S1 TF ESI on 04/19/21 Alba Destine with no relief  Bilateral S1 TFESI 03/29/2021 Alba Destine with no relief  Had multiple injections in the past with pain management.   Past Surgery:  - lumbar laminectomy and facetectomy at [facility] in 2014 with Dr. Ellender Hose. Also with history of posterior cervical laminectomy and foraminotomy with Dr. Myrene Buddy. He also had ACDFC3-C6 along with posterior cervical fusion C3-C6. - L3-S1 fusion in 2019 with Dr. Loreen Freud.   [name] has no symptoms of cervical myelopathy. He has neuropathy in his hands and has some dexterity issues. Has some balance issues with  his knees.   The symptoms are causing a significant impact on the patient's life.   Review of Systems:  A 10 point review of systems is negative, except for the pertinent positives and negatives detailed in the HPI.  Past Medical History: Past Medical History:  Diagnosis Date   Actinic keratosis    Anxiety    Arthritis    Back pain    MULTIPLE BACK SURGERIES   Basal cell carcinoma 10/09/2016   L sup med scapula    Basal cell carcinoma 07/11/2019   L lat forehead above lat brow    Basal cell carcinoma 08/29/2020   R prox medial pretibial, EDC   Basal cell carcinoma 08/29/2020   R posterior ear, [facility]   BCC (basal cell carcinoma of skin) 12/27/2020   R epigastric - ED&C   Benign essential HTN 12/07/2014   Last Assessment & Plan:  Relevant Hx: Course: Daily Update: Today's Plan: CONTROLLED ON MEDS   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/31/2013   Bilateral sciatica    Chronic back pain    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   Cirrhosis (HCC)    DDD (degenerative disc disease), cervical 11/06/2014   DDD (degenerative disc disease), lumbar 11/06/2014   Depression    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   Overview:  severe in feet    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   face, numerous lesions removed    Erythrocytosis    Esophagitis, reflux 12/07/2014   GERD (gastroesophageal reflux disease)    had surgery   HCV (hepatitis C virus)  12/07/2014   Hepatitis C    DID HARVONI TREATMENT/ NO LONGER HEP C POSITIVE   Idiopathic scoliosis and kyphoscoliosis 03/01/2013   Leukopenia    Neuropathy of both feet    Painful legs and moving toes    DUE TO BACK PROBLEMS   Prostatitis    Sacroiliac joint dysfunction 11/06/2014   Spinal stenosis 12/07/2014   Testicular dysfunction 11/04/2011   Overview:  Dr. Jacqlyn Larsen is his urologist.     Past Surgical History: Past Surgical History:  Procedure Laterality Date   BACK SURGERY  04/14/2013    L2-S1   Cancer of right ear     3/17   CATARACT EXTRACTION Left 06/10/1987   CERVICAL SPINE SURGERY      X2 FUSION C4-7   CHOLECYSTECTOMY  07/10/2013   COLONOSCOPY WITH PROPOFOL N/A 12/18/2014   Procedure: COLONOSCOPY WITH PROPOFOL;  Surgeon: Lucilla Lame, MD;  Location: [place];  Service: Endoscopy;  Laterality: N/A;   COLONOSCOPY WITH PROPOFOL N/A 09/23/2021   Procedure: COLONOSCOPY WITH PROPOFOL;  Surgeon: Lesly Rubenstein, MD;  Location: [facility];  Service: Endoscopy;  Laterality: N/A;   CYSTOSCOPY WITH INSERTION OF UROLIFT N/A 04/02/2021   Procedure: CYSTOSCOPY WITH INSERTION OF UROLIFT;  Surgeon: Abbie Sons, MD;  Location: [facility];  Service: Urology;  Laterality: N/A;   ELBOW ARTHROSCOPY Right 02/08/2012   ELBOW SURGERY Left    ESOPHAGOGASTRODUODENOSCOPY (EGD) WITH PROPOFOL N/A 12/18/2014   Procedure: ESOPHAGOGASTRODUODENOSCOPY (EGD) WITH PROPOFOL with dialtion;  Surgeon: Lucilla Lame, MD;  Location: [place];  Service: Endoscopy;  Laterality: N/A;   ESOPHAGOGASTRODUODENOSCOPY (EGD) WITH PROPOFOL N/A 09/23/2021   Procedure: ESOPHAGOGASTRODUODENOSCOPY (EGD) WITH PROPOFOL;  Surgeon: Lesly Rubenstein, MD;  Location: [facility];  Service: Endoscopy;  Laterality: N/A;   FACIAL RECONSTRUCTION SURGERY  06/09/1968   BOTTLE CUT NOSE AND EYE, MULTIPLE SURGERIES   FINGER SURGERY Left 06/09/2013   THUMB   FINGER SURGERY Right 06/09/1964   MIDDLE   FOOT SURGERY Right [id number]   FUNDIPLICATION  [id number]   HERNIA REPAIR Left 1981/1957   RIGHT SIDE IN 1957   JOINT REPLACEMENT Left 2021   knee   KNEE ARTHROPLASTY Left 02/10/2020   Procedure: COMPUTER ASSISTED TOTAL KNEE ARTHROPLASTY - RNFA;  Surgeon: Dereck Leep, MD;  Location: [facility];  Service: Orthopedics;  Laterality: Left;   KNEE ARTHROSCOPY Right 08/14/2021   Procedure: ARTHROSCOPY KNEE, medial menisectomy and chondroplasty, loose bodies removal;  Surgeon: Dereck Leep, MD;  Location:  [facility];  Service: Orthopedics;  Laterality: Right;   MASS EXCISION Left 02/16/2015   Procedure: LEFT BURSA ELBOW EXCISION;  Surgeon: Leanor Kail, MD;  Location: [place];  Service: Orthopedics;  Laterality: Left;   NISSEN FUNDOPLICATION  [id number]   NOSE SURGERY  12/01/2002   SEPTUM RECONSTRUCTION   POLYPECTOMY  12/18/2014   Procedure: POLYPECTOMY;  Surgeon: Lucilla Lame, MD;  Location: [place];  Service: Endoscopy;;   SEPTOPLASTY  06/09/1997   SHOULDER SURGERY Right 06/09/1981   SHOULDER SURGERY Left 06/09/1977   WITH ELBOW (TENDON RELEASE)   SKIN SURGERY  06/09/2002   SKIN CANCER NOSE    Allergies: Allergies as of 07/30/2022 - Review Complete 07/28/2022  Allergen Reaction Noted   Gabapentin Other (See Comments) 03/27/2022   Lamotrigine Other (See Comments) 03/27/2022    Medications: Outpatient Encounter Medications as of 07/30/2022  Medication Sig   amLODipine (NORVASC) 10 MG tablet Take 10 mg by mouth every morning.   benzonatate (TESSALON) 100 MG  capsule Take 1 capsule (100 mg total) by mouth every 8 (eight) hours.   Brimonidine Tartrate (LUMIFY) 0.025 % SOLN Place 1 drop into both eyes 2 (two) times daily as needed (redness).   celecoxib (CELEBREX) 200 MG capsule Take 200 mg by mouth 2 (two) times daily.   Cholecalciferol 125 MCG (5000 UT) TABS Take 5,000 Units by mouth daily.   clonazePAM (KLONOPIN) 0.5 MG tablet Take 0.5-1 mg by mouth 2 (two) times daily as needed for anxiety.   cyclobenzaprine (FLEXERIL) 10 MG tablet Take 10 mg by mouth 3 (three) times daily as needed for muscle spasms.   DULoxetine (CYMBALTA) 60 MG capsule 60 mg every morning.   furosemide (LASIX) 40 MG tablet Take 40 mg by mouth daily as needed for fluid.    Multiple Vitamin (MULTIVITAMIN WITH MINERALS) TABS tablet Take 1 tablet by mouth daily.   NEEDLE, DISP, 23 G 23G X 1" MISC Use 1 Units every 10 (ten) days.   sodium fluoride (FLUORISHIELD) 1.1 % GEL dental gel Place 1  application onto teeth daily.   spironolactone (ALDACTONE) 25 MG tablet Take 25 mg by mouth daily. AM   testosterone cypionate (DEPOTESTOSTERONE CYPIONATE) 200 MG/ML injection ADMINISTER 0.5 ML IN THE MUSCLE 1 TIME A WEEK   traZODone (DESYREL) 50 MG tablet Take 50 mg by mouth at bedtime as needed for sleep.   vitamin B-12 (CYANOCOBALAMIN) 1000 MCG tablet Take 2,000 mcg by mouth daily.   [DISCONTINUED] azithromycin (ZITHROMAX) 250 MG tablet Take 1 tablet (250 mg total) by mouth daily. Take first 2 tablets together, then 1 every day until finished.   [DISCONTINUED] promethazine-dextromethorphan (PROMETHAZINE-DM) 6.25-15 MG/5ML syrup Take 5 mLs by mouth 4 (four) times daily as needed.   No facility-administered encounter medications on file as of 07/30/2022.    Social History: Social History   Tobacco Use   Smoking status: Never   Smokeless tobacco: Never  Vaping Use   Vaping Use: Never used  Substance Use Topics   Alcohol use: Yes    Alcohol/week: 1.0 standard drink of alcohol    Types: 1 Glasses of wine per week    Comment: 1-2 drinks every night   Drug use: No    Family Medical History: Family History  Problem Relation Age of Onset   Arthritis Mother    Asthma Mother    Cancer Mother    Heart disease Father     Physical Examination: There were no vitals filed for this visit.  General: Patient is well developed, well nourished, calm, collected, and in no apparent distress. Attention to examination is appropriate.  Respiratory: Patient is breathing without any difficulty.   NEUROLOGICAL:     Awake, alert, oriented to person, place, and time.  Speech is clear and fluent. Fund of knowledge is appropriate.   Cranial Nerves: Pupils equal round and reactive to light.  Facial tone is symmetric.    Limited ROM of lumbar spine with pain Mild diffuse posterior lumbar tenderness.   Well healed lumbar incision.   No abnormal lesions on exposed skin.   Strength: Side Biceps  Triceps Deltoid Interossei Grip Wrist Ext. Wrist Flex.  R 5 5 5 5 5 5 5  $ L 5 5 5 5 5 5 5   $ Side Iliopsoas Quads Hamstring PF DF EHL  R 5 5 5 5 5 5  $ L 5 5 5 5 5 5   $ Reflexes are 2+ and symmetric at the biceps, triceps, brachioradialis, patella and achilles.   Hoffman's is  absent.  Clonus is not present.   Bilateral upper and lower extremity sensation is intact to light touch, but diminished in both legs from knees down.   Gait is normal.     Medical Decision Making  Imaging: No updated lumbar imaging.   Assessment and Plan: [name] is a pleasant 72 y.o. male has constant LBP with intermittent spasms. He has intermittent right lateral thigh pain and left posterior thigh pain- generally this is with bending or twisting. As above, he has known neuropathy in his arms/legs with numbness, tingling, and weakness.   History of 2 spine surgeries, last was L3-S1 fusion in 2019 with Dr. Loreen Freud.   No recent lumbar imaging is available.   Treatment options discussed with patient and following plan made:   - MRI of lumbar spine to further evaluate lumbar radiculopathy. No improvement with time or medications (celebrex).  - Lumbar xrays ordered to evaluate fusion.  - Discussed PT for lumbar spine. This has made him worse in the past. Will hold off.  - Will schedule phone visit to review above imaging results once I have them back.   Of note, he has seen pain management and Dr. Alba Destine in the past. I think referral was done for pain management recently, but he wanted to see [place] first.   I spent a total of 35 minutes in face-to-face and non-face-to-face activities related to this patient's care today including review of outside records, review of imaging, review of symptoms, physical exam, discussion of differential diagnosis, discussion of treatment options, and documentation.   Thank you for involving me in the care of this patient.   Geronimo Boot PA-C Dept. of Neurosurgery

## 2022-07-28 ENCOUNTER — Ambulatory Visit (INDEPENDENT_AMBULATORY_CARE_PROVIDER_SITE_OTHER): Payer: HMO | Admitting: Urology

## 2022-07-28 ENCOUNTER — Encounter: Payer: Self-pay | Admitting: Urology

## 2022-07-28 ENCOUNTER — Encounter: Payer: Self-pay | Admitting: Internal Medicine

## 2022-07-28 VITALS — BP 132/80 | HR 72 | Ht 69.0 in | Wt 220.0 lb

## 2022-07-28 DIAGNOSIS — N401 Enlarged prostate with lower urinary tract symptoms: Secondary | ICD-10-CM | POA: Diagnosis not present

## 2022-07-28 DIAGNOSIS — E291 Testicular hypofunction: Secondary | ICD-10-CM

## 2022-07-28 LAB — BLADDER SCAN AMB NON-IMAGING: Scan Result: 31

## 2022-07-28 NOTE — Progress Notes (Signed)
07/28/2022 11:29 AM   [name] 07-Jan-1951 [id number]  Referring provider: Latanya Maudlin, NP [address] [place],  [place] [id number]  Chief Complaint  Patient presents with   Benign Prostatic Hypertrophy    Urologic history: 1.  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  BPH with lower urinary tract symptoms             -Tamsulosin 0.8 mg daily  - UroLift 03/2021  HPI: 72 y.o. male presents for follow-up visit.  Status post UroLift 04/02/2021 Preop IPSS 22/35 Preop PVR 167 mL Symptoms have slightly worsened since his last visit and he restarted tamsulosin.  He states they are still much better than pre-UroLift.  IPSS today 10/35 with most bothersome symptoms being frequency and urgency Bladder scan PVR 31 mL Continues on TRT.  He is due for an injection today T level 01/23/2022 676 PSA 05/2022- 1.05   PMH: Past Medical History:  Diagnosis Date   Actinic keratosis    Anxiety    Arthritis    Back pain    MULTIPLE BACK SURGERIES   Basal cell carcinoma 10/09/2016   L sup med scapula    Basal cell carcinoma 07/11/2019   L lat forehead above lat brow    Basal cell carcinoma 08/29/2020   R prox medial pretibial, EDC   Basal cell carcinoma 08/29/2020   R posterior ear, EDC   BCC (basal cell carcinoma of skin) 12/27/2020   R epigastric - ED&C   Benign essential HTN 12/07/2014   Last Assessment & Plan:  Relevant Hx: Course: Daily Update: Today's Plan: CONTROLLED ON MEDS   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/31/2013   Bilateral sciatica    Chronic back pain    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   Cirrhosis (HCC)    DDD (degenerative disc disease), cervical 11/06/2014   DDD (degenerative disc disease), lumbar 11/06/2014   Depression    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   Overview:  severe in feet     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 Jacqlyn Larsen is his urologist.     Surgical History: Past Surgical History:  Procedure Laterality Date   BACK SURGERY  04/14/2013   L2-S1   Cancer of right ear     3/17   CATARACT EXTRACTION Left 06/10/1987   CERVICAL SPINE SURGERY      X2 FUSION C4-7   CHOLECYSTECTOMY  07/10/2013   COLONOSCOPY WITH PROPOFOL N/A 12/18/2014   Procedure: COLONOSCOPY WITH PROPOFOL;  Surgeon: Lucilla Lame, MD;  Location: [place];  Service: Endoscopy;  Laterality: N/A;   COLONOSCOPY WITH PROPOFOL N/A 09/23/2021   Procedure: COLONOSCOPY WITH PROPOFOL;  Surgeon: Lesly Rubenstein,  MD;  Location: [facility];  Service: Endoscopy;  Laterality: N/A;   CYSTOSCOPY WITH INSERTION OF UROLIFT N/A 04/02/2021   Procedure: CYSTOSCOPY WITH INSERTION OF UROLIFT;  Surgeon: Abbie Sons, MD;  Location: [facility];  Service: Urology;  Laterality: N/A;   ELBOW ARTHROSCOPY Right 02/08/2012   ELBOW SURGERY Left    ESOPHAGOGASTRODUODENOSCOPY (EGD) WITH PROPOFOL N/A 12/18/2014   Procedure: ESOPHAGOGASTRODUODENOSCOPY (EGD) WITH PROPOFOL with dialtion;  Surgeon: Lucilla Lame, MD;  Location: [place];  Service: Endoscopy;  Laterality: N/A;   ESOPHAGOGASTRODUODENOSCOPY (EGD) WITH PROPOFOL N/A 09/23/2021   Procedure: ESOPHAGOGASTRODUODENOSCOPY (EGD) WITH PROPOFOL;  Surgeon: Lesly Rubenstein, MD;  Location: [facility];  Service: Endoscopy;   Laterality: N/A;   FACIAL RECONSTRUCTION SURGERY  06/09/1968   BOTTLE CUT NOSE AND EYE, MULTIPLE SURGERIES   FINGER SURGERY Left 06/09/2013   THUMB   FINGER SURGERY Right 06/09/1964   MIDDLE   FOOT SURGERY Right [id number]   FUNDIPLICATION  [id number]   HERNIA REPAIR Left 1981/1957   RIGHT SIDE IN 1957   JOINT REPLACEMENT Left 2021   knee   KNEE ARTHROPLASTY Left 02/10/2020   Procedure: COMPUTER ASSISTED TOTAL KNEE ARTHROPLASTY - RNFA;  Surgeon: Dereck Leep, MD;  Location: [facility];  Service: Orthopedics;  Laterality: Left;   KNEE ARTHROSCOPY Right 08/14/2021   Procedure: ARTHROSCOPY KNEE, medial menisectomy and chondroplasty, loose bodies removal;  Surgeon: Dereck Leep, MD;  Location: [facility];  Service: Orthopedics;  Laterality: Right;   MASS EXCISION Left 02/16/2015   Procedure: LEFT BURSA ELBOW EXCISION;  Surgeon: Leanor Kail, MD;  Location: [place];  Service: Orthopedics;  Laterality: Left;   NISSEN FUNDOPLICATION  [id number]   NOSE SURGERY  12/01/2002   SEPTUM RECONSTRUCTION   POLYPECTOMY  12/18/2014   Procedure: POLYPECTOMY;  Surgeon: Lucilla Lame, MD;  Location: [place];  Service: Endoscopy;;   SEPTOPLASTY  06/09/1997   SHOULDER SURGERY Right 06/09/1981   SHOULDER SURGERY Left 06/09/1977   WITH ELBOW (TENDON RELEASE)   SKIN SURGERY  06/09/2002   SKIN CANCER NOSE    Home Medications:  Allergies as of 07/28/2022       Reactions   Gabapentin Other (See Comments)   Severe shakiness   Lamotrigine Other (See Comments)   falls        Medication List        Accurate as of July 28, 2022 11:29 AM. If you have any questions, ask your nurse or doctor.          STOP taking these medications    azithromycin 250 MG tablet Commonly known as: ZITHROMAX Stopped by: Abbie Sons, MD   promethazine-dextromethorphan 6.25-15 MG/5ML syrup Commonly known as: PROMETHAZINE-DM Stopped by: Abbie Sons, MD       TAKE these  medications    amLODipine 10 MG tablet Commonly known as: NORVASC Take 10 mg by mouth every morning.   benzonatate 100 MG capsule Commonly known as: TESSALON Take 1 capsule (100 mg total) by mouth every 8 (eight) hours.   celecoxib 200 MG capsule Commonly known as: CELEBREX Take 200 mg by mouth 2 (two) times daily.   Cholecalciferol 125 MCG (5000 UT) Tabs Take 5,000 Units by mouth daily.   clonazePAM 0.5 MG tablet Commonly known as: KLONOPIN Take 0.5-1 mg by mouth 2 (two) times daily as needed for anxiety.   cyanocobalamin 1000 MCG tablet Commonly known as: VITAMIN B12 Take 2,000 mcg by mouth daily.   cyclobenzaprine 10 MG tablet Commonly  known as: FLEXERIL Take 10 mg by mouth 3 (three) times daily as needed for muscle spasms.   DULoxetine 60 MG capsule Commonly known as: CYMBALTA 60 mg every morning.   furosemide 40 MG tablet Commonly known as: LASIX Take 40 mg by mouth daily as needed for fluid.   Lumify 0.025 % Soln Generic drug: Brimonidine Tartrate Place 1 drop into both eyes 2 (two) times daily as needed (redness).   multivitamin with minerals Tabs tablet Take 1 tablet by mouth daily.   NEEDLE (DISP) 23 G 23G X 1" Misc Use 1 Units every 10 (ten) days.   sodium fluoride 1.1 % Gel dental gel Commonly known as: FLUORISHIELD Place 1 application onto teeth daily.   spironolactone 25 MG tablet Commonly known as: ALDACTONE Take 25 mg by mouth daily. AM   testosterone cypionate 200 MG/ML injection Commonly known as: DEPOTESTOSTERONE CYPIONATE ADMINISTER 0.5 ML IN THE MUSCLE 1 TIME A WEEK   traZODone 50 MG tablet Commonly known as: DESYREL Take 50 mg by mouth at bedtime as needed for sleep.        Allergies:  Allergies  Allergen Reactions   Gabapentin Other (See Comments)    Severe shakiness   Lamotrigine Other (See Comments)    falls    Family History: Family History  Problem Relation Age of Onset   Arthritis Mother    Asthma Mother     Cancer Mother    Heart disease Father     Social History:  reports that he has never smoked. He has never used smokeless tobacco. He reports current alcohol use of about 1.0 standard drink of alcohol per week. He reports that he does not use drugs.   Physical Exam: BP 132/80   Pulse 72   Ht 5' 9"$  (1.753 m)   Wt 220 lb (99.8 kg)   BMI 32.49 kg/m   Constitutional:  Alert and oriented, No acute distress. HEENT: [place] AT, moist mucus membranes.  Trachea midline, no masses. Cardiovascular: No clubbing, cyanosis, or edema. Respiratory: Normal respiratory effort, no increased work of breathing. Psychiatric: Normal mood and affect.   Assessment & Plan:    1. Benign prostatic hyperplasia with lower urinary tract symptoms, symptom details unspecified Slight worsening of LUTS with storage related symptoms We discussed starting a beta 3 agonist however currently states his symptoms are not bothersome enough that he desires additional medication/treatment Continue tamsulosin Follow-up 1 year with PVR  2.  Hypogonadism Testosterone level drawn today Lab visit 6 months testosterone, PSA Office visit 1 year Testosterone refilled   Abbie Sons, MD  [facility] [address] [place], [place] [address] [phone]

## 2022-07-29 LAB — ESTRADIOL: Estradiol: 37 pg/mL (ref 7.6–42.6)

## 2022-07-29 LAB — TESTOSTERONE: Testosterone: 416 ng/dL (ref 264–916)

## 2022-07-30 ENCOUNTER — Encounter: Payer: Self-pay | Admitting: Orthopedic Surgery

## 2022-07-30 ENCOUNTER — Encounter: Payer: Self-pay | Admitting: Internal Medicine

## 2022-07-30 ENCOUNTER — Ambulatory Visit (INDEPENDENT_AMBULATORY_CARE_PROVIDER_SITE_OTHER): Payer: HMO | Admitting: Orthopedic Surgery

## 2022-07-30 ENCOUNTER — Ambulatory Visit: Payer: PRIVATE HEALTH INSURANCE | Admitting: Pain Medicine

## 2022-07-30 VITALS — BP 140/88 | Ht 69.0 in | Wt 224.2 lb

## 2022-07-30 DIAGNOSIS — G8929 Other chronic pain: Secondary | ICD-10-CM | POA: Diagnosis not present

## 2022-07-30 DIAGNOSIS — M5416 Radiculopathy, lumbar region: Secondary | ICD-10-CM | POA: Diagnosis not present

## 2022-07-30 DIAGNOSIS — M5442 Lumbago with sciatica, left side: Secondary | ICD-10-CM | POA: Diagnosis not present

## 2022-07-30 DIAGNOSIS — Z981 Arthrodesis status: Secondary | ICD-10-CM

## 2022-07-30 DIAGNOSIS — M5441 Lumbago with sciatica, right side: Secondary | ICD-10-CM

## 2022-07-30 NOTE — Patient Instructions (Signed)
It was so nice to see you today. Thank you so much for coming in.    I want to get some updated imaging of your back. I ordered an MRI and xrays of the lower back. We will get this approved through your insurance and they will call you to schedule the appointment and [facility] [place].   Once I get the results back, we will call you to schedule a phone visit with me to review the results.   Please do not hesitate to call if you have any questions or concerns. You can also message me in [place].   If you have not heard back about the MRI/xrays in the next week, please call the office so we can help you get them scheduled.   Geronimo Boot PA-C [phone]

## 2022-08-06 ENCOUNTER — Encounter: Payer: Self-pay | Admitting: Internal Medicine

## 2022-08-08 ENCOUNTER — Ambulatory Visit
Admission: RE | Admit: 2022-08-08 | Discharge: 2022-08-08 | Disposition: A | Discharge status: Home / Self Care | Payer: HMO | Source: Ambulatory Visit | Attending: Orthopedic Surgery | Admitting: Orthopedic Surgery

## 2022-08-08 DIAGNOSIS — M5416 Radiculopathy, lumbar region: Secondary | ICD-10-CM | POA: Diagnosis not present

## 2022-08-08 DIAGNOSIS — M4316 Spondylolisthesis, lumbar region: Secondary | ICD-10-CM | POA: Diagnosis not present

## 2022-08-08 DIAGNOSIS — M5442 Lumbago with sciatica, left side: Secondary | ICD-10-CM | POA: Diagnosis not present

## 2022-08-08 DIAGNOSIS — G8929 Other chronic pain: Secondary | ICD-10-CM | POA: Diagnosis not present

## 2022-08-08 DIAGNOSIS — M47816 Spondylosis without myelopathy or radiculopathy, lumbar region: Secondary | ICD-10-CM | POA: Diagnosis not present

## 2022-08-08 DIAGNOSIS — M5441 Lumbago with sciatica, right side: Secondary | ICD-10-CM | POA: Insufficient documentation

## 2022-08-08 DIAGNOSIS — M545 Low back pain, unspecified: Secondary | ICD-10-CM | POA: Diagnosis not present

## 2022-08-08 DIAGNOSIS — Z981 Arthrodesis status: Secondary | ICD-10-CM | POA: Diagnosis not present

## 2022-08-11 ENCOUNTER — Encounter: Payer: Self-pay | Admitting: Internal Medicine

## 2022-08-11 ENCOUNTER — Other Ambulatory Visit: Payer: Self-pay

## 2022-08-11 ENCOUNTER — Inpatient Hospital Stay: Payer: HMO

## 2022-08-11 ENCOUNTER — Inpatient Hospital Stay: Payer: HMO | Attending: Internal Medicine

## 2022-08-11 ENCOUNTER — Inpatient Hospital Stay (HOSPITAL_BASED_OUTPATIENT_CLINIC_OR_DEPARTMENT_OTHER): Payer: HMO | Admitting: Internal Medicine

## 2022-08-11 VITALS — BP 149/92 | HR 88 | Temp 97.6°F | Resp 17 | Wt 231.0 lb

## 2022-08-11 DIAGNOSIS — D751 Secondary polycythemia: Secondary | ICD-10-CM

## 2022-08-11 DIAGNOSIS — E291 Testicular hypofunction: Secondary | ICD-10-CM | POA: Diagnosis not present

## 2022-08-11 LAB — HEMOGLOBIN AND HEMATOCRIT (CANCER CENTER ONLY)
HCT: 50.2 % (ref 39.0–52.0)
Hemoglobin: 16.4 g/dL (ref 13.0–17.0)

## 2022-08-11 NOTE — Progress Notes (Signed)
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   # chronic back pain s/p surgery; hypogonadism [Dr.Stoiff]  INTERVAL HISTORY: Alone. Ambulating independently.    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  Chronic mild fatigue.  Denies any worsening shortness of breath or cough.  Chronic back pain not any worse.  No headaches.  Review of Systems  Constitutional:  Positive for malaise/fatigue. Negative for chills, diaphoresis, fever and weight loss.  HENT:  Negative for nosebleeds and sore throat.   Eyes:  Negative for double vision.  Respiratory:  Negative for cough, hemoptysis, sputum production, shortness of breath and wheezing.   Cardiovascular:  Negative for chest pain, palpitations, orthopnea and leg swelling.  Gastrointestinal:  Negative for abdominal pain, blood in stool, constipation, diarrhea, heartburn, melena, nausea and vomiting.  Genitourinary:  Negative for dysuria, frequency and urgency.  Musculoskeletal:  Positive for back pain and joint pain.  Skin:  Negative for itching.  Neurological:  Negative for dizziness, tingling, focal weakness, weakness and headaches.  Endo/Heme/Allergies:  Does not bruise/bleed easily.  Psychiatric/Behavioral:  Negative for depression. The patient is not nervous/anxious and does not have insomnia.      PAST MEDICAL HISTORY :  Past Medical History:  Diagnosis Date   Actinic keratosis    Anxiety    Arthritis    Back pain    MULTIPLE BACK SURGERIES   Basal cell carcinoma 10/09/2016   L sup med scapula    Basal cell carcinoma 07/11/2019   L lat forehead above lat brow    Basal cell carcinoma 08/29/2020   R prox medial pretibial, EDC    Basal cell carcinoma 08/29/2020   R posterior ear, [facility]C   BCC (basal cell carcinoma of skin) 12/27/2020   R epigastric - ED&C   Benign essential HTN 12/07/2014   Last Assessment & Plan:  Relevant Hx: Course: Daily Update: Today's Plan: CONTROLLED ON MEDS   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/31/2013   Bilateral sciatica    Chronic back pain    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   Cirrhosis (HCC)    DDD (degenerative disc disease), cervical 11/06/2014   DDD (degenerative disc disease), lumbar 11/06/2014   Depression    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 Jacqlyn Larsen is his urologist.     PAST SURGICAL HISTORY :   Past Surgical History:  Procedure Laterality Date   BACK SURGERY  04/14/2013   L2-S1   Cancer of right ear     3/17   CATARACT EXTRACTION Left  06/10/1987   CERVICAL SPINE SURGERY      X2 FUSION C4-7   CHOLECYSTECTOMY  07/10/2013   COLONOSCOPY WITH PROPOFOL N/A 12/18/2014   Procedure: COLONOSCOPY WITH PROPOFOL;  Surgeon: Lucilla Lame, MD;  Location: [place];  Service: Endoscopy;  Laterality: N/A;   COLONOSCOPY WITH PROPOFOL N/A 09/23/2021   Procedure: COLONOSCOPY WITH PROPOFOL;  Surgeon: Lesly Rubenstein, MD;  Location: [facility];  Service: Endoscopy;  Laterality: N/A;   CYSTOSCOPY WITH INSERTION OF UROLIFT N/A  04/02/2021   Procedure: CYSTOSCOPY WITH INSERTION OF UROLIFT;  Surgeon: Abbie Sons, MD;  Location: [facility];  Service: Urology;  Laterality: N/A;   ELBOW ARTHROSCOPY Right 02/08/2012   ELBOW SURGERY Left    ESOPHAGOGASTRODUODENOSCOPY (EGD) WITH PROPOFOL N/A 12/18/2014   Procedure: ESOPHAGOGASTRODUODENOSCOPY (EGD) WITH PROPOFOL with dialtion;  Surgeon: Lucilla Lame, MD;  Location: [place];  Service: Endoscopy;  Laterality: N/A;   ESOPHAGOGASTRODUODENOSCOPY (EGD) WITH PROPOFOL N/A 09/23/2021   Procedure: ESOPHAGOGASTRODUODENOSCOPY (EGD) WITH PROPOFOL;  Surgeon: Lesly Rubenstein, MD;  Location: [facility];  Service: Endoscopy;  Laterality: N/A;   FACIAL RECONSTRUCTION SURGERY  06/09/1968   BOTTLE CUT NOSE AND EYE, MULTIPLE SURGERIES   FINGER SURGERY Left 06/09/2013   THUMB   FINGER SURGERY Right 06/09/1964   MIDDLE   FOOT SURGERY Right [id number]   FUNDIPLICATION  [id number]   HERNIA REPAIR Left 1981/1957   RIGHT SIDE IN 1957   JOINT REPLACEMENT Left 2021   knee   KNEE ARTHROPLASTY Left 02/10/2020   Procedure: COMPUTER ASSISTED TOTAL KNEE ARTHROPLASTY - RNFA;  Surgeon: Dereck Leep, MD;  Location: [facility];  Service: Orthopedics;  Laterality: Left;   KNEE ARTHROSCOPY Right 08/14/2021   Procedure: ARTHROSCOPY KNEE, medial menisectomy and chondroplasty, loose bodies removal;  Surgeon: Dereck Leep, MD;  Location: [facility];  Service: Orthopedics;  Laterality: Right;   MASS EXCISION Left 02/16/2015   Procedure: LEFT BURSA ELBOW EXCISION;  Surgeon: Leanor Kail, MD;  Location: [place];  Service: Orthopedics;  Laterality: Left;   NISSEN FUNDOPLICATION  [id number]   NOSE SURGERY  12/01/2002   SEPTUM RECONSTRUCTION   POLYPECTOMY  12/18/2014   Procedure: POLYPECTOMY;  Surgeon: Lucilla Lame, MD;  Location: [place];  Service: Endoscopy;;   SEPTOPLASTY  06/09/1997   SHOULDER SURGERY Right 06/09/1981   SHOULDER SURGERY Left 06/09/1977   WITH  ELBOW (TENDON RELEASE)   SKIN SURGERY  06/09/2002   SKIN CANCER NOSE    FAMILY HISTORY :   Family History  Problem Relation Age of Onset   Arthritis Mother    Asthma Mother    Cancer Mother    Heart disease Father     SOCIAL HISTORY:   Social History   Tobacco Use   Smoking status: Never   Smokeless tobacco: Never  Vaping Use   Vaping Use: Never used  Substance Use Topics   Alcohol use: Yes    Alcohol/week: 1.0 standard drink of alcohol    Types: 1 Glasses of wine per week    Comment: 1-2 drinks every night   Drug use: No    ALLERGIES:  is allergic to gabapentin and lamotrigine.  MEDICATIONS:  Current Outpatient Medications  Medication Sig Dispense Refill   amLODipine (NORVASC) 10 MG tablet Take 10 mg by mouth every morning.     Brimonidine Tartrate (LUMIFY) 0.025 % SOLN Place 1 drop into both eyes 2 (two) times daily as needed (redness).     celecoxib (CELEBREX) 200 MG capsule Take  200 mg by mouth 2 (two) times daily.     clonazePAM (KLONOPIN) 0.5 MG tablet Take 0.5-1 mg by mouth 2 (two) times daily as needed for anxiety.     DULoxetine (CYMBALTA) 60 MG capsule 60 mg every morning.     furosemide (LASIX) 40 MG tablet Take 40 mg by mouth daily as needed for fluid.      Multiple Vitamin (MULTIVITAMIN WITH MINERALS) TABS tablet Take 1 tablet by mouth daily.     NEEDLE, DISP, 23 G 23G X 1" MISC Use 1 Units every 10 (ten) days.     sodium fluoride (FLUORISHIELD) 1.1 % GEL dental gel Place 1 application onto teeth daily.     spironolactone (ALDACTONE) 25 MG tablet Take 25 mg by mouth daily. AM     testosterone cypionate (DEPOTESTOSTERONE CYPIONATE) 200 MG/ML injection ADMINISTER 0.5 ML IN THE MUSCLE 1 TIME A WEEK 10 mL 0   traZODone (DESYREL) 50 MG tablet Take 50 mg by mouth at bedtime as needed for sleep.     vitamin B-12 (CYANOCOBALAMIN) 1000 MCG tablet Take 2,000 mcg by mouth daily.     No current facility-administered medications for this visit.    PHYSICAL  EXAMINATION:   BP (!) 149/92 (BP Location: Right Arm, Patient Position: Sitting)   Pulse 88   Temp 97.6 F (36.4 C) (Tympanic)   Resp 17   Wt 231 lb (104.8 kg)   SpO2 96%   BMI 34.11 kg/m   Filed Weights   08/11/22 1451  Weight: 231 lb (104.8 kg)     Physical Exam HENT:     Head: Normocephalic and atraumatic.     Mouth/Throat:     Pharynx: No oropharyngeal exudate.  Eyes:     Pupils: Pupils are equal, round, and reactive to light.  Cardiovascular:     Rate and Rhythm: Normal rate and regular rhythm.  Pulmonary:     Effort: No respiratory distress.     Breath sounds: No wheezing.  Abdominal:     General: Bowel sounds are normal. There is no distension.     Palpations: Abdomen is soft. There is no mass.     Tenderness: There is no abdominal tenderness. There is no guarding or rebound.  Musculoskeletal:        General: No tenderness. Normal range of motion.     Cervical back: Normal range of motion and neck supple.  Skin:    General: Skin is warm.     Comments: Erythematous rash noted on the face/skin ointment for precancerous skin lesions.  Neurological:     Mental Status: He is alert and oriented to person, place, and time.  Psychiatric:        Mood and Affect: Affect normal.     LABORATORY DATA:  I have reviewed the data as listed    Component Value Date/Time   NA 138 08/13/2021 0946   NA 137 02/22/2012 0645   K 4.0 08/13/2021 0946   K 4.6 02/22/2012 0645   CL 104 08/13/2021 0946   CL 103 02/22/2012 0645   CO2 24 08/13/2021 0946   CO2 30 02/22/2012 0645   GLUCOSE 98 08/13/2021 0946   GLUCOSE 90 02/22/2012 0645   BUN 13 08/13/2021 0946   BUN 9 02/22/2012 0645   CREATININE 0.93 08/13/2021 0946   CREATININE 0.97 02/22/2012 0645   CALCIUM 9.0 08/13/2021 0946   CALCIUM 8.5 02/22/2012 0645   PROT 7.5 02/02/2020 1202   PROT 6.9 02/22/2012 0645  ALBUMIN 4.4 02/02/2020 1202   ALBUMIN 3.1 (L) 02/22/2012 0645   AST 38 02/02/2020 1202   AST 80 (H)  02/22/2012 0645   ALT 32 02/02/2020 1202   ALT 96 (H) 02/22/2012 0645   ALKPHOS 34 (L) 02/02/2020 1202   ALKPHOS 47 (L) 02/22/2012 0645   BILITOT 1.0 02/02/2020 1202   BILITOT 0.3 02/22/2012 0645   GFRNONAA >60 08/13/2021 0946   GFRNONAA >60 02/22/2012 0645   GFRAA >60 02/02/2020 1202   GFRAA >60 02/22/2012 0645    No results found for: "SPEP", "UPEP"  Lab Results  Component Value Date   WBC 4.8 02/02/2020   NEUTROABS 2.8 04/18/2016   HGB 16.4 08/11/2022   HCT 50.2 08/11/2022   MCV 94.5 02/02/2020   PLT 172 02/02/2020      Chemistry      Component Value Date/Time   NA 138 08/13/2021 0946   NA 137 02/22/2012 0645   K 4.0 08/13/2021 0946   K 4.6 02/22/2012 0645   CL 104 08/13/2021 0946   CL 103 02/22/2012 0645   CO2 24 08/13/2021 0946   CO2 30 02/22/2012 0645   BUN 13 08/13/2021 0946   BUN 9 02/22/2012 0645   CREATININE 0.93 08/13/2021 0946   CREATININE 0.97 02/22/2012 0645      Component Value Date/Time   CALCIUM 9.0 08/13/2021 0946   CALCIUM 8.5 02/22/2012 0645   ALKPHOS 34 (L) 02/02/2020 1202   ALKPHOS 47 (L) 02/22/2012 0645   AST 38 02/02/2020 1202   AST 80 (H) 02/22/2012 0645   ALT 32 02/02/2020 1202   ALT 96 (H) 02/22/2012 0645   BILITOT 1.0 02/02/2020 1202   BILITOT 0.3 02/22/2012 0645        ASSESSMENT & PLAN:   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   # Today the hematocrit is 50.4. Recommend phlebotomy if HCT > 50. Patient denies any improvement of his symptoms post phlebotomy. Proceed with phlebotomy today.   # Ortho: right knee pain/ Back pain-s/p surgery improved. stable  # Hypogonadism- on testosterone [Dr.Stoioff]-stable.  Recommend continuing testosterone given his history of hypogonadism.  # DISPOSITION: # proceed with Phlebotomy #  H&H q 3 M- x4/possible phlebotomy; # Follow up  In 28 month with MD- H&H-Dr.B     Cammie Sickle, MD 08/11/2022 3:22 PM

## 2022-08-11 NOTE — Progress Notes (Signed)
Telephone Visit- Progress Note: Referring Physician:  No referring provider defined for this encounter.  Primary Physician:  [name], NP  This visit was performed via telephone.  Patient location: home Provider location: office  I spent a total of 15 minutes non-face-to-face activities for this visit on the date of this encounter including review of current clinical condition and response to treatment.    Patient has given verbal consent to this telephone visits and we reviewed the limitations of a telephone visit. Patient wishes to proceed.    Chief Complaint:  review lumbar MRI scan  History of Present Illness: [name] is a 72 y.o. male has a history of sleep apnea, skin CA, hep C, GERD, gout, HTN, peripheral neuropathy.    He has history of lumbar laminectomy and facetectomy at [facility] in 2014 with Dr. [name]. Also with history of posterior cervical laminectomy and foraminotomy with Dr. [name]. He also had ACDFC3-C6 along with posterior cervical fusion C3-C6.   He subsequently underwent L3-S1 fusion in 2019 with Dr. [name]. He has seen Dr. [name] in the past.    History of chronic LBP x years with bilateral leg pain in his thighs. He has known knee OA in right knee and painful left TKA. He also has known neuropathy in both arms/legs.   He continues with constant LBP and intermittent left and right leg pain mostly into his thighs, sometimes into the calf. He notes intermittent spasms in his back, he has to sit down to rest. He can't walk more than a block or so because of his back pain.   Phone visit scheduled to review his lumbar MRI results.   Conservative measures:  Physical therapy: no recent PT for his back- has made him worse in the past.  Multimodal medical therapy including regular antiinflammatories: celebrex  Injections:  Bilateral S1 TF ESI on 04/19/21 [name] with no relief  Bilateral S1 TFESI 03/29/2021 [name] with no relief   Had  multiple injections in the past with pain management.    Past Surgery:  - lumbar laminectomy and facetectomy at [facility] in 2014 with Dr. [name]. Also with history of posterior cervical laminectomy and foraminotomy with Dr. [name]. He also had ACDFC3-C6 along with posterior cervical fusion C3-C6. - L3-S1 fusion in 2019 with Dr. [name].    [name] has no symptoms of cervical myelopathy. He has neuropathy in his hands and has some dexterity issues. Has some balance issues with his knees.    The symptoms are causing a significant impact on the patient's life.   Exam: No exam done as this was a telephone encounter.     Imaging: Lumbar MRI dated 08/08/22:  FINDINGS: Segmentation:  Standard.   Alignment: 3 mm retrolisthesis of L2 on L3. 3 mm meter retrolisthesis of L1 on L2. 2 mm retrolisthesis of T12 on L1. minimal grade 1 anterolisthesis of L4 on L5 and L5 on S1.   Vertebrae: No acute fracture, evidence of discitis, or aggressive bone lesion.   Conus medullaris and cauda equina: Conus extends to the T12-L1 level. Conus and cauda equina appear normal.   Paraspinal and other soft tissues: No acute paraspinal abnormality.   Other: Mild osteoarthritis of bilateral SI joints.   Disc levels:   Disc spaces: Posterior lumbar fusion from L3 through S1. Degenerative disease with disc height loss at T12-L1, L1-2, L2-3, L3-4, L4-5 and L5-S1.   T12-L1: Broad-based disc bulge with a left foraminal/lateral disc protrusion. Mild bilateral  facet arthropathy. Mild-moderate spinal stenosis. Moderate left foraminal stenosis. Mild right foraminal stenosis.   L1-L2: Broad-based disc bulge. Moderate bilateral facet arthropathy. Mild spinal stenosis. Bilateral lateral recess stenosis. Moderate bilateral foraminal stenosis.   L2-L3: Broad-based disc bulge. Prior laminectomy. Mild spinal stenosis. Bilateral lateral recess stenosis. Severe right and moderate left foraminal stenosis.    L3-L4: Broad-based disc bulge. Prior laminectomy. No foraminal or central canal stenosis.   L4-L5: No disc protrusion. Prior laminectomy. Moderate bilateral foraminal stenosis. No spinal stenosis.   L5-S1: Broad-based disc bulge. Prior laminectomy. Severe bilateral foraminal stenosis. No spinal stenosis.   IMPRESSION: 1. Diffuse lumbar spine spondylosis as described above. 2. At T12-L1 there is a broad-based disc bulge with a left foraminal/lateral disc protrusion. Mild bilateral facet arthropathy. Mild-moderate spinal stenosis. Moderate left foraminal stenosis. Mild right foraminal stenosis. 3. At L1-2 there is a broad-based disc bulge. Moderate bilateral facet arthropathy. Mild spinal stenosis. Bilateral lateral recess stenosis. Moderate bilateral foraminal stenosis. 4. At L2-3 there is a broad-based disc bulge. Prior laminectomy. Mild spinal stenosis. Bilateral lateral recess stenosis. Severe right and moderate left foraminal stenosis. 5. At L4-5 there is a broad-based disc bulge. Prior laminectomy. Moderate bilateral foraminal stenosis. 6. At L5-S1 there is a broad-based disc bulge. Prior laminectomy. Severe bilateral foraminal stenosis. 7. No acute osseous injury of the lumbar spine.     Electronically Signed   By: Elige Ko M.D.   On: 08/09/2022 07:23  I have personally reviewed the images and agree with the above interpretation.  Assessment and Plan: [name] is a pleasant 72 y.o. male has constant LBP and intermittent left and right leg pain mostly into his thighs, sometimes into the calf. He notes intermittent spasms in his back, he has to sit down to rest. He can't walk more than a block or so because of his back pain.   History of 2 spine surgeries, last was L3-S1 fusion in 2019 with Dr. [name].   Lumbar MRI shows diffuse spondylosis. He has multilevel central and foraminal stenosis. Leg pain may be from L2-L3.    Treatment options discussed with  patient and following plan made:    - He will get lumbar xrays done in [place].  - We again discussed PT. This has made him worse in the past. Will hold off.  - Discussed referral back to Dr. [name] for injections. He declines as they did not help previously.  - Will review xrays/MRI with Dr. Myer Haff and schedule phone visit for follow up. If he is not a surgical candidate then would recommend referral to pain management (already done by PCP?).   [name] PA-C Neurosurgery

## 2022-08-11 NOTE — Assessment & Plan Note (Addendum)
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   # Today the hematocrit is 50.4. Recommend phlebotomy if HCT > 50. Patient denies any improvement of his symptoms post phlebotomy. Proceed with phlebotomy today.   # Ortho: right knee pain/ Back pain-s/p surgery improved. stable  # Hypogonadism- on testosterone [Dr.Stoioff]-stable.  Recommend continuing testosterone given his history of hypogonadism.  # DISPOSITION: # proceed with Phlebotomy #  H&H q 3 M- x4/possible phlebotomy; # Follow up  In 84 month with MD- H&H-Dr.B

## 2022-08-12 ENCOUNTER — Encounter: Payer: Self-pay | Admitting: Orthopedic Surgery

## 2022-08-12 ENCOUNTER — Ambulatory Visit (INDEPENDENT_AMBULATORY_CARE_PROVIDER_SITE_OTHER): Payer: HMO | Admitting: Orthopedic Surgery

## 2022-08-12 DIAGNOSIS — M48061 Spinal stenosis, lumbar region without neurogenic claudication: Secondary | ICD-10-CM | POA: Diagnosis not present

## 2022-08-12 DIAGNOSIS — M4726 Other spondylosis with radiculopathy, lumbar region: Secondary | ICD-10-CM | POA: Diagnosis not present

## 2022-08-12 DIAGNOSIS — Z981 Arthrodesis status: Secondary | ICD-10-CM | POA: Diagnosis not present

## 2022-08-12 DIAGNOSIS — M47816 Spondylosis without myelopathy or radiculopathy, lumbar region: Secondary | ICD-10-CM

## 2022-08-12 DIAGNOSIS — M5416 Radiculopathy, lumbar region: Secondary | ICD-10-CM

## 2022-08-21 ENCOUNTER — Ambulatory Visit
Admission: RE | Admit: 2022-08-21 | Discharge: 2022-08-21 | Disposition: A | Discharge status: Home / Self Care | Payer: HMO | Attending: Orthopedic Surgery | Admitting: Orthopedic Surgery

## 2022-08-21 ENCOUNTER — Other Ambulatory Visit: Payer: Self-pay | Admitting: Orthopedic Surgery

## 2022-08-21 ENCOUNTER — Ambulatory Visit
Admission: RE | Admit: 2022-08-21 | Discharge: 2022-08-21 | Disposition: A | Discharge status: Home / Self Care | Payer: HMO | Source: Ambulatory Visit | Attending: Orthopedic Surgery | Admitting: Orthopedic Surgery

## 2022-08-21 DIAGNOSIS — Z981 Arthrodesis status: Secondary | ICD-10-CM

## 2022-08-21 DIAGNOSIS — M5441 Lumbago with sciatica, right side: Secondary | ICD-10-CM | POA: Insufficient documentation

## 2022-08-21 DIAGNOSIS — M5416 Radiculopathy, lumbar region: Secondary | ICD-10-CM | POA: Insufficient documentation

## 2022-08-21 DIAGNOSIS — G8929 Other chronic pain: Secondary | ICD-10-CM | POA: Diagnosis not present

## 2022-08-21 DIAGNOSIS — M5442 Lumbago with sciatica, left side: Secondary | ICD-10-CM | POA: Insufficient documentation

## 2022-08-21 DIAGNOSIS — M5440 Lumbago with sciatica, unspecified side: Secondary | ICD-10-CM | POA: Diagnosis not present

## 2022-08-21 DIAGNOSIS — M5136 Other intervertebral disc degeneration, lumbar region: Secondary | ICD-10-CM | POA: Diagnosis not present

## 2022-08-26 NOTE — Progress Notes (Unsigned)
Telephone Visit- Progress Note: Referring Physician:  No referring provider defined for this encounter.  Primary Physician:  Latanya Maudlin, NP  This visit was performed via telephone.  Patient location: home Provider location: office  I spent a total of 10 minutes non-face-to-face activities for this visit on the date of this encounter including review of current clinical condition and response to treatment.    Patient has given verbal consent to this telephone visits and we reviewed the limitations of a telephone visit. Patient wishes to proceed.    Chief Complaint:  review lumbar xrays  History of Present Illness: [name] is a 72 y.o. male has a history of sleep apnea, skin CA, hep C, GERD, gout, HTN, peripheral neuropathy.    He has history of lumbar laminectomy and facetectomy at [facility] in 2014 with Dr. Ellender Hose. Also with history of posterior cervical laminectomy and foraminotomy with Dr. Myrene Buddy. He also had ACDFC3-C6 along with posterior cervical fusion C3-C6.   He subsequently underwent L3-S1 fusion in 2019 with Dr. Loreen Freud. He has seen Dr. Alba Destine in the past.    History of chronic LBP x years with bilateral leg pain in his thighs. He has known knee OA in right knee and painful left TKA. He also has known neuropathy in both arms/legs.   No change in his symptoms. He has constant LBP and intermittent left and right leg pain mostly into his thighs, sometimes into the calf. He notes intermittent spasms in his back and he has to sit down to rest when he has these. He can't walk more than a block or so because of his back pain.   Phone visit scheduled to review his lumbar xray results.    Conservative measures:  Physical therapy: no recent PT for his back- has made him worse in the past.  Multimodal medical therapy including regular antiinflammatories: celebrex  Injections:  Bilateral S1 TF ESI on 04/19/21 Alba Destine with no relief  Bilateral S1 TFESI  03/29/2021 Alba Destine with no relief   Had multiple injections in the past with pain management.    Past Surgery:  - lumbar laminectomy and facetectomy at [facility] in 2014 with Dr. Ellender Hose. Also with history of posterior cervical laminectomy and foraminotomy with Dr. Myrene Buddy. He also had ACDFC3-C6 along with posterior cervical fusion C3-C6. - L3-S1 fusion in 2019 with Dr. Loreen Freud.   Exam: No exam done as this was a telephone encounter.     Imaging: Lumbar xrays dated 08/21/22:  FINDINGS: Interval L3-S1 posterior lumbar fusion with instrumentation with bilateral pedicle screws and posterior plates. Marked, progressive intervertebral disc space narrowing L2-3 and development of 10 mm fixed retrolisthesis in keeping with changes of advanced degenerative disc disease. Mild interval progression of degenerative disc disease at T12-L1. Flexion and extension radiographs demonstrate no abnormal angulatory or translatory motion.   IMPRESSION: 1. Interval L3-S1 posterior lumbar fusion with instrumentation. 2. Marked, progressive degenerative disc disease at L2-3 with development of 10 mm fixed retrolisthesis.     Electronically Signed   By: Fidela Salisbury M.D.   On: 08/25/2022 01:45  I have personally reviewed the images and agree with the above interpretation.  Assessment and Plan: Mr. Spenner has constant LBP and intermittent left and right leg pain mostly into his thighs, sometimes into the calf. He notes intermittent spasms in his back, he has to sit down to rest. He can't walk more than a block or so because of his back pain.   History of  2 spine surgeries, last was L3-S1 fusion in 2019 with Dr. Loreen Freud.   Lumbar MRI shows diffuse spondylosis. He has multilevel central and foraminal stenosis. He has severe DDD L2-L3 with fixed retrolisthesis. His imaging was reviewed with Dr. Izora Ribas prior to this visit.    Treatment options discussed with patient and following plan made:   -  Dr. Izora Ribas recommends SCS trial. He is not interested in this. He would like to discuss further surgery options, such as extending his fusion.  - PT for lumbar spine. Orders to [facility] in [place].  - He will f/u with Dr. Izora Ribas in 6-8 weeks to discuss possible lumbar surgery.   Geronimo Boot PA-C Neurosurgery

## 2022-08-27 ENCOUNTER — Encounter: Payer: Self-pay | Admitting: Orthopedic Surgery

## 2022-08-27 ENCOUNTER — Ambulatory Visit (INDEPENDENT_AMBULATORY_CARE_PROVIDER_SITE_OTHER): Payer: HMO | Admitting: Orthopedic Surgery

## 2022-08-27 DIAGNOSIS — M4726 Other spondylosis with radiculopathy, lumbar region: Secondary | ICD-10-CM | POA: Diagnosis not present

## 2022-08-27 DIAGNOSIS — M48061 Spinal stenosis, lumbar region without neurogenic claudication: Secondary | ICD-10-CM

## 2022-08-27 DIAGNOSIS — M4310 Spondylolisthesis, site unspecified: Secondary | ICD-10-CM

## 2022-08-27 DIAGNOSIS — M47816 Spondylosis without myelopathy or radiculopathy, lumbar region: Secondary | ICD-10-CM

## 2022-08-27 DIAGNOSIS — Z981 Arthrodesis status: Secondary | ICD-10-CM | POA: Diagnosis not present

## 2022-08-27 DIAGNOSIS — M5416 Radiculopathy, lumbar region: Secondary | ICD-10-CM

## 2022-09-17 DIAGNOSIS — G8929 Other chronic pain: Secondary | ICD-10-CM | POA: Diagnosis not present

## 2022-09-17 DIAGNOSIS — M5440 Lumbago with sciatica, unspecified side: Secondary | ICD-10-CM | POA: Diagnosis not present

## 2022-09-17 DIAGNOSIS — M6281 Muscle weakness (generalized): Secondary | ICD-10-CM | POA: Diagnosis not present

## 2022-09-26 DIAGNOSIS — R399 Unspecified symptoms and signs involving the genitourinary system: Secondary | ICD-10-CM | POA: Diagnosis not present

## 2022-10-02 DIAGNOSIS — Z9889 Other specified postprocedural states: Secondary | ICD-10-CM | POA: Diagnosis not present

## 2022-10-02 DIAGNOSIS — M1711 Unilateral primary osteoarthritis, right knee: Secondary | ICD-10-CM | POA: Diagnosis not present

## 2022-10-02 DIAGNOSIS — Z96652 Presence of left artificial knee joint: Secondary | ICD-10-CM | POA: Diagnosis not present

## 2022-10-12 NOTE — Discharge Instructions (Addendum)
Instructions after Total Knee Replacement   James P. Hooten, Jr., M.D.     Dept. of Orthopaedics & Sports Medicine  [facility]  [address]  [place], [place]  [address]  Phone: [phone]   Fax: [phone]    DIET: Drink plenty of non-alcoholic fluids. Resume your normal diet. Include foods high in fiber.  ACTIVITY:  You may use crutches or a walker with weight-bearing as tolerated, unless instructed otherwise. You may be weaned off of the walker or crutches by your Physical Therapist.  Do NOT place pillows under the knee. Anything placed under the knee could limit your ability to straighten the knee.   Continue doing gentle exercises. Exercising will reduce the pain and swelling, increase motion, and prevent muscle weakness.   Please continue to use the TED compression stockings for 6 weeks. You may remove the stockings at night, but should reapply them in the morning. Do not drive or operate any equipment until instructed.  WOUND CARE:  Continue to use the PolarCare or ice packs periodically to reduce pain and swelling. You may bathe or shower after the staples are removed at the first office visit following surgery.  MEDICATIONS: You may resume your regular medications. Please take the pain medication as prescribed on the medication. Do not take pain medication on an empty stomach. You have been given a prescription for a blood thinner (Lovenox or Coumadin). Please take the medication as instructed. (NOTE: After completing a 2 week course of Lovenox, take one Enteric-coated aspirin twice a day. This along with elevation will help reduce the possibility of phlebitis in your operated leg.) Do not drive or drink alcoholic beverages when taking pain medications.  CALL THE OFFICE FOR: Temperature above 101 degrees Excessive bleeding or drainage on the dressing. Excessive swelling, coldness, or paleness of the toes. Persistent nausea and vomiting.  FOLLOW-UP:   You should have an appointment to return to the office in 10-14 days after surgery. Arrangements have been made for continuation of Physical Therapy (either home therapy or outpatient therapy).     [facility] Department Directory         [email or web address]       [email or web address]          Cardiology  Appointments: [place] - [phone] [place] - [phone]  Endocrinology  Appointments: [place] - [phone] [place] - [phone]  Gastroenterology  Appointments: [place] - [phone] [place] - [phone]        General Surgery   Appointments: [place] - [phone]  Internal Medicine/Family Medicine  Appointments: [place] - [phone] [place] - [phone] [place] - [phone]  Metabolic and Weigh Loss Surgery  Appointments: [place] - [phone]        Neurology  Appointments: [place] - [phone] [place] - [phone]  Neurosurgery  Appointments: [place] - [phone]  Obstetrics & Gynecology  Appointments: [place] - [phone] [place] - [phone]        Pediatrics  Appointments: [place] - [phone] [place] - [phone]  Physiatry  Appointments: [place] -[phone]  Physical Therapy  Appointments: [place] - [phone] [place] - [phone]        Podiatry  Appointments: [place] - [phone] [place] - [phone]  Pulmonology  Appointments: [place] - [phone]  Rheumatology  Appointments: [place] - [phone]        [place] Location: [facility]  [address] [place], [place]  [address]  [place] Location: [facility] [address] [place], [place]  [address]  [place] Location: [facility] [address] [place], [place]  [address]    

## 2022-10-17 ENCOUNTER — Encounter
Admission: RE | Admit: 2022-10-17 | Discharge: 2022-10-17 | Disposition: A | Discharge status: Home / Self Care | Payer: HMO | Source: Ambulatory Visit | Attending: Orthopedic Surgery | Admitting: Orthopedic Surgery

## 2022-10-17 VITALS — BP 148/98 | HR 73 | Resp 14 | Ht 69.0 in | Wt 228.0 lb

## 2022-10-17 DIAGNOSIS — Z01810 Encounter for preprocedural cardiovascular examination: Secondary | ICD-10-CM | POA: Diagnosis not present

## 2022-10-17 DIAGNOSIS — D751 Secondary polycythemia: Secondary | ICD-10-CM | POA: Diagnosis not present

## 2022-10-17 DIAGNOSIS — M1711 Unilateral primary osteoarthritis, right knee: Secondary | ICD-10-CM

## 2022-10-17 DIAGNOSIS — D72819 Decreased white blood cell count, unspecified: Secondary | ICD-10-CM | POA: Diagnosis not present

## 2022-10-17 DIAGNOSIS — R339 Retention of urine, unspecified: Secondary | ICD-10-CM | POA: Diagnosis not present

## 2022-10-17 DIAGNOSIS — Z8619 Personal history of other infectious and parasitic diseases: Secondary | ICD-10-CM

## 2022-10-17 DIAGNOSIS — Z01818 Encounter for other preprocedural examination: Secondary | ICD-10-CM | POA: Diagnosis not present

## 2022-10-17 HISTORY — DX: Personal history of urinary calculi: Z87.442

## 2022-10-17 HISTORY — DX: Sleep apnea, unspecified: G47.30

## 2022-10-17 LAB — COMPREHENSIVE METABOLIC PANEL
ALT: 70 U/L — ABNORMAL HIGH (ref 0–44)
AST: 48 U/L — ABNORMAL HIGH (ref 15–41)
Albumin: 4.4 g/dL (ref 3.5–5.0)
Alkaline Phosphatase: 54 U/L (ref 38–126)
Anion gap: 10 (ref 5–15)
BUN: 15 mg/dL (ref 8–23)
CO2: 27 mmol/L (ref 22–32)
Calcium: 9.4 mg/dL (ref 8.9–10.3)
Chloride: 100 mmol/L (ref 98–111)
Creatinine, Ser: 0.81 mg/dL (ref 0.61–1.24)
GFR, Estimated: >60 mL/min (ref >60)
Glucose, Bld: 94 mg/dL (ref 70–99)
Potassium: 3.9 mmol/L (ref 3.5–5.1)
Sodium: 137 mmol/L (ref 135–145)
Total Bilirubin: 1.2 mg/dL (ref 0.3–1.2)
Total Protein: 7.7 g/dL (ref 6.5–8.1)

## 2022-10-17 LAB — C-REACTIVE PROTEIN: CRP: <0.5 mg/dL (ref <1.0)

## 2022-10-17 LAB — CBC
HCT: 54.5 % — ABNORMAL HIGH (ref 39.0–52.0)
Hemoglobin: 18.2 g/dL — ABNORMAL HIGH (ref 13.0–17.0)
MCH: 31.2 pg (ref 26.0–34.0)
MCHC: 33.4 g/dL (ref 30.0–36.0)
MCV: 93.3 fL (ref 80.0–100.0)
Platelets: 211 10*3/uL (ref 150–400)
RBC: 5.84 MIL/uL — ABNORMAL HIGH (ref 4.22–5.81)
RDW: 14.6 % (ref 11.5–15.5)
WBC: 3.7 10*3/uL — ABNORMAL LOW (ref 4.0–10.5)
nRBC: 0 % (ref 0.0–0.2)

## 2022-10-17 LAB — SEDIMENTATION RATE: Sed Rate: 1 mm/hr (ref 0–20)

## 2022-10-17 LAB — URINALYSIS, ROUTINE W REFLEX MICROSCOPIC
Bilirubin Urine: NEGATIVE
Glucose, UA: NEGATIVE mg/dL
Hgb urine dipstick: NEGATIVE
Ketones, ur: NEGATIVE mg/dL
Leukocytes,Ua: NEGATIVE
Nitrite: NEGATIVE
Protein, ur: NEGATIVE mg/dL
Specific Gravity, Urine: 1.005 (ref 1.005–1.030)
pH: 7 (ref 5.0–8.0)

## 2022-10-17 LAB — TYPE AND SCREEN
ABO/RH(D): A POS
Antibody Screen: NEGATIVE

## 2022-10-17 LAB — SURGICAL PCR SCREEN
MRSA, PCR: NEGATIVE
Staphylococcus aureus: NEGATIVE

## 2022-10-17 NOTE — Patient Instructions (Addendum)
Your procedure is scheduled on:10-24-22 Friday Report to the Registration Desk on the 1st floor of the [facility].Then proceed to the 2nd floor Surgery Desk To find out your arrival time, please call [phone] between 1PM - 3PM on:10-23-22 Thursday If your arrival time is 6:00 am, do not arrive before that time as the [facility] entrance doors do not open until 6:00 am.  REMEMBER: Instructions that are not followed completely may result in serious medical risk, up to and including death; or upon the discretion of your surgeon and anesthesiologist your surgery may need to be rescheduled.  Do not eat food after midnight the night before surgery.  No gum chewing or hard candies.  You may however, drink CLEAR liquids up to 2 hours before you are scheduled to arrive for your surgery. Do not drink anything within 2 hours of your scheduled arrival time.  Clear liquids include: - water  - apple juice without pulp - gatorade (not RED colors) - black coffee or tea (Do NOT add milk or creamers to the coffee or tea) Do NOT drink anything that is not on this list.  In addition, your doctor has ordered for you to drink the provided:  Ensure Pre-Surgery Clear Carbohydrate Drink  Drinking this carbohydrate drink up to two hours before surgery helps to reduce insulin resistance and improve patient outcomes. Please complete drinking 2 hours before scheduled arrival time.  One week prior to surgery: Stop Anti-inflammatories (NSAIDS) such as Advil, Aleve, Ibuprofen, Motrin, Naproxen, Naprosyn and Aspirin based products such as Excedrin, Goody's Powder, BC Powder.You may however, take Tylenol if needed for pain up until the day of surgery.  Stop ANY OVER THE COUNTER supplements/vitamins NOW (74-10-24) until after surgery (Vitamin B12, D3, Multivitamin )  TAKE ONLY THESE MEDICATIONS THE MORNING OF SURGERY WITH A SIP OF WATER: -amLODipine (NORVASC)  -DULoxetine (CYMBALTA)  -ARIPiprazole (ABILIFY)   -You may take clonazePAM (KLONOPIN) for anxiety if needed  No Alcohol for 24 hours before or after surgery.  No Smoking including e-cigarettes for 24 hours before surgery.  No chewable tobacco products for at least 6 hours before surgery.  No nicotine patches on the day of surgery.  Do not use any "recreational" drugs for at least a week (preferably 2 weeks) before your surgery.  Please be advised that the combination of cocaine and anesthesia may have negative outcomes, up to and including death. If you test positive for cocaine, your surgery will be cancelled.  On the morning of surgery brush your teeth with toothpaste and water, you may rinse your mouth with mouthwash if you wish. Do not swallow any toothpaste or mouthwash.  Use CHG Soap as directed on instruction sheet.  Do not wear jewelry, make-up, hairpins, clips or nail polish.  Do not wear lotions, powders, or perfumes.   Do not shave body hair from the neck down 48 hours before surgery.  Contact lenses, hearing aids and dentures may not be worn into surgery.  Do not bring valuables to the hospital. [facility] is not responsible for any missing/lost belongings or valuables.   Notify your doctor if there is any change in your medical condition (cold, fever, infection).  Wear comfortable clothing (specific to your surgery type) to the hospital.  After surgery, you can help prevent lung complications by doing breathing exercises.  Take deep breaths and cough every 1-2 hours. Your doctor may order a device called an Incentive Spirometer to help you take deep breaths. When coughing or  sneezing, hold a pillow firmly against your incision with both hands. This is called "splinting." Doing this helps protect your incision. It also decreases belly discomfort.  If you are being admitted to the hospital overnight, leave your suitcase in the car. After surgery it may be brought to your room.  In case of increased patient census,  it may be necessary for you, the patient, to continue your postoperative care in the Same Day Surgery department.  If you are being discharged the day of surgery, you will not be allowed to drive home. You will need a responsible individual to drive you home and stay with you for 24 hours after surgery.   If you are taking public transportation, you will need to have a responsible individual with you.  Please call the Pre-admissions Testing Dept. at [phone] if you have any questions about these instructions.  Surgery Visitation Policy:  Patients having surgery or a procedure may have two visitors.  Children under the age of 26 must have an adult with them who is not the patient.  Inpatient Visitation:    Visiting hours are 7 a.m. to 8 p.m. Up to four visitors are allowed at one time in a patient room. The visitors may rotate out with other people during the day.  One visitor age 74 or older may stay with the patient overnight and must be in the room by 8 p.m.  How to Use an Incentive Spirometer An incentive spirometer is a tool that measures how well you are filling your lungs with each breath. Learning to take long, deep breaths using this tool can help you keep your lungs clear and active. This may help to reverse or lessen your chance of developing breathing (pulmonary) problems, especially infection. You may be asked to use a spirometer: After a surgery. If you have a lung problem or a history of smoking. After a long period of time when you have been unable to move or be active. If the spirometer includes an indicator to show the highest number that you have reached, your health care provider or respiratory therapist will help you set a goal. Keep a log of your progress as told by your health care provider. What are the risks? Breathing too quickly may cause dizziness or cause you to pass out. Take your time so you do not get dizzy or light-headed. If you are in pain, you may  need to take pain medicine before doing incentive spirometry. It is harder to take a deep breath if you are having pain. How to use your incentive spirometer  Sit up on the edge of your bed or on a chair. Hold the incentive spirometer so that it is in an upright position. Before you use the spirometer, breathe out normally. Place the mouthpiece in your mouth. Make sure your lips are closed tightly around it. Breathe in slowly and as deeply as you can through your mouth, causing the piston or the ball to rise toward the top of the chamber. Hold your breath for 3-5 seconds, or for as long as possible. If the spirometer includes a coach indicator, use this to guide you in breathing. Slow down your breathing if the indicator goes above the marked areas. Remove the mouthpiece from your mouth and breathe out normally. The piston or ball will return to the bottom of the chamber. Rest for a few seconds, then repeat the steps 10 or more times. Take your time and take a few normal  breaths between deep breaths so that you do not get dizzy or light-headed. Do this every 1-2 hours when you are awake. If the spirometer includes a goal marker to show the highest number you have reached (best effort), use this as a goal to work toward during each repetition. After each set of 10 deep breaths, cough a few times. This will help to make sure that your lungs are clear. If you have an incision on your chest or abdomen from surgery, place a pillow or a rolled-up towel firmly against the incision when you cough. This can help to reduce pain while taking deep breaths and coughing. General tips When you are able to get out of bed: Walk around often. Continue to take deep breaths and cough in order to clear your lungs. Keep using the incentive spirometer until your health care provider says it is okay to stop using it. If you have been in the hospital, you may be told to keep using the spirometer at home. Contact a  health care provider if: You are having difficulty using the spirometer. You have trouble using the spirometer as often as instructed. Your pain medicine is not giving enough relief for you to use the spirometer as told. You have a fever. Get help right away if: You develop shortness of breath. You develop a cough with bloody mucus from the lungs. You have fluid or blood coming from an incision site after you cough. Summary An incentive spirometer is a tool that can help you learn to take long, deep breaths to keep your lungs clear and active. You may be asked to use a spirometer after a surgery, if you have a lung problem or a history of smoking, or if you have been inactive for a long period of time. Use your incentive spirometer as instructed every 1-2 hours while you are awake. If you have an incision on your chest or abdomen, place a pillow or a rolled-up towel firmly against your incision when you cough. This will help to reduce pain. Get help right away if you have shortness of breath, you cough up bloody mucus, or blood comes from your incision when you cough. This information is not intended to replace advice given to you by your health care provider. Make sure you discuss any questions you have with your health care provider. Document Revised: 08/15/2019 Document Reviewed: 08/15/2019 Elsevier Patient Education  2023 Elsevier Inc.  POLAR CARE INFORMATION  MassAdvertisement.it  How to use [facility]?  YouTube   ShippingScam.co.uk  OPERATING INSTRUCTIONS  Start the product With dry hands, connect the transformer to the electrical connection located on the top of the cooler. Next, plug the transformer into an appropriate electrical outlet. The unit will automatically start running at this point.  To stop the pump, disconnect electrical power.  Unplug to stop the product when not in use. Unplugging the Polar Care unit turns it  off. Always unplug immediately after use. Never leave it plugged in while unattended. Remove pad.    FIRST ADD WATER TO FILL LINE, THEN ICE---Replace ice when existing ice is almost melted  1 Discuss Treatment with your Licensed Health Care Practitioner and Use Only as Prescribed 2 Apply Insulation Barrier & Cold Therapy Pad 3 Check for Moisture 4 Inspect Skin Regularly  Tips and Trouble Shooting Usage Tips 1. Use cubed or chunked ice for optimal performance. 2. It is recommended to drain the Pad between uses. To drain the  pad, hold the Pad upright with the hose pointed toward the ground. Depress the black plunger and allow water to drain out. 3. You may disconnect the Pad from the unit without removing the pad from the affected area by depressing the silver tabs on the hose coupling and gently pulling the hoses apart. The Pad and unit will seal itself and will not leak. Note: Some dripping during release is normal. 4. DO NOT RUN PUMP WITHOUT WATER! The pump in this unit is designed to run with water. Running the unit without water will cause permanent damage to the pump. 5. Unplug unit before removing lid.  TROUBLESHOOTING GUIDE Pump not running, Water not flowing to the pad, Pad is not getting cold 1. Make sure the transformer is plugged into the wall outlet. 2. Confirm that the ice and water are filled to the indicated levels. 3. Make sure there are no kinks in the pad. 4. Gently pull on the blue tube to make sure the tube/pad junction is straight. 5. Remove the pad from the treatment site and ll it while the pad is lying at; then reapply. 6. Confirm that the pad couplings are securely attached to the unit. Listen for the double clicks (Figure 1) to confirm the pad couplings are securely attached.  Leaks    Note: Some condensation on the lines, controller, and pads is unavoidable, especially in warmer climates. 1. If using a [employer] Polar Care Cold Therapy unit with a detachable Cold  Therapy Pad, and a leak exists (other than condensation on the lines) disconnect the pad couplings. Make sure the silver tabs on the couplings are depressed before reconnecting the pad to the pump hose; then confirm both sides of the coupling are properly clicked in. 2. If the coupling continues to leak or a leak is detected in the pad itself, stop using it and call [employer] Customer Care at [phone].  Cleaning After use, empty and dry the unit with a soft cloth. Warm water and mild detergent may be used occasionally to clean the pump and tubes.  WARNING: The Polar Care Cube can be cold enough to cause serious injury, including full skin necrosis. Follow these Operating Instructions, and carefully read the Product Insert (see pouch on side of unit) and the Cold Therapy Pad Fitting Instructions (provided with each Cold Therapy Pad) prior to use.

## 2022-10-19 NOTE — H&P (Signed)
ORTHOPAEDIC HISTORY & PHYSICAL [name], [name]., MD - 10/02/2022 11:00 AM EDT Formatting of this note is different from the original. Images from the original note were not included. Chief Complaint: Chief Complaint Patient presents with Left Knee - Pain Right Knee - Pain  Reason for Visit: The patient is a 72 y.o. male who presents today for reevaluation of his right knee. He is 1 year status post right knee arthroscopy, partial medial meniscectomy, and chondroplasty of the medial and patellofemoral compartments. He was noted to have grade 3 changes of chondromalacia to the medial and patellofemoral compartments at that time. He does report some increased pain to the right knee as well as occasional "popping" sensation. The symptoms are worse with squatting or deep flexion of the knee. He is not using any ambulatory aids. The knee pain limits the patient's ability to ambulate long distances. The patient has not appreciated any significant improvement despite NSAIDs and activity modification. The patient states that the knee pain has progressed to the point that it is significantly interfering with his activities of daily living.  He does report some crepitance and discomfort to the left knee with arising from a seated position. He denies any left knee swelling, locking, or giving way. He also has chronic low back pain with some radiation into the buttocks and both legs. He has not appreciated any improvement from epidural steroid injections as per Dr. [name]. He has an upcoming appointment with Dr. Myer Haff (Neurosurgery), but he would like to address the right knee issues before considering any additional intervention for the lumbar spine.  Medications: Current Outpatient Medications Medication Sig Dispense Refill amLODIPine (NORVASC) 10 MG tablet Take 1 tablet (10 mg total) by mouth once daily 90 tablet 3 amoxicillin (AMOXIL) 500 MG capsule Take 2,000 mg by mouth once - 1 hour before  dental appt ARIPiprazole (ABILIFY) 2 MG tablet Take 1 tablet (2 mg total) by mouth once daily for 180 days 90 tablet 1 brimonidine (LUMIFY) 0.025 % Drop Apply 1 drop to eye once daily as needed celecoxib (CELEBREX) 200 MG capsule Take 1 capsule (200 mg total) by mouth 2 (two) times daily 180 capsule 1 ciprofloxacin HCl (CIPRO) 500 MG tablet Take 1 tablet (500 mg total) by mouth 2 (two) times daily for 15 days 30 tablet 0 clonazePAM (KLONOPIN) 0.5 MG tablet Take 1 tablet (0.5 mg total) by mouth 2 (two) times daily May take an extra tablet at night as needed for sleep. 60 tablet 0 DULoxetine (CYMBALTA) 60 MG DR capsule Take 1 capsule (60 mg total) by mouth once daily 90 capsule 3 fluticasone propionate (FLONASE) 50 mcg/actuation nasal spray Place 1 spray into both nostrils 2 (two) times daily as needed FUROsemide (LASIX) 40 MG tablet Take 1 tablet (40 mg total) by mouth once daily as needed 90 tablet 3 ipratropium (ATROVENT) 21 mcg (0.03 %) nasal spray Place 2 sprays into both nostrils 2 (two) times daily 30 mL 11 multivitamin tablet Take 1 tablet by mouth once daily PREVIDENT 5000 PLUS 1.1 % BRUSH TEETH WITH PEASIZED AMOUNT AT BEDTIME. SPIT ACCESS SEVERAL TIMES BUT DO NOT RINSE spironolactone (ALDACTONE) 25 MG tablet Take 1 tablet (25 mg total) by mouth once daily 90 tablet 1 syringe with needle 3 mL 23 x 1" Syrg USE AS DIRECTED FOR TESTOSTERONE INJECTION 100 Syringe 2 testosterone cypionate (DEPO-TESTOSTERONE) 200 mg/mL injection 0.58mL intramuscular once a week 10 mL 0 traZODone (DESYREL) 100 MG tablet Take 1 tablet (100 mg total) by mouth at  bedtime 90 tablet 3  No current facility-administered medications for this visit.  Allergies: Allergies Allergen Reactions Lamictal [Lamotrigine] Dizziness falls Gabapentin Other (See Comments) Severe shakiness  Past Medical History: Past Medical History: Diagnosis Date Apnea, sleep 12/07/2014 Back pain Benign neoplasm of cecum 09/29/2019 Cancer  (CMS/HHS-HCC) skin cancer Carpal tunnel syndrome Chronic hepatitis C without hepatic coma (CMS/HHS-HCC) 12/18/2019 Treated most frequently in 2016 with good results. Chronic hepatitis C without mention of hepatic coma (CMS/HHS-HCC) from transfusion; treated 2006 with interferon with relapse treated with Harvoni in 2016 Colonic polyp DDD (degenerative disc disease), cervical 11/06/2014 Depressive disorder, not elsewhere classified Encounter for blood transfusion GERD (gastroesophageal reflux disease) Gout, joint Hydronephrosis with urinary obstruction due to ureteral calculus 09/30/2016 Hypertension Nephrolithiasis 06/10/2017 Osteoarthrosis, unspecified whether generalized or localized, unspecified site Peripheral neuropathy severe in feet Reflux esophagitis Renal colic 09/30/2016 Testicular dysfunction Ventral hernia  Past Surgical History: Past Surgical History: Procedure Laterality Date RECONSTRUCTION OF NOSE N/A 1970 S/P surgical repair with skin grafting EYELID REPAIR W/ SKIN GRAFT Left 1970 RADIOFREQUENCY ABLATION SPINAL NERVES 2004 COLONOSCOPY 07/22/2010 PH Adenomatous Polyps: CBF 07/2015; Colon 12/18/2014 w/Dr. Servando Snare MD (dw) LAMINECTOMY POSTERIOR LUMBAR FACETECTOMY & FORAMINOTOMY W/DECOMP N/A 04/14/2013 Procedure: LAMINECTOMY POSTERIOR LUMBAR FACETECTOMY & FORAMINOTOMY W/DECOMP c-arm Mccullough retractors Microscope and instruments patient prone, wilson frame on regular table anspach - [id number] bit ; Surgeon: Ronalee Belts, MD; Location: [facility]; Service: Neurosurgery; Laterality: N/A; LAMINECTOMY POSTERIOR CERVICLE DECOMP W/FACETECTOMY & FORAMINOTOMY N/A 04/14/2013 Procedure: LAMINEC/FACETECT/FORAMIN,EACH ADDNL; Surgeon: Ronalee Belts, MD; Location: [facility]; Service: Neurosurgery; Laterality: N/A; MICROSURGERY N/A 04/14/2013 Procedure: MICROSURGERY; Surgeon: Ronalee Belts, MD; Location: [facility]; Service: Neurosurgery; Laterality:  N/A; COLONOSCOPY 12/18/2014 Dr. Servando Snare MD EGD 12/18/2014 Dr. Servando Snare MD OLECRANON BURSA EXCISION Left 02/16/2015 POSTERIOR LUMBAR SPINE FUSION ONE LEVEL LATERAL TRANSVERSE TECHNIQUE Bilateral 11/13/2017 Procedure: BILATERAL LUMBAR DECOMPRESSION, STABILIZATION SPINAL FUSION L3-4, L4-5, L5-S1. RIGHT ICBG - VIVIGEN ALLOGRAFT.; Surgeon: Marlane Mingle, MD; Location: [facility] OR; Service: Orthopedics; Laterality: Bilateral; INSTRUMENTATION POSTERIOR SPINE 3 TO 6 VERTEBRAL SEGMENTS Bilateral 11/13/2017 Procedure: POSTERIOR SEGMENTAL INSTRUMENTATION (EG, PEDICLE FIXATION, DUAL RODS WITH MULTIPLE HOOKS AND SUBLAMINAR WIRES); 3 TO 6 VERTEBRAL SEGMENTS; Surgeon: Marlane Mingle, MD; Location: [facility] OR; Service: Orthopedics; Laterality: Bilateral; LAMINECTOMY POSTERIOR LUMBAR FACETECTOMY & FORAMINOTOMY W/DECOMP Bilateral 11/13/2017 Procedure: LAMINECTOMY, FACETECTOMY AND FORAMINOTOMY (UNILATERAL OR BILATERAL WITH DECOMPRESSION OF SPINAL CORD, CAUDA EQUINA AND/OR NERVE ROOT(S), SINGLE VERTEBRAL SEGMENT; LUMBAR; Surgeon: Marlane Mingle, MD; Location: [facility] OR; Service: Orthopedics; Laterality: Bilateral; AUTOGRAFT MORSELIZED OBTAINED SEPARATE INCISION FOR SPINE SURGERY Right 11/13/2017 Procedure: AUTOGRAFT FOR SPINE SURGERY ONLY (INCLUDES HARVESTING THE GRAFT); MORSELIZED (THROUGH SEPARATE SKIN OR FASCIAL INCISION); Surgeon: Marlane Mingle, MD; Location: [facility] OR; Service: Orthopedics; Laterality: Right; LAMINECTOMY POSTERIOR CERVICLE DECOMP W/FACETECTOMY & FORAMINOTOMY Bilateral 11/13/2017 Procedure: LAMINECTOMY, FACETECTOMY AND FORAMINOTOMY, SINGLE VERTEBRAL SEGMENT; EACH ADDITIONAL SEGMENT, CERVICAL, THORACIC, OR LUMBAR; [address] x2; Surgeon: Marlane Mingle, MD; Location: [facility] OR; Service: Orthopedics; Laterality: Bilateral; Left total knee arthroplasty using computer-assisted navigation 02/10/2020 Dr Ernest Pine Right knee arthroscopy, partial medial meniscectomy, chondroplasty, and removal of  osteochondral loose bodies 08/14/2021 Dr Ernest Pine COLONOSCOPY 09/23/2021 H/O Adenomatous Polyps/No Repeat/CTL EGD 09/23/2021 Gastritis/No Repeat/CTL ANTERIOR FUSION CERVICAL SPINE x 2; 3/4, 4/5, 5/6 ARTHRODESIS POSTERIOR LUMBAR SPINE W/LAMINECTOMY/DISCECTOMY ARTHROSCOPY SHOULDER Bilateral BACK SURGERY CHOLECYSTECTOMY COLONOSCOPY 11/29/2004, 03/11/2004 Adenomatous Polyps EGD 03/17/2013, 07/22/2010, 03/11/2004, 04/10/2000 No repeat per RTE ELBOW ARTHROSCOPY Bilateral EXTRACTION CATARACT EXTRACAPSULAR W/INSERTION INTRAOCULAR PROSTHESIS Left FRACTURE SURGERY INGUINAL HERNIA REPAIR Bilateral LAPAROSCOPIC ESOPHAGOGASTRIC FUNDOPLASTY (NISSEN PROCEDURE) MOHS SURGERY nose POSTERIOR FUSION CERVICAL  SPINE x 2 (C3-C6) SEPTOPLASTY multiple  Social History: Social History  Socioeconomic History Marital status: Married Spouse name: Lyla Son Number of children: 2 Years of education: 12 Highest education level: High school graduate Occupational History Occupation: RetiredPsychologist, sport and exercise / EMT Tobacco Use Smoking status: Never Smokeless tobacco: Former Types: Chew Quit date: 06/10/1983 Vaping Use Vaping status: Never Used Substance and Sexual Activity Alcohol use: Yes Alcohol/week: 7.0 standard drinks of alcohol Types: 7 Standard drinks or equivalent per week Comment: Pt states moderate amount Drug use: No Sexual activity: Yes Partners: Female Social History Narrative Lives with spouse. 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  Family History: Family History Problem Relation Name Age of Onset Lung cancer Mother Coronary Artery Disease (Blocked arteries around heart) Father Myocardial Infarction (Heart attack) Father 75 Coronary Artery Disease (Blocked arteries around heart) Sister Depression Sister Gout Brother Anesthesia problems Neg Hx Malignant hyperthermia Neg Hx  Review of Systems: A comprehensive 14 point ROS was  performed, reviewed, and the pertinent orthopaedic findings are documented in the HPI.  Exam BP 122/84  Ht 175.3 cm (5\' 9" )  Wt (!) 102.6 kg (226 lb 3.2 oz)  BMI 33.40 kg/m  General: Well-developed, well-nourished male seen in no acute distress. Antalgic gait. Varus thrust to the right knee.  HEENT: Atraumatic, normocephalic. Pupils are equal and reactive to light. Extraocular motion is intact. Sclera are clear. Oropharynx is clear with moist mucosa.  Neck: Supple, nontender, and with good ROM. No thyromegaly, adenopathy, JVD, or carotid bruits.  Lungs: Clear to auscultation bilaterally.  Cardiovascular: Regular rate and rhythm. Normal S1, S2. No murmur . No appreciable gallops or rubs. Peripheral pulses are palpable. No lower extremity edema. Homan`s test is negative.  Abdomen: Soft, nontender, nondistended. Bowel sounds are present.  Extremities: Good strength, stability, and range of motion of the upper extremities. Good range of motion of the hips and ankles.  Right Knee: Soft tissue swelling: mild Effusion: minimal Erythema: none Crepitance: mild Tenderness: medial Alignment: relative varus Mediolateral laxity: medial pseudolaxity Posterior sag: negative Patellar tracking: Good tracking without evidence of subluxation or tilt Atrophy: No significant atrophy. Quadriceps tone was good. Range of motion: 0/0/121 degrees  Neurologic: Awake, alert, and oriented. Sensory function is intact to pinprick and light touch. Motor strength is judged to be 5/5. Motor coordination is within normal limits. No apparent clonus. No tremor.  X-rays: I reviewed the right knee radiographs that were performed at [facility] on 07/17/2022. There is narrowing of the medial cartilage space with associated varus alignment. Osteophyte formation is noted. Subchondral sclerosis is noted. Degenerative changes to the patellofemoral articulation are noted. No evidence of fracture or  dislocation.  Impression: Degenerative arthrosis of the right knee  Plan: The findings were discussed in detail with the patient. The patient was given informational material on total knee replacement. Conservative treatment options were reviewed with the patient. We discussed the risks and benefits of surgical intervention. The usual perioperative course was also discussed in detail. The patient expressed understanding of the risks and benefits of surgical intervention and would like to proceed with plans for right total knee arthroplasty.  I spent a total of 45 minutes in both face-to-face and non-face-to-face activities, excluding procedures performed, for this visit on the date of this encounter.  MEDICAL CLEARANCE: Per anesthesiology. ACTIVITY: As tolerated. WORK STATUS: Not applicable. THERAPY: Preoperative physical therapy evaluation. MEDICATIONS: Requested Prescriptions  No prescriptions requested or ordered in this encounter  FOLLOW-UP:  Return for preop History & Physical pending surgery date.  Vana Arif P. Angie Fava., M.D.  This note was generated in part with voice recognition software and I apologize for any typographical errors that were not detected and corrected.  Electronically signed by Shari Heritage., MD at 10/05/2022 3:13 PM EDT

## 2022-10-20 NOTE — Progress Notes (Signed)
Referring Physician:  [name], NP [address] [place],  [place] [address]  Primary Physician:  [name], NP  History of Present Illness: 10/20/2022 [name] is here today with a chief complaint of ***  low back pain that radiates into the bilateral legs?  Duration: *** Location: *** Quality: *** Severity: *** 10/10 Precipitating: aggravated by ***walking Modifying factors: made better by ***sitting Weakness: none Timing: ***constant Bowel/Bladder Dysfunction: none  Conservative measures:  Physical therapy: *** has participated in 2 visits at [facility] and was discharged.  Multimodal medical therapy including regular antiinflammatories: *** celebrex Injections: *** has received epidural steroid injections 04/19/21: Bilateral S1 TF ESI (no relief) 03/29/21: Bilateral S1 TF ESI (no relief)  Past Surgery: *** - lumbar laminectomy and facetectomy at [facility] in 2014 with Dr. [name]. Also with history of posterior cervical laminectomy and foraminotomy with Dr. [name]. He also had ACDF C3-C6 along with posterior cervical fusion C3-C6. - L3-S1 fusion in 2019 with Dr. Chilton Si has ***no symptoms of cervical myelopathy.  The symptoms are causing a significant impact on the patient's life.   Progress Note from [name], [place] on 08/27/22:  History of Present Illness: [name] is a 72 y.o. male has a history of sleep apnea, skin CA, hep C, GERD, gout, HTN, peripheral neuropathy.    He has history of lumbar laminectomy and facetectomy at [facility] in 2014 with Dr. [name]. Also with history of posterior cervical laminectomy and foraminotomy with Dr. [name]. He also had ACDFC3-C6 along with posterior cervical fusion C3-C6.   He subsequently underwent L3-S1 fusion in 2019 with Dr. [name]. He has seen Dr. [name] in the past.    History of chronic LBP x years with bilateral leg pain in his thighs. He has known knee  OA in right knee and painful left TKA. He also has known neuropathy in both arms/legs.    No change in his symptoms. He has constant LBP and intermittent left and right leg pain mostly into his thighs, sometimes into the calf. He notes intermittent spasms in his back and he has to sit down to rest when he has these. He can't walk more than a block or so because of his back pain.    Phone visit scheduled to review his lumbar xray results.   I have utilized the care everywhere function in epic to review the outside records available from external health systems.  Review of Systems:  A 10 point review of systems is negative, except for the pertinent positives and negatives detailed in the HPI.  Past Medical History: Past Medical History:  Diagnosis Date   Actinic keratosis    Anxiety    Arthritis    Back pain    MULTIPLE BACK SURGERIES   Basal cell carcinoma 10/09/2016   L sup med scapula    Basal cell carcinoma 07/11/2019   L lat forehead above lat brow    Basal cell carcinoma 08/29/2020   R prox medial pretibial, [facility]   Basal cell carcinoma 08/29/2020   R posterior ear, [facility]   BCC (basal cell carcinoma of skin) 12/27/2020   R epigastric - ED&C   Benign essential HTN 12/07/2014   Last Assessment & Plan:  Relevant Hx: Course: Daily Update: Today's Plan: CONTROLLED ON MEDS   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/31/2013   Bilateral sciatica    Chronic back pain    Chronic inflammation of  tunica albuginea 03/31/2013   Cirrhosis (HCC)    DDD (degenerative disc disease), cervical 11/06/2014   DDD (degenerative disc disease), lumbar 11/06/2014   Depression    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   Overview:  severe in feet    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 [name] is his urologist.     Past Surgical History: Past Surgical History:  Procedure Laterality Date   BACK SURGERY  04/14/2013   L2-S1   Cancer of right ear     3/17   CATARACT EXTRACTION Left 06/10/1987   CERVICAL SPINE SURGERY      X2 FUSION C4-7   CHOLECYSTECTOMY  07/10/2013   COLONOSCOPY WITH PROPOFOL N/A 12/18/2014   Procedure: COLONOSCOPY WITH PROPOFOL;  Surgeon: [name], MD;  Location: [facility];  Service: Endoscopy;  Laterality: N/A;   COLONOSCOPY WITH PROPOFOL N/A 09/23/2021   Procedure: COLONOSCOPY WITH PROPOFOL;  Surgeon: [name], MD;  Location: [facility];  Service: Endoscopy;  Laterality: N/A;   CYSTOSCOPY WITH INSERTION OF UROLIFT N/A 04/02/2021   Procedure: CYSTOSCOPY WITH INSERTION OF UROLIFT;  Surgeon: [name], MD;  Location: [facility];  Service: Urology;  Laterality: N/A;   ELBOW ARTHROSCOPY Right 02/08/2012   ELBOW SURGERY Left    ESOPHAGOGASTRODUODENOSCOPY (EGD) WITH PROPOFOL N/A 12/18/2014   Procedure: ESOPHAGOGASTRODUODENOSCOPY (EGD) WITH PROPOFOL with dialtion;  Surgeon: [name], MD;  Location: [facility];  Service: Endoscopy;  Laterality: N/A;   ESOPHAGOGASTRODUODENOSCOPY (EGD) WITH PROPOFOL N/A 09/23/2021   Procedure: ESOPHAGOGASTRODUODENOSCOPY (EGD) WITH PROPOFOL;  Surgeon: [name], MD;  Location: [facility];  Service: Endoscopy;  Laterality: N/A;   FACIAL RECONSTRUCTION SURGERY  06/09/1968   BOTTLE CUT NOSE AND EYE, MULTIPLE SURGERIES   FINGER SURGERY Left 06/09/2013   THUMB   FINGER  SURGERY Right 06/09/1964   MIDDLE   FOOT SURGERY Right 06/09/1982   FUNDIPLICATION  06/10/1999   HERNIA REPAIR Left 1981/1957   RIGHT SIDE IN 1957   JOINT REPLACEMENT Left 2021   knee   KNEE ARTHROPLASTY Left 02/10/2020   Procedure: COMPUTER ASSISTED TOTAL KNEE ARTHROPLASTY - RNFA;  Surgeon: Donato Heinz, MD;  Location: [facility];  Service: Orthopedics;  Laterality: Left;   KNEE ARTHROSCOPY Right 08/14/2021   Procedure: ARTHROSCOPY KNEE, medial menisectomy and chondroplasty, loose bodies removal;  Surgeon: Donato Heinz, MD;  Location: [facility];  Service: Orthopedics;  Laterality: Right;   MASS EXCISION Left 02/16/2015   Procedure: LEFT BURSA ELBOW EXCISION;  Surgeon: Erin Sons, MD;  Location: [facility];  Service: Orthopedics;  Laterality: Left;   NISSEN FUNDOPLICATION  06/10/1999   NOSE SURGERY  12/01/2002   SEPTUM RECONSTRUCTION   POLYPECTOMY  12/18/2014   Procedure: POLYPECTOMY;  Surgeon: [name], MD;  Location: [facility];  Service: Endoscopy;;   SEPTOPLASTY  06/09/1997   SHOULDER SURGERY Right 06/09/1981   SHOULDER SURGERY Left 06/09/1977   WITH ELBOW (TENDON RELEASE)   SKIN SURGERY  06/09/2002   SKIN CANCER NOSE    Allergies: Allergies as of 10/21/2022 - Review Complete 10/17/2022  Allergen Reaction Noted   Gabapentin Other (See Comments) 03/27/2022   Lamotrigine Other (See Comments) 03/27/2022    Medications:  Current Outpatient Medications:    amLODipine (NORVASC) 10 MG tablet, Take 10 mg by mouth every morning., Disp: , Rfl:    amoxicillin (AMOXIL) 500 MG capsule, Take 2,000 mg by mouth See admin instructions. 1 hour prior to dental procedures, Disp: , Rfl:    ARIPiprazole (ABILIFY) 2 MG tablet, Take 2 mg by mouth every morning., Disp: , Rfl:    Brimonidine Tartrate (LUMIFY) 0.025 % SOLN, Place 1 drop into both eyes 2 (two) times daily as needed (redness)., Disp: , Rfl:    Cholecalciferol (VITAMIN D-3 PO), Take 1 tablet by mouth daily  at 6 (six) AM., Disp: , Rfl:    clonazePAM (KLONOPIN) 0.5 MG tablet, Take 0.5-1 mg by mouth 2 (two) times daily as needed for anxiety., Disp: , Rfl:    DULoxetine (CYMBALTA) 60 MG capsule, 60 mg every morning., Disp: , Rfl:    fluticasone (FLONASE) 50 MCG/ACT nasal spray, Place 2 sprays into both nostrils as needed for allergies or rhinitis., Disp: , Rfl:    furosemide (LASIX) 40 MG tablet, Take 40 mg by mouth daily as needed for fluid. , Disp: , Rfl:    ipratropium (ATROVENT) 0.03 % nasal spray, Place 2 sprays into both nostrils 2 (two) times daily., Disp: , Rfl:    Multiple Vitamin (MULTIVITAMIN WITH MINERALS) TABS tablet, Take 1 tablet by mouth daily., Disp: , Rfl:    NEEDLE, DISP, 23 G 23G X 1" MISC, Use 1 Units every 10 (ten) days., Disp: , Rfl:    sodium fluoride (FLUORISHIELD) 1.1 % GEL dental gel, Place 1 application onto teeth daily., Disp: , Rfl:    spironolactone (ALDACTONE) 25 MG tablet, Take 25 mg by mouth every morning., Disp: , Rfl:    testosterone cypionate (DEPOTESTOSTERONE CYPIONATE) 200 MG/ML injection, ADMINISTER 0.5 ML IN THE MUSCLE 1 TIME A WEEK (Patient taking differently: Inject 200 mg into the muscle once a week. ADMINISTER 0.5 ML IN THE MUSCLE 1 TIME A WEEK), Disp: 10 mL, Rfl: 0   traZODone (DESYREL) 100 MG tablet, Take 100 mg by mouth at bedtime., Disp: , Rfl:    vitamin B-12 (CYANOCOBALAMIN) 1000 MCG tablet, Take 2,000 mcg by mouth daily., Disp: , Rfl:   Social History: Social History   Tobacco Use   Smoking status: Never   Smokeless tobacco: Never  Vaping Use   Vaping Use: Never used  Substance Use Topics   Alcohol use: Yes    Alcohol/week: 1.0 standard drink of alcohol    Types: 1 Glasses of wine per week    Comment: 1-2 drinks every night   Drug use: No    Family Medical History: Family History  Problem Relation Age of Onset   Arthritis Mother    Asthma Mother    Cancer Mother    Heart disease Father     Physical Examination: There were no vitals  filed for this visit.  General: Patient is in no apparent distress. Attention to examination is appropriate.  Neck:   Supple.  Full range of motion.  Respiratory: Patient is breathing without any difficulty.  NEUROLOGICAL:     Awake, alert, oriented to person, place, and time.  Speech is clear and fluent.   Cranial Nerves: Pupils equal round and reactive to light.  Facial tone is symmetric.  Facial sensation is symmetric. Shoulder shrug is symmetric. Tongue protrusion is midline.  There is no pronator drift.  Strength: Side Biceps Triceps Deltoid Interossei Grip Wrist Ext. Wrist Flex.  R 5 5 5 5 5 5 5   L 5 5 5 5 5 5 5    Side Iliopsoas Quads Hamstring PF DF EHL  R 5 5 5 5 5 5   L 5 5 5 5 5 5    Reflexes are ***2+ and symmetric at the biceps, triceps, brachioradialis, patella and achilles.   Hoffman's is absent.   Bilateral upper and lower extremity sensation is intact to light touch.    No evidence of dysmetria noted.  Gait is normal.     Medical Decision Making  Imaging: ***  I have personally reviewed the images and agree with the above interpretation.  Assessment and Plan: Mr. Dayal is a pleasant 72 y.o. male with ***    Thank you for involving me in the care of this patient.      Samariya Rockhold K. Myer Haff MD, [facility]

## 2022-10-21 ENCOUNTER — Ambulatory Visit (INDEPENDENT_AMBULATORY_CARE_PROVIDER_SITE_OTHER): Payer: HMO | Admitting: Neurosurgery

## 2022-10-21 ENCOUNTER — Encounter: Payer: Self-pay | Admitting: Neurosurgery

## 2022-10-21 VITALS — BP 146/84 | Ht 69.0 in | Wt 233.0 lb

## 2022-10-21 DIAGNOSIS — M5442 Lumbago with sciatica, left side: Secondary | ICD-10-CM | POA: Diagnosis not present

## 2022-10-21 DIAGNOSIS — G8929 Other chronic pain: Secondary | ICD-10-CM

## 2022-10-21 DIAGNOSIS — M4310 Spondylolisthesis, site unspecified: Secondary | ICD-10-CM

## 2022-10-21 DIAGNOSIS — M5136 Other intervertebral disc degeneration, lumbar region: Secondary | ICD-10-CM

## 2022-10-21 DIAGNOSIS — M4316 Spondylolisthesis, lumbar region: Secondary | ICD-10-CM | POA: Diagnosis not present

## 2022-10-21 DIAGNOSIS — M5441 Lumbago with sciatica, right side: Secondary | ICD-10-CM

## 2022-10-24 ENCOUNTER — Other Ambulatory Visit: Payer: Self-pay

## 2022-10-24 ENCOUNTER — Encounter
Admission: RE | Disposition: A | Discharge status: Home / Self Care | Payer: Self-pay | Source: Ambulatory Visit | Attending: Orthopedic Surgery

## 2022-10-24 ENCOUNTER — Encounter: Payer: Self-pay | Admitting: Orthopedic Surgery

## 2022-10-24 ENCOUNTER — Observation Stay
Admission: RE | Admit: 2022-10-24 | Discharge: 2022-10-25 | Disposition: A | Discharge status: Home / Self Care | Payer: HMO | Source: Ambulatory Visit | Attending: Orthopedic Surgery | Admitting: Orthopedic Surgery

## 2022-10-24 ENCOUNTER — Observation Stay: Payer: HMO

## 2022-10-24 ENCOUNTER — Ambulatory Visit: Payer: HMO | Admitting: Urgent Care

## 2022-10-24 ENCOUNTER — Ambulatory Visit: Payer: HMO | Admitting: Certified Registered Nurse Anesthetist

## 2022-10-24 DIAGNOSIS — Z87891 Personal history of nicotine dependence: Secondary | ICD-10-CM | POA: Insufficient documentation

## 2022-10-24 DIAGNOSIS — Z79899 Other long term (current) drug therapy: Secondary | ICD-10-CM | POA: Diagnosis not present

## 2022-10-24 DIAGNOSIS — Z471 Aftercare following joint replacement surgery: Secondary | ICD-10-CM | POA: Diagnosis not present

## 2022-10-24 DIAGNOSIS — D751 Secondary polycythemia: Secondary | ICD-10-CM

## 2022-10-24 DIAGNOSIS — R0902 Hypoxemia: Secondary | ICD-10-CM | POA: Diagnosis not present

## 2022-10-24 DIAGNOSIS — Z85828 Personal history of other malignant neoplasm of skin: Secondary | ICD-10-CM | POA: Insufficient documentation

## 2022-10-24 DIAGNOSIS — D72819 Decreased white blood cell count, unspecified: Secondary | ICD-10-CM

## 2022-10-24 DIAGNOSIS — M1711 Unilateral primary osteoarthritis, right knee: Secondary | ICD-10-CM | POA: Diagnosis not present

## 2022-10-24 DIAGNOSIS — I10 Essential (primary) hypertension: Secondary | ICD-10-CM | POA: Insufficient documentation

## 2022-10-24 DIAGNOSIS — R339 Retention of urine, unspecified: Secondary | ICD-10-CM

## 2022-10-24 DIAGNOSIS — Z8619 Personal history of other infectious and parasitic diseases: Secondary | ICD-10-CM

## 2022-10-24 DIAGNOSIS — Z96659 Presence of unspecified artificial knee joint: Secondary | ICD-10-CM

## 2022-10-24 HISTORY — PX: KNEE ARTHROPLASTY: SHX992

## 2022-10-24 LAB — BLOOD GAS, ARTERIAL
Acid-Base Excess: 1.3 mmol/L (ref 0.0–2.0)
Bicarbonate: 28.2 mmol/L — ABNORMAL HIGH (ref 20.0–28.0)
O2 Content: 15 L/min
O2 Saturation: 97.3 %
Patient temperature: 38.2
pCO2 arterial: 54 mmHg — ABNORMAL HIGH (ref 32–48)
pH, Arterial: 7.33 — ABNORMAL LOW (ref 7.35–7.45)
pO2, Arterial: 89 mmHg (ref 83–108)

## 2022-10-24 SURGERY — ARTHROPLASTY, KNEE, TOTAL, USING IMAGELESS COMPUTER-ASSISTED NAVIGATION
Anesthesia: General | Site: Knee | Laterality: Right

## 2022-10-24 MED ORDER — ALUM & MAG HYDROXIDE-SIMETH 200-200-20 MG/5ML PO SUSP: 30.0000 mL | ORAL | Status: DC | PRN

## 2022-10-24 MED ORDER — FENTANYL CITRATE (PF) 100 MCG/2ML IJ SOLN
INTRAMUSCULAR | Status: AC
  Filled 2022-10-24: qty 2

## 2022-10-24 MED ORDER — FENTANYL CITRATE (PF) 100 MCG/2ML IJ SOLN: 25.0000 ug | INTRAMUSCULAR | Status: DC | PRN

## 2022-10-24 MED ORDER — HYDROMORPHONE HCL 1 MG/ML IJ SOLN
INTRAMUSCULAR | Status: AC
  Filled 2022-10-24: qty 1

## 2022-10-24 MED ORDER — OXYCODONE HCL 5 MG PO TABS: 5.0000 mg | ORAL_TABLET | Freq: Once | ORAL | Status: DC | PRN

## 2022-10-24 MED ORDER — ACETAMINOPHEN 10 MG/ML IV SOLN
INTRAVENOUS | Status: DC | PRN
  Administered 2022-10-24: 1000 mg via INTRAVENOUS

## 2022-10-24 MED ORDER — ONDANSETRON HCL 4 MG/2ML IJ SOLN: 4.0000 mg | Freq: Four times a day (QID) | INTRAMUSCULAR | Status: DC | PRN

## 2022-10-24 MED ORDER — CELECOXIB 200 MG PO CAPS
ORAL_CAPSULE | ORAL | Status: AC
  Filled 2022-10-24: qty 2

## 2022-10-24 MED ORDER — BUPIVACAINE LIPOSOME 1.3 % IJ SUSP
INTRAMUSCULAR | Status: AC
  Filled 2022-10-24: qty 20

## 2022-10-24 MED ORDER — OXYCODONE HCL 5 MG PO TABS
10.0000 mg | ORAL_TABLET | ORAL | Status: DC | PRN
  Administered 2022-10-24 – 2022-10-25 (×4): 10 mg via ORAL
  Filled 2022-10-24 (×3): qty 2

## 2022-10-24 MED ORDER — SODIUM CHLORIDE (PF) 0.9 % IJ SOLN
INTRAMUSCULAR | Status: DC | PRN
  Administered 2022-10-24: 120 mL via INTRAMUSCULAR

## 2022-10-24 MED ORDER — PANTOPRAZOLE SODIUM 40 MG PO TBEC
40.0000 mg | DELAYED_RELEASE_TABLET | Freq: Two times a day (BID) | ORAL | Status: DC
  Administered 2022-10-24 – 2022-10-25 (×2): 40 mg via ORAL
  Filled 2022-10-24 (×2): qty 1

## 2022-10-24 MED ORDER — BUPIVACAINE HCL (PF) 0.25 % IJ SOLN
INTRAMUSCULAR | Status: AC
  Filled 2022-10-24: qty 60

## 2022-10-24 MED ORDER — OXYCODONE HCL 5 MG/5ML PO SOLN: 5.0000 mg | Freq: Once | ORAL | Status: DC | PRN

## 2022-10-24 MED ORDER — IPRATROPIUM-ALBUTEROL 0.5-2.5 (3) MG/3ML IN SOLN
3.0000 mL | RESPIRATORY_TRACT | Status: AC
  Administered 2022-10-24: 3 mL via RESPIRATORY_TRACT

## 2022-10-24 MED ORDER — LIDOCAINE HCL (PF) 2 % IJ SOLN
INTRAMUSCULAR | Status: AC
  Filled 2022-10-24: qty 5

## 2022-10-24 MED ORDER — SPIRONOLACTONE 25 MG PO TABS
25.0000 mg | ORAL_TABLET | ORAL | Status: DC
  Administered 2022-10-25: 25 mg via ORAL
  Filled 2022-10-24: qty 1

## 2022-10-24 MED ORDER — ACETAMINOPHEN 10 MG/ML IV SOLN
1000.0000 mg | Freq: Four times a day (QID) | INTRAVENOUS | Status: DC
  Administered 2022-10-24 – 2022-10-25 (×2): 1000 mg via INTRAVENOUS
  Filled 2022-10-24 (×3): qty 100

## 2022-10-24 MED ORDER — SURGIPHOR WOUND IRRIGATION SYSTEM - OPTIME: TOPICAL | Status: DC | PRN

## 2022-10-24 MED ORDER — SUGAMMADEX SODIUM 200 MG/2ML IV SOLN
INTRAVENOUS | Status: DC | PRN
  Administered 2022-10-24: 200 mg via INTRAVENOUS

## 2022-10-24 MED ORDER — DEXAMETHASONE SODIUM PHOSPHATE 10 MG/ML IJ SOLN
INTRAMUSCULAR | Status: DC | PRN
  Administered 2022-10-24: 8 mg via INTRAVENOUS

## 2022-10-24 MED ORDER — SODIUM CHLORIDE FLUSH 0.9 % IV SOLN
INTRAVENOUS | Status: AC
  Filled 2022-10-24: qty 40

## 2022-10-24 MED ORDER — GLYCOPYRROLATE 0.2 MG/ML IJ SOLN
INTRAMUSCULAR | Status: DC | PRN
  Administered 2022-10-24: .2 mg via INTRAVENOUS

## 2022-10-24 MED ORDER — IPRATROPIUM BROMIDE 0.03 % NA SOLN
2.0000 | Freq: Two times a day (BID) | NASAL | Status: DC
  Administered 2022-10-24 – 2022-10-25 (×2): 2 via NASAL
  Filled 2022-10-24: qty 30

## 2022-10-24 MED ORDER — TRANEXAMIC ACID-NACL 1000-0.7 MG/100ML-% IV SOLN
INTRAVENOUS | Status: DC | PRN
  Administered 2022-10-24: 1000 mg via INTRAVENOUS

## 2022-10-24 MED ORDER — SENNOSIDES-DOCUSATE SODIUM 8.6-50 MG PO TABS
1.0000 | ORAL_TABLET | Freq: Two times a day (BID) | ORAL | Status: DC
  Administered 2022-10-24 – 2022-10-25 (×2): 1 via ORAL
  Filled 2022-10-24 (×2): qty 1

## 2022-10-24 MED ORDER — CEFAZOLIN SODIUM-DEXTROSE 2-4 GM/100ML-% IV SOLN: 2.0000 g | INTRAVENOUS | Status: DC

## 2022-10-24 MED ORDER — HYDROMORPHONE HCL 1 MG/ML IJ SOLN
INTRAMUSCULAR | Status: DC | PRN
  Administered 2022-10-24: .5 mg via INTRAVENOUS
  Administered 2022-10-24: 1 mg via INTRAVENOUS

## 2022-10-24 MED ORDER — ACETAMINOPHEN 325 MG PO TABS: 325.0000 mg | ORAL_TABLET | Freq: Four times a day (QID) | ORAL | Status: DC | PRN

## 2022-10-24 MED ORDER — LIDOCAINE HCL (CARDIAC) PF 100 MG/5ML IV SOSY
PREFILLED_SYRINGE | INTRAVENOUS | Status: DC | PRN
  Administered 2022-10-24: 100 mg via INTRAVENOUS

## 2022-10-24 MED ORDER — MAGNESIUM HYDROXIDE 400 MG/5ML PO SUSP
30.0000 mL | Freq: Every day | ORAL | Status: DC
  Administered 2022-10-24 – 2022-10-25 (×2): 30 mL via ORAL
  Filled 2022-10-24 (×2): qty 30

## 2022-10-24 MED ORDER — CEFAZOLIN SODIUM-DEXTROSE 2-3 GM-%(50ML) IV SOLR
INTRAVENOUS | Status: DC | PRN
  Administered 2022-10-24: 2 g via INTRAVENOUS

## 2022-10-24 MED ORDER — ENSURE SURGERY PO LIQD
296.0000 mL | Freq: Once | ORAL | Status: AC
  Administered 2022-10-24: 296 mL via ORAL

## 2022-10-24 MED ORDER — DEXAMETHASONE SODIUM PHOSPHATE 10 MG/ML IJ SOLN
INTRAMUSCULAR | Status: AC
  Filled 2022-10-24: qty 1

## 2022-10-24 MED ORDER — MIDAZOLAM HCL 2 MG/2ML IJ SOLN
INTRAMUSCULAR | Status: AC
  Filled 2022-10-24: qty 2

## 2022-10-24 MED ORDER — METOCLOPRAMIDE HCL 5 MG PO TABS
10.0000 mg | ORAL_TABLET | Freq: Three times a day (TID) | ORAL | Status: DC
  Administered 2022-10-24 – 2022-10-25 (×2): 10 mg via ORAL
  Filled 2022-10-24 (×2): qty 2

## 2022-10-24 MED ORDER — LACTATED RINGERS IV SOLN: INTRAVENOUS | Status: DC

## 2022-10-24 MED ORDER — ROCURONIUM BROMIDE 10 MG/ML (PF) SYRINGE
PREFILLED_SYRINGE | INTRAVENOUS | Status: AC
  Filled 2022-10-24: qty 10

## 2022-10-24 MED ORDER — MIDAZOLAM HCL 5 MG/5ML IJ SOLN
INTRAMUSCULAR | Status: DC | PRN
  Administered 2022-10-24: 2 mg via INTRAVENOUS

## 2022-10-24 MED ORDER — FAMOTIDINE 20 MG PO TABS
ORAL_TABLET | ORAL | Status: AC
  Filled 2022-10-24: qty 1

## 2022-10-24 MED ORDER — PROPOFOL 1000 MG/100ML IV EMUL
INTRAVENOUS | Status: AC
  Filled 2022-10-24: qty 100

## 2022-10-24 MED ORDER — DEXAMETHASONE SODIUM PHOSPHATE 10 MG/ML IJ SOLN
8.0000 mg | Freq: Once | INTRAMUSCULAR | Status: DC
  Administered 2022-10-24: 8 mg via INTRAVENOUS

## 2022-10-24 MED ORDER — BISACODYL 10 MG RE SUPP: 10.0000 mg | Freq: Every day | RECTAL | Status: DC | PRN

## 2022-10-24 MED ORDER — PHENYLEPHRINE 80 MCG/ML (10ML) SYRINGE FOR IV PUSH (FOR BLOOD PRESSURE SUPPORT)
PREFILLED_SYRINGE | INTRAVENOUS | Status: DC | PRN
  Administered 2022-10-24 (×4): 80 ug via INTRAVENOUS
  Administered 2022-10-24: 40 ug via INTRAVENOUS
  Administered 2022-10-24 (×3): 80 ug via INTRAVENOUS
  Administered 2022-10-24: 40 ug via INTRAVENOUS
  Administered 2022-10-24: 160 ug via INTRAVENOUS
  Administered 2022-10-24 (×3): 80 ug via INTRAVENOUS

## 2022-10-24 MED ORDER — ARIPIPRAZOLE 2 MG PO TABS
2.0000 mg | ORAL_TABLET | ORAL | Status: DC
  Administered 2022-10-25: 2 mg via ORAL
  Filled 2022-10-24: qty 1

## 2022-10-24 MED ORDER — CLONAZEPAM 0.5 MG PO TABS: 0.5000 mg | ORAL_TABLET | Freq: Two times a day (BID) | ORAL | Status: DC | PRN

## 2022-10-24 MED ORDER — FLEET ENEMA 7-19 GM/118ML RE ENEM: 1.0000 | ENEMA | Freq: Once | RECTAL | Status: DC | PRN

## 2022-10-24 MED ORDER — ACETAMINOPHEN 10 MG/ML IV SOLN
INTRAVENOUS | Status: AC
  Filled 2022-10-24: qty 100

## 2022-10-24 MED ORDER — PROPOFOL 10 MG/ML IV BOLUS
INTRAVENOUS | Status: DC | PRN
  Administered 2022-10-24: 150 mg via INTRAVENOUS

## 2022-10-24 MED ORDER — BRIMONIDINE TARTRATE 0.025 % OP SOLN: 1.0000 [drp] | Freq: Two times a day (BID) | OPHTHALMIC | Status: DC | PRN

## 2022-10-24 MED ORDER — CHLORHEXIDINE GLUCONATE 4 % EX SOLN
60.0000 mL | Freq: Once | CUTANEOUS | Status: AC
  Administered 2022-10-24: 4 via TOPICAL

## 2022-10-24 MED ORDER — ROCURONIUM BROMIDE 100 MG/10ML IV SOLN
INTRAVENOUS | Status: DC | PRN
  Administered 2022-10-24: 20 mg via INTRAVENOUS
  Administered 2022-10-24: 50 mg via INTRAVENOUS
  Administered 2022-10-24 (×3): 20 mg via INTRAVENOUS
  Administered 2022-10-24: 30 mg via INTRAVENOUS

## 2022-10-24 MED ORDER — DIPHENHYDRAMINE HCL 12.5 MG/5ML PO ELIX: 12.5000 mg | ORAL_SOLUTION | ORAL | Status: DC | PRN

## 2022-10-24 MED ORDER — LOSARTAN POTASSIUM 25 MG PO TABS
25.0000 mg | ORAL_TABLET | Freq: Every day | ORAL | Status: DC
  Administered 2022-10-24 – 2022-10-25 (×2): 25 mg via ORAL
  Filled 2022-10-24 (×2): qty 1

## 2022-10-24 MED ORDER — ONDANSETRON HCL 4 MG/2ML IJ SOLN
INTRAMUSCULAR | Status: DC | PRN
  Administered 2022-10-24: 4 mg via INTRAVENOUS

## 2022-10-24 MED ORDER — SODIUM CHLORIDE 0.9 % IV SOLN: INTRAVENOUS | Status: DC

## 2022-10-24 MED ORDER — ORAL CARE MOUTH RINSE: 15.0000 mL | Freq: Once | OROMUCOSAL | Status: AC

## 2022-10-24 MED ORDER — MENTHOL 3 MG MT LOZG: 1.0000 | LOZENGE | OROMUCOSAL | Status: DC | PRN

## 2022-10-24 MED ORDER — DULOXETINE HCL 30 MG PO CPEP
60.0000 mg | ORAL_CAPSULE | ORAL | Status: DC
  Administered 2022-10-25: 60 mg via ORAL
  Filled 2022-10-24: qty 2

## 2022-10-24 MED ORDER — CEFAZOLIN SODIUM-DEXTROSE 2-4 GM/100ML-% IV SOLN
INTRAVENOUS | Status: AC
  Filled 2022-10-24: qty 100

## 2022-10-24 MED ORDER — CELECOXIB 200 MG PO CAPS
200.0000 mg | ORAL_CAPSULE | Freq: Two times a day (BID) | ORAL | Status: DC
  Administered 2022-10-24 – 2022-10-25 (×2): 200 mg via ORAL
  Filled 2022-10-24 (×2): qty 1

## 2022-10-24 MED ORDER — FUROSEMIDE 40 MG PO TABS: 40.0000 mg | ORAL_TABLET | Freq: Every day | ORAL | Status: DC | PRN

## 2022-10-24 MED ORDER — IPRATROPIUM-ALBUTEROL 0.5-2.5 (3) MG/3ML IN SOLN
RESPIRATORY_TRACT | Status: AC
  Filled 2022-10-24: qty 3

## 2022-10-24 MED ORDER — FAMOTIDINE 20 MG PO TABS
20.0000 mg | ORAL_TABLET | Freq: Once | ORAL | Status: AC
  Administered 2022-10-24: 20 mg via ORAL

## 2022-10-24 MED ORDER — ONDANSETRON HCL 4 MG PO TABS: 4.0000 mg | ORAL_TABLET | Freq: Four times a day (QID) | ORAL | Status: DC | PRN

## 2022-10-24 MED ORDER — FLUTICASONE PROPIONATE 50 MCG/ACT NA SUSP: 2.0000 | NASAL | Status: DC | PRN

## 2022-10-24 MED ORDER — OXYCODONE HCL 5 MG PO TABS
5.0000 mg | ORAL_TABLET | ORAL | Status: DC | PRN
  Filled 2022-10-24 (×2): qty 1

## 2022-10-24 MED ORDER — ONDANSETRON HCL 4 MG/2ML IJ SOLN
INTRAMUSCULAR | Status: AC
  Filled 2022-10-24: qty 2

## 2022-10-24 MED ORDER — SODIUM CHLORIDE 0.9 % IR SOLN
Status: DC | PRN
  Administered 2022-10-24: 3000 mL

## 2022-10-24 MED ORDER — CHLORHEXIDINE GLUCONATE 0.12 % MT SOLN
15.0000 mL | Freq: Once | OROMUCOSAL | Status: AC
  Administered 2022-10-24: 15 mL via OROMUCOSAL

## 2022-10-24 MED ORDER — TRANEXAMIC ACID-NACL 1000-0.7 MG/100ML-% IV SOLN
INTRAVENOUS | Status: AC
  Filled 2022-10-24: qty 100

## 2022-10-24 MED ORDER — PHENOL 1.4 % MT LIQD: 1.0000 | OROMUCOSAL | Status: DC | PRN

## 2022-10-24 MED ORDER — CHLORHEXIDINE GLUCONATE 0.12 % MT SOLN
OROMUCOSAL | Status: AC
  Filled 2022-10-24: qty 15

## 2022-10-24 MED ORDER — ENOXAPARIN SODIUM 30 MG/0.3ML IJ SOSY
30.0000 mg | PREFILLED_SYRINGE | Freq: Two times a day (BID) | INTRAMUSCULAR | Status: DC
  Administered 2022-10-25: 30 mg via SUBCUTANEOUS
  Filled 2022-10-24: qty 0.3

## 2022-10-24 MED ORDER — CELECOXIB 200 MG PO CAPS
400.0000 mg | ORAL_CAPSULE | Freq: Once | ORAL | Status: AC
  Administered 2022-10-24: 400 mg via ORAL

## 2022-10-24 MED ORDER — HYDROMORPHONE HCL 1 MG/ML IJ SOLN: 0.5000 mg | INTRAMUSCULAR | Status: DC | PRN

## 2022-10-24 MED ORDER — TRANEXAMIC ACID-NACL 1000-0.7 MG/100ML-% IV SOLN
1000.0000 mg | Freq: Once | INTRAVENOUS | Status: AC
  Administered 2022-10-24: 1000 mg via INTRAVENOUS

## 2022-10-24 MED ORDER — TRANEXAMIC ACID 1000 MG/10ML IV SOLN
INTRAVENOUS | Status: AC
  Filled 2022-10-24: qty 10

## 2022-10-24 MED ORDER — TRAZODONE HCL 100 MG PO TABS
100.0000 mg | ORAL_TABLET | Freq: Every day | ORAL | Status: DC
  Administered 2022-10-24: 100 mg via ORAL
  Filled 2022-10-24: qty 1

## 2022-10-24 MED ORDER — TRAMADOL HCL 50 MG PO TABS: 50.0000 mg | ORAL_TABLET | ORAL | Status: DC | PRN

## 2022-10-24 MED ORDER — FENTANYL CITRATE (PF) 100 MCG/2ML IJ SOLN
INTRAMUSCULAR | Status: DC | PRN
  Administered 2022-10-24 (×4): 50 ug via INTRAVENOUS

## 2022-10-24 MED ORDER — TRANEXAMIC ACID-NACL 1000-0.7 MG/100ML-% IV SOLN: 1000.0000 mg | INTRAVENOUS | Status: DC

## 2022-10-24 MED ORDER — DEXMEDETOMIDINE HCL IN NACL 80 MCG/20ML IV SOLN
INTRAVENOUS | Status: DC | PRN
  Administered 2022-10-24: 4 ug via INTRAVENOUS
  Administered 2022-10-24: 12 ug via INTRAVENOUS
  Administered 2022-10-24 (×2): 4 ug via INTRAVENOUS

## 2022-10-24 MED ORDER — CEFAZOLIN SODIUM-DEXTROSE 2-4 GM/100ML-% IV SOLN
2.0000 g | Freq: Four times a day (QID) | INTRAVENOUS | Status: AC
  Administered 2022-10-24 (×2): 2 g via INTRAVENOUS
  Filled 2022-10-24 (×4): qty 100

## 2022-10-24 MED ORDER — PHENYLEPHRINE HCL-NACL 20-0.9 MG/250ML-% IV SOLN
INTRAVENOUS | Status: AC
  Filled 2022-10-24: qty 250

## 2022-10-24 MED ORDER — AMLODIPINE BESYLATE 10 MG PO TABS
10.0000 mg | ORAL_TABLET | ORAL | Status: DC
  Administered 2022-10-25: 10 mg via ORAL
  Filled 2022-10-24: qty 1

## 2022-10-24 SURGICAL SUPPLY — 78 items
ATTUNE MED DOME PAT 38 KNEE (Knees) IMPLANT
ATTUNE PS FEM RT SZ 6 CEM KNEE (Femur) IMPLANT
ATTUNE PSRP INSR SZ6 5 KNEE (Insert) IMPLANT
BASE TIBIAL ROT PLAT SZ 8 KNEE (Knees) IMPLANT
BATTERY INSTRU NAVIGATION (MISCELLANEOUS) ×4 IMPLANT
BLADE CLIPPER SURG (BLADE) IMPLANT
BLADE SAW 70X12.5 (BLADE) ×1 IMPLANT
BLADE SAW 90X13X1.19 OSCILLAT (BLADE) ×1 IMPLANT
BLADE SAW 90X25X1.19 OSCILLAT (BLADE) ×1 IMPLANT
BONE CEMENT GENTAMICIN (Cement) ×2 IMPLANT
BSPLAT TIB 8 CMNT ROT PLAT STR (Knees) ×1 IMPLANT
BTRY SRG DRVR LF (MISCELLANEOUS) ×4
CEMENT BONE GENTAMICIN 40 (Cement) IMPLANT
COOLER POLAR GLACIER W/PUMP (MISCELLANEOUS) ×1 IMPLANT
CUFF TOURN SGL QUICK 24 (TOURNIQUET CUFF)
CUFF TOURN SGL QUICK 34 (TOURNIQUET CUFF)
CUFF TRNQT CYL 24X4X16.5-23 (TOURNIQUET CUFF) IMPLANT
CUFF TRNQT CYL 34X4.125X (TOURNIQUET CUFF) IMPLANT
DRAPE 3/4 80X56 (DRAPES) ×1 IMPLANT
DRAPE INCISE IOBAN 66X45 STRL (DRAPES) IMPLANT
DRSG AQUACEL AG ADV 3.5X14 (GAUZE/BANDAGES/DRESSINGS) ×1 IMPLANT
DRSG DERMACEA NONADH 3X8 (GAUZE/BANDAGES/DRESSINGS) ×1 IMPLANT
DRSG MEPILEX SACRM 8.7X9.8 (GAUZE/BANDAGES/DRESSINGS) ×1 IMPLANT
DRSG TEGADERM 4X4.75 (GAUZE/BANDAGES/DRESSINGS) ×1 IMPLANT
DURAPREP 26ML APPLICATOR (WOUND CARE) ×2 IMPLANT
ELECT CAUTERY BLADE 6.4 (BLADE) ×1 IMPLANT
ELECT REM PT RETURN 9FT ADLT (ELECTROSURGICAL) ×1
ELECTRODE REM PT RTRN 9FT ADLT (ELECTROSURGICAL) ×1 IMPLANT
EX-PIN ORTHOLOCK NAV 4X150 (PIN) ×2 IMPLANT
GLOVE BIOGEL M STRL SZ7.5 (GLOVE) ×2 IMPLANT
GLOVE SRG 8 PF TXTR STRL LF DI (GLOVE) ×2 IMPLANT
GLOVE SURG SYN 8.0 (GLOVE) ×1 IMPLANT
GLOVE SURG SYN 8.0 PF PI (GLOVE) ×1 IMPLANT
GLOVE SURG UNDER POLY LF SZ8 (GLOVE) ×2
GOWN STRL REUS W/ TWL LRG LVL3 (GOWN DISPOSABLE) ×1 IMPLANT
GOWN STRL REUS W/ TWL XL LVL3 (GOWN DISPOSABLE) ×1 IMPLANT
GOWN STRL REUS W/TWL LRG LVL3 (GOWN DISPOSABLE) ×1
GOWN STRL REUS W/TWL XL LVL3 (GOWN DISPOSABLE) ×1
GOWN TOGA ZIPPER T7+ PEEL AWAY (MISCELLANEOUS) ×1 IMPLANT
HANDLE YANKAUER SUCT OPEN TIP (MISCELLANEOUS) ×1 IMPLANT
HEMOVAC 400CC 10FR (MISCELLANEOUS) ×1 IMPLANT
HOLDER FOLEY CATH W/STRAP (MISCELLANEOUS) ×1 IMPLANT
HOOD PEEL AWAY T7 (MISCELLANEOUS) ×1 IMPLANT
IV NS IRRIG 3000ML ARTHROMATIC (IV SOLUTION) ×1 IMPLANT
KIT TURNOVER KIT A (KITS) ×1 IMPLANT
KNIFE SCULPS 14X20 (INSTRUMENTS) ×1 IMPLANT
MANIFOLD NEPTUNE II (INSTRUMENTS) ×2 IMPLANT
NDL SPNL 20GX3.5 QUINCKE YW (NEEDLE) ×2 IMPLANT
NEEDLE SPNL 20GX3.5 QUINCKE YW (NEEDLE) ×2 IMPLANT
NS IRRIG 500ML POUR BTL (IV SOLUTION) ×1 IMPLANT
PACK TOTAL KNEE (MISCELLANEOUS) ×1 IMPLANT
PAD ABD DERMACEA PRESS 5X9 (GAUZE/BANDAGES/DRESSINGS) ×2 IMPLANT
PAD WRAPON POLAR KNEE (MISCELLANEOUS) ×1 IMPLANT
PIN DRILL FIX HALF THREAD (BIT) ×2 IMPLANT
PIN DRILL QUICK PACK (PIN) ×2 IMPLANT
PIN FIXATION 1/8DIA X 3INL (PIN) ×1 IMPLANT
PULSAVAC PLUS IRRIG FAN TIP (DISPOSABLE) ×1
SOL PREP PVP 2OZ (MISCELLANEOUS) ×1
SOLUTION IRRIG SURGIPHOR (IV SOLUTION) ×1 IMPLANT
SOLUTION PREP PVP 2OZ (MISCELLANEOUS) ×1 IMPLANT
SPONGE DRAIN TRACH 4X4 STRL 2S (GAUZE/BANDAGES/DRESSINGS) ×1 IMPLANT
STAPLER SKIN PROX 35W (STAPLE) ×1 IMPLANT
STOCKINETTE IMPERV 14X48 (MISCELLANEOUS) ×1 IMPLANT
STRAP TIBIA SHORT (MISCELLANEOUS) ×1 IMPLANT
SUCTION FRAZIER HANDLE 10FR (MISCELLANEOUS) ×1
SUCTION TUBE FRAZIER 10FR DISP (MISCELLANEOUS) ×1 IMPLANT
SUT VIC AB 0 CT1 36 (SUTURE) ×1 IMPLANT
SUT VIC AB 1 CT1 36 (SUTURE) ×2 IMPLANT
SUT VIC AB 2-0 CT2 27 (SUTURE) ×1 IMPLANT
SYR 30ML LL (SYRINGE) ×2 IMPLANT
TIBIAL BASE ROT PLAT SZ 8 KNEE (Knees) ×1 IMPLANT
TIP FAN IRRIG PULSAVAC PLUS (DISPOSABLE) ×1 IMPLANT
TOWEL OR 17X26 4PK STRL BLUE (TOWEL DISPOSABLE) IMPLANT
TOWER CARTRIDGE SMART MIX (DISPOSABLE) ×1 IMPLANT
TRAP FLUID SMOKE EVACUATOR (MISCELLANEOUS) ×1 IMPLANT
TRAY FOLEY MTR SLVR 16FR STAT (SET/KITS/TRAYS/PACK) ×1 IMPLANT
WATER STERILE IRR 1000ML POUR (IV SOLUTION) ×1 IMPLANT
WRAPON POLAR PAD KNEE (MISCELLANEOUS) ×1

## 2022-10-24 NOTE — Transfer of Care (Signed)
Immediate Anesthesia Transfer of Care Note  Patient: [name]  Procedure(s) Performed: COMPUTER ASSISTED TOTAL KNEE ARTHROPLASTY (Right: Knee)  Patient Location: PACU  Anesthesia Type:General  Level of Consciousness: drowsy  Airway & Oxygen Therapy: Patient Spontanous Breathing and Patient connected to face mask oxygen  Post-op Assessment: Report given to RN and Post -op Vital signs reviewed and stable  Post vital signs: Reviewed and stable  Last Vitals:  Vitals Value Taken Time  BP 137/89 10/24/22 1525  Temp    Pulse 88 10/24/22 1526  Resp 15 10/24/22 1526  SpO2 92 % 10/24/22 1526  Vitals shown include unvalidated device data.  Last Pain:  Vitals:   10/24/22 0952  PainSc: 4          Complications: No notable events documented.

## 2022-10-24 NOTE — Anesthesia Procedure Notes (Addendum)
Procedure Name: Intubation Date/Time: 10/24/2022 11:37 AM  Performed by: Malva Cogan, CRNAPre-anesthesia Checklist: Patient identified, Patient being monitored, Timeout performed, Emergency Drugs available and Suction available Patient Re-evaluated:Patient Re-evaluated prior to induction Oxygen Delivery Method: Circle system utilized Preoxygenation: Pre-oxygenation with 100% oxygen Induction Type: IV induction Ventilation: Mask ventilation without difficulty Laryngoscope Size: Mac and 4 Grade View: Grade I Tube type: Oral Tube size: 7.0 mm Number of attempts: 1 Airway Equipment and Method: Stylet Placement Confirmation: ETT inserted through vocal cords under direct vision, positive ETCO2 and breath sounds checked- equal and bilateral Secured at: 23 cm Tube secured with: Tape Dental Injury: Teeth and Oropharynx as per pre-operative assessment

## 2022-10-24 NOTE — Op Note (Signed)
OPERATIVE NOTE  DATE OF SURGERY:  10/24/2022  PATIENT NAME:  RAMEIR IVANOVA   DOB: 1951-03-25  MRN: [id number]  PRE-OPERATIVE DIAGNOSIS: Degenerative arthrosis of the right knee, primary  POST-OPERATIVE DIAGNOSIS:  Same  PROCEDURE:  Right total knee arthroplasty using computer-assisted navigation  SURGEON:  Jena Gauss. M.D.  ASSISTANT:  Gean Birchwood, PA-C (present and scrubbed throughout the case, critical for assistance with exposure, retraction, instrumentation, and closure)  ANESTHESIA: general  ESTIMATED BLOOD LOSS: 50 mL  FLUIDS REPLACED: 1000 mL of crystalloid  TOURNIQUET TIME: 92 minutes  DRAINS: 2 medium Hemovac drains  SOFT TISSUE RELEASES: Anterior cruciate ligament, posterior cruciate ligament, deep medial collateral ligament, patellofemoral ligament  IMPLANTS UTILIZED: DePuy Attune size 6 posterior stabilized femoral component (cemented), size 8 rotating platform tibial component (cemented), 38 mm medialized dome patella (cemented), and a 5 mm stabilized rotating platform polyethylene insert.  INDICATIONS FOR SURGERY: GENARD RASMUSON is a 72 y.o. year old male with a long history of progressive knee pain. X-rays demonstrated severe degenerative changes in tricompartmental fashion. The patient had not seen any significant improvement despite conservative nonsurgical intervention. After discussion of the risks and benefits of surgical intervention, the patient expressed understanding of the risks benefits and agree with plans for total knee arthroplasty.   The risks, benefits, and alternatives were discussed at length including but not limited to the risks of infection, bleeding, nerve injury, stiffness, blood clots, the need for revision surgery, cardiopulmonary complications, among others, and they were willing to proceed.  PROCEDURE IN DETAIL: The patient was brought into the operating room and, after adequate general anesthesia was achieved, a tourniquet  was placed on the patient's upper thigh. The patient's knee and leg were cleaned and prepped with alcohol and DuraPrep and draped in the usual sterile fashion. A "timeout" was performed as per usual protocol. The lower extremity was exsanguinated using an Esmarch, and the tourniquet was inflated to 300 mmHg. An anterior longitudinal incision was made followed by a standard mid vastus approach. The deep fibers of the medial collateral ligament were elevated in a subperiosteal fashion off of the medial flare of the tibia so as to maintain a continuous soft tissue sleeve. The patella was subluxed laterally and the patellofemoral ligament was incised. Inspection of the knee demonstrated severe degenerative changes with full-thickness loss of articular cartilage. Osteophytes were debrided using a rongeur. Anterior and posterior cruciate ligaments were excised. Two 4.0 mm Schanz pins were inserted in the femur and into the tibia for attachment of the array of trackers used for computer-assisted navigation. Hip center was identified using a circumduction technique. Distal landmarks were mapped using the computer. The distal femur and proximal tibia were mapped using the computer. The distal femoral cutting guide was positioned using computer-assisted navigation so as to achieve a 5 distal valgus cut. The femur was sized and it was felt that a size 6 femoral component was appropriate. A size 6 femoral cutting guide was positioned and the anterior cut was performed and verified using the computer. This was followed by completion of the posterior and chamfer cuts. Femoral cutting guide for the central box was then positioned in the center box cut was performed.  Attention was then directed to the proximal tibia. Medial and lateral menisci were excised. The extramedullary tibial cutting guide was positioned using computer-assisted navigation so as to achieve a 0 varus-valgus alignment and 3 posterior slope. The cut was  performed and verified using the computer. The proximal tibia  was sized and it was felt that a size 8 tibial tray was appropriate. Tibial and femoral trials were inserted followed by insertion of a 5 mm polyethylene insert. This allowed for excellent mediolateral soft tissue balancing both in flexion and in full extension. Finally, the patella was cut and prepared so as to accommodate a 38 mm medialized dome patella. A patella trial was placed and the knee was placed through a range of motion with excellent patellar tracking appreciated. The femoral trial was removed after debridement of posterior osteophytes. The central post-hole for the tibial component was reamed followed by insertion of a keel punch. Tibial trials were then removed. Cut surfaces of bone were irrigated with copious amounts of normal saline using pulsatile lavage and then suctioned dry. Polymethylmethacrylate cement with gentamicin was prepared in the usual fashion using a vacuum mixer. Cement was applied to the cut surface of the proximal tibia as well as along the undersurface of a size 8 rotating platform tibial component. Tibial component was positioned and impacted into place. Excess cement was removed using Personal assistant. Cement was then applied to the cut surfaces of the femur as well as along the posterior flanges of the size 6 femoral component. The femoral component was positioned and impacted into place. Excess cement was removed using Personal assistant. A 5 mm polyethylene trial was inserted and the knee was brought into full extension with steady axial compression applied. Finally, cement was applied to the backside of a 38 mm medialized dome patella and the patellar component was positioned and patellar clamp applied. Excess cement was removed using Personal assistant. After adequate curing of the cement, the tourniquet was deflated after a total tourniquet time of 92 minutes. Hemostasis was achieved using electrocautery. The knee was  irrigated with copious amounts of normal saline using pulsatile lavage followed by 450 ml of Surgiphor and then suctioned dry. 20 mL of 1.3% Exparel and 60 mL of 0.25% Marcaine in 40 mL of normal saline was injected along the posterior capsule, medial and lateral gutters, and along the arthrotomy site. A 5 mm stabilized rotating platform polyethylene insert was inserted and the knee was placed through a range of motion with excellent mediolateral soft tissue balancing appreciated and excellent patellar tracking noted. 2 medium drains were placed in the wound bed and brought out through separate stab incisions. The medial parapatellar portion of the incision was reapproximated using interrupted sutures of #1 Vicryl. Subcutaneous tissue was approximated in layers using first #0 Vicryl followed #2-0 Vicryl. The skin was approximated with skin staples. A sterile dressing was applied.  The patient tolerated the procedure well and was transported to the recovery room in stable condition.    Capria Cartaya P. Angie Fava., M.D.

## 2022-10-24 NOTE — Anesthesia Preprocedure Evaluation (Signed)
Anesthesia Evaluation  Patient identified by MRN, date of birth, ID band Patient awake    Reviewed: Allergy & Precautions, NPO status , Patient's Chart, lab work & pertinent test results  History of Anesthesia Complications Negative for: history of anesthetic complications  Airway Mallampati: III  TM Distance: >3 FB Neck ROM: full    Dental  (+) Chipped   Pulmonary neg shortness of breath, sleep apnea    Pulmonary exam normal        Cardiovascular Exercise Tolerance: Good hypertension, (-) angina (-) Past MI Normal cardiovascular exam     Neuro/Psych  Neuromuscular disease  negative psych ROS   GI/Hepatic Neg liver ROS,GERD  Controlled,,  Endo/Other  negative endocrine ROS    Renal/GU Renal disease     Musculoskeletal   Abdominal   Peds  Hematology negative hematology ROS (+)   Anesthesia Other Findings Past Medical History: No date: Actinic keratosis No date: Anxiety No date: Arthritis No date: Back pain     Comment:  MULTIPLE BACK SURGERIES 10/09/2016: Basal cell carcinoma     Comment:  L sup med scapula  07/11/2019: Basal cell carcinoma     Comment:  L lat forehead above lat brow  08/29/2020: Basal cell carcinoma     Comment:  R prox medial pretibial, EDC 08/29/2020: Basal cell carcinoma     Comment:  R posterior ear, EDC 12/27/2020: BCC (basal cell carcinoma of skin)     Comment:  R epigastric - ED&C 12/07/2014: Benign essential HTN     Comment:  Last Assessment & Plan:  Relevant Hx: Course: Daily               Update: Today's Plan: CONTROLLED ON MEDS 03/31/2013: Benign prostatic hyperplasia with urinary obstruction No date: Bilateral sciatica No date: Chronic back pain 03/31/2013: Chronic inflammation of tunica albuginea No date: Cirrhosis (HCC) 11/06/2014: DDD (degenerative disc disease), cervical 11/06/2014: DDD (degenerative disc disease), lumbar No date: Depression 04/08/2013: Disorder of  peripheral nervous system     Comment:  Overview:  severe in feet  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     Comment:  face, numerous lesions removed  No date: Erythrocytosis 12/07/2014: Esophagitis, reflux No date: GERD (gastroesophageal reflux disease)     Comment:  had surgery 12/07/2014: HCV (hepatitis C virus) No date: Hepatitis C     Comment:  DID HARVONI TREATMENT/ NO LONGER HEP C POSITIVE No date: History of kidney stones 03/01/2013: Idiopathic scoliosis and kyphoscoliosis No date: Leukopenia No date: Neuropathy of both feet No date: Painful legs and moving toes     Comment:  DUE TO BACK PROBLEMS No date: Prostatitis 11/06/2014: Sacroiliac joint dysfunction No date: Sleep apnea     Comment:  does not use cpap 12/07/2014: Spinal stenosis 11/04/2011: Testicular dysfunction     Comment:  Overview:  Dr. [name] is his urologist.   Past Surgical History: 04/14/2013: BACK SURGERY     Comment:  L2-S1 No date: Cancer of right ear     Comment:  3/17 06/10/1987: CATARACT EXTRACTION; Left No date: CERVICAL SPINE SURGERY     Comment:   X2 FUSION C4-7 07/10/2013: CHOLECYSTECTOMY 12/18/2014: COLONOSCOPY WITH PROPOFOL; N/A     Comment:  Procedure: COLONOSCOPY WITH PROPOFOL;  Surgeon: [name], MD;  Location: [facility];  Service:               Endoscopy;  Laterality: N/A; 09/23/2021: COLONOSCOPY WITH PROPOFOL;  N/A     Comment:  Procedure: COLONOSCOPY WITH PROPOFOL;  Surgeon:               [name], MD;  Location: [facility];                Service: Endoscopy;  Laterality: N/A; 04/02/2021: CYSTOSCOPY WITH INSERTION OF UROLIFT; N/A     Comment:  Procedure: CYSTOSCOPY WITH INSERTION OF UROLIFT;                Surgeon: [name], MD;  Location: [facility];                Service: Urology;  Laterality: N/A; 02/08/2012: ELBOW ARTHROSCOPY; Right No date: ELBOW SURGERY; Left 12/18/2014: ESOPHAGOGASTRODUODENOSCOPY (EGD) WITH PROPOFOL; N/A      Comment:  Procedure: ESOPHAGOGASTRODUODENOSCOPY (EGD) WITH               PROPOFOL with dialtion;  Surgeon: [name], MD;                Location: [facility];  Service: Endoscopy;                Laterality: N/A; 09/23/2021: ESOPHAGOGASTRODUODENOSCOPY (EGD) WITH PROPOFOL; N/A     Comment:  Procedure: ESOPHAGOGASTRODUODENOSCOPY (EGD) WITH               PROPOFOL;  Surgeon: [name], MD;  Location:               [facility];  Service: Endoscopy;  Laterality: N/A; 06/09/1968: FACIAL RECONSTRUCTION SURGERY     Comment:  BOTTLE CUT NOSE AND EYE, MULTIPLE SURGERIES 06/09/2013: FINGER SURGERY; Left     Comment:  THUMB 06/09/1964: FINGER SURGERY; Right     Comment:  MIDDLE 06/09/1982: FOOT SURGERY; Right 06/10/1999: FUNDIPLICATION 1981/1957: HERNIA REPAIR; Left     Comment:  RIGHT SIDE IN 1957 2021: JOINT REPLACEMENT; Left     Comment:  knee 02/10/2020: KNEE ARTHROPLASTY; Left     Comment:  Procedure: COMPUTER ASSISTED TOTAL KNEE ARTHROPLASTY -               RNFA;  Surgeon: Donato Heinz, MD;  Location: [facility];              Service: Orthopedics;  Laterality: Left; 08/14/2021: KNEE ARTHROSCOPY; Right     Comment:  Procedure: ARTHROSCOPY KNEE, medial menisectomy and               chondroplasty, loose bodies removal;  Surgeon: Donato Heinz, MD;  Location: [facility];  Service: Orthopedics;               Laterality: Right; 02/16/2015: MASS EXCISION; Left     Comment:  Procedure: LEFT BURSA ELBOW EXCISION;  Surgeon: Erin Sons, MD;  Location: [facility];  Service:               Orthopedics;  Laterality: Left; 06/10/1999: NISSEN FUNDOPLICATION 12/01/2002: NOSE SURGERY     Comment:  SEPTUM RECONSTRUCTION 12/18/2014: POLYPECTOMY     Comment:  Procedure: POLYPECTOMY;  Surgeon: [name], MD;                Location: [facility];  Service: Endoscopy;; 06/09/1997: SEPTOPLASTY 06/09/1981: SHOULDER SURGERY;  Right 06/09/1977: SHOULDER SURGERY; Left     Comment:  WITH ELBOW (TENDON RELEASE) 06/09/2002: SKIN SURGERY     Comment:  SKIN CANCER NOSE  BMI    Body Mass Index: 34.41 kg/m      Reproductive/Obstetrics negative OB ROS                             Anesthesia Physical Anesthesia Plan  ASA: 3  Anesthesia Plan: General ETT   Post-op Pain Management:    Induction: Intravenous  PONV Risk Score and Plan: Ondansetron, Dexamethasone, Midazolam and Treatment may vary due to age or medical condition  Airway Management Planned: Oral ETT  Additional Equipment:   Intra-op Plan:   Post-operative Plan: Extubation in OR  Informed Consent: I have reviewed the patients History and Physical, chart, labs and discussed the procedure including the risks, benefits and alternatives for the proposed anesthesia with the patient or authorized representative who has indicated his/her understanding and acceptance.     Dental Advisory Given  Plan Discussed with: Anesthesiologist, CRNA and Surgeon  Anesthesia Plan Comments: (Patient requests GA over spinal 2/2 his lumbar history.  Patient consented for risks of anesthesia including but not limited to:  - adverse reactions to medications - damage to eyes, teeth, lips or other oral mucosa - nerve damage due to positioning  - sore throat or hoarseness - Damage to heart, brain, nerves, lungs, other parts of body or loss of life  Patient voiced understanding.)       Anesthesia Quick Evaluation

## 2022-10-24 NOTE — Anesthesia Postprocedure Evaluation (Signed)
Anesthesia Post Note  Patient: [name]  Procedure(s) Performed: COMPUTER ASSISTED TOTAL KNEE ARTHROPLASTY (Right: Knee)  Patient location during evaluation: PACU Anesthesia Type: General Level of consciousness: awake and alert Pain management: pain level controlled Vital Signs Assessment: post-procedure vital signs reviewed and stable Respiratory status: spontaneous breathing, nonlabored ventilation, respiratory function stable and patient connected to nasal cannula oxygen Cardiovascular status: blood pressure returned to baseline and stable Postop Assessment: no apparent nausea or vomiting Anesthetic complications: no Comments: Pt had hypoxia in pacu. It is undetermined if hypoxia caused confusion, tremors, and agitation, or if pt had emergence delirium. Besides hypoxia, his other vital sign were stable with HR 90-100's. A duoneb was administered. Lungs diminished but CTAB. CXR showed small effusion and mild edema. ABG with mild respiratory acidosis. Supplemental oxygen was continued and patient's mentation improved. The Supplemental oxygen was weaned to low flow [place]. Pt was resting comfortably and discharged.   CXR: IMPRESSION: New vascular congestion with possible mild edema and small pleural effusions. Findings are suspicious for congestive heart failure.     No notable events documented.   Last Vitals:  Vitals:   10/24/22 1645 10/24/22 1700  BP: (!) 140/88   Pulse: 88 93  Resp: 15 18  Temp: 37.9 C   SpO2: 91% 94%    Last Pain:  Vitals:   10/24/22 1700  PainSc: 0-No pain                 Foye Deer

## 2022-10-24 NOTE — Progress Notes (Addendum)
Pt temp 100.7 in pacu with intermittent extremity tremors that are mild.  HR 100 and sinus.  Dr piscitello notified.  Also notified of sats 90-92 on 12 L simple mask.  Duoneb ordered per dr piscitello.     Also notified dr Ernest Pine of same.    1545-dr piscitello and johnson have come by bedside to see pt.    1555- dr Ernest Pine at bedside.  Per dr Ernest Pine pt has baseline tremors.      1630- dr Laural Benes back at bedside to check on pt status

## 2022-10-24 NOTE — Interval H&P Note (Signed)
History and Physical Interval Note:  10/24/2022 11:07 AM  [name]  has presented today for surgery, with the diagnosis of PRIMARY OSTEOARTHRITIS OF RIGHT KNEE.  The various methods of treatment have been discussed with the patient and family. After consideration of risks, benefits and other options for treatment, the patient has consented to  Procedure(s): COMPUTER ASSISTED TOTAL KNEE ARTHROPLASTY - RNFA (Right) as a surgical intervention.  The patient's history has been reviewed, patient examined, no change in status, stable for surgery.  I have reviewed the patient's chart and labs.  Questions were answered to the patient's satisfaction.     Yusuke Beza P Nivia Gervase

## 2022-10-25 DIAGNOSIS — G894 Chronic pain syndrome: Secondary | ICD-10-CM | POA: Diagnosis not present

## 2022-10-25 DIAGNOSIS — R911 Solitary pulmonary nodule: Secondary | ICD-10-CM | POA: Diagnosis not present

## 2022-10-25 DIAGNOSIS — Z96659 Presence of unspecified artificial knee joint: Secondary | ICD-10-CM | POA: Diagnosis not present

## 2022-10-25 DIAGNOSIS — M1711 Unilateral primary osteoarthritis, right knee: Secondary | ICD-10-CM | POA: Diagnosis not present

## 2022-10-25 MED ORDER — TRAMADOL HCL 50 MG PO TABS: 50.0000 mg | ORAL_TABLET | ORAL | 0 refills | Status: DC | PRN

## 2022-10-25 MED ORDER — CELECOXIB 200 MG PO CAPS: 200.0000 mg | ORAL_CAPSULE | Freq: Two times a day (BID) | ORAL | 1 refills | Status: AC

## 2022-10-25 MED ORDER — OXYCODONE HCL 5 MG PO TABS: 5.0000 mg | ORAL_TABLET | ORAL | 0 refills | Status: DC | PRN

## 2022-10-25 MED ORDER — ENOXAPARIN SODIUM 40 MG/0.4ML IJ SOSY: 40.0000 mg | PREFILLED_SYRINGE | INTRAMUSCULAR | 0 refills | Status: DC

## 2022-10-25 NOTE — Plan of Care (Signed)
  Problem: Activity: Goal: Ability to avoid complications of mobility impairment will improve Outcome: Progressing Goal: Range of joint motion will improve Outcome: Progressing   Problem: Pain Management: Goal: Pain level will decrease with appropriate interventions Outcome: Progressing   Problem: Skin Integrity: Goal: Will show signs of wound healing Outcome: Progressing   Problem: Activity: Goal: Risk for activity intolerance will decrease Outcome: Progressing   Problem: Safety: Goal: Ability to remain free from injury will improve Outcome: Progressing

## 2022-10-25 NOTE — Progress Notes (Signed)
Subjective: 1 Day Post-Op Procedure(s) (LRB): COMPUTER ASSISTED TOTAL KNEE ARTHROPLASTY (Right) Patient reports pain as mild.   Patient is well, and has had no acute complaints or problems Plan is to go Home after hospital stay. Negative for chest pain and shortness of breath Fever: no Gastrointestinal: Negative for nausea and vomiting  Objective: Vital signs in last 24 hours: Temp:  [97.5 F (36.4 C)-100.7 F (38.2 C)] 97.6 F (36.4 C) (05/18 0422) Pulse Rate:  [64-103] 64 (05/18 0422) Resp:  [14-20] 18 (05/18 0422) BP: (112-155)/(73-101) 112/73 (05/18 0422) SpO2:  [90 %-96 %] 94 % (05/18 0422) Weight:  [105.7 kg] 105.7 kg (05/17 0952)  Intake/Output from previous day:  Intake/Output Summary (Last 24 hours) at 10/25/2022 0645 Last data filed at 10/24/2022 2341 Gross per 24 hour  Intake 1240 ml  Output 1190 ml  Net 50 ml    Intake/Output this shift: Total I/O In: 240 [P.O.:240] Out: 460 [Urine:300; Drains:160]  Labs: No results for input(s): "HGB" in the last 72 hours. No results for input(s): "WBC", "RBC", "HCT", "PLT" in the last 72 hours. No results for input(s): "NA", "K", "CL", "CO2", "BUN", "CREATININE", "GLUCOSE", "CALCIUM" in the last 72 hours. No results for input(s): "LABPT", "INR" in the last 72 hours.   EXAM General - Patient is Alert and Oriented Extremity - Neurovascular intact Sensation intact distally Dorsiflexion/Plantar flexion intact Compartment soft Dressing/Incision - clean, dry, with the Hemovac removed without complication.  The Hemovac tubing was intact on removal. Motor Function - intact, moving foot and toes well on exam.  Able to straight leg raise independently  Past Medical History:  Diagnosis Date   Actinic keratosis    Anxiety    Arthritis    Back pain    MULTIPLE BACK SURGERIES   Basal cell carcinoma 10/09/2016   L sup med scapula    Basal cell carcinoma 07/11/2019   L lat forehead above lat brow    Basal cell carcinoma  08/29/2020   R prox medial pretibial, EDC   Basal cell carcinoma 08/29/2020   R posterior ear, EDC   BCC (basal cell carcinoma of skin) 12/27/2020   R epigastric - ED&C   Benign essential HTN 12/07/2014   Last Assessment & Plan:  Relevant Hx: Course: Daily Update: Today's Plan: CONTROLLED ON MEDS   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/31/2013   Bilateral sciatica    Chronic back pain    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   Cirrhosis (HCC)    DDD (degenerative disc disease), cervical 11/06/2014   DDD (degenerative disc disease), lumbar 11/06/2014   Depression    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   Overview:  severe in feet    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 [name] is his urologist.  Assessment/Plan: 1 Day Post-Op Procedure(s) (LRB): COMPUTER ASSISTED TOTAL KNEE ARTHROPLASTY (Right) Principal Problem:   Total knee replacement status  Estimated body mass index is 34.41 kg/m as calculated from the following:   Height as of this encounter: 5\' 9"  (1.753 m).   Weight as of this encounter: 105.7 kg. Advance diet Up with therapy D/C IV fluids Discharge home with home health  DVT Prophylaxis - Lovenox, Foot Pumps, and TED hose Weight-Bearing as tolerated to right leg  Dedra Skeens, PA-C Orthopaedic Surgery 10/25/2022, 6:45 AM

## 2022-10-25 NOTE — Evaluation (Signed)
Physical Therapy Evaluation Patient Details Name: [name] MRN: [id number] DOB: [birth year] Today's Date: 10/25/2022  History of Present Illness  71yo male s/p R TKA 10/24/22. PMHx includes L TKA 02/2020.  Clinical Impression  Patient received in bed, he is agreeable to PT assessment. Patient is mod I with bed mobility, transfers with supervision and ambulated 200 feet with RW and supervision. Ambulated up/down 4 steps with cues and supervision.  Exercise handout given and performed in supine. He will continue to benefit from skilled PT to maximize independent, strength and ROM.         Recommendations for follow up therapy are one component of a multi-disciplinary discharge planning process, led by the attending physician.  Recommendations may be updated based on patient status, additional functional criteria and insurance authorization.  Follow Up Recommendations       Assistance Recommended at Discharge Intermittent Supervision/Assistance  Patient can return home with the following  A little help with walking and/or transfers;A little help with bathing/dressing/bathroom;Assist for transportation;Assistance with cooking/housework;Help with stairs or ramp for entrance    Equipment Recommendations None recommended by PT  Recommendations for Other Services       Functional Status Assessment Patient has had a recent decline in their functional status and demonstrates the ability to make significant improvements in function in a reasonable and predictable amount of time.     Precautions / Restrictions Precautions Precautions: Fall Precaution Booklet Issued: Yes (comment) Restrictions Weight Bearing Restrictions: Yes RLE Weight Bearing: Weight bearing as tolerated      Mobility  Bed Mobility Overal bed mobility: Modified Independent                  Transfers Overall transfer level: Needs assistance Equipment used: Rolling walker (2 wheels) Transfers: Sit  to/from Stand Sit to Stand: Supervision           General transfer comment: VC for hand placement to improve tehcniquie    Ambulation/Gait Ambulation/Gait assistance: Supervision Gait Distance (Feet): 200 Feet Assistive device: Rolling walker (2 wheels) Gait Pattern/deviations: Step-to pattern, Decreased step length - right, Decreased step length - left Gait velocity: decr     General Gait Details: steady with use of RW  Stairs Stairs: Yes Stairs assistance: Supervision Stair Management: Step to pattern, Forwards, Two rails Number of Stairs: 4 General stair comments: safe on steps, cues for sequencing  Wheelchair Mobility    Modified Rankin (Stroke Patients Only)       Balance Overall balance assessment: Modified Independent                                           Pertinent Vitals/Pain Pain Assessment Pain Assessment: 0-10 Pain Score: 5  Pain Location: R knee Pain Descriptors / Indicators: Discomfort, Sore Pain Intervention(s): Monitored during session, Repositioned    Home Living Family/patient expects to be discharged to:: Private residence Living Arrangements: Spouse/significant other Available Help at Discharge: Family;Available 24 hours/day Type of Home: House Home Access: Stairs to enter Entrance Stairs-Rails: None Entrance Stairs-Number of Steps: 1   Home Layout: One level Home Equipment: Shower seat - built Char[employer] fundraiser (2 wheels);Grab bars - tub/shower;Adaptive equipment;Cane - quad      Prior Function Prior Level of Function : Driving;Independent/Modified Independent             Mobility Comments: PRN QC use in community 2/2 back pain ADLs  Comments: indep but limited standing tolerance     Hand Dominance        Extremity/Trunk Assessment   Upper Extremity Assessment Upper Extremity Assessment: Defer to OT evaluation    Lower Extremity Assessment Lower Extremity Assessment: RLE deficits/detail RLE  Deficits / Details: expected post-op strength/ROM deficits; able to complete SLR independently RLE Sensation: history of peripheral neuropathy    Cervical / Trunk Assessment Cervical / Trunk Assessment: Normal  Communication   Communication: No difficulties  Cognition Arousal/Alertness: Awake/alert Behavior During Therapy: WFL for tasks assessed/performed Overall Cognitive Status: Within Functional Limits for tasks assessed                                          General Comments      Exercises Total Joint Exercises Ankle Circles/Pumps: AROM, Right, 10 reps Quad Sets: AROM, Right, 10 reps Short Arc Quad: AROM, Right, 10 reps Heel Slides: AROM, Right, 10 reps Hip ABduction/ADduction: AROM, Right, 10 reps Straight Leg Raises: AROM, Right, 10 reps Goniometric ROM: 0-90 grossly   Assessment/Plan    PT Assessment Patient needs continued PT services  PT Problem List Decreased mobility;Decreased strength;Pain;Impaired sensation;Decreased range of motion       PT Treatment Interventions DME instruction;Gait training;Stair training;Functional mobility training;Therapeutic activities;Patient/family education;Neuromuscular re-education;Balance training;Therapeutic exercise;Modalities    PT Goals (Current goals can be found in the Care Plan section)  Acute Rehab PT Goals Patient Stated Goal: return home PT Goal Formulation: With patient/family Time For Goal Achievement: 10/26/22 Potential to Achieve Goals: Good    Frequency BID     Co-evaluation               AM-PAC PT "6 Clicks" Mobility  Outcome Measure Help needed turning from your back to your side while in a flat bed without using bedrails?: None Help needed moving from lying on your back to sitting on the side of a flat bed without using bedrails?: None Help needed moving to and from a bed to a chair (including a wheelchair)?: A Little Help needed standing up from a chair using your arms (e.g.,  wheelchair or bedside chair)?: A Little Help needed to walk in hospital room?: A Little Help needed climbing 3-5 steps with a railing? : A Little 6 Click Score: 20    End of Session Equipment Utilized During Treatment: Gait belt Activity Tolerance: Patient tolerated treatment well Patient left: in bed;with call bell/phone within reach;with family/visitor present Nurse Communication: Mobility status PT Visit Diagnosis: Muscle weakness (generalized) (M62.81);Difficulty in walking, not elsewhere classified (R26.2);Pain Pain - Right/Left: Right Pain - part of body: Knee    Time: 1000-1024 PT Time Calculation (min) (ACUTE ONLY): 24 min   Charges:   PT Evaluation $PT Eval Moderate Complexity: 1 Mod PT Treatments $Gait Training: 8-22 mins        Mylon Mabey, PT, GCS 10/25/22,10:33 AM

## 2022-10-25 NOTE — Evaluation (Signed)
Occupational Therapy Evaluation Patient Details Name: [name] MRN: [id number] DOB: [birth year] Today's Date: 10/25/2022   History of Present Illness 71yo male s/p R TKA 10/24/22. PMHx includes L TKA 02/2020.   Clinical Impression   Pt seen for OT evaluation this date, POD#1 from above surgery. Pt was independent in all ADL prior to surgery and occasionally requiring use of QC for community mobility 2/2 back pain. Pt is eager to return to PLOF with less pain and improved safety and independence. Pt currently requires PRN minimal assist for LB dressing and bathing while in seated position due to pain and limited AROM of R knee and chronic low back pain. Pt/spouse instructed in polar care mgt, falls prevention strategies, home/routines modifications, DME/AE for LB bathing and dressing tasks, pet care considerations, and compression stocking mgt. Handout provided to support recall and carryover. All education/training provided at time of evaluation. No additional skilled OT needs at this time. Pt/spouse in agreement. Will sign off.      Recommendations for follow up therapy are one component of a multi-disciplinary discharge planning process, led by the attending physician.  Recommendations may be updated based on patient status, additional functional criteria and insurance authorization.   Assistance Recommended at Discharge PRN  Patient can return home with the following A little help with bathing/dressing/bathroom;Assistance with cooking/housework;Assist for transportation    Functional Status Assessment  Patient has had a recent decline in their functional status and demonstrates the ability to make significant improvements in function in a reasonable and predictable amount of time.  Equipment Recommendations  BSC/3in1    Recommendations for Other Services       Precautions / Restrictions Precautions Precautions: Fall;Knee Precaution Booklet Issued: Yes  (comment) Restrictions Weight Bearing Restrictions: Yes RLE Weight Bearing: Weight bearing as tolerated      Mobility Bed Mobility Overal bed mobility: Modified Independent                  Transfers Overall transfer level: Needs assistance Equipment used: Rolling walker (2 wheels) Transfers: Sit to/from Stand Sit to Stand: Supervision           General transfer comment: VC for hand placement to improve tehcniquie      Balance Overall balance assessment: Mild deficits observed, not formally tested                                         ADL either performed or assessed with clinical judgement   ADL                                         General ADL Comments: Pt requires PRN MIN A for LB ADL tasks. Spouse able to provide needed level of assist.     Vision         Perception     Praxis      Pertinent Vitals/Pain Pain Assessment Pain Assessment: 0-10 Pain Score: 4  Pain Location: R knee Pain Descriptors / Indicators: Aching Pain Intervention(s): Limited activity within patient's tolerance, Monitored during session, Premedicated before session, Repositioned, Ice applied     Hand Dominance     Extremity/Trunk Assessment Upper Extremity Assessment Upper Extremity Assessment: Overall WFL for tasks assessed   Lower Extremity Assessment Lower Extremity Assessment: Defer to PT evaluation;RLE deficits/detail  RLE Deficits / Details: expected post-op strength/ROM deficits; able to complete SLR independently RLE Sensation: WNL       Communication Communication Communication: No difficulties   Cognition Arousal/Alertness: Awake/alert Behavior During Therapy: WFL for tasks assessed/performed Overall Cognitive Status: Within Functional Limits for tasks assessed                                       General Comments       Exercises Other Exercises Other Exercises: Pt/spouse educated in polar care  mgt, compression stocking mgt, falls prevention, AE/DME, home/routines modifications, and pet care considerations. Handout  provided.   Shoulder Instructions      Home Living Family/patient expects to be discharged to:: Private residence Living Arrangements: Spouse/significant other Available Help at Discharge: Family;Available 24 hours/day Type of Home: House Home Access: Stairs to enter [employer] of Steps: 1 Entrance Stairs-Rails: None Home Layout: One level     Bathroom Shower/Tub: Producer, television/film/video: Handicapped height     Home Equipment: Shower seat - built Charity fundraiser (2 wheels);Grab bars - tub/shower;Adaptive equipment;Cane - quad Adaptive Equipment: Reacher;Long-handled shoe horn        Prior Functioning/Environment Prior Level of Function : Driving;Independent/Modified Independent             Mobility Comments: PRN QC use in community 2/2 back pain ADLs Comments: indep but limited standing tolerance        OT Problem List: Decreased strength;Pain;Decreased range of motion      OT Treatment/Interventions:      OT Goals(Current goals can be found in the care plan section) Acute Rehab OT Goals Patient Stated Goal: go home and get better for my back surgery OT Goal Formulation: All assessment and education complete, DC therapy  OT Frequency:      Co-evaluation              AM-PAC OT "6 Clicks" Daily Activity     Outcome Measure Help from another person eating meals?: None Help from another person taking care of personal grooming?: None Help from another person toileting, which includes using toliet, bedpan, or urinal?: None Help from another person bathing (including washing, rinsing, drying)?: A Little Help from another person to put on and taking off regular upper body clothing?: None Help from another person to put on and taking off regular lower body clothing?: A Little 6 Click Score: 22   End of Session  Equipment Utilized During Treatment: Gait belt;Rolling walker (2 wheels)  Activity Tolerance: Patient tolerated treatment well Patient left: in bed;with call bell/phone within reach  OT Visit Diagnosis: Other abnormalities of gait and mobility (R26.89);Pain Pain - Right/Left: Right Pain - part of body: Knee                Time: 0911-0950 OT Time Calculation (min): 39 min Charges:  OT General Charges $OT Visit: 1 Visit OT Evaluation $OT Eval Low Complexity: 1 Low OT Treatments $Self Care/Home Management : 23-37 mins  Arman Filter., MPH, MS, OTR/L ascom [phone] 10/25/22, 10:02 AM

## 2022-10-25 NOTE — Discharge Summary (Signed)
Physician Discharge Summary  Subjective: 1 Day Post-Op Procedure(s) (LRB): COMPUTER ASSISTED TOTAL KNEE ARTHROPLASTY (Right) Patient reports pain as mild.   Patient seen in rounds with Dr. Ernest Pine. Patient is well, and has had no acute complaints or problems Patient is ready to go home with home health physical therapy  Physician Discharge Summary  Patient ID: [name] MRN: [id number] DOB/AGE: July 03, 1950 72 y.o.  Admit date: 10/24/2022 Discharge date: 10/25/2022  Admission Diagnoses:  Discharge Diagnoses:  Principal Problem:   Total knee replacement status   Discharged Condition: fair  Hospital Course: Patient is postop day 1 from a right total knee arthroplasty.  He is doing well since surgery.  His pain is manageable.  The patient had his drain removed this morning.  He has ambulated to the bathroom.  He will do physical therapy this morning and we will plan on going home with home health physical therapy today.  Treatments: surgery:   Right total knee arthroplasty using computer-assisted navigation   SURGEON:  Jena Gauss. M.D.   ASSISTANT:  Gean Birchwood, PA-C (present and scrubbed throughout the case, critical for assistance with exposure, retraction, instrumentation, and closure)   ANESTHESIA: general   ESTIMATED BLOOD LOSS: 50 mL   FLUIDS REPLACED: 1000 mL of crystalloid   TOURNIQUET TIME: 92 minutes   DRAINS: 2 medium Hemovac drains   SOFT TISSUE RELEASES: Anterior cruciate ligament, posterior cruciate ligament, deep medial collateral ligament, patellofemoral ligament   IMPLANTS UTILIZED: DePuy Attune size 6 posterior stabilized femoral component (cemented), size 8 rotating platform tibial component (cemented), 38 mm medialized dome patella (cemented), and a 5 mm stabilized rotating platform polyethylene insert.  Discharge Exam: Blood pressure 112/73, pulse 64, temperature 97.6 F (36.4 C), resp. rate 18, height 5\' 9"  (1.753 m), weight 105.7 kg,  SpO2 94 %.   Disposition: Discharge disposition: 01-Home or Self Care        Allergies as of 10/25/2022       Reactions   Gabapentin Other (See Comments)   Severe shakiness   Lamotrigine Other (See Comments)   falls        Medication List     TAKE these medications    amLODipine 10 MG tablet Commonly known as: NORVASC Take 10 mg by mouth every morning.   amoxicillin 500 MG capsule Commonly known as: AMOXIL Take 2,000 mg by mouth See admin instructions. 1 hour prior to dental procedures   ARIPiprazole 2 MG tablet Commonly known as: ABILIFY Take 2 mg by mouth every morning.   celecoxib 200 MG capsule Commonly known as: CELEBREX Take 1 capsule (200 mg total) by mouth 2 (two) times daily.   clonazePAM 0.5 MG tablet Commonly known as: KLONOPIN Take 0.5-1 mg by mouth 2 (two) times daily as needed for anxiety.   cyanocobalamin 1000 MCG tablet Commonly known as: VITAMIN B12 Take 2,000 mcg by mouth daily.   DULoxetine 60 MG capsule Commonly known as: CYMBALTA 60 mg every morning.   enoxaparin 40 MG/0.4ML injection Commonly known as: LOVENOX Inject 0.4 mLs (40 mg total) into the skin daily for 14 days.   fluticasone 50 MCG/ACT nasal spray Commonly known as: FLONASE Place 2 sprays into both nostrils as needed for allergies or rhinitis.   furosemide 40 MG tablet Commonly known as: LASIX Take 40 mg by mouth daily as needed for fluid.   ipratropium 0.03 % nasal spray Commonly known as: ATROVENT Place 2 sprays into both nostrils 2 (two) times daily.   losartan  25 MG tablet Commonly known as: COZAAR Take 25 mg by mouth daily.   Lumify 0.025 % Soln Generic drug: Brimonidine Tartrate Place 1 drop into both eyes 2 (two) times daily as needed (redness).   multivitamin with minerals Tabs tablet Take 1 tablet by mouth daily.   NEEDLE (DISP) 23 G 23G X 1" Misc Use 1 Units every 10 (ten) days.   oxyCODONE 5 MG immediate release tablet Commonly known as:  Oxy IR/ROXICODONE Take 1 tablet (5 mg total) by mouth every 4 (four) hours as needed for moderate pain (pain score 4-6).   sodium fluoride 1.1 % Gel dental gel Commonly known as: FLUORISHIELD Place 1 application onto teeth daily.   spironolactone 25 MG tablet Commonly known as: ALDACTONE Take 25 mg by mouth every morning.   testosterone cypionate 200 MG/ML injection Commonly known as: DEPOTESTOSTERONE CYPIONATE ADMINISTER 0.5 ML IN THE MUSCLE 1 TIME A WEEK What changed:  how much to take how to take this when to take this   traMADol 50 MG tablet Commonly known as: ULTRAM Take 1-2 tablets (50-100 mg total) by mouth every 4 (four) hours as needed for moderate pain.   traZODone 100 MG tablet Commonly known as: DESYREL Take 100 mg by mouth at bedtime.   VITAMIN D-3 PO Take 1 tablet by mouth daily at 6 (six) AM.               Durable Medical Equipment  (From admission, onward)           Start     Ordered   10/24/22 1724  DME Walker rolling  Once       Question:  Patient needs a walker to treat with the following condition  Answer:  Total knee replacement status   10/24/22 1723   10/24/22 1724  DME Bedside commode  Once       Comments: Patient is not able to walk the distance required to go the bathroom, or he/she is unable to safely negotiate stairs required to access the bathroom.  A 3in1 BSC will alleviate this problem  Question:  Patient needs a bedside commode to treat with the following condition  Answer:  Total knee replacement status   10/24/22 1723            Follow-up Information     Dedra Skeens, PA-C Follow up on 11/06/2022.   Specialty: Orthopedic Surgery Why: at 9:45am Contact information: [address] [place] [address] [phone]         Donato Heinz, MD Follow up on 12/09/2022.   Specialty: Orthopedic Surgery Why: at 3:00pm Contact information: [address] [facility] [place]  [place] [address] [phone]                 Signed: Lenard Forth, Merrit Friesen 10/25/2022, 6:49 AM   Objective: Vital signs in last 24 hours: Temp:  [97.5 F (36.4 C)-100.7 F (38.2 C)] 97.6 F (36.4 C) (05/18 0422) Pulse Rate:  [64-103] 64 (05/18 0422) Resp:  [14-20] 18 (05/18 0422) BP: (112-155)/(73-101) 112/73 (05/18 0422) SpO2:  [90 %-96 %] 94 % (05/18 0422) Weight:  [105.7 kg] 105.7 kg (05/17 0952)  Intake/Output from previous day:  Intake/Output Summary (Last 24 hours) at 10/25/2022 0649 Last data filed at 10/24/2022 2341 Gross per 24 hour  Intake 1240 ml  Output 1190 ml  Net 50 ml    Intake/Output this shift: Total I/O In: 240 [P.O.:240] Out: 460 [Urine:300; Drains:160]  Labs: No results for input(s): "HGB" in the last 72 hours. No results for input(s): "WBC", "RBC", "HCT", "PLT" in the last 72 hours. No results for input(s): "NA", "K", "CL", "CO2", "BUN", "CREATININE", "GLUCOSE", "CALCIUM" in the last 72 hours. No results for input(s): "LABPT", "INR" in the last 72 hours.  EXAM: General - Patient is Alert and Oriented Extremity - Neurovascular intact Sensation intact distally Dorsiflexion/Plantar flexion intact Incision - clean, dry, with the Hemovac removed with no complication Motor Function -plantarflexion and dorsiflexion are intact.  Able to straight leg raise independently.  Assessment/Plan: 1 Day Post-Op Procedure(s) (LRB): COMPUTER ASSISTED TOTAL KNEE ARTHROPLASTY (Right) Procedure(s) (LRB): COMPUTER ASSISTED TOTAL KNEE ARTHROPLASTY (Right) Past Medical History:  Diagnosis Date   Actinic keratosis    Anxiety    Arthritis    Back pain    MULTIPLE BACK SURGERIES   Basal cell carcinoma 10/09/2016   L sup med scapula    Basal cell carcinoma 07/11/2019   L lat forehead above lat brow    Basal cell carcinoma 08/29/2020   R prox medial pretibial, EDC   Basal cell carcinoma 08/29/2020   R posterior ear, EDC   BCC (basal cell carcinoma of skin) 12/27/2020    R epigastric - ED&C   Benign essential HTN 12/07/2014   Last Assessment & Plan:  Relevant Hx: Course: Daily Update: Today's Plan: CONTROLLED ON MEDS   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/31/2013   Bilateral sciatica    Chronic back pain    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   Cirrhosis (HCC)    DDD (degenerative disc disease), cervical 11/06/2014   DDD (degenerative disc disease), lumbar 11/06/2014   Depression    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   Overview:  severe in feet    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 [name] is his urologist.    Principal Problem:   Total knee replacement status  Estimated body mass index is 34.41 kg/m as calculated from the following:   Height as of this encounter: 5\' 9"  (1.753 m).   Weight as of this encounter: 105.7 kg. Advance diet Up with therapy D/C IV fluids Discharge home with home health Diet - Regular diet Follow up - in 2 weeks Activity - WBAT Disposition - Home Condition Upon Discharge - Stable DVT Prophylaxis - Lovenox and TED hose  Dedra Skeens, PA-C Orthopaedic Surgery 10/25/2022, 6:49 AM

## 2022-10-25 NOTE — TOC Initial Note (Signed)
Transition of Care [facility]) - Initial/Assessment Note    Patient Details  Name: [name] MRN: [id number] Date of Birth: [birth year]  Transition of [facility]) CM/SW Contact:    Luvenia Redden, RN Phone Number: [phone] 10/25/2022, 10:20 AM  Clinical Narrative:                 Recommendations for the Bedside commode. Patient is not able to walk the distance required to go the bathroom, or he/she is unable to safely negotiate stairs required to access the bathroom. A BSC will alleviate this problem.  TOC RN spoke with the spouse who was receptive to the DME 3-1 delivery through [employer] be delivered directly to the pt's bedside. Pt utilizes [employer] for his medications and is able to afford his medications. Wife indicates she will be transporting pt home today and all his appointments. No other needs presented at this time.  PCP: Dr. Rolan Lipa [facility]).   Risk Evaluation and Mitigation Strategies (REMS)  The medication you are ordering is associated with Risk Evaluation and Mitigation Strategies (REMS) and has Elements to [employer] Use (ETASU).   Please use the link below to the FDA website to assure all ETASU are addressed for this medication.  [email or web address]    Risk Evaluation and Mitigation Strategies (REMS)  The medication you are ordering is associated with Risk Evaluation and Mitigation Strategies (REMS) and has Elements to [employer] Use (ETASU).   Please use the link below to the FDA website to assure all ETASU are addressed for this medication.  [email or web address]   Expected Discharge Plan: Home/Self Care Barriers to Discharge: No Barriers Identified   Patient Goals and CMS Choice   CMS Medicare.gov Compare Post Acute Care list provided to:: Patient Represenative (must comment) (Spouse) Choice offered to / list presented to : Patient, Spouse       Expected Discharge Plan and Services   Discharge Planning Services: CM Consult   Living arrangements for the past 2 months: Single Family Home Expected Discharge Date: 10/25/22               DME Arranged: Bedside commode DME Agency: [employer] Date DME Agency Contacted: 10/25/22 Time DME Agency Contacted: [id number] Representative spoke with at DME Agency: Leavy Cella            Prior Living Arrangements/Services Living arrangements for the past 2 months: Single Family Home Lives with:: Spouse Patient language and need for interpreter reviewed:: Yes Do you feel safe going back to the place where you live?: Yes      Need for Family Participation in Patient Care: Yes (Comment) Care giver support system in place?: Yes (comment)      Activities of Daily Living Home Assistive Devices/Equipment: Eyeglasses ADL Screening (condition at time of admission) Patient's cognitive ability adequate to safely complete daily activities?: Yes Is the patient deaf or have difficulty hearing?: No Does the patient have difficulty seeing, even when wearing glasses/contacts?: No Does the patient have difficulty concentrating, remembering, or making decisions?: No Patient able to express need for assistance with ADLs?: Yes Does the patient have difficulty dressing or bathing?: No Independently performs ADLs?: Yes (appropriate for developmental age) Does the patient have difficulty walking or climbing stairs?: Yes Weakness of Legs: None Weakness of Arms/Hands: None  Permission Sought/Granted   Permission granted to share information with : Yes, Verbal Permission Granted              Emotional Assessment Appearance:: Appears stated age  Attitude/Demeanor/Rapport: Engaged Affect (typically observed): Accepting Orientation: : Oriented to Self, Oriented to Place, Oriented to  Time, Oriented to Situation   Psych Involvement: No (comment)  Admission diagnosis:  Total knee replacement status  [Z96.659] Patient Active Problem List   Diagnosis Date Noted   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   Pulmonary nodule seen on imaging study 06/05/2022   S/P right knee arthroscopy 08/28/2021   Chronic pain disorder 10/18/2020   Leukopenia 03/08/2020   Vitamin B12 deficiency 03/08/2020   Vitamin D deficiency 03/08/2020   Short-term memory loss 02/29/2020   S/P total knee arthroplasty, left 02/29/2020   Primary osteoarthritis of right knee 12/18/2019   Skin infection 09/19/2019   Hypogonadism in male 01/06/2019   Bilateral sciatica 11/12/2017   Anxiety and depression 11/05/2017   Obesity (BMI 30.0-34.9) 07/17/2017   Nephrolithiasis 06/10/2017   Hydronephrosis with urinary obstruction due to ureteral calculus 09/30/2016   Personal history of other infectious and parasitic diseases 12/14/2015   History of hepatitis C virus infection 12/14/2015   Olecranon bursitis of left elbow 03/13/2015   Other infective bursitis, left elbow 01/23/2015   Difficulty in swallowing    Gastritis    Ectopic gastric tissue    Hx of colonic polyps    Benign neoplasm of cecum    First degree hemorrhoids    Benign essential HTN 12/07/2014   HCV (hepatitis C virus) 12/07/2014   Apnea, sleep 12/07/2014   Spinal stenosis 12/07/2014   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   Skin cancer 12/07/2014   Secondary erythrocytosis 11/28/2014   DDD (degenerative disc disease), lumbar 11/06/2014   DDD (degenerative disc disease), cervical 11/06/2014   Status post cervical spinal fusion 11/06/2014   Status post lumbar laminectomy 11/06/2014   Sacroiliac joint dysfunction 11/06/2014   Encounter for long-term (current) use of medications 10/19/2013   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/31/2013   Incomplete emptying of bladder 03/31/2013   Retention of urine, unspecified 03/31/2013    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   Idiopathic scoliosis and kyphoscoliosis 03/01/2013   Elevated glucose 12/10/2011   Prediabetes 12/10/2011   Testicular dysfunction 11/04/2011   PCP:  [name], NP Pharmacy:   [facility] - [place], [place] - [address] [place] [address] Phone: [phone] Fax: [phone]     Social Determinants of Health (SDOH) Social History: SDOH Screenings   Food Insecurity: No Food Insecurity (10/24/2022)  Housing: Low Risk  (10/24/2022)  Transportation Needs: No Transportation Needs (10/24/2022)  Utilities: Not At Risk (10/24/2022)  Tobacco Use: Low Risk  (10/24/2022)   SDOH Interventions:     Readmission Risk Interventions     No data to display

## 2022-10-26 ENCOUNTER — Encounter: Payer: Self-pay | Admitting: Orthopedic Surgery

## 2022-10-26 DIAGNOSIS — G4730 Sleep apnea, unspecified: Secondary | ICD-10-CM | POA: Diagnosis not present

## 2022-10-26 DIAGNOSIS — N400 Enlarged prostate without lower urinary tract symptoms: Secondary | ICD-10-CM | POA: Diagnosis not present

## 2022-10-26 DIAGNOSIS — K7460 Unspecified cirrhosis of liver: Secondary | ICD-10-CM | POA: Diagnosis not present

## 2022-10-26 DIAGNOSIS — M109 Gout, unspecified: Secondary | ICD-10-CM | POA: Diagnosis not present

## 2022-10-26 DIAGNOSIS — M5030 Other cervical disc degeneration, unspecified cervical region: Secondary | ICD-10-CM | POA: Diagnosis not present

## 2022-10-26 DIAGNOSIS — B182 Chronic viral hepatitis C: Secondary | ICD-10-CM | POA: Diagnosis not present

## 2022-10-26 DIAGNOSIS — I10 Essential (primary) hypertension: Secondary | ICD-10-CM | POA: Diagnosis not present

## 2022-10-26 DIAGNOSIS — L570 Actinic keratosis: Secondary | ICD-10-CM | POA: Diagnosis not present

## 2022-10-26 DIAGNOSIS — M1990 Unspecified osteoarthritis, unspecified site: Secondary | ICD-10-CM | POA: Diagnosis not present

## 2022-10-26 DIAGNOSIS — Z96651 Presence of right artificial knee joint: Secondary | ICD-10-CM | POA: Diagnosis not present

## 2022-10-26 DIAGNOSIS — N132 Hydronephrosis with renal and ureteral calculous obstruction: Secondary | ICD-10-CM | POA: Diagnosis not present

## 2022-10-26 DIAGNOSIS — M5136 Other intervertebral disc degeneration, lumbar region: Secondary | ICD-10-CM | POA: Diagnosis not present

## 2022-10-26 DIAGNOSIS — M4800 Spinal stenosis, site unspecified: Secondary | ICD-10-CM | POA: Diagnosis not present

## 2022-10-26 DIAGNOSIS — F32A Depression, unspecified: Secondary | ICD-10-CM | POA: Diagnosis not present

## 2022-10-26 DIAGNOSIS — M4120 Other idiopathic scoliosis, site unspecified: Secondary | ICD-10-CM | POA: Diagnosis not present

## 2022-10-26 DIAGNOSIS — M5431 Sciatica, right side: Secondary | ICD-10-CM | POA: Diagnosis not present

## 2022-10-26 DIAGNOSIS — G5600 Carpal tunnel syndrome, unspecified upper limb: Secondary | ICD-10-CM | POA: Diagnosis not present

## 2022-10-26 DIAGNOSIS — Z471 Aftercare following joint replacement surgery: Secondary | ICD-10-CM | POA: Diagnosis not present

## 2022-10-26 DIAGNOSIS — D120 Benign neoplasm of cecum: Secondary | ICD-10-CM | POA: Diagnosis not present

## 2022-10-26 DIAGNOSIS — G629 Polyneuropathy, unspecified: Secondary | ICD-10-CM | POA: Diagnosis not present

## 2022-10-26 DIAGNOSIS — D72819 Decreased white blood cell count, unspecified: Secondary | ICD-10-CM | POA: Diagnosis not present

## 2022-10-26 DIAGNOSIS — F419 Anxiety disorder, unspecified: Secondary | ICD-10-CM | POA: Diagnosis not present

## 2022-10-26 DIAGNOSIS — E299 Testicular dysfunction, unspecified: Secondary | ICD-10-CM | POA: Diagnosis not present

## 2022-10-26 DIAGNOSIS — M5432 Sciatica, left side: Secondary | ICD-10-CM | POA: Diagnosis not present

## 2022-11-03 DIAGNOSIS — Z471 Aftercare following joint replacement surgery: Secondary | ICD-10-CM | POA: Diagnosis not present

## 2022-11-06 DIAGNOSIS — M25661 Stiffness of right knee, not elsewhere classified: Secondary | ICD-10-CM | POA: Diagnosis not present

## 2022-11-06 DIAGNOSIS — M6281 Muscle weakness (generalized): Secondary | ICD-10-CM | POA: Diagnosis not present

## 2022-11-06 DIAGNOSIS — Z96651 Presence of right artificial knee joint: Secondary | ICD-10-CM | POA: Diagnosis not present

## 2022-11-06 DIAGNOSIS — M25561 Pain in right knee: Secondary | ICD-10-CM | POA: Diagnosis not present

## 2022-11-11 ENCOUNTER — Inpatient Hospital Stay: Payer: HMO | Attending: Internal Medicine

## 2022-11-11 ENCOUNTER — Inpatient Hospital Stay: Payer: HMO

## 2022-11-11 DIAGNOSIS — M25561 Pain in right knee: Secondary | ICD-10-CM | POA: Diagnosis not present

## 2022-11-11 DIAGNOSIS — D751 Secondary polycythemia: Secondary | ICD-10-CM | POA: Diagnosis not present

## 2022-11-11 DIAGNOSIS — Z96651 Presence of right artificial knee joint: Secondary | ICD-10-CM | POA: Diagnosis not present

## 2022-11-11 DIAGNOSIS — M6281 Muscle weakness (generalized): Secondary | ICD-10-CM | POA: Diagnosis not present

## 2022-11-11 DIAGNOSIS — M25661 Stiffness of right knee, not elsewhere classified: Secondary | ICD-10-CM | POA: Diagnosis not present

## 2022-11-11 LAB — HEMOGLOBIN AND HEMATOCRIT (CANCER CENTER ONLY)
HCT: 48.4 % (ref 39.0–52.0)
Hemoglobin: 16.2 g/dL (ref 13.0–17.0)

## 2022-11-11 NOTE — Progress Notes (Signed)
Phlebotomy held hct 48.4

## 2022-11-13 DIAGNOSIS — M25561 Pain in right knee: Secondary | ICD-10-CM | POA: Diagnosis not present

## 2022-11-13 DIAGNOSIS — M25661 Stiffness of right knee, not elsewhere classified: Secondary | ICD-10-CM | POA: Diagnosis not present

## 2022-11-13 DIAGNOSIS — Z96651 Presence of right artificial knee joint: Secondary | ICD-10-CM | POA: Diagnosis not present

## 2022-11-13 DIAGNOSIS — M6281 Muscle weakness (generalized): Secondary | ICD-10-CM | POA: Diagnosis not present

## 2022-11-18 DIAGNOSIS — Z96651 Presence of right artificial knee joint: Secondary | ICD-10-CM | POA: Diagnosis not present

## 2022-11-18 DIAGNOSIS — M6281 Muscle weakness (generalized): Secondary | ICD-10-CM | POA: Diagnosis not present

## 2022-11-18 DIAGNOSIS — M25561 Pain in right knee: Secondary | ICD-10-CM | POA: Diagnosis not present

## 2022-11-18 DIAGNOSIS — M25661 Stiffness of right knee, not elsewhere classified: Secondary | ICD-10-CM | POA: Diagnosis not present

## 2022-11-20 DIAGNOSIS — M25661 Stiffness of right knee, not elsewhere classified: Secondary | ICD-10-CM | POA: Diagnosis not present

## 2022-11-20 DIAGNOSIS — M25561 Pain in right knee: Secondary | ICD-10-CM | POA: Diagnosis not present

## 2022-11-20 DIAGNOSIS — M6281 Muscle weakness (generalized): Secondary | ICD-10-CM | POA: Diagnosis not present

## 2022-11-20 DIAGNOSIS — Z96651 Presence of right artificial knee joint: Secondary | ICD-10-CM | POA: Diagnosis not present

## 2022-11-25 DIAGNOSIS — Z96651 Presence of right artificial knee joint: Secondary | ICD-10-CM | POA: Diagnosis not present

## 2022-11-25 DIAGNOSIS — Z09 Encounter for follow-up examination after completed treatment for conditions other than malignant neoplasm: Secondary | ICD-10-CM | POA: Diagnosis not present

## 2022-11-25 DIAGNOSIS — G894 Chronic pain syndrome: Secondary | ICD-10-CM | POA: Diagnosis not present

## 2022-11-25 DIAGNOSIS — F419 Anxiety disorder, unspecified: Secondary | ICD-10-CM | POA: Diagnosis not present

## 2022-11-25 DIAGNOSIS — G64 Other disorders of peripheral nervous system: Secondary | ICD-10-CM | POA: Diagnosis not present

## 2022-11-25 DIAGNOSIS — E538 Deficiency of other specified B group vitamins: Secondary | ICD-10-CM | POA: Diagnosis not present

## 2022-11-25 DIAGNOSIS — G4730 Sleep apnea, unspecified: Secondary | ICD-10-CM | POA: Diagnosis not present

## 2022-11-25 DIAGNOSIS — F32A Depression, unspecified: Secondary | ICD-10-CM | POA: Diagnosis not present

## 2022-11-25 DIAGNOSIS — E559 Vitamin D deficiency, unspecified: Secondary | ICD-10-CM | POA: Diagnosis not present

## 2022-11-25 DIAGNOSIS — I10 Essential (primary) hypertension: Secondary | ICD-10-CM | POA: Diagnosis not present

## 2022-11-25 DIAGNOSIS — G250 Essential tremor: Secondary | ICD-10-CM | POA: Diagnosis not present

## 2022-11-27 ENCOUNTER — Ambulatory Visit
Admission: RE | Admit: 2022-11-27 | Discharge: 2022-11-27 | Disposition: A | Discharge status: Home / Self Care | Payer: HMO | Source: Ambulatory Visit | Attending: Neurosurgery | Admitting: Neurosurgery

## 2022-11-27 DIAGNOSIS — G8929 Other chronic pain: Secondary | ICD-10-CM | POA: Insufficient documentation

## 2022-11-27 DIAGNOSIS — M4184 Other forms of scoliosis, thoracic region: Secondary | ICD-10-CM | POA: Diagnosis not present

## 2022-11-27 DIAGNOSIS — M5441 Lumbago with sciatica, right side: Secondary | ICD-10-CM | POA: Insufficient documentation

## 2022-11-27 DIAGNOSIS — M5135 Other intervertebral disc degeneration, thoracolumbar region: Secondary | ICD-10-CM | POA: Diagnosis not present

## 2022-11-27 DIAGNOSIS — M5442 Lumbago with sciatica, left side: Secondary | ICD-10-CM | POA: Insufficient documentation

## 2022-11-27 DIAGNOSIS — M5136 Other intervertebral disc degeneration, lumbar region: Secondary | ICD-10-CM | POA: Insufficient documentation

## 2022-11-27 DIAGNOSIS — M5124 Other intervertebral disc displacement, thoracic region: Secondary | ICD-10-CM | POA: Diagnosis not present

## 2022-11-27 DIAGNOSIS — M5134 Other intervertebral disc degeneration, thoracic region: Secondary | ICD-10-CM | POA: Diagnosis not present

## 2022-11-27 DIAGNOSIS — M4316 Spondylolisthesis, lumbar region: Secondary | ICD-10-CM | POA: Insufficient documentation

## 2022-11-27 DIAGNOSIS — M2578 Osteophyte, vertebrae: Secondary | ICD-10-CM | POA: Diagnosis not present

## 2022-11-27 DIAGNOSIS — M4310 Spondylolisthesis, site unspecified: Secondary | ICD-10-CM | POA: Diagnosis not present

## 2022-11-27 DIAGNOSIS — M79604 Pain in right leg: Secondary | ICD-10-CM | POA: Diagnosis not present

## 2022-11-27 DIAGNOSIS — M5126 Other intervertebral disc displacement, lumbar region: Secondary | ICD-10-CM | POA: Diagnosis not present

## 2022-11-27 DIAGNOSIS — M48061 Spinal stenosis, lumbar region without neurogenic claudication: Secondary | ICD-10-CM | POA: Diagnosis not present

## 2022-11-27 DIAGNOSIS — M4804 Spinal stenosis, thoracic region: Secondary | ICD-10-CM | POA: Diagnosis not present

## 2022-12-05 ENCOUNTER — Encounter: Payer: Self-pay | Admitting: Neurosurgery

## 2022-12-09 DIAGNOSIS — Z96651 Presence of right artificial knee joint: Secondary | ICD-10-CM | POA: Diagnosis not present

## 2022-12-16 ENCOUNTER — Ambulatory Visit (INDEPENDENT_AMBULATORY_CARE_PROVIDER_SITE_OTHER): Payer: HMO | Admitting: Neurosurgery

## 2022-12-16 ENCOUNTER — Encounter: Payer: Self-pay | Admitting: Neurosurgery

## 2022-12-16 VITALS — BP 132/82 | Ht 69.0 in | Wt 234.4 lb

## 2022-12-16 DIAGNOSIS — M5416 Radiculopathy, lumbar region: Secondary | ICD-10-CM

## 2022-12-16 DIAGNOSIS — G8929 Other chronic pain: Secondary | ICD-10-CM

## 2022-12-16 DIAGNOSIS — M5116 Intervertebral disc disorders with radiculopathy, lumbar region: Secondary | ICD-10-CM | POA: Diagnosis not present

## 2022-12-16 DIAGNOSIS — M4316 Spondylolisthesis, lumbar region: Secondary | ICD-10-CM | POA: Diagnosis not present

## 2022-12-16 DIAGNOSIS — M51369 Other intervertebral disc degeneration, lumbar region without mention of lumbar back pain or lower extremity pain: Secondary | ICD-10-CM

## 2022-12-16 DIAGNOSIS — M4804 Spinal stenosis, thoracic region: Secondary | ICD-10-CM

## 2022-12-16 DIAGNOSIS — M960 Pseudarthrosis after fusion or arthrodesis: Secondary | ICD-10-CM | POA: Diagnosis not present

## 2022-12-16 DIAGNOSIS — M4310 Spondylolisthesis, site unspecified: Secondary | ICD-10-CM

## 2022-12-16 NOTE — Progress Notes (Addendum)
Referring Physician:  [name], NP [address] [place],  [place] [address]  Primary Physician:  [name], NP  History of Present Illness: 12/16/2022  [name] returns to see me.  He had his right knee replaced on May 17 by Dr. Ernest Pine.  He continues to have severe symptoms as noted below.  Of note, he has been in physical therapy and was discharged due to lack of improvement.  He comes in today with worsening balance and low back symptoms.   10/21/2022 [name] is here today with a chief complaint of low back pain that radiates into the bilateral legs.  He has a long history of back pain but is been worsening recently.  His pain has been ongoing for multiple decades.  He had back surgery 5 years ago, and did well from that.  More recently, he has pain in the back of his legs as well as in his groin area that is worsened with walking and certain movements.  It can be very severe but is currently 5 out of 10.  Sitting down makes it better.  He tried physical therapy without improvement and was discharged.  He is at the point where this is impacting his day-to-day life.  He would like to consider what options are available for him.   Bowel/Bladder Dysfunction: none  Conservative measures:  Physical therapy: has participated in 2 visits at [facility] and was discharged.  Multimodal medical therapy including regular antiinflammatories:  celebrex Injections: has received epidural steroid injections 04/19/21: Bilateral S1 TF ESI (no relief) 03/29/21: Bilateral S1 TF ESI (no relief)  Past Surgery:  - lumbar laminectomy and facetectomy at [facility] in 2014 with Dr. [name]. Also with history of posterior cervical laminectomy and foraminotomy with Dr. [name]. He also had ACDF C3-C6 along with posterior cervical fusion C3-C6. - L3-S1 fusion in 2019 with Dr. Chilton Si has no symptoms of cervical myelopathy.  The symptoms are causing a  significant impact on the patient's life.   Progress Note from [name], [place] on 08/27/22:  History of Present Illness: [name] is a 72 y.o. male has a history of sleep apnea, skin CA, hep C, GERD, gout, HTN, peripheral neuropathy.    He has history of lumbar laminectomy and facetectomy at [facility] in 2014 with Dr. [name]. Also with history of posterior cervical laminectomy and foraminotomy with Dr. [name]. He also had ACDFC3-C6 along with posterior cervical fusion C3-C6.   He subsequently underwent L3-S1 fusion in 2019 with Dr. [name]. He has seen Dr. [name] in the past.    History of chronic LBP x years with bilateral leg pain in his thighs. He has known knee OA in right knee and painful left TKA. He also has known neuropathy in both arms/legs.    No change in his symptoms. He has constant LBP and intermittent left and right leg pain mostly into his thighs, sometimes into the calf. He notes intermittent spasms in his back and he has to sit down to rest when he has these. He can't walk more than a block or so because of his back pain.    Phone visit scheduled to review his lumbar xray results.   I have utilized the care everywhere function in epic to review the outside records available from external health systems.  Review of Systems:  A 10 point review of systems is negative, except for the pertinent positives and negatives  detailed in the HPI.  Past Medical History: Past Medical History:  Diagnosis Date   Actinic keratosis    Anxiety    Arthritis    Back pain    MULTIPLE BACK SURGERIES   Basal cell carcinoma 10/09/2016   L sup med scapula    Basal cell carcinoma 07/11/2019   L lat forehead above lat brow    Basal cell carcinoma 08/29/2020   R prox medial pretibial, ED[facility]   Basal cell carcinoma 08/29/2020   R posterior ear, [facility]   BCC (basal cell carcinoma of skin) 12/27/2020   R epigastric - ED&C   Benign essential HTN 12/07/2014   Last Assessment & Plan:   Relevant Hx: Course: Daily Update: Today's Plan: CONTROLLED ON MEDS   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/31/2013   Bilateral sciatica    Chronic back pain    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   Cirrhosis (HCC)    DDD (degenerative disc disease), cervical 11/06/2014   DDD (degenerative disc disease), lumbar 11/06/2014   Depression    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   Overview:  severe in feet    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 [name] is his urologist.     Past Surgical History: Past Surgical History:  Procedure Laterality Date   BACK SURGERY  04/14/2013   L2-S1   Cancer of right ear     3/17   CATARACT EXTRACTION Left 06/10/1987   CERVICAL SPINE SURGERY      X2 FUSION C4-7   CHOLECYSTECTOMY  07/10/2013   COLONOSCOPY WITH PROPOFOL N/A 12/18/2014   Procedure: COLONOSCOPY WITH PROPOFOL;  Surgeon: [name], MD;  Location: [facility];  Service: Endoscopy;  Laterality: N/A;   COLONOSCOPY WITH PROPOFOL N/A 09/23/2021   Procedure: COLONOSCOPY WITH PROPOFOL;  Surgeon: [name], MD;  Location: [facility] END[facility]COPY;  Service: Endoscopy;  Laterality: N/A;   CYSTOSCOPY WITH INSERTION OF UROLIFT N/A 04/02/2021   Procedure: CYSTOSCOPY WITH INSERTION OF UROLIFT;  Surgeon: [name], MD;  Location: [facility] ORS;  Service:  Urology;  Laterality: N/A;   ELBOW ARTHROSCOPY Right 02/08/2012   ELBOW SURGERY Left    ESOPHAGOGASTRODUODENOSCOPY (EGD) WITH PROPOFOL N/A 12/18/2014   Procedure: ESOPHAGOGASTRODUODENOSCOPY (EGD) WITH PROPOFOL with dialtion;  Surgeon: [name], MD;  Location: [facility];  Service: Endoscopy;  Laterality: N/A;   ESOPHAGOGASTRODUODENOSCOPY (EGD) WITH PROPOFOL N/A 09/23/2021   Procedure: ESOPHAGOGASTRODUODENOSCOPY (EGD) WITH PROPOFOL;  Surgeon: [name], MD;  Location: [facility] ENDOSCOPY;  Service: Endoscopy;  Laterality: N/A;   FACIAL RECONSTRUCTION SURGERY  06/09/1968   BOTTLE CUT NOSE AND EYE, MULTIPLE SURGERIES   FINGER SURGERY Left 06/09/2013   THUMB   FINGER SURGERY Right 06/09/1964   MIDDLE   FOOT SURGERY Right 06/09/1982   FUNDIPLICATION  06/10/1999   HERNIA REPAIR Left 1981/1957   RIGHT SIDE IN 1957   JOINT REPLACEMENT Left 2021   knee   KNEE ARTHROPLASTY Left 02/10/2020   Procedure: COMPUTER ASSISTED TOTAL KNEE ARTHROPLASTY - RNFA;  Surgeon: Donato Heinz, MD;  Location: [facility] OR[facility];  Service: Orthopedics;  Laterality: Left;   KNEE ARTHROPLASTY Right 10/24/2022   Procedure: COMPUTER ASSISTED TOTAL KNEE ARTHROPLASTY;  Surgeon: Donato Heinz, MD;  Location: [facility];  Service: Orthopedics;  Laterality: Right;   KNEE ARTHROSCOPY Right 08/14/2021   Procedure: ARTHROSCOPY KNEE, medial menisectomy and chondroplasty, loose bodies removal;  Surgeon: Donato Heinz, MD;  Location: [facility] OR[facility];  Service: Orthopedics;  Laterality: Right;   MASS EXCISION Left 02/16/2015   Procedure: LEFT BURSA ELBOW EXCISION;  Surgeon: Erin Sons, MD;  Location: [facility];  Service: Orthopedics;  Laterality: Left;   NISSEN FUNDOPLICATION  06/10/1999   NOSE SURGERY  12/01/2002   SEPTUM RECONSTRUCTION   POLYPECTOMY  12/18/2014   Procedure: POLYPECTOMY;  Surgeon: [name], MD;  Location: [facility];  Service: Endoscopy;;   SEPTOPLASTY  06/09/1997   SHOULDER SURGERY  Right 06/09/1981   SHOULDER SURGERY Left 06/09/1977   WITH ELBOW (TENDON RELEASE)   SKIN SURGERY  06/09/2002   SKIN CANCER NOSE    Allergies: Allergies as of 12/16/2022 - Review Complete 12/16/2022  Allergen Reaction Noted   Gabapentin Other (See Comments) 03/27/2022   Lamotrigine Other (See Comments) 03/27/2022    Medications:  Current Outpatient Medications:    amLODipine (NORVASC) 10 MG tablet, Take 10 mg by mouth every morning., Disp: , Rfl:    amoxicillin (AMOXIL) 500 MG capsule, Take 2,000 mg by mouth See admin instructions. 1 hour prior to dental procedures, Disp: , Rfl:    ARIPiprazole (ABILIFY) 2 MG tablet, Take 2 mg by mouth every morning., Disp: , Rfl:    Brimonidine Tartrate (LUMIFY) 0.025 % SOLN, Place 1 drop into both eyes 2 (two) times daily as needed (redness)., Disp: , Rfl:    celecoxib (CELEBREX) 200 MG capsule, Take 1 capsule (200 mg total) by mouth 2 (two) times daily., Disp: 60 capsule, Rfl: 1   Cholecalciferol (VITAMIN D-3 PO), Take 1 tablet by mouth daily at 6 (six) AM., Disp: , Rfl:    clonazePAM (KLONOPIN) 0.5 MG tablet, Take 0.5-1 mg by mouth 2 (two) times daily as needed for anxiety., Disp: , Rfl:    DULoxetine (CYMBALTA) 60 MG capsule, 60 mg every morning., Disp: , Rfl:    fluticasone (FLONASE) 50 MCG/ACT nasal spray, Place 2 sprays into both nostrils as needed for allergies or rhinitis., Disp: , Rfl:    furosemide (LASIX) 40 MG tablet, Take 40 mg by mouth daily as needed for fluid. , Disp: , Rfl:    ipratropium (ATROVENT) 0.03 % nasal spray, Place 2 sprays into both nostrils 2 (two) times daily., Disp: , Rfl:    losartan (COZAAR) 25 MG tablet, Take 25 mg by mouth daily., Disp: , Rfl:    Multiple Vitamin (MULTIVITAMIN WITH MINERALS) TABS tablet, Take 1 tablet by mouth daily., Disp: , Rfl:    NEEDLE, DISP, 23 G 23G X 1" MISC, Use 1 Units every 10 (ten) days., Disp: , Rfl:    oxyCODONE (OXY IR/ROXICODONE) 5 MG immediate release tablet, Take 1 tablet (5 mg  total) by mouth every 4 (four) hours as needed for moderate pain (pain score 4-6)., Disp: 30 tablet, Rfl: 0   sodium fluoride (FLUORISHIELD) 1.1 % GEL dental gel, Place  1 application onto teeth daily., Disp: , Rfl:    spironolactone (ALDACTONE) 25 MG tablet, Take 25 mg by mouth every morning., Disp: , Rfl:    testosterone cypionate (DEPOTESTOSTERONE CYPIONATE) 200 MG/ML injection, ADMINISTER 0.5 ML IN THE MUSCLE 1 TIME A WEEK (Patient taking differently: Inject 200 mg into the muscle once a week. ADMINISTER 0.5 ML IN THE MUSCLE 1 TIME A WEEK), Disp: 10 mL, Rfl: 0   traMADol (ULTRAM) 50 MG tablet, Take 1-2 tablets (50-100 mg total) by mouth every 4 (four) hours as needed for moderate pain., Disp: 30 tablet, Rfl: 0   traZODone (DESYREL) 100 MG tablet, Take 100 mg by mouth at bedtime., Disp: , Rfl:    vitamin B-12 (CYANOCOBALAMIN) 1000 MCG tablet, Take 2,000 mcg by mouth daily., Disp: , Rfl:   Social History: Social History   Tobacco Use   Smoking status: Never   Smokeless tobacco: Never  Vaping Use   Vaping Use: Never used  Substance Use Topics   Alcohol use: Yes    Alcohol/week: 1.0 standard drink of alcohol    Types: 1 Glasses of wine per week    Comment: 1-2 drinks every night   Drug use: No    Family Medical History: Family History  Problem Relation Age of Onset   Arthritis Mother    Asthma Mother    Cancer Mother    Heart disease Father     Physical Examination: Vitals:   12/16/22 0933  BP: 132/82    General: Patient is in no apparent distress. Attention to examination is appropriate.  Neck:   Supple.  Full range of motion.  Respiratory: Patient is breathing without any difficulty.   NEUROLOGICAL:     Awake, alert, oriented to person, place, and time.  Speech is clear and fluent.   Cranial Nerves: Pupils equal round and reactive to light.  Facial tone is symmetric.  Facial sensation is symmetric. Shoulder shrug is symmetric. Tongue protrusion is midline.     Strength: Side Biceps Triceps Deltoid Interossei Grip Wrist Ext. Wrist Flex.  R 5 5 5 5 5 5 5   L 5 5 5 5 5 5 5    Side Iliopsoas Quads Hamstring PF DF EHL  R 5 5 5 5 5 5   L 5 5 5 5 5 5    Reflexes are 1+ and symmetric at the biceps, triceps, brachioradialis, patella and achilles.   Hoffman's is absent.   Bilateral upper and lower extremity sensation is intact to light touch with except of below knee which is diminished bilaterally.    No evidence of dysmetria noted.  Gait is antalgic and wide-based.  He is unsteady.     Medical Decision Making  Imaging: MRI L spine 08/08/2022 IMPRESSION: 1. Diffuse lumbar spine spondylosis as described above. 2. At T12-L1 there is a broad-based disc bulge with a left foraminal/lateral disc protrusion. Mild bilateral facet arthropathy. Mild-moderate spinal stenosis. Moderate left foraminal stenosis. Mild right foraminal stenosis. 3. At L1-2 there is a broad-based disc bulge. Moderate bilateral facet arthropathy. Mild spinal stenosis. Bilateral lateral recess stenosis. Moderate bilateral foraminal stenosis. 4. At L2-3 there is a broad-based disc bulge. Prior laminectomy. Mild spinal stenosis. Bilateral lateral recess stenosis. Severe right and moderate left foraminal stenosis. 5. At L4-5 there is a broad-based disc bulge. Prior laminectomy. Moderate bilateral foraminal stenosis. 6. At L5-S1 there is a broad-based disc bulge. Prior laminectomy. Severe bilateral foraminal stenosis. 7. No acute osseous injury of fusion with multilevel foraminal spine.  Electronically Signed   By: Elige Ko M.D.   On: 08/09/2022 07:23  L spine flex ext 08/25/2022 IMPRESSION: 1. Interval L3-S1 posterior lumbar fusion with instrumentation. 2. Marked, progressive degenerative disc disease at L2-3 with development of 10 mm fixed retrolisthesis.     Electronically Signed   By: Helyn Numbers M.D.   On: 08/25/2022 01:45  CT L spine  12/05/2022 IMPRESSION: 1. Prior decompression and fusion from L3-L4 through L5-S1. Solid arthrodesis at L3-L4 and L4-L5. But loosening of both S1 pedicle screws and pseudoarthrosis at L5-S1.   2. 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   3. Advanced T12-L1 disc and endplate degeneration with spinal stenosis at the conus medullaris, see details on Thoracic MRI the same day reported separately.   4.  Aortic Atherosclerosis (ICD10-I70.0).     Electronically Signed   By: Odessa Fleming M.D.   On: 12/05/2022 09:05  MRI T spine 12/05/2022 IMPRESSION: 1. Widespread thoracic spine degeneration. Degenerative endplate marrow edema at T12-L1. No other acute osseous abnormality.   2. Multifactorial spinal stenosis at T11-T12 (mild) and T12-L1 (mild-to-moderate) and with up to mild mass effect on the conus medullaris at the latter. No conus or thoracic spinal cord signal abnormality.   3. No other thoracic spinal stenosis. Multilevel thoracic neural foraminal stenosis primarily related to facet hypertrophy; Severe at the left T8 and bilateral T9 nerve levels.   4. Chronic cervical ACDF. Partially visible adjacent segment disease at C3-C4 with suspected mild spinal stenosis. 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     Electronically Signed   By: Odessa Fleming M.D.   On: 12/05/2022 08:42   I have personally reviewed the images and agree with the above interpretation.  Assessment and Plan: Mr. Ostermiller is a pleasant 72 y.o. 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  Compared to his prior x-rays, he has had substantial degenerative changes in his L2-3 disc space.  He has multilevel foraminal stenosis that could be causing sciatica on both sides.  He suffers from back pain with  sciatica bilaterally.  Additionally, he has thoracic stenosis at T12-L1 with substantial degenerative disc disease at T12-L1.  His CT scan shows an obvious pseudoarthrosis at L5-S1.  I think he is having symptoms from his symptomatic pseudoarthrosis, retrolisthesis of L2-3, thoracic stenosis, and multilevel lumbar compression.  Unfortunately, there is no small intervention that could be considered to try to help with his symptoms.  I do not think that further conservative management is indicated, as he has severe changes on his CT and MRIs.  I have recommended L2-3 lateral lumbar interbody fusion to help correct his retrolisthesis followed by T10 to the pelvis posterior spinal fusion with removal and replacement of his implants at L3-S1, L5-S1 transforaminal lumbar interbody fusion to salvage the L5-S1 disc space, and T12-L2 decompression to address his thoracic and lumbar compression.  He has possible symptoms from upper and lower lumbar radicular compression.  We discussed that surgical intervention would be significant and would likely include L2-3 lateral lumbar interbody fusion to buttress the L2-3 disc space followed by posterior thoracolumbar intervention with multilevel foraminal decompression.  I discussed the planned procedure at length with the patient, including the risks, benefits, alternatives, and indications. The risks discussed include but are not limited to bleeding, infection, need for  reoperation, spinal fluid leak, stroke, vision loss, anesthetic complication, coma, paralysis, and even death. I also described in detail that improvement was not guaranteed.  The patient expressed understanding of these risks, and asked that we proceed with surgery. I described the surgery in layman's terms, and gave ample opportunity for questions, which were answered to the best of my ability.  We discussed the utilization of infuse or BMP.  He has history of basal cell carcinoma, but no systemic  cancers.  I did review with him that there was a slight increase and systemic cancer with use of BMP.  He expresses understanding and asked that we use it to try to address his pseudoarthrosis.   I spent a total of 30 minutes in this patient's care today. This time was spent reviewing pertinent records including imaging studies, obtaining and confirming history, performing a directed evaluation, formulating and discussing my recommendations, and documenting the visit within the medical record.    Thank you for involving me in the care of this patient.      Nilsa Macht K. Myer Haff MD, [facility] Neurosurgery

## 2022-12-16 NOTE — Patient Instructions (Signed)
Please see below for information in regards to your upcoming surgery:   Planned surgery: L2-3 lateral lumbar interbody fusion, T10-pelvis posterior spinal fusion, T12-L1 decompression, L1-2 posterior spinal decompression, L5-S1 transforaminal lumbar interbody fusion   Surgery date: 01/26/23 at [facility] - you will find out your arrival time the business day before your surgery.   Pre-op appointment at [facility] Pre-admit Testing: we will call you with a date/time for this. Pre-admit testing is located on the first floor of the [facility] building, [address] [facility] [address]. Please bring all prescriptions in the original prescription bottles to your appointment, even if you have reviewed medications by phone with a pharmacy representative. During this appointment, they will advise you which medications you can take the morning of surgery, and which medications you will need to hold for surgery. Pre-op labs may be done at your pre-op appointment. You are not required to fast for these labs. Should you need to change your pre-op appointment, please call Pre-admit testing at [phone].    Surgical clearance: we will send a clearance form to Laren Everts, NP     Brace: [facility] will contact you regarding an appointment for the brace you will use after surgery. Their number is [phone] should you miss their call or have an issue with your brace after surgery. You will need to bring the brace to the hospital on the day of surgery.    NSAIDS (Non-steroidal anti-inflammatory drugs): because you are having a fusion, please avoid taking any NSAIDS (examples: ibuprofen, motrin, aleve, naproxen, meloxicam, diclofenac) for 3 months after surgery. Celebrex is an exception and is OK to take, if prescribed. Tylenol is not an NSAID.     Because you are having a fusion or arthroplasty: for appointments after your 2 week follow-up: please arrive at the  [facility] outpatient imaging center ([address], [address], [employer]) or [facility] one hour prior to your appointment for x-rays. This applies to every appointment after your 2 week follow-up. Failure to do so may result in your appointment being rescheduled.    Common restrictions after surgery: No bending, lifting, or twisting ("BLT"). Avoid lifting objects heavier than 10 pounds for the first 6 weeks after surgery. Where possible, avoid household activities that involve lifting, bending, reaching, pushing, or pulling such as laundry, vacuuming, grocery shopping, and childcare. Try to arrange for help from friends and family for these activities while your back heals. Do not drive while taking prescription pain medication. Weeks 6 through 12 after surgery: avoid lifting more than 25 pounds.     How to contact us:  If you have any questions/concerns before or after surgery, you can reach [place] at [phone], or you can send a mychart message. We can be reached by phone or mychart 8am-4pm, Monday-Friday.  *Please note: Calls after 4pm are forwarded to a third party answering service. Mychart messages are not routinely monitored during evenings, weekends, and holidays. Please call our office to contact the answering service for urgent concerns during non-business hours.      Appointments/FMLA & disability paperwork: Patty [employer] Cristin  Nurse: Royston Cowper  Medical assistants: Laurann Montana Physician Assistant's: Manning Charity & [name] Surgeons: Venetia Night, MD & Ernestine Mcmurray, MD

## 2022-12-18 ENCOUNTER — Other Ambulatory Visit: Payer: Self-pay

## 2022-12-18 DIAGNOSIS — M4310 Spondylolisthesis, site unspecified: Secondary | ICD-10-CM

## 2022-12-18 DIAGNOSIS — G8929 Other chronic pain: Secondary | ICD-10-CM

## 2022-12-18 DIAGNOSIS — M4804 Spinal stenosis, thoracic region: Secondary | ICD-10-CM

## 2022-12-18 DIAGNOSIS — M4316 Spondylolisthesis, lumbar region: Secondary | ICD-10-CM

## 2022-12-18 DIAGNOSIS — M960 Pseudarthrosis after fusion or arthrodesis: Secondary | ICD-10-CM

## 2022-12-29 ENCOUNTER — Other Ambulatory Visit: Payer: Self-pay

## 2022-12-29 DIAGNOSIS — Z01818 Encounter for other preprocedural examination: Secondary | ICD-10-CM

## 2023-01-01 ENCOUNTER — Other Ambulatory Visit: Payer: Self-pay | Admitting: *Deleted

## 2023-01-01 ENCOUNTER — Encounter: Payer: Self-pay | Admitting: Urology

## 2023-01-01 DIAGNOSIS — E291 Testicular hypofunction: Secondary | ICD-10-CM

## 2023-01-04 MED ORDER — TESTOSTERONE CYPIONATE 200 MG/ML IM SOLN: INTRAMUSCULAR | 0 refills | Status: DC

## 2023-01-14 ENCOUNTER — Encounter
Admission: RE | Admit: 2023-01-14 | Discharge: 2023-01-14 | Disposition: A | Discharge status: Home / Self Care | Payer: HMO | Source: Ambulatory Visit | Attending: Neurosurgery | Admitting: Neurosurgery

## 2023-01-14 ENCOUNTER — Other Ambulatory Visit: Payer: Self-pay

## 2023-01-14 VITALS — BP 141/93 | HR 65 | Resp 18 | Ht 69.0 in | Wt 228.2 lb

## 2023-01-14 DIAGNOSIS — M4316 Spondylolisthesis, lumbar region: Secondary | ICD-10-CM | POA: Diagnosis not present

## 2023-01-14 DIAGNOSIS — Z01812 Encounter for preprocedural laboratory examination: Secondary | ICD-10-CM | POA: Diagnosis not present

## 2023-01-14 DIAGNOSIS — M960 Pseudarthrosis after fusion or arthrodesis: Secondary | ICD-10-CM | POA: Diagnosis not present

## 2023-01-14 DIAGNOSIS — M5136 Other intervertebral disc degeneration, lumbar region: Secondary | ICD-10-CM | POA: Diagnosis not present

## 2023-01-14 DIAGNOSIS — Z01818 Encounter for other preprocedural examination: Secondary | ICD-10-CM | POA: Diagnosis present

## 2023-01-14 HISTORY — DX: Secondary polycythemia: D75.1

## 2023-01-14 HISTORY — DX: Personal history of other infectious and parasitic diseases: Z86.19

## 2023-01-14 LAB — CBC
HCT: 52.4 % — ABNORMAL HIGH (ref 39.0–52.0)
Hemoglobin: 17.2 g/dL — ABNORMAL HIGH (ref 13.0–17.0)
MCH: 30 pg (ref 26.0–34.0)
MCHC: 32.8 g/dL (ref 30.0–36.0)
MCV: 91.3 fL (ref 80.0–100.0)
Platelets: 176 10*3/uL (ref 150–400)
RBC: 5.74 MIL/uL (ref 4.22–5.81)
RDW: 12.8 % (ref 11.5–15.5)
WBC: 4.5 10*3/uL (ref 4.0–10.5)
nRBC: 0 % (ref 0.0–0.2)

## 2023-01-14 LAB — TYPE AND SCREEN
ABO/RH(D): A POS
Antibody Screen: NEGATIVE

## 2023-01-14 LAB — BASIC METABOLIC PANEL
Anion gap: 9 (ref 5–15)
BUN: 15 mg/dL (ref 8–23)
CO2: 27 mmol/L (ref 22–32)
Calcium: 9.3 mg/dL (ref 8.9–10.3)
Chloride: 101 mmol/L (ref 98–111)
Creatinine, Ser: 0.96 mg/dL (ref 0.61–1.24)
GFR, Estimated: >60 mL/min (ref >60)
Glucose, Bld: 99 mg/dL (ref 70–99)
Potassium: 3.6 mmol/L (ref 3.5–5.1)
Sodium: 137 mmol/L (ref 135–145)

## 2023-01-14 LAB — URINALYSIS, ROUTINE W REFLEX MICROSCOPIC
Bilirubin Urine: NEGATIVE
Glucose, UA: NEGATIVE mg/dL
Hgb urine dipstick: NEGATIVE
Ketones, ur: NEGATIVE mg/dL
Leukocytes,Ua: NEGATIVE
Nitrite: NEGATIVE
Protein, ur: NEGATIVE mg/dL
Specific Gravity, Urine: 1.013 (ref 1.005–1.030)
pH: 6 (ref 5.0–8.0)

## 2023-01-14 LAB — SURGICAL PCR SCREEN
MRSA, PCR: NEGATIVE
Staphylococcus aureus: POSITIVE — AB

## 2023-01-14 NOTE — Patient Instructions (Addendum)
Your procedure is scheduled on: 01/26/23 - Monday Report to the Registration Desk on the 1st floor of the [facility]. To find out your arrival time, please call [phone] between 1PM - 3PM on: 01/23/23 - Friday If your arrival time is 6:00 am, do not arrive before that time as the [facility] entrance doors do not open until 6:00 am.  REMEMBER: Instructions that are not followed completely may result in serious medical risk, up to and including death; or upon the discretion of your surgeon and anesthesiologist your surgery may need to be rescheduled.  Do not eat food after midnight the night before surgery.  No gum chewing or hard candies.  You may however, drink CLEAR liquids up to 2 hours before you are scheduled to arrive for your surgery. Do not drink anything within 2 hours of your scheduled arrival time.  Clear liquids include: - water  - apple juice without pulp - gatorade (not RED colors) - black coffee or tea (Do NOT add milk or creamers to the coffee or tea) Do NOT drink anything that is not on this list.  Stop beginning 01/19/23, Anti-inflammatories (NSAIDS) such as Advil, Aleve, Ibuprofen, Motrin, Naproxen, Naprosyn and Aspirin based products such as Excedrin, Goody's Powder, BC Powder.  Stop ANY OVER THE COUNTER supplements until after surgery: VITAMIN D-3 , Multiple Vitamin .  You may take Tylenol if needed for pain up until the day of surgery.   TAKE ONLY THESE MEDICATIONS THE MORNING OF SURGERY WITH A SIP OF WATER:  -amLODipine (NORVASC)  -celecoxib (CELEBREX)  -DULoxetine (CYMBALTA)  -ARIPiprazole (ABILIFY)  -You may take clonazePAM (KLONOPIN) for anxiety if needed   No Alcohol for 24 hours before or after surgery.  No Smoking including e-cigarettes for 24 hours before surgery.  No chewable tobacco products for at least 6 hours before surgery.  No nicotine patches on the day of surgery.  Do not use any "recreational" drugs for at least a week  (preferably 2 weeks) before your surgery.  Please be advised that the combination of cocaine and anesthesia may have negative outcomes, up to and including death. If you test positive for cocaine, your surgery will be cancelled.  On the morning of surgery brush your teeth with toothpaste and water, you may rinse your mouth with mouthwash if you wish. Do not swallow any toothpaste or mouthwash.  Use CHG Soap or wipes as directed on instruction sheet.  Do not wear jewelry, make-up, hairpins, clips or nail polish.  Do not wear lotions, powders, or perfumes.   Contact lenses, hearing aids and dentures may not be worn into surgery.  Do not bring valuables to the hospital. [facility] is not responsible for any missing/lost belongings or valuables.   Notify your doctor if there is any change in your medical condition (cold, fever, infection).  Wear comfortable clothing (specific to your surgery type) to the hospital.  After surgery, you can help prevent lung complications by doing breathing exercises.  Take deep breaths and cough every 1-2 hours. Your doctor may order a device called an Incentive Spirometer to help you take deep breaths. When coughing or sneezing, hold a pillow firmly against your incision with both hands. This is called "splinting." Doing this helps protect your incision. It also decreases belly discomfort.  If you are being admitted to the hospital overnight, leave your suitcase in the car. After surgery it may be brought to your room.  In case of increased patient census, it may  be necessary for you, the patient, to continue your postoperative care in the Same Day Surgery department.  If you are being discharged the day of surgery, you will not be allowed to drive home. You will need a responsible individual to drive you home and stay with you for 24 hours after surgery.   If you are taking public transportation, you will need to have a responsible individual with  you.  Please call the Pre-admissions Testing Dept. at [phone] if you have any questions about these instructions.  Surgery Visitation Policy:  Patients having surgery or a procedure may have two visitors.  Children under the age of 70 must have an adult with them who is not the patient.  Inpatient Visitation:    Visiting hours are 7 a.m. to 8 p.m. Up to four visitors are allowed at one time in a patient room. The visitors may rotate out with other people during the day.  One visitor age 40 or older may stay with the patient overnight and must be in the room by 8 p.m.    Pre-operative 5 CHG Bath Instructions   You can play a key role in reducing the risk of infection after surgery. Your skin needs to be as free of germs as possible. You can reduce the number of germs on your skin by washing with CHG (chlorhexidine gluconate) soap before surgery. CHG is an antiseptic soap that kills germs and continues to kill germs even after washing.   DO NOT use if you have an allergy to chlorhexidine/CHG or antibacterial soaps. If your skin becomes reddened or irritated, stop using the CHG and notify one of our RNs at [phone].   Please shower with the CHG soap starting 4 days before surgery using the following schedule: 08/15 - 08/19.    Please keep in mind the following:  DO NOT shave, including legs and underarms, starting the day of your first shower.   You may shave your face at any point before/day of surgery.  Place clean sheets on your bed the day you start using CHG soap. Use a clean washcloth (not used since being washed) for each shower. DO NOT sleep with pets once you start using the CHG.   CHG Shower Instructions:  If you choose to wash your hair and private area, wash first with your normal shampoo/soap.  After you use shampoo/soap, rinse your hair and body thoroughly to remove shampoo/soap residue.  Turn the water OFF and apply about 3 tablespoons (45 ml) of CHG soap  to a CLEAN washcloth.  Apply CHG soap ONLY FROM YOUR NECK DOWN TO YOUR TOES (washing for 3-5 minutes)  DO NOT use CHG soap on face, private areas, open wounds, or sores.  Pay special attention to the area where your surgery is being performed.  If you are having back surgery, having someone wash your back for you may be helpful. Wait 2 minutes after CHG soap is applied, then you may rinse off the CHG soap.  Pat dry with a clean towel  Put on clean clothes/pajamas   If you choose to wear lotion, please use ONLY the CHG-compatible lotions on the back of this paper.     Additional instructions for the day of surgery: DO NOT APPLY any lotions, deodorants, cologne, or perfumes.   Put on clean/comfortable clothes.  Brush your teeth.  Ask your nurse before applying any prescription medications to the skin.      CHG Compatible Lotions   Aveeno  Moisturizing lotion  Cetaphil Moisturizing Cream  Cetaphil Moisturizing Lotion  Clairol Herbal Essence Moisturizing Lotion, Dry Skin  Clairol Herbal Essence Moisturizing Lotion, Extra Dry Skin  Clairol Herbal Essence Moisturizing Lotion, Normal Skin  Curel Age Defying Therapeutic Moisturizing Lotion with Alpha Hydroxy  Curel Extreme Care Body Lotion  Curel Soothing Hands Moisturizing Hand Lotion  Curel Therapeutic Moisturizing Cream, Fragrance-Free  Curel Therapeutic Moisturizing Lotion, Fragrance-Free  Curel Therapeutic Moisturizing Lotion, Original Formula  Eucerin Daily Replenishing Lotion  Eucerin Dry Skin Therapy Plus Alpha Hydroxy Crme  Eucerin Dry Skin Therapy Plus Alpha Hydroxy Lotion  Eucerin Original Crme  Eucerin Original Lotion  Eucerin Plus Crme Eucerin Plus Lotion  Eucerin TriLipid Replenishing Lotion  Keri Anti-Bacterial Hand Lotion  Keri Deep Conditioning Original Lotion Dry Skin Formula Softly Scented  Keri Deep Conditioning Original Lotion, Fragrance Free Sensitive Skin Formula  Keri Lotion Fast Absorbing Fragrance Free  Sensitive Skin Formula  Keri Lotion Fast Absorbing Softly Scented Dry Skin Formula  Keri Original Lotion  Keri Skin Renewal Lotion Keri Silky Smooth Lotion  Keri Silky Smooth Sensitive Skin Lotion  Nivea Body Creamy Conditioning Oil  Nivea Body Extra Enriched Teacher, adult education Moisturizing Lotion Nivea Crme  Nivea Skin Firming Lotion  NutraDerm 30 Skin Lotion  NutraDerm Skin Lotion  NutraDerm Therapeutic Skin Cream  NutraDerm Therapeutic Skin Lotion  ProShield Protective Hand Cream  Provon moisturizing lotion

## 2023-01-21 ENCOUNTER — Other Ambulatory Visit: Payer: HMO

## 2023-01-21 DIAGNOSIS — E291 Testicular hypofunction: Secondary | ICD-10-CM

## 2023-01-22 ENCOUNTER — Other Ambulatory Visit: Payer: HMO

## 2023-01-22 LAB — TESTOSTERONE: Testosterone: 500 ng/dL (ref 264–916)

## 2023-01-26 ENCOUNTER — Other Ambulatory Visit: Payer: HMO

## 2023-01-26 ENCOUNTER — Inpatient Hospital Stay: Payer: HMO

## 2023-01-26 ENCOUNTER — Inpatient Hospital Stay: Payer: HMO | Admitting: Urgent Care

## 2023-01-26 ENCOUNTER — Inpatient Hospital Stay: Payer: HMO | Admitting: Certified Registered Nurse Anesthetist

## 2023-01-26 ENCOUNTER — Inpatient Hospital Stay
Admission: RE | Admit: 2023-01-26 | Discharge: 2023-01-30 | DRG: 454 | Disposition: A | Discharge status: Home Health | Payer: HMO | Attending: Neurosurgery | Admitting: Neurosurgery

## 2023-01-26 ENCOUNTER — Encounter: Payer: Self-pay | Admitting: Neurosurgery

## 2023-01-26 ENCOUNTER — Encounter
Admission: RE | Disposition: A | Discharge status: Home Health | Payer: Self-pay | Source: Home / Self Care | Attending: Neurosurgery

## 2023-01-26 ENCOUNTER — Other Ambulatory Visit: Payer: Self-pay

## 2023-01-26 DIAGNOSIS — M4316 Spondylolisthesis, lumbar region: Secondary | ICD-10-CM | POA: Diagnosis not present

## 2023-01-26 DIAGNOSIS — I10 Essential (primary) hypertension: Secondary | ICD-10-CM | POA: Diagnosis present

## 2023-01-26 DIAGNOSIS — M5030 Other cervical disc degeneration, unspecified cervical region: Secondary | ICD-10-CM | POA: Diagnosis present

## 2023-01-26 DIAGNOSIS — K7460 Unspecified cirrhosis of liver: Secondary | ICD-10-CM | POA: Diagnosis not present

## 2023-01-26 DIAGNOSIS — M5442 Lumbago with sciatica, left side: Secondary | ICD-10-CM | POA: Diagnosis not present

## 2023-01-26 DIAGNOSIS — Z9049 Acquired absence of other specified parts of digestive tract: Secondary | ICD-10-CM

## 2023-01-26 DIAGNOSIS — M5136 Other intervertebral disc degeneration, lumbar region: Secondary | ICD-10-CM

## 2023-01-26 DIAGNOSIS — M4310 Spondylolisthesis, site unspecified: Secondary | ICD-10-CM | POA: Diagnosis present

## 2023-01-26 DIAGNOSIS — Z8249 Family history of ischemic heart disease and other diseases of the circulatory system: Secondary | ICD-10-CM

## 2023-01-26 DIAGNOSIS — M4805 Spinal stenosis, thoracolumbar region: Principal | ICD-10-CM | POA: Diagnosis present

## 2023-01-26 DIAGNOSIS — Z981 Arthrodesis status: Secondary | ICD-10-CM | POA: Diagnosis not present

## 2023-01-26 DIAGNOSIS — K5900 Constipation, unspecified: Secondary | ICD-10-CM | POA: Diagnosis not present

## 2023-01-26 DIAGNOSIS — N401 Enlarged prostate with lower urinary tract symptoms: Secondary | ICD-10-CM | POA: Diagnosis not present

## 2023-01-26 DIAGNOSIS — M4804 Spinal stenosis, thoracic region: Secondary | ICD-10-CM | POA: Diagnosis not present

## 2023-01-26 DIAGNOSIS — Z01818 Encounter for other preprocedural examination: Secondary | ICD-10-CM

## 2023-01-26 DIAGNOSIS — Z96653 Presence of artificial knee joint, bilateral: Secondary | ICD-10-CM | POA: Diagnosis present

## 2023-01-26 DIAGNOSIS — F419 Anxiety disorder, unspecified: Secondary | ICD-10-CM | POA: Diagnosis present

## 2023-01-26 DIAGNOSIS — G4733 Obstructive sleep apnea (adult) (pediatric): Secondary | ICD-10-CM | POA: Diagnosis not present

## 2023-01-26 DIAGNOSIS — Z87442 Personal history of urinary calculi: Secondary | ICD-10-CM | POA: Diagnosis not present

## 2023-01-26 DIAGNOSIS — Z9842 Cataract extraction status, left eye: Secondary | ICD-10-CM | POA: Diagnosis not present

## 2023-01-26 DIAGNOSIS — Z85828 Personal history of other malignant neoplasm of skin: Secondary | ICD-10-CM | POA: Diagnosis not present

## 2023-01-26 DIAGNOSIS — G8929 Other chronic pain: Secondary | ICD-10-CM | POA: Diagnosis present

## 2023-01-26 DIAGNOSIS — G629 Polyneuropathy, unspecified: Secondary | ICD-10-CM | POA: Diagnosis not present

## 2023-01-26 DIAGNOSIS — M960 Pseudarthrosis after fusion or arthrodesis: Secondary | ICD-10-CM | POA: Diagnosis not present

## 2023-01-26 DIAGNOSIS — M5441 Lumbago with sciatica, right side: Secondary | ICD-10-CM | POA: Diagnosis present

## 2023-01-26 DIAGNOSIS — M51369 Other intervertebral disc degeneration, lumbar region without mention of lumbar back pain or lower extremity pain: Secondary | ICD-10-CM

## 2023-01-26 DIAGNOSIS — M5135 Other intervertebral disc degeneration, thoracolumbar region: Secondary | ICD-10-CM | POA: Diagnosis present

## 2023-01-26 DIAGNOSIS — Z8719 Personal history of other diseases of the digestive system: Secondary | ICD-10-CM

## 2023-01-26 DIAGNOSIS — Z888 Allergy status to other drugs, medicaments and biological substances status: Secondary | ICD-10-CM | POA: Diagnosis not present

## 2023-01-26 HISTORY — PX: TRANSFORAMINAL LUMBAR INTERBODY FUSION W/ MIS 1 LEVEL: SHX6145

## 2023-01-26 HISTORY — PX: ANTERIOR LAT LUMBAR FUSION: SHX1168

## 2023-01-26 HISTORY — PX: APPLICATION OF INTRAOPERATIVE CT SCAN: SHX6668

## 2023-01-26 HISTORY — PX: LUMBAR LAMINECTOMY/DECOMPRESSION MICRODISCECTOMY: SHX5026

## 2023-01-26 HISTORY — PX: POSTERIOR LUMBAR FUSION 4 WITH HARDWARE REMOVAL: SHX6038

## 2023-01-26 LAB — GLUCOSE, CAPILLARY: Glucose-Capillary: 149 mg/dL — ABNORMAL HIGH (ref 70–99)

## 2023-01-26 SURGERY — ANTERIOR LATERAL LUMBAR FUSION 1 LEVEL
Anesthesia: General

## 2023-01-26 MED ORDER — SODIUM CHLORIDE 0.9 % IV SOLN
INTRAVENOUS | Status: DC | PRN
  Administered 2023-01-26: .1 ug/kg/min via INTRAVENOUS

## 2023-01-26 MED ORDER — GLYCOPYRROLATE 0.2 MG/ML IJ SOLN
INTRAMUSCULAR | Status: DC | PRN
  Administered 2023-01-26: .2 mg via INTRAVENOUS

## 2023-01-26 MED ORDER — PROMETHAZINE HCL 25 MG/ML IJ SOLN: 6.2500 mg | INTRAMUSCULAR | Status: DC | PRN

## 2023-01-26 MED ORDER — DROPERIDOL 2.5 MG/ML IJ SOLN: 0.6250 mg | Freq: Once | INTRAMUSCULAR | Status: DC | PRN

## 2023-01-26 MED ORDER — FENTANYL CITRATE (PF) 100 MCG/2ML IJ SOLN: 25.0000 ug | INTRAMUSCULAR | Status: DC | PRN

## 2023-01-26 MED ORDER — BRIMONIDINE TARTRATE 0.025 % OP SOLN: 1.0000 [drp] | Freq: Two times a day (BID) | OPHTHALMIC | Status: DC | PRN

## 2023-01-26 MED ORDER — KETAMINE HCL 10 MG/ML IJ SOLN
INTRAMUSCULAR | Status: DC | PRN
Start: 2023-01-26 — End: 2023-01-26
  Administered 2023-01-26: 40 mg via INTRAVENOUS
  Administered 2023-01-26: 10 mg via INTRAVENOUS

## 2023-01-26 MED ORDER — CEFAZOLIN SODIUM-DEXTROSE 2-4 GM/100ML-% IV SOLN
2.0000 g | INTRAVENOUS | Status: AC
  Administered 2023-01-26 (×2): 2 g via INTRAVENOUS

## 2023-01-26 MED ORDER — ACETAMINOPHEN 10 MG/ML IV SOLN: 1000.0000 mg | Freq: Once | INTRAVENOUS | Status: DC | PRN

## 2023-01-26 MED ORDER — KETOROLAC TROMETHAMINE 15 MG/ML IJ SOLN
7.5000 mg | Freq: Four times a day (QID) | INTRAMUSCULAR | Status: AC
  Administered 2023-01-26 – 2023-01-27 (×4): 7.5 mg via INTRAVENOUS
  Filled 2023-01-26 (×4): qty 1

## 2023-01-26 MED ORDER — CLONAZEPAM 0.5 MG PO TABS
0.5000 mg | ORAL_TABLET | Freq: Two times a day (BID) | ORAL | Status: DC | PRN
  Administered 2023-01-27: 0.5 mg via ORAL
  Filled 2023-01-26: qty 1

## 2023-01-26 MED ORDER — OXYCODONE HCL 5 MG PO TABS
5.0000 mg | ORAL_TABLET | ORAL | Status: DC | PRN
  Filled 2023-01-26: qty 1

## 2023-01-26 MED ORDER — SPIRONOLACTONE 25 MG PO TABS
25.0000 mg | ORAL_TABLET | ORAL | Status: DC
  Administered 2023-01-27 – 2023-01-30 (×4): 25 mg via ORAL
  Filled 2023-01-26 (×5): qty 1

## 2023-01-26 MED ORDER — EPHEDRINE SULFATE (PRESSORS) 50 MG/ML IJ SOLN
INTRAMUSCULAR | Status: DC | PRN
  Administered 2023-01-26: 10 mg via INTRAVENOUS
  Administered 2023-01-26: 5 mg via INTRAVENOUS
  Administered 2023-01-26: 10 mg via INTRAVENOUS
  Administered 2023-01-26: 15 mg via INTRAVENOUS
  Administered 2023-01-26 (×3): 10 mg via INTRAVENOUS
  Administered 2023-01-26: 5 mg via INTRAVENOUS
  Administered 2023-01-26: 10 mg via INTRAVENOUS

## 2023-01-26 MED ORDER — ACETAMINOPHEN 10 MG/ML IV SOLN
INTRAVENOUS | Status: DC | PRN
  Administered 2023-01-26: 1000 mg via INTRAVENOUS

## 2023-01-26 MED ORDER — REMIFENTANIL HCL 1 MG IV SOLR
INTRAVENOUS | Status: AC
  Filled 2023-01-26: qty 1000

## 2023-01-26 MED ORDER — PROPOFOL 500 MG/50ML IV EMUL
INTRAVENOUS | Status: DC | PRN
  Administered 2023-01-26: 125 ug/kg/min via INTRAVENOUS

## 2023-01-26 MED ORDER — SODIUM FLUORIDE 1.1 % DT GEL: 1.0000 | Freq: Every day | DENTAL | Status: DC

## 2023-01-26 MED ORDER — DULOXETINE HCL 30 MG PO CPEP
60.0000 mg | ORAL_CAPSULE | ORAL | Status: DC
  Administered 2023-01-27 – 2023-01-30 (×4): 60 mg via ORAL
  Filled 2023-01-26 (×5): qty 2

## 2023-01-26 MED ORDER — VANCOMYCIN HCL 1000 MG IV SOLR
INTRAVENOUS | Status: AC
  Filled 2023-01-26: qty 20

## 2023-01-26 MED ORDER — PROPOFOL 1000 MG/100ML IV EMUL
INTRAVENOUS | Status: AC
  Filled 2023-01-26: qty 100

## 2023-01-26 MED ORDER — SODIUM CHLORIDE 0.9 % IV SOLN
250.0000 mL | INTRAVENOUS | Status: DC
  Administered 2023-01-26: 250 mL via INTRAVENOUS

## 2023-01-26 MED ORDER — MIDAZOLAM HCL 2 MG/2ML IJ SOLN
INTRAMUSCULAR | Status: AC
  Filled 2023-01-26: qty 2

## 2023-01-26 MED ORDER — PHENYLEPHRINE HCL-NACL 20-0.9 MG/250ML-% IV SOLN
INTRAVENOUS | Status: DC | PRN
  Administered 2023-01-26: 50 ug/min via INTRAVENOUS

## 2023-01-26 MED ORDER — DOCUSATE SODIUM 100 MG PO CAPS
100.0000 mg | ORAL_CAPSULE | Freq: Two times a day (BID) | ORAL | Status: DC
  Administered 2023-01-26 – 2023-01-30 (×8): 100 mg via ORAL
  Filled 2023-01-26 (×8): qty 1

## 2023-01-26 MED ORDER — ONDANSETRON HCL 4 MG/2ML IJ SOLN
INTRAMUSCULAR | Status: DC | PRN
  Administered 2023-01-26: 4 mg via INTRAVENOUS

## 2023-01-26 MED ORDER — METHOCARBAMOL 1000 MG/10ML IJ SOLN
500.0000 mg | Freq: Four times a day (QID) | INTRAVENOUS | Status: DC
  Administered 2023-01-26: 500 mg via INTRAVENOUS
  Filled 2023-01-26: qty 5

## 2023-01-26 MED ORDER — SODIUM CHLORIDE FLUSH 0.9 % IV SOLN
INTRAVENOUS | Status: AC
  Filled 2023-01-26: qty 20

## 2023-01-26 MED ORDER — MIDAZOLAM HCL 2 MG/2ML IJ SOLN
INTRAMUSCULAR | Status: DC | PRN
  Administered 2023-01-26: 2 mg via INTRAVENOUS

## 2023-01-26 MED ORDER — CHLORHEXIDINE GLUCONATE 0.12 % MT SOLN
OROMUCOSAL | Status: AC
  Filled 2023-01-26: qty 15

## 2023-01-26 MED ORDER — DEXAMETHASONE SODIUM PHOSPHATE 10 MG/ML IJ SOLN
INTRAMUSCULAR | Status: DC | PRN
  Administered 2023-01-26: 10 mg via INTRAVENOUS

## 2023-01-26 MED ORDER — CEFAZOLIN SODIUM-DEXTROSE 2-4 GM/100ML-% IV SOLN
INTRAVENOUS | Status: AC
  Filled 2023-01-26: qty 100

## 2023-01-26 MED ORDER — PHENOL 1.4 % MT LIQD: 1.0000 | OROMUCOSAL | Status: DC | PRN

## 2023-01-26 MED ORDER — SUCCINYLCHOLINE CHLORIDE 200 MG/10ML IV SOSY
PREFILLED_SYRINGE | INTRAVENOUS | Status: DC | PRN
  Administered 2023-01-26: 140 mg via INTRAVENOUS

## 2023-01-26 MED ORDER — ORAL CARE MOUTH RINSE: 15.0000 mL | Freq: Once | OROMUCOSAL | Status: AC

## 2023-01-26 MED ORDER — METHOCARBAMOL 500 MG PO TABS
500.0000 mg | ORAL_TABLET | Freq: Four times a day (QID) | ORAL | Status: DC
  Administered 2023-01-27 – 2023-01-30 (×14): 500 mg via ORAL
  Filled 2023-01-26 (×18): qty 1

## 2023-01-26 MED ORDER — ENOXAPARIN SODIUM 40 MG/0.4ML IJ SOSY
40.0000 mg | PREFILLED_SYRINGE | INTRAMUSCULAR | Status: DC
  Administered 2023-01-27 – 2023-01-30 (×4): 40 mg via SUBCUTANEOUS
  Filled 2023-01-26 (×4): qty 0.4

## 2023-01-26 MED ORDER — TRAZODONE HCL 100 MG PO TABS
100.0000 mg | ORAL_TABLET | Freq: Every day | ORAL | Status: DC
  Administered 2023-01-26 – 2023-01-29 (×4): 100 mg via ORAL
  Filled 2023-01-26 (×4): qty 1

## 2023-01-26 MED ORDER — CEFAZOLIN SODIUM 1 G IJ SOLR
INTRAMUSCULAR | Status: AC
  Filled 2023-01-26: qty 20

## 2023-01-26 MED ORDER — MORPHINE SULFATE (PF) 2 MG/ML IV SOLN
2.0000 mg | INTRAVENOUS | Status: AC | PRN
  Administered 2023-01-26 – 2023-01-27 (×4): 2 mg via INTRAVENOUS
  Filled 2023-01-26 (×4): qty 1

## 2023-01-26 MED ORDER — OXYCODONE HCL 5 MG PO TABS
10.0000 mg | ORAL_TABLET | ORAL | Status: DC | PRN
  Administered 2023-01-27 – 2023-01-30 (×17): 10 mg via ORAL
  Filled 2023-01-26 (×17): qty 2

## 2023-01-26 MED ORDER — LIDOCAINE HCL (PF) 2 % IJ SOLN
INTRAMUSCULAR | Status: AC
  Filled 2023-01-26: qty 5

## 2023-01-26 MED ORDER — FUROSEMIDE 40 MG PO TABS: 40.0000 mg | ORAL_TABLET | Freq: Every day | ORAL | Status: DC | PRN

## 2023-01-26 MED ORDER — SODIUM CHLORIDE 0.9% FLUSH
3.0000 mL | Freq: Two times a day (BID) | INTRAVENOUS | Status: DC
  Administered 2023-01-26 – 2023-01-30 (×8): 3 mL via INTRAVENOUS

## 2023-01-26 MED ORDER — ONDANSETRON HCL 4 MG/2ML IJ SOLN: 4.0000 mg | Freq: Four times a day (QID) | INTRAMUSCULAR | Status: DC | PRN

## 2023-01-26 MED ORDER — SUCCINYLCHOLINE CHLORIDE 200 MG/10ML IV SOSY
PREFILLED_SYRINGE | INTRAVENOUS | Status: AC
  Filled 2023-01-26: qty 10

## 2023-01-26 MED ORDER — CHLORHEXIDINE GLUCONATE CLOTH 2 % EX PADS
6.0000 | MEDICATED_PAD | Freq: Every day | CUTANEOUS | Status: DC
  Administered 2023-01-26 – 2023-01-30 (×4): 6 via TOPICAL

## 2023-01-26 MED ORDER — PHENYLEPHRINE HCL-NACL 20-0.9 MG/250ML-% IV SOLN
INTRAVENOUS | Status: AC
  Filled 2023-01-26: qty 250

## 2023-01-26 MED ORDER — SENNA 8.6 MG PO TABS
1.0000 | ORAL_TABLET | Freq: Two times a day (BID) | ORAL | Status: DC
  Administered 2023-01-27 – 2023-01-30 (×7): 8.6 mg via ORAL
  Filled 2023-01-26 (×7): qty 1

## 2023-01-26 MED ORDER — ACETAMINOPHEN 325 MG PO TABS
650.0000 mg | ORAL_TABLET | ORAL | Status: DC | PRN
  Administered 2023-01-29: 650 mg via ORAL
  Filled 2023-01-26: qty 2

## 2023-01-26 MED ORDER — BISACODYL 10 MG RE SUPP: 10.0000 mg | Freq: Every day | RECTAL | Status: DC | PRN

## 2023-01-26 MED ORDER — LIDOCAINE HCL (CARDIAC) PF 100 MG/5ML IV SOSY
PREFILLED_SYRINGE | INTRAVENOUS | Status: DC | PRN
  Administered 2023-01-26: 100 mg via INTRAVENOUS
  Administered 2023-01-26: 40 mg via INTRAVENOUS

## 2023-01-26 MED ORDER — HEPARIN 30,000 UNITS/1000 ML (OHS) CELLSAVER SOLUTION
Status: AC | PRN
  Administered 2023-01-26: 1

## 2023-01-26 MED ORDER — VANCOMYCIN HCL IN DEXTROSE 1-5 GM/200ML-% IV SOLN
INTRAVENOUS | Status: AC
  Filled 2023-01-26: qty 200

## 2023-01-26 MED ORDER — POLYETHYLENE GLYCOL 3350 17 G PO PACK
17.0000 g | PACK | Freq: Every day | ORAL | Status: DC | PRN
  Administered 2023-01-29 – 2023-01-30 (×2): 17 g via ORAL
  Filled 2023-01-26 (×2): qty 1

## 2023-01-26 MED ORDER — ACETAMINOPHEN 10 MG/ML IV SOLN
INTRAVENOUS | Status: AC
  Filled 2023-01-26: qty 100

## 2023-01-26 MED ORDER — FAMOTIDINE 20 MG PO TABS
ORAL_TABLET | ORAL | Status: AC
  Filled 2023-01-26: qty 1

## 2023-01-26 MED ORDER — BUPIVACAINE HCL (PF) 0.5 % IJ SOLN
INTRAMUSCULAR | Status: AC
  Filled 2023-01-26: qty 60

## 2023-01-26 MED ORDER — FLUTICASONE PROPIONATE 50 MCG/ACT NA SUSP: 2.0000 | Freq: Every day | NASAL | Status: DC | PRN

## 2023-01-26 MED ORDER — ACETAMINOPHEN 650 MG RE SUPP: 650.0000 mg | RECTAL | Status: DC | PRN

## 2023-01-26 MED ORDER — SODIUM CHLORIDE (PF) 0.9 % IJ SOLN
INTRAMUSCULAR | Status: DC | PRN
  Administered 2023-01-26 (×2): 60 mL

## 2023-01-26 MED ORDER — SODIUM CHLORIDE 0.9 % IV SOLN: INTRAVENOUS | Status: DC | PRN

## 2023-01-26 MED ORDER — CHLORHEXIDINE GLUCONATE 0.12 % MT SOLN
15.0000 mL | Freq: Once | OROMUCOSAL | Status: AC
  Administered 2023-01-26: 15 mL via OROMUCOSAL

## 2023-01-26 MED ORDER — OXYCODONE HCL 5 MG/5ML PO SOLN: 5.0000 mg | Freq: Once | ORAL | Status: DC | PRN

## 2023-01-26 MED ORDER — AMLODIPINE BESYLATE 10 MG PO TABS
10.0000 mg | ORAL_TABLET | ORAL | Status: DC
  Administered 2023-01-27 – 2023-01-30 (×4): 10 mg via ORAL
  Filled 2023-01-26 (×5): qty 1

## 2023-01-26 MED ORDER — MAGNESIUM CITRATE PO SOLN: 1.0000 | Freq: Once | ORAL | Status: DC | PRN

## 2023-01-26 MED ORDER — KETAMINE HCL 50 MG/5ML IJ SOSY
PREFILLED_SYRINGE | INTRAMUSCULAR | Status: AC
  Filled 2023-01-26: qty 5

## 2023-01-26 MED ORDER — LACTATED RINGERS IV SOLN: INTRAVENOUS | Status: DC | PRN

## 2023-01-26 MED ORDER — IPRATROPIUM BROMIDE 0.03 % NA SOLN
2.0000 | Freq: Two times a day (BID) | NASAL | Status: DC
  Administered 2023-01-26 – 2023-01-29 (×7): 2 via NASAL
  Filled 2023-01-26 (×2): qty 30

## 2023-01-26 MED ORDER — BUPIVACAINE-EPINEPHRINE (PF) 0.5% -1:200000 IJ SOLN
INTRAMUSCULAR | Status: AC
  Filled 2023-01-26: qty 30

## 2023-01-26 MED ORDER — BUPIVACAINE LIPOSOME 1.3 % IJ SUSP
INTRAMUSCULAR | Status: AC
  Filled 2023-01-26: qty 20

## 2023-01-26 MED ORDER — OXYCODONE HCL 5 MG PO TABS: 5.0000 mg | ORAL_TABLET | Freq: Once | ORAL | Status: DC | PRN

## 2023-01-26 MED ORDER — DEXAMETHASONE SODIUM PHOSPHATE 10 MG/ML IJ SOLN
INTRAMUSCULAR | Status: AC
  Filled 2023-01-26: qty 1

## 2023-01-26 MED ORDER — CEFAZOLIN IN SODIUM CHLORIDE 2-0.9 GM/100ML-% IV SOLN
2.0000 g | Freq: Once | INTRAVENOUS | Status: DC
  Filled 2023-01-26: qty 100

## 2023-01-26 MED ORDER — GLYCOPYRROLATE 0.2 MG/ML IJ SOLN
INTRAMUSCULAR | Status: AC
  Filled 2023-01-26: qty 1

## 2023-01-26 MED ORDER — ONDANSETRON HCL 4 MG PO TABS: 4.0000 mg | ORAL_TABLET | Freq: Four times a day (QID) | ORAL | Status: DC | PRN

## 2023-01-26 MED ORDER — HEPARIN SODIUM (PORCINE) 10000 UNIT/ML IJ SOLN
INTRAMUSCULAR | Status: AC
  Filled 2023-01-26: qty 1

## 2023-01-26 MED ORDER — FAMOTIDINE 20 MG PO TABS
20.0000 mg | ORAL_TABLET | Freq: Once | ORAL | Status: AC
  Administered 2023-01-26: 20 mg via ORAL

## 2023-01-26 MED ORDER — SURGIFLO WITH THROMBIN (HEMOSTATIC MATRIX KIT) OPTIME
TOPICAL | Status: DC | PRN
  Administered 2023-01-26: 4 via TOPICAL

## 2023-01-26 MED ORDER — PHENYLEPHRINE 80 MCG/ML (10ML) SYRINGE FOR IV PUSH (FOR BLOOD PRESSURE SUPPORT)
PREFILLED_SYRINGE | INTRAVENOUS | Status: AC
  Filled 2023-01-26: qty 10

## 2023-01-26 MED ORDER — FENTANYL CITRATE (PF) 100 MCG/2ML IJ SOLN
INTRAMUSCULAR | Status: AC
  Filled 2023-01-26: qty 2

## 2023-01-26 MED ORDER — FENTANYL CITRATE (PF) 100 MCG/2ML IJ SOLN
INTRAMUSCULAR | Status: DC | PRN
  Administered 2023-01-26 (×2): 50 ug via INTRAVENOUS

## 2023-01-26 MED ORDER — SURGIRINSE WOUND IRRIGATION SYSTEM - OPTIME: TOPICAL | Status: DC | PRN

## 2023-01-26 MED ORDER — SODIUM CHLORIDE 0.9% FLUSH: 3.0000 mL | INTRAVENOUS | Status: DC | PRN

## 2023-01-26 MED ORDER — PROPOFOL 10 MG/ML IV BOLUS
INTRAVENOUS | Status: DC | PRN
Start: 2023-01-26 — End: 2023-01-26
  Administered 2023-01-26: 20 mg via INTRAVENOUS
  Administered 2023-01-26: 180 mg via INTRAVENOUS

## 2023-01-26 MED ORDER — LOSARTAN POTASSIUM 25 MG PO TABS
25.0000 mg | ORAL_TABLET | Freq: Every day | ORAL | Status: DC
  Administered 2023-01-27 – 2023-01-30 (×4): 25 mg via ORAL
  Filled 2023-01-26 (×4): qty 1

## 2023-01-26 MED ORDER — LACTATED RINGERS IV SOLN: INTRAVENOUS | Status: DC

## 2023-01-26 MED ORDER — PROPOFOL 10 MG/ML IV BOLUS
INTRAVENOUS | Status: AC
  Filled 2023-01-26: qty 20

## 2023-01-26 MED ORDER — VANCOMYCIN HCL 1000 MG IV SOLR
INTRAVENOUS | Status: DC | PRN
  Administered 2023-01-26: 2 g via TOPICAL

## 2023-01-26 MED ORDER — VITAMIN B-12 1000 MCG PO TABS
2000.0000 ug | ORAL_TABLET | Freq: Every day | ORAL | Status: DC
  Administered 2023-01-27 – 2023-01-30 (×4): 2000 ug via ORAL
  Filled 2023-01-26 (×4): qty 2

## 2023-01-26 MED ORDER — 0.9 % SODIUM CHLORIDE (POUR BTL) OPTIME
TOPICAL | Status: DC | PRN
  Administered 2023-01-26: 1800 mL

## 2023-01-26 MED ORDER — BUPIVACAINE-EPINEPHRINE (PF) 0.5% -1:200000 IJ SOLN
INTRAMUSCULAR | Status: DC | PRN
  Administered 2023-01-26: 23 mL via PERINEURAL

## 2023-01-26 MED ORDER — ARIPIPRAZOLE 2 MG PO TABS
2.0000 mg | ORAL_TABLET | ORAL | Status: DC
  Administered 2023-01-27 – 2023-01-30 (×4): 2 mg via ORAL
  Filled 2023-01-26 (×5): qty 1

## 2023-01-26 MED ORDER — SODIUM CHLORIDE (PF) 0.9 % IJ SOLN
INTRAMUSCULAR | Status: AC
  Filled 2023-01-26: qty 20

## 2023-01-26 MED ORDER — SODIUM CHLORIDE 0.9 % IV SOLN
INTRAVENOUS | Status: DC
  Administered 2023-01-27: 75 mL/h via INTRAVENOUS

## 2023-01-26 MED ORDER — ACETAMINOPHEN 500 MG PO TABS
1000.0000 mg | ORAL_TABLET | Freq: Four times a day (QID) | ORAL | Status: DC
  Administered 2023-01-26 – 2023-01-30 (×12): 1000 mg via ORAL
  Filled 2023-01-26 (×13): qty 2

## 2023-01-26 MED ORDER — VANCOMYCIN HCL IN DEXTROSE 1-5 GM/200ML-% IV SOLN
1000.0000 mg | Freq: Once | INTRAVENOUS | Status: AC
  Administered 2023-01-26: 1000 mg via INTRAVENOUS

## 2023-01-26 MED ORDER — MENTHOL 3 MG MT LOZG: 1.0000 | LOZENGE | OROMUCOSAL | Status: DC | PRN

## 2023-01-26 SURGICAL SUPPLY — 93 items
ADH SKN CLS APL DERMABOND .7 (GAUZE/BANDAGES/DRESSINGS) ×1
AGENT HMST KT MTR STRL THRMB (HEMOSTASIS) ×4
ALLOGRAFT BONE FIBER KORE 10CC (Bone Implant) IMPLANT
ALLOGRAFT BONESTRIP KORE 2.5X5 (Bone Implant) IMPLANT
ALLOGRAFT BONESTRP KORE 2.5X10 (Bone Implant) IMPLANT
APL PRP STRL LF DISP 70% ISPRP (MISCELLANEOUS) ×1
BASIN KIT SINGLE STR (MISCELLANEOUS) ×1 IMPLANT
BUR NEURO DRILL SOFT 3.0X3.8M (BURR) ×1 IMPLANT
CHLORAPREP W/TINT 26 (MISCELLANEOUS) ×1 IMPLANT
CNTNR URN SCR LID CUP LEK RST (MISCELLANEOUS) ×1 IMPLANT
CONNECTOR RELINE ROTATE 5-6MM (Connector) IMPLANT
CONT SPEC 4OZ STRL OR WHT (MISCELLANEOUS) ×1
CORD LIGHT LATERIAL X LIFT (MISCELLANEOUS) IMPLANT
COVERAGE SUPPORT SPINE BRAINLB (MISCELLANEOUS) ×1
DERMABOND ADVANCED .7 DNX12 (GAUZE/BANDAGES/DRESSINGS) ×1 IMPLANT
DRAPE C ARM PK CFD 31 SPINE (DRAPES) ×1 IMPLANT
DRAPE C-ARMOR (DRAPES) IMPLANT
DRAPE LAPAROTOMY 100X77 ABD (DRAPES) ×1 IMPLANT
DRAPE MICROSCOPE SPINE 48X150 (DRAPES) IMPLANT
DRAPE POUCH INSTRU U-SHP 10X18 (DRAPES) IMPLANT
DRAPE SCAN PATIENT (DRAPES) ×1 IMPLANT
DRSG OPSITE POSTOP 4X8 (GAUZE/BANDAGES/DRESSINGS) IMPLANT
DRSG TEGADERM 4X4.75 (GAUZE/BANDAGES/DRESSINGS) IMPLANT
ELECT EZSTD 165MM 6.5IN (MISCELLANEOUS) ×1
ELECT REM PT RETURN 9FT ADLT (ELECTROSURGICAL) ×2
ELECTRODE EZSTD 165MM 6.5IN (MISCELLANEOUS) ×1 IMPLANT
ELECTRODE REM PT RTRN 9FT ADLT (ELECTROSURGICAL) ×1 IMPLANT
EX-PIN ORTHOLOCK NAV 4X150 (PIN) IMPLANT
FEE CVG SUPP BRAINLAB NG SPNE (MISCELLANEOUS) IMPLANT
FEE INTRAOP CADWELL SUPPLY NCS (MISCELLANEOUS) IMPLANT
FEE INTRAOP MONITOR IMPULS NCS (MISCELLANEOUS) IMPLANT
FORCEPS BPLR BAYO 10IN 1.0TIP (ORTHOPEDIC DISPOSABLE SUPPLIES) IMPLANT
GAUZE 4X4 16PLY ~~LOC~~+RFID DBL (SPONGE) ×1 IMPLANT
GAUZE SPONGE 2X2 STRL 8-PLY (GAUZE/BANDAGES/DRESSINGS) ×1 IMPLANT
GLOVE BIOGEL PI IND STRL 6.5 (GLOVE) ×2 IMPLANT
GLOVE SURG SYN 6.5 ES PF (GLOVE) ×2
GLOVE SURG SYN 6.5 PF PI (GLOVE) ×2 IMPLANT
GLOVE SURG SYN 8.5 E (GLOVE) ×4
GLOVE SURG SYN 8.5 PF PI (GLOVE) ×4 IMPLANT
GOWN SRG LRG LVL 4 IMPRV REINF (GOWNS) ×3 IMPLANT
GOWN SRG XL LVL 3 NONREINFORCE (GOWNS) ×1 IMPLANT
GOWN STRL NON-REIN TWL XL LVL3 (GOWNS) ×1
GOWN STRL REIN LRG LVL4 (GOWNS) ×3
GRAFT DURAGEN MATRIX 1WX1L (Tissue) IMPLANT
HEMOVAC 400CC 10FR (MISCELLANEOUS) IMPLANT
HOLDER FOLEY CATH W/STRAP (MISCELLANEOUS) IMPLANT
INTERBODY SABLE 10X26 7-14 15D (Miscellaneous) IMPLANT
INTRAOP CADWELL SUPPLY FEE NCS (MISCELLANEOUS)
INTRAOP DISP SUPPLY FEE NCS (MISCELLANEOUS)
INTRAOP MONITOR FEE IMPULS NCS (MISCELLANEOUS)
IV NS 1000ML (IV SOLUTION) ×1
IV NS 1000ML BAXH (IV SOLUTION) IMPLANT
IV NS IRRIG 3000ML ARTHROMATIC (IV SOLUTION) IMPLANT
KIT DILATOR XLIF 5 (KITS) IMPLANT
KIT DISP MARS 3V (KITS) IMPLANT
KIT INFUSE LRG II (Orthopedic Implant) IMPLANT
KIT PREVENA INCISION MGT 13 (CANNISTER) IMPLANT
KIT SPINAL PRONEVIEW (KITS) ×1 IMPLANT
KNIFE BAYONET SHORT DISCETOMY (MISCELLANEOUS) IMPLANT
MANIFOLD NEPTUNE II (INSTRUMENTS) ×1 IMPLANT
MARKER SKIN DUAL TIP RULER LAB (MISCELLANEOUS) ×1 IMPLANT
MARKER SPHERE PSV REFLC 13MM (MARKER) ×7 IMPLANT
MODULE NVM5 NEXT GEN EMG (NEUROSURGERY SUPPLIES) IMPLANT
NDL SAFETY ECLIP 18X1.5 (MISCELLANEOUS) ×1 IMPLANT
NS IRRIG 1000ML POUR BTL (IV SOLUTION) ×1 IMPLANT
PACK LAMINECTOMY ARMC (PACKS) ×1 IMPLANT
ROD RELINE LOROTIC TI 5.5X55MM (Rod) IMPLANT
ROD RELINE-O 5.5X75 LORD (Rod) IMPLANT
ROD REVERE 5.5X450 PRECONTOUR (Rod) IMPLANT
SCREW LOCK RELINE 5.5 TULIP (Screw) IMPLANT
SCREW POLYAXIAL RELINE 8.5X40 (Screw) IMPLANT
SCREW RELINE-O 8.5X45MM POLY (Screw) IMPLANT
SCREW RELINE-O POLY 6.5X45 (Screw) IMPLANT
SCREW RELINE-O POLY 6.5X50MM (Screw) IMPLANT
SCREW RELINE-O POLY 7.5X45 (Screw) IMPLANT
SCREW SPINAL 8.5X80 RELINE (Screw) IMPLANT
SOLUTION IRRIG SURGIPHOR (IV SOLUTION) ×1 IMPLANT
SPACER HEDRON 18X55X9 10D (Screw) IMPLANT
STAPLER SKIN PROX 35W (STAPLE) IMPLANT
SURGIFLO W/THROMBIN 8M KIT (HEMOSTASIS) ×1 IMPLANT
SUT DVC VLOC 3-0 CL 6 P-12 (SUTURE) ×1 IMPLANT
SUT ETHILON 3-0 (SUTURE) IMPLANT
SUT V-LOC 90 ABS DVC 3-0 CL (SUTURE) IMPLANT
SUT VIC AB 0 CT1 27 (SUTURE) ×5
SUT VIC AB 0 CT1 27XCR 8 STRN (SUTURE) ×1 IMPLANT
SUT VIC AB 2-0 CT1 18 (SUTURE) ×1 IMPLANT
SYR 10ML LL (SYRINGE) ×1 IMPLANT
SYR 30ML LL (SYRINGE) ×2 IMPLANT
SYR 3ML LL SCALE MARK (SYRINGE) ×1 IMPLANT
TOWEL OR 17X26 4PK STRL BLUE (TOWEL DISPOSABLE) ×1 IMPLANT
TRAP FLUID SMOKE EVACUATOR (MISCELLANEOUS) ×1 IMPLANT
TRAY FOLEY SLVR 16FR LF STAT (SET/KITS/TRAYS/PACK) IMPLANT
WATER STERILE IRR 1000ML POUR (IV SOLUTION) ×2 IMPLANT

## 2023-01-26 NOTE — Anesthesia Preprocedure Evaluation (Signed)
Anesthesia Evaluation  Patient identified by MRN, date of birth, ID band Patient awake    Reviewed: Allergy & Precautions, H&P , NPO status , Patient's Chart, lab work & pertinent test results, reviewed documented beta blocker date and time   Airway Mallampati: II  TM Distance: >3 FB Neck ROM: full    Dental  (+) Teeth Intact   Pulmonary sleep apnea    Pulmonary exam normal        Cardiovascular Exercise Tolerance: Good hypertension, On Medications (-) angina (-) CAD, (-) Past MI, (-) CABG, (-) CHF, (-) Orthopnea, (-) PND and (-) DVT negative cardio ROS Normal cardiovascular exam Rhythm:regular Rate:Normal     Neuro/Psych  PSYCHIATRIC DISORDERS Anxiety Depression     Neuromuscular disease    GI/Hepatic ,GERD  Medicated,,(+) Hepatitis -, C  Endo/Other  negative endocrine ROS    Renal/GU Renal disease  negative genitourinary   Musculoskeletal   Abdominal   Peds  Hematology negative hematology ROS (+)   Anesthesia Other Findings Past Medical History: No date: Actinic keratosis No date: Anxiety     Comment:  a.) on BZO PRN (clonazepam) No date: Arthritis 10/09/2016: Basal cell carcinoma     Comment:  L sup med scapula  07/11/2019: Basal cell carcinoma     Comment:  L lat forehead above lat brow  08/29/2020: Basal cell carcinoma     Comment:  R prox medial pretibial, EDC 08/29/2020: Basal cell carcinoma     Comment:  R posterior ear, EDC 12/27/2020: BCC (basal cell carcinoma of skin)     Comment:  R epigastric - ED&C 12/07/2014: Benign essential HTN     Comment:  Last Assessment & Plan:  Relevant Hx: Course: Daily               Update: Today's Plan: CONTROLLED ON MEDS 03/31/2013: Benign prostatic hyperplasia with urinary obstruction No date: Bilateral sciatica No date: Chronic back pain     Comment:  a.) s/p multiple back surgeries 03/31/2013: Chronic inflammation of tunica albuginea No date: Cirrhosis  (HCC) 11/06/2014: DDD (degenerative disc disease), cervical 11/06/2014: DDD (degenerative disc disease), lumbar No date: Depression 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     Comment:  face, numerous lesions removed  12/07/2014: Esophagitis, reflux No date: GERD (gastroesophageal reflux disease)     Comment:  a.) s/p Nissen fundoplication No date: Hepatitis C virus infection cured after antiviral drug  therapy     Comment:  a.) s/p Tx with ledipasvir/sofosbuvir No date: History of kidney stones 03/01/2013: Idiopathic scoliosis and kyphoscoliosis No date: Leukopenia No date: Neuropathy of both feet (severe) No date: Painful legs and moving toes     Comment:  DUE TO BACK PROBLEMS No date: Prostatitis 11/06/2014: Sacroiliac joint dysfunction No date: Secondary erythrocytosis     Comment:  a.) secondary to exogenous testosterone use + OSA; b.)               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; c.) followed by hematology -->               requires therpeutic phlebotomy for Hct > 50 No date: Sleep apnea     Comment:  a.) no nocturnal PAP therapy 12/07/2014: Spinal stenosis 11/04/2011: Testicular dysfunction     Comment:  a.) on exogenous TRT (depotestosterone cypionate) Past Surgical History: 04/14/2013: BACK SURGERY     Comment:  L2-S1 No date: Cancer of right ear     Comment:  3/17 06/10/1987: CATARACT EXTRACTION; Left No date: CERVICAL SPINE SURGERY  Comment:   X2 FUSION C4-7 07/10/2013: CHOLECYSTECTOMY 12/18/2014: COLONOSCOPY WITH PROPOFOL; N/A     Comment:  Procedure: COLONOSCOPY WITH PROPOFOL;  Surgeon: [name], MD;  Location: [facility];  Service:               Endoscopy;  Laterality: N/A; 09/23/2021: COLONOSCOPY WITH PROPOFOL; N/A     Comment:  Procedure: COLONOSCOPY WITH PROPOFOL;  Surgeon:               [name], MD;  Location: [facility];                Service: Endoscopy;  Laterality: N/A; 04/02/2021: CYSTOSCOPY WITH INSERTION OF UROLIFT;  N/A     Comment:  Procedure: CYSTOSCOPY WITH INSERTION OF UROLIFT;                Surgeon: [name], MD;  Location: [facility];                Service: Urology;  Laterality: N/A; 02/08/2012: ELBOW ARTHROSCOPY; Right No date: ELBOW SURGERY; Left 12/18/2014: ESOPHAGOGASTRODUODENOSCOPY (EGD) WITH PROPOFOL; N/A     Comment:  Procedure: ESOPHAGOGASTRODUODENOSCOPY (EGD) WITH               PROPOFOL with dialtion;  Surgeon: [name], MD;                Location: [facility];  Service: Endoscopy;                Laterality: N/A; 09/23/2021: ESOPHAGOGASTRODUODENOSCOPY (EGD) WITH PROPOFOL; N/A     Comment:  Procedure: ESOPHAGOGASTRODUODENOSCOPY (EGD) WITH               PROPOFOL;  Surgeon: [name], MD;  Location:               [facility];  Service: Endoscopy;  Laterality: N/A; 06/09/1968: FACIAL RECONSTRUCTION SURGERY     Comment:  BOTTLE CUT NOSE AND EYE, MULTIPLE SURGERIES 06/09/2013: FINGER SURGERY; Left     Comment:  THUMB 06/09/1964: FINGER SURGERY; Right     Comment:  MIDDLE 06/09/1982: FOOT SURGERY; Right 06/10/1999: FUNDIPLICATION 1981/1957: HERNIA REPAIR; Left     Comment:  RIGHT SIDE IN 1957 2021: JOINT REPLACEMENT; Left     Comment:  knee 02/10/2020: KNEE ARTHROPLASTY; Left     Comment:  Procedure: COMPUTER ASSISTED TOTAL KNEE ARTHROPLASTY -               RNFA;  Surgeon: Donato Heinz, MD;  Location: [facility];              Service: Orthopedics;  Laterality: Left; 10/24/2022: KNEE ARTHROPLASTY; Right     Comment:  Procedure: COMPUTER ASSISTED TOTAL KNEE ARTHROPLASTY;                Surgeon: Donato Heinz, MD;  Location: [facility];                Service: Orthopedics;  Laterality: Right; 08/14/2021: KNEE ARTHROSCOPY; Right     Comment:  Procedure: ARTHROSCOPY KNEE, medial menisectomy and               chondroplasty, loose bodies removal;  Surgeon: Donato Heinz, MD;  Location: [facility];  Service: Orthopedics;  Laterality:  Right; 02/16/2015: MASS EXCISION; Left     Comment:  Procedure: LEFT BURSA ELBOW EXCISION;  Surgeon: Erin Sons, MD;  Location: [facility];  Service:               Orthopedics;  Laterality: Left; 06/10/1999: NISSEN FUNDOPLICATION 12/01/2002: NOSE SURGERY     Comment:  SEPTUM RECONSTRUCTION 12/18/2014: POLYPECTOMY     Comment:  Procedure: POLYPECTOMY;  Surgeon: [name], MD;                Location: [facility];  Service: Endoscopy;; 06/09/1997: SEPTOPLASTY 06/09/1981: SHOULDER SURGERY; Right 06/09/1977: SHOULDER SURGERY; Left     Comment:  WITH ELBOW (TENDON RELEASE) 06/09/2002: SKIN SURGERY     Comment:  SKIN CANCER NOSE BMI    Body Mass Index: 33.70 kg/m     Reproductive/Obstetrics negative OB ROS                             Anesthesia Physical Anesthesia Plan  ASA: 3  Anesthesia Plan: General ETT   Post-op Pain Management:    Induction:   PONV Risk Score and Plan: 3  Airway Management Planned:   Additional Equipment:   Intra-op Plan:   Post-operative Plan:   Informed Consent: I have reviewed the patients History and Physical, chart, labs and discussed the procedure including the risks, benefits and alternatives for the proposed anesthesia with the patient or authorized representative who has indicated his/her understanding and acceptance.     Dental Advisory Given  Plan Discussed with: CRNA  Anesthesia Plan Comments:        Anesthesia Quick Evaluation

## 2023-01-26 NOTE — H&P (Signed)
Referring Physician:  No referring provider defined for this encounter.  Primary Physician:  [name], NP  History of Present Illness: 01/26/2023 [name] presents to see me with continued symptoms.  12/16/2022 [name] returns to see me.  He had his right knee replaced on May 17 by Dr. Ernest Pine.  He continues to have severe symptoms as noted below.  Of note, he has been in physical therapy and was discharged due to lack of improvement.  He comes in today with worsening balance and low back symptoms.   10/21/2022 [name] is here today with a chief complaint of low back pain that radiates into the bilateral legs.  He has a long history of back pain but is been worsening recently.  His pain has been ongoing for multiple decades.  He had back surgery 5 years ago, and did well from that.  More recently, he has pain in the back of his legs as well as in his groin area that is worsened with walking and certain movements.  It can be very severe but is currently 5 out of 10.  Sitting down makes it better.  He tried physical therapy without improvement and was discharged.  He is at the point where this is impacting his day-to-day life.  He would like to consider what options are available for him.   Bowel/Bladder Dysfunction: none  Conservative measures:  Physical therapy: has participated in 2 visits at [facility] and was discharged.  Multimodal medical therapy including regular antiinflammatories:  celebrex Injections: has received epidural steroid injections 04/19/21: Bilateral S1 TF ESI (no relief) 03/29/21: Bilateral S1 TF ESI (no relief)  Past Surgery:  - lumbar laminectomy and facetectomy at [facility] in 2014 with Dr. [name]. Also with history of posterior cervical laminectomy and foraminotomy with Dr. [name]. He also had ACDF C3-C6 along with posterior cervical fusion C3-C6. - L3-S1 fusion in 2019 with Dr. Chilton Si has no symptoms  of cervical myelopathy.  The symptoms are causing a significant impact on the patient's life.   Progress Note from [name], [place] on 08/27/22:  History of Present Illness: [name] is a 72 y.o. male has a history of sleep apnea, skin CA, hep C, GERD, gout, HTN, peripheral neuropathy.    He has history of lumbar laminectomy and facetectomy at [facility] in 2014 with Dr. [name]. Also with history of posterior cervical laminectomy and foraminotomy with Dr. [name]. He also had ACDFC3-C6 along with posterior cervical fusion C3-C6.   He subsequently underwent L3-S1 fusion in 2019 with Dr. [name]. He has seen Dr. [name] in the past.    History of chronic LBP x years with bilateral leg pain in his thighs. He has known knee OA in right knee and painful left TKA. He also has known neuropathy in both arms/legs.    No change in his symptoms. He has constant LBP and intermittent left and right leg pain mostly into his thighs, sometimes into the calf. He notes intermittent spasms in his back and he has to sit down to rest when he has these. He can't walk more than a block or so because of his back pain.    Phone visit scheduled to review his lumbar xray results.   I have utilized the care everywhere function in epic to review the outside records available from external health systems.  Review of Systems:  A 10 point review of systems is negative, except for  the pertinent positives and negatives detailed in the HPI.  Past Medical History: Past Medical History:  Diagnosis Date   Actinic keratosis    Anxiety    a.) on BZO PRN (clonazepam)   Arthritis    Basal cell carcinoma 10/09/2016   L sup med scapula    Basal cell carcinoma 07/11/2019   L lat forehead above lat brow    Basal cell carcinoma 08/29/2020   R prox medial pretibial, ED[facility]   Basal cell carcinoma 08/29/2020   R posterior ear, [facility]   BCC (basal cell carcinoma of skin) 12/27/2020   R epigastric - ED&C   Benign essential HTN  12/07/2014   Last Assessment & Plan:  Relevant Hx: Course: Daily Update: Today's Plan: CONTROLLED ON MEDS   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/31/2013   Bilateral sciatica    Chronic back pain    a.) s/p multiple back surgeries   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   Cirrhosis (HCC)    DDD (degenerative disc disease), cervical 11/06/2014   DDD (degenerative disc disease), lumbar 11/06/2014   Depression    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   face, numerous lesions removed    Esophagitis, reflux 12/07/2014   GERD (gastroesophageal reflux disease)    a.) s/p Nissen fundoplication   Hepatitis C virus infection cured after antiviral drug therapy    a.) s/p Tx with ledipasvir/sofosbuvir   History of kidney stones    Idiopathic scoliosis and kyphoscoliosis 03/01/2013   Leukopenia    Neuropathy of both feet (severe)    Painful legs and moving toes    DUE TO BACK PROBLEMS   Prostatitis    Sacroiliac joint dysfunction 11/06/2014   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; b.) 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; c.) followed by hematology --> requires therpeutic phlebotomy for Hct > 50   Sleep apnea    a.) no nocturnal PAP therapy   Spinal stenosis 12/07/2014   Testicular dysfunction 11/04/2011   a.) on exogenous TRT (depotestosterone cypionate)    Past Surgical History: Past Surgical History:  Procedure Laterality Date   BACK SURGERY  04/14/2013   L2-S1   Cancer of right ear     3/17   CATARACT EXTRACTION Left 06/10/1987   CERVICAL SPINE SURGERY      X2 FUSION C4-7   CHOLECYSTECTOMY  07/10/2013   COLONOSCOPY WITH PROPOFOL N/A 12/18/2014   Procedure: COLONOSCOPY WITH PROPOFOL;  Surgeon: [name], MD;  Location: [facility];  Service: Endoscopy;  Laterality: N/A;   COLONOSCOPY WITH PROPOFOL N/A 09/23/2021   Procedure: COLONOSCOPY WITH PROPOFOL;  Surgeon: [name], MD;  Location: [facility];   Service: Endoscopy;  Laterality: N/A;   CYSTOSCOPY WITH INSERTION OF UROLIFT N/A 04/02/2021   Procedure: CYSTOSCOPY WITH INSERTION OF UROLIFT;  Surgeon: [name], MD;  Location: [facility];  Service: Urology;  Laterality: N/A;   ELBOW ARTHROSCOPY Right 02/08/2012   ELBOW SURGERY Left    ESOPHAGOGASTRODUODENOSCOPY (EGD) WITH PROPOFOL N/A 12/18/2014   Procedure: ESOPHAGOGASTRODUODENOSCOPY (EGD) WITH PROPOFOL with dialtion;  Surgeon: [name], MD;  Location: [facility];  Service: Endoscopy;  Laterality: N/A;   ESOPHAGOGASTRODUODENOSCOPY (EGD) WITH PROPOFOL N/A 09/23/2021   Procedure: ESOPHAGOGASTRODUODENOSCOPY (EGD) WITH PROPOFOL;  Surgeon: [name], MD;  Location: [facility];  Service: Endoscopy;  Laterality: N/A;   FACIAL RECONSTRUCTION SURGERY  06/09/1968   BOTTLE CUT NOSE AND EYE, MULTIPLE SURGERIES   FINGER SURGERY Left 06/09/2013  THUMB   FINGER SURGERY Right 06/09/1964   MIDDLE   FOOT SURGERY Right 06/09/1982   FUNDIPLICATION  06/10/1999   HERNIA REPAIR Left 1981/1957   RIGHT SIDE IN 1957   JOINT REPLACEMENT Left 2021   knee   KNEE ARTHROPLASTY Left 02/10/2020   Procedure: COMPUTER ASSISTED TOTAL KNEE ARTHROPLASTY - RNFA;  Surgeon: Donato Heinz, MD;  Location: [facility];  Service: Orthopedics;  Laterality: Left;   KNEE ARTHROPLASTY Right 10/24/2022   Procedure: COMPUTER ASSISTED TOTAL KNEE ARTHROPLASTY;  Surgeon: Donato Heinz, MD;  Location: [facility];  Service: Orthopedics;  Laterality: Right;   KNEE ARTHROSCOPY Right 08/14/2021   Procedure: ARTHROSCOPY KNEE, medial menisectomy and chondroplasty, loose bodies removal;  Surgeon: Donato Heinz, MD;  Location: [facility];  Service: Orthopedics;  Laterality: Right;   MASS EXCISION Left 02/16/2015   Procedure: LEFT BURSA ELBOW EXCISION;  Surgeon: Erin Sons, MD;  Location: [facility];  Service: Orthopedics;  Laterality: Left;   NISSEN FUNDOPLICATION  06/10/1999   NOSE SURGERY  12/01/2002    SEPTUM RECONSTRUCTION   POLYPECTOMY  12/18/2014   Procedure: POLYPECTOMY;  Surgeon: [name], MD;  Location: [facility];  Service: Endoscopy;;   SEPTOPLASTY  06/09/1997   SHOULDER SURGERY Right 06/09/1981   SHOULDER SURGERY Left 06/09/1977   WITH ELBOW (TENDON RELEASE)   SKIN SURGERY  06/09/2002   SKIN CANCER NOSE    Allergies: Allergies as of 12/29/2022 - Review Complete 12/16/2022  Allergen Reaction Noted   Gabapentin Other (See Comments) 03/27/2022   Lamotrigine Other (See Comments) 03/27/2022    Medications:  Current Facility-Administered Medications:    ceFAZolin (ANCEF) IVPB 2g/100 mL premix, 2 g, Intravenous, 60 min Pre-Op, Venetia Night, MD   lactated ringers infusion, , Intravenous, Continuous, Piscitello, Cleda Mccreedy, MD, Last Rate: 10 mL/hr at 01/26/23 0636, New Bag at 01/26/23 0636   vancomycin (VANCOCIN) IVPB 1000 mg/200 mL premix, 1,000 mg, Intravenous, Once, Venetia Night, MD, Last Rate: 200 mL/hr at 01/26/23 0635, 1,000 mg at 01/26/23 7846  Social History: Social History   Tobacco Use   Smoking status: Never   Smokeless tobacco: Never  Vaping Use   Vaping status: Never Used  Substance Use Topics   Alcohol use: Yes    Alcohol/week: 1.0 standard drink of alcohol    Types: 1 Glasses of wine per week    Comment: 1-2 drinks every night   Drug use: No    Family Medical History: Family History  Problem Relation Age of Onset   Arthritis Mother    Asthma Mother    Cancer Mother    Heart disease Father     Physical Examination: Vitals:   01/26/23 0617  BP: 126/88  Pulse: 77  Resp: 16  Temp: 97.6 F (36.4 C)  SpO2: 92%   Heart sounds normal no MRG. Chest Clear to Auscultation Bilaterally.   General: Patient is in no apparent distress. Attention to examination is appropriate.  Neck:   Supple.  Full range of motion.  Respiratory: Patient is breathing without any difficulty.   NEUROLOGICAL:     Awake, alert, oriented to  person, place, and time.  Speech is clear and fluent.   Cranial Nerves: Pupils equal round and reactive to light.  Facial tone is symmetric.  Facial sensation is symmetric. Shoulder shrug is symmetric. Tongue protrusion is midline.    Strength: Side Biceps Triceps Deltoid Interossei Grip Wrist Ext. Wrist Flex.  R 5 5 5 5 5 5 5   L  5 5 5 5 5 5 5    Side Iliopsoas Quads Hamstring PF DF EHL  R 5 5 5 5 5 5   L 5 5 5 5 5 5    Reflexes are 1+ and symmetric at the biceps, triceps, brachioradialis, patella and achilles.   Hoffman's is absent.   Bilateral upper and lower extremity sensation is intact to light touch with except of below knee which is diminished bilaterally.    No evidence of dysmetria noted.  Gait is antalgic and wide-based.  He is unsteady.     Medical Decision Making  Imaging: MRI L spine 08/08/2022 IMPRESSION: 1. Diffuse lumbar spine spondylosis as described above. 2. At T12-L1 there is a broad-based disc bulge with a left foraminal/lateral disc protrusion. Mild bilateral facet arthropathy. Mild-moderate spinal stenosis. Moderate left foraminal stenosis. Mild right foraminal stenosis. 3. At L1-2 there is a broad-based disc bulge. Moderate bilateral facet arthropathy. Mild spinal stenosis. Bilateral lateral recess stenosis. Moderate bilateral foraminal stenosis. 4. At L2-3 there is a broad-based disc bulge. Prior laminectomy. Mild spinal stenosis. Bilateral lateral recess stenosis. Severe right and moderate left foraminal stenosis. 5. At L4-5 there is a broad-based disc bulge. Prior laminectomy. Moderate bilateral foraminal stenosis. 6. At L5-S1 there is a broad-based disc bulge. Prior laminectomy. Severe bilateral foraminal stenosis. 7. No acute osseous injury of fusion with multilevel foraminal spine.     Electronically Signed   By: Elige Ko M.D.   On: 08/09/2022 07:23  L spine flex ext 08/25/2022 IMPRESSION: 1. Interval L3-S1 posterior lumbar fusion with  instrumentation. 2. Marked, progressive degenerative disc disease at L2-3 with development of 10 mm fixed retrolisthesis.     Electronically Signed   By: Helyn Numbers M.D.   On: 08/25/2022 01:45  CT L spine 12/05/2022 IMPRESSION: 1. Prior decompression and fusion from L3-L4 through L5-S1. Solid arthrodesis at L3-L4 and L4-L5. But loosening of both S1 pedicle screws and pseudoarthrosis at L5-S1.   2. 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   3. Advanced T12-L1 disc and endplate degeneration with spinal stenosis at the conus medullaris, see details on Thoracic MRI the same day reported separately.   4.  Aortic Atherosclerosis (ICD10-I70.0).     Electronically Signed   By: Odessa Fleming M.D.   On: 12/05/2022 09:05  MRI T spine 12/05/2022 IMPRESSION: 1. Widespread thoracic spine degeneration. Degenerative endplate marrow edema at T12-L1. No other acute osseous abnormality.   2. Multifactorial spinal stenosis at T11-T12 (mild) and T12-L1 (mild-to-moderate) and with up to mild mass effect on the conus medullaris at the latter. No conus or thoracic spinal cord signal abnormality.   3. No other thoracic spinal stenosis. Multilevel thoracic neural foraminal stenosis primarily related to facet hypertrophy; Severe at the left T8 and bilateral T9 nerve levels.   4. Chronic cervical ACDF. Partially visible adjacent segment disease at C3-C4 with suspected mild spinal stenosis. 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     Electronically Signed   By: Odessa Fleming M.D.   On: 12/05/2022 08:42   I have personally reviewed the images and agree with the above interpretation.  Assessment and Plan: [name] is a pleasant 72 y.o. 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   Compared to his prior x-rays, he has had substantial degenerative changes in his L2-3  disc space.  He has multilevel foraminal stenosis that could be causing sciatica on both sides.  He suffers from back pain with sciatica bilaterally.  Additionally, he has thoracic stenosis at T12-L1 with substantial degenerative disc disease at T12-L1.  His CT scan shows an obvious pseudoarthrosis at L5-S1.  I think he is having symptoms from his symptomatic pseudoarthrosis, retrolisthesis of L2-3, thoracic stenosis, and multilevel lumbar compression.  We will proceed with L2-3 lateral lumbar interbody fusion to help correct his retrolisthesis followed by T10 to the pelvis posterior spinal fusion with removal and replacement of his implants at L3-S1, L5-S1 transforaminal lumbar interbody fusion to salvage the L5-S1 disc space, and T12-L2 decompression to address his thoracic and lumbar compression.     Carolynne Schuchard K. Myer Haff MD, [facility] Neurosurgery

## 2023-01-26 NOTE — Anesthesia Procedure Notes (Signed)
Arterial Line Insertion Start/End8/19/2024 7:45 AM, 01/26/2023 8:00 AM Performed by: Yevette Edwards, MD, Omer Jack, CRNA, anesthesiologist  Preanesthetic checklist: patient identified, IV checked, site marked, risks and benefits discussed, monitors and equipment checked, pre-op evaluation and timeout performed radial was placed Catheter size: 20 G Hand hygiene performed  Allen's test indicative of satisfactory collateral circulation Attempts: 2 Procedure performed without using ultrasound guided technique. Following insertion, dressing applied and Biopatch. Post procedure assessment: normal and unchanged  Post procedure complications: unsuccessful attempts and second provider assisted. Patient tolerated the procedure well with no immediate complications.

## 2023-01-26 NOTE — Transfer of Care (Signed)
Immediate Anesthesia Transfer of Care Note  Patient: CADAN GRAVETT  Procedure(s) Performed: Procedure(s): L2-3 LATERAL LUMBAR INTERBODY FUSION (N/A) T10-PELVIS POSTERIOR SPINAL FUSION (N/A) T12-L1 DECOMPRESSION, L1-2 POSTERIOR SPINAL DECOMPRESSION (N/A) L5-S1 TRANSFORAMINAL LUMBAR INTERBODY FUSION (TLIF) (N/A) APPLICATION OF INTRAOPERATIVE CT SCAN (N/A) APPLICATION OF CELL SAVER (N/A)  Patient Location: PACU  Anesthesia Type:General  Level of Consciousness: sedated  Airway & Oxygen Therapy: Patient Spontanous Breathing and Patient connected to face mask oxygen  Post-op Assessment: Report given to RN and Post -op Vital signs reviewed and stable  Post vital signs: Reviewed and stable  Last Vitals:  Vitals:   01/26/23 1630 01/26/23 1646  BP: 106/69 115/72  Pulse: 64 65  Resp: 14 (!) 26  Temp:    SpO2:  94%    Complications: No apparent anesthesia complications

## 2023-01-26 NOTE — Anesthesia Postprocedure Evaluation (Signed)
Anesthesia Post Note  Patient: [name]  Procedure(s) Performed: L2-3 LATERAL LUMBAR INTERBODY FUSION T10-PELVIS POSTERIOR SPINAL FUSION T12-L1 DECOMPRESSION, L1-2 POSTERIOR SPINAL DECOMPRESSION L5-S1 TRANSFORAMINAL LUMBAR INTERBODY FUSION (TLIF) APPLICATION OF INTRAOPERATIVE CT SCAN APPLICATION OF CELL SAVER  Patient location during evaluation: PACU Anesthesia Type: General Level of consciousness: awake and alert Pain management: pain level controlled Vital Signs Assessment: post-procedure vital signs reviewed and stable Respiratory status: spontaneous breathing, nonlabored ventilation, respiratory function stable and patient connected to nasal cannula oxygen Cardiovascular status: blood pressure returned to baseline and stable Postop Assessment: no apparent nausea or vomiting Anesthetic complications: no   No notable events documented.   Last Vitals:  Vitals:   01/26/23 1700 01/26/23 1715  BP: 118/82 124/87  Pulse: 67 70  Resp: 18 18  Temp:    SpO2: 98% 94%    Last Pain:  Vitals:   01/26/23 1715  TempSrc:   PainSc: Asleep                 Louie Boston

## 2023-01-26 NOTE — Anesthesia Procedure Notes (Signed)
Procedure Name: Intubation Date/Time: 01/26/2023 7:42 AM  Performed by: Omer Jack, CRNAPre-anesthesia Checklist: Patient identified, Patient being monitored, Timeout performed, Emergency Drugs available and Suction available Patient Re-evaluated:Patient Re-evaluated prior to induction Oxygen Delivery Method: Circle system utilized Preoxygenation: Pre-oxygenation with 100% oxygen Induction Type: IV induction Ventilation: Mask ventilation without difficulty Laryngoscope Size: Mac, McGraph and 4 Grade View: Grade I Tube type: Oral Tube size: 7.5 mm Number of attempts: 1 Airway Equipment and Method: Stylet Placement Confirmation: ETT inserted through vocal cords under direct vision, positive ETCO2 and breath sounds checked- equal and bilateral Secured at: 22 cm Tube secured with: Tape Dental Injury: Teeth and Oropharynx as per pre-operative assessment

## 2023-01-26 NOTE — Op Note (Signed)
Indications: [name] is a 72 y.o. 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   Preoperative Note:   Risks of surgery discussed include: infection, bleeding, stroke, coma, death, paralysis, CSF leak, nerve/spinal cord injury, numbness, tingling, weakness, complex regional pain syndrome, recurrent stenosis and/or disc herniation, vascular injury, development of instability, neck/back pain, need for further surgery, persistent symptoms, development of deformity, and the risks of anesthesia. The patient understood these risks and agreed to proceed.  NAME OF ANTERIOR PROCEDURE:               1. Anterior lumbar interbody fusion via a right lateral retroperitoneal approach at L2/3 2. Placement of a Lordotic Globus Hedron interbody cage, filled with Demineralized Bone Matrix  NAME OF POSTERIOR PROCEDURE 1. Posterior instrumentation using Nuvasive Reline Instrumentation 2. Posterolateral fusion, T10-pelvis, utilizing demineralized bone matrix and Bone Morphogenetic Protein 3. Use of Stereotaxis 4.  T12-L1 posterior decompression including medial facetectomies  5.  L1-2 posterior decompression  including medial facetectomies 6.  L5-S1 transforaminal lumbar interbody fusion   PROCEDURE:  Patient was brought to the operating room, intubated, turned to the lateral position.  All pressure points were checked and double-checked.  The patient was prepped and draped in the standard fashion. Prior to prepping, fluoroscopy was brought in and the patient was positioned with a large bump under the contralateral side between the iliac crest and rib cage, allowing the area between the iliac crest and the lateral aspect of the rib cage to open and increase the ability to reach inferiorly, to facilitate entry into the disc space.  The incision was marked upon the skin both the location of the disc space as well as the superior most aspect of the iliac crest.  Based on the identification of the disc space an incision was prepared, marked upon the skin and eventually was used for our lateral incision.  The fluoroscopy was turned into a cross table A/P image in order to confirm that the patient's spine remained in a perpendicular trajectory to the floor without rotation.  Once confirming that all the pressure points were checked and double-checked and the patient remained in sturdy position strapped down in this slightly jack-knifed lateral position, the patient was prepped and draped in standard fashion.  The skin was injected with local anesthetic, then incised until the abdominal wall fascia was noted.  I bluntly dissected posteriorly until we were able to identify the posterior musculature near petit's triangle.  At this point, using primarily blunt dissection with our finger aided with a metzenbaum scissor, were able to enter the retroperitoneal cavity.  The retroperitoneal potential space was opened further until palpating out the psoas muscle, the medial aspect of the iliac crest, the medial aspect of the last rib and continued to define the retroperitoneal space with blunt dissection in order to facilitate  safe placement of our dilators.    While protecting by dissecting directly onto a finger in the retroperitoneum, the retroperitoneal space was entered safely from the lateral incision and the initial dilator placed onto the muscle belly of the psoas.  While directly stimulating the dilator and after radiographically confirming our location relative to the disc space, I placed the dilator through the psoas.  The dilators were stimulated to ensure remaining safely away from any of the lumbar plexus nerves; the dilators were repositioned until no pathologic stimulation was appreciated.  Once I had confirmed the location of our initial dilator radiographically, a K-wire secured the dilator into the L2/3 disc space and confirmed position under A/P and lateral fluoroscopy.  At this point, I dilated up with direct stimulation to confirm lack of pathologic stimulation.  Once all the dilators were in position, I placed in the retractor and secured it onto the table, locked into position and confirmed under A/P and lateral fluoroscopy to confirm our approach angle to the disc space as well as location relative to the disc space.  I then placed the muscle stimulator in through the working channel down to the vertebral body, stimulating the entire lateral surface of the vertebral body and any of the visualized psoas muscle that was adjacent to the retractor, confirming again the safe passage to the psoas before we began performing the discectomy.  At this point, we began our discectomy at L2/3.  The disc was incised laterally throughout the extent of our exposure. Using a combination of pituitary rongeurs, Kerrison rongeurs, rasps, curettes of various sorts, we were able to begin to clean out the disc space.  Once we had cleaned out the majority of the disc space, we then cut the lateral annulus with a cob, breaking the lateral annual attachments on the contralateral side by subtly working the cob through the annulus  while using flouroscopy.  Care was taken not to extend further than required after cutting the annular attachments.  After this had been performed, we prepared the endplates for placement of our graft, sized a graft to the disc space by serially dilating up in trial sizes until we confirmed that our graft would be well positioned, allowing distraction while maintaining good grip.  This was confirmed under A/P and lateral fluoroscopy in order to ensure its placement as an eventual trial for placement of our final graft.  We irrigated with bacteriostatic saline.  Once confirmed placement, the Hedron implant filled with allograft was impacted into position at L2/3.   Through a combination of intradiscal distraction and anterior releasing, we were able to correct the anterior deformity during disc preparation and placement of the graft.  At this point, final radiographs were performed, and we began closure.  The wound was closed using 0 Vicryl interrupted suture in the fascia and 2-0 Vicryl inverted suture were placed in the subcutaneous tissue and dermis. 3-0 monocryl was used for final closure. Dermabond was used to close the skin.    After closing the anterior part in layers, the patient was repositioned into prone position.  All pressure points were checked and double-checked.  The posterior operative site was prepped and draped in standard fashion.    The prior incision was identified.  It was opened and extended superiorly until the posterior elements from T10 to the pelvis were exposed.  The prior implants were exposed and removed.  The stereotactic array was then placed. Stereotactic images were acquired using intraoperative CT scanning.  This  was registered to the patient.  Using stereotaxis, S2 alar iliac screws were placed.  As this was the inferior most portion of the instrumentation, the fusion construct ended in the pelvis.  After placement of alar iliac screws, the the pedicle screws were  replaced from L3-S1.  The left L4 screw was noted to have a medial tract.  This was left out.  We then utilized a new registration and CT scan to place pedicle screws from T10-L2 bilaterally.  At each level, stereotactic drill guides were used to cannulate the pedicles bilaterally.  Pedicle finder probes were used to palpate and confirm lack of breach.  After placement of pedicle screws, a screw-to-screw distractor was placed to distract the L5/S1 disc space. We then turned attention to performing the transforaminal decompression and interbody fusion. The right L5/S1 facet was removed with osteotomes and the drill, and handed off for preparation as autograft. The traversing and exiting nerve roots on the right were identified and protected. The disc was opened using a scalpel. After incising the disc space, we took a combination of pituitary rongeurs, Kerrison rongeurs, disc scrapers, and curettes to remove a majority of the disc material.  We prepared the end plates for accepting the interbody fusion.  We removed the cartilaginous plate, preserved the cortical endplate if possible during this procedure.  We serially dilated up in order to increase the size of the disc space, while protecting the traversing and exiting nerve roots, until we had sized up to a trial.    The trial was removed, and the disc space packed with autograft and allograft. The Globus Sable TLIF biomechanical device was inserted, then backfilled with allograft, with care taken to protect the nerve roots and thecal sac. After placement of the device, the screw-screw distractor was removed.  After performing the transforaminal lumbar interbody fusion at L5-S1, we moved to decompression at T12-L1 and L1-2.  Using high-speed drill, a trough laminectomy was drilled to remove the entire L1 lamina as well as the inferior two thirds of the T12 lamina.  This was removed and handed off for use as autograft later in the case.  The leading edge of  the L2 lamina was also drilled down and eggshell.  Using 2 and 3 mm Kerrison punches, the ligamentum flavum was removed and the neural elements completely decompressed.  Medial facetectomy was performed at both levels, as well as foraminotomies.  Thus, posterior decompression T12-L2 was performed including medial facetectomy and foraminotomies at each level bilaterally.  All implants were checked using radiographs and intraoperative CT scanning.  After checking all implants, rods were placed and secured to the screws according to manufacturer specifications.  We copiously irrigated and confirmed hemostasis.  Using high-speed drill, the external surfaces of the bones were decorticated.  A combination of allograft, autograft, and bone morphogenic protein was used to obtain arthrodesis from T10 to the pelvis.  We then moved on to closure.  2 deep drains were placed.  The wound was closed in layers using 0 and 2-0 Vicryl.  3-0 Monocryl in a wound VAC were placed on the incision.  All counts were correct x 2.   Manning Charity PA assisted in the entire procedure. An assistant was required for this procedure due to the complexity.  The assistant provided assistance in tissue manipulation and suction, and was required for the successful and safe performance of the procedure. I performed the critical portions of the procedure.   Venetia Night MD Neurosurgery

## 2023-01-27 ENCOUNTER — Encounter: Payer: Self-pay | Admitting: Neurosurgery

## 2023-01-27 ENCOUNTER — Other Ambulatory Visit: Payer: Self-pay

## 2023-01-27 LAB — CBC
HCT: 41 % (ref 39.0–52.0)
Hemoglobin: 14.1 g/dL (ref 13.0–17.0)
MCH: 30.9 pg (ref 26.0–34.0)
MCHC: 34.4 g/dL (ref 30.0–36.0)
MCV: 89.7 fL (ref 80.0–100.0)
Platelets: 166 10*3/uL (ref 150–400)
RBC: 4.57 MIL/uL (ref 4.22–5.81)
RDW: 13.1 % (ref 11.5–15.5)
WBC: 11.9 10*3/uL — ABNORMAL HIGH (ref 4.0–10.5)
nRBC: 0 % (ref 0.0–0.2)

## 2023-01-27 LAB — BASIC METABOLIC PANEL
Anion gap: 8 (ref 5–15)
BUN: 16 mg/dL (ref 8–23)
CO2: 21 mmol/L — ABNORMAL LOW (ref 22–32)
Calcium: 7.9 mg/dL — ABNORMAL LOW (ref 8.9–10.3)
Chloride: 109 mmol/L (ref 98–111)
Creatinine, Ser: 1.05 mg/dL (ref 0.61–1.24)
GFR, Estimated: >60 mL/min (ref >60)
Glucose, Bld: 155 mg/dL — ABNORMAL HIGH (ref 70–99)
Potassium: 4.2 mmol/L (ref 3.5–5.1)
Sodium: 138 mmol/L (ref 135–145)

## 2023-01-27 NOTE — Plan of Care (Signed)
  Problem: Pain Managment: Goal: General experience of comfort will improve Outcome: Progressing   Problem: Safety: Goal: Ability to remain free from injury will improve Outcome: Progressing   Problem: Skin Integrity: Goal: Risk for impaired skin integrity will decrease Outcome: Progressing   Problem: Activity: Goal: Ability to avoid complications of mobility impairment will improve Outcome: Progressing Goal: Ability to tolerate increased activity will improve Outcome: Progressing Goal: Will remain free from falls Outcome: Progressing

## 2023-01-27 NOTE — Progress Notes (Addendum)
0730 Awake and alert. 0900 Working with PT to get OOB. Walked with walker in hall. Up in chair as well. 1130 Medicated for pain and anxiety. Good appetite and drinking well. 1515 Foley discontinued and changed to male purewic. Patient and family told of transfer. Report called to [facility] RN on 1A. 1600 Transferred to 142 via bed.

## 2023-01-27 NOTE — Evaluation (Signed)
Occupational Therapy Evaluation Patient Details Name: [name] MRN: [id number] DOB: 1950-06-27 Today's Date: 01/27/2023   History of Present Illness Pt is a 72 y/o male presenting s/p spinal fusion (per op note L2-3 lateral lumbar interbody fusion, T10-pelvis posterior spinal fusion, T12-L1 decrompression, L1-2 posterior spinal decompression, L5-S1 transforaminal lumbar interbody fusion). Initial findings prior to surgery included pseudoarthrosis, retrolisthesis L2-3, thoracic stenosis, and multilevel lumbar compression. PMH includes multiple back surgeries/fusions, R TKA (10/25/22), L TKA, skin cancer, hepatitis C, GERD, gout, HTN, peripheral neuropathy, sleep apnea, and chronic LBP with sciatica.   Clinical Impression   Mr Gulden was seen for OT evaluation this date. Prior to hospital admission, pt was IND. Pt lives with spouse. Pt currently requires MOD A don TLSO in sitting, spouse return demonstrated with MIN cues. MAX A don/doff socks and compression socks. SUPERVISION + RW for ADL t/f. Pt educated in functional application of back precautions, log roll technique, AE/DME for bathing/dressing/ toileting, and home/routines modifications. Pt verbalized understanding of all education/training provided. All education complete, will sign off. Upon hospital discharge, recommend no OT follow up.    If plan is discharge home, recommend the following: A little help with walking and/or transfers;A little help with bathing/dressing/bathroom    Functional Status Assessment  Patient has had a recent decline in their functional status and demonstrates the ability to make significant improvements in function in a reasonable and predictable amount of time.  Equipment Recommendations  None recommended by OT    Recommendations for Other Services       Precautions / Restrictions Precautions Precautions: Back Precaution Comments: BLT for comfort, TLSO brace with OOB mobility/ for  comfort Required Braces or Orthoses: Spinal Brace Spinal Brace: Thoracolumbosacral orthotic Restrictions Weight Bearing Restrictions: No      Mobility Bed Mobility Overal bed mobility: Needs Assistance Bed Mobility: Rolling, Sidelying to Sit, Sit to Sidelying Rolling: Contact guard assist Sidelying to sit: Min assist     Sit to sidelying: Contact guard assist      Transfers Overall transfer level: Needs assistance Equipment used: Rolling walker (2 wheels) Transfers: Sit to/from Stand, Bed to chair/wheelchair/BSC Sit to Stand: Supervision                  Balance Overall balance assessment: Needs assistance Sitting-balance support: No upper extremity supported, Feet supported Sitting balance-Leahy Scale: Good     Standing balance support: Bilateral upper extremity supported Standing balance-Leahy Scale: Fair                             ADL either performed or assessed with clinical judgement   ADL Overall ADL's : Needs assistance/impaired                                       General ADL Comments: MOD A don TLSO in sitting, spouse return demonstrated with MIN cues. MAX A don/doff socks and compression socks. SUPERVISION + RW simulated toilet t/f      Pertinent Vitals/Pain Pain Assessment Pain Assessment: Faces Faces Pain Scale: Hurts little more Pain Location: back/ hip Pain Descriptors / Indicators: Discomfort, Constant Pain Intervention(s): Limited activity within patient's tolerance, Repositioned     Extremity/Trunk Assessment Upper Extremity Assessment Upper Extremity Assessment: Overall WFL for tasks assessed   Lower Extremity Assessment Lower Extremity Assessment: Overall WFL for tasks assessed  Communication Communication Communication: No apparent difficulties   Cognition Arousal: Alert Behavior During Therapy: WFL for tasks assessed/performed Overall Cognitive Status: Within Functional Limits for tasks  assessed                                       General Comments  stable on RA            Home Living Family/patient expects to be discharged to:: Private residence Living Arrangements: Spouse/significant other Available Help at Discharge: Family;Available 24 hours/day Type of Home: House Home Access: Stairs to enter [employer] of Steps: 1 Entrance Stairs-Rails: None Home Layout: One level     Bathroom Shower/Tub: Producer, television/film/video: Handicapped height Bathroom Accessibility: Yes   Home Equipment: Cane - single point;Rollator (4 wheels)          Prior Functioning/Environment Prior Level of Function : Independent/Modified Independent             Mobility Comments: Did not use AD for mobility          OT Problem List: Decreased strength;Decreased range of motion;Decreased activity tolerance         OT Goals(Current goals can be found in the care plan section) Acute Rehab OT Goals Patient Stated Goal: to go home OT Goal Formulation: With patient/family Time For Goal Achievement: 02/10/23 Potential to Achieve Goals: Good   AM-PAC OT "6 Clicks" Daily Activity     Outcome Measure Help from another person eating meals?: None Help from another person taking care of personal grooming?: None Help from another person toileting, which includes using toliet, bedpan, or urinal?: A Little Help from another person bathing (including washing, rinsing, drying)?: A Little Help from another person to put on and taking off regular upper body clothing?: A Little Help from another person to put on and taking off regular lower body clothing?: A Lot 6 Click Score: 19   End of Session Nurse Communication: Mobility status  Activity Tolerance: Patient tolerated treatment well Patient left: in bed;with call bell/phone within reach;with family/visitor present  OT Visit Diagnosis: Other abnormalities of gait and mobility (R26.89)                 Time: 4098-1191 OT Time Calculation (min): 27 min Charges:  OT General Charges $OT Visit: 1 Visit OT Evaluation $OT Eval Low Complexity: 1 Low OT Treatments $Self Care/Home Management : 8-22 mins  Kathie Dike, M.S. OTR/L  01/27/23, 1:01 PM  ascom [phone]

## 2023-01-27 NOTE — Plan of Care (Signed)

## 2023-01-27 NOTE — Evaluation (Addendum)
Physical Therapy Evaluation Patient Details Name: [name] MRN: [id number] DOB: [birth year] Today's Date: 01/27/2023  History of Present Illness  Pt is a 72 y/o male presenting s/p spinal fusion (per op note L2-3 lateral lumbar interbody fusion, T10-pelvis posterior spinal fusion, T12-L1 decrompression, L1-2 posterior spinal decompression, L5-S1 transforaminal lumbar interbody fusion). Initial findings prior to surgery included pseudoarthrosis, retrolisthesis L2-3, thoracic stenosis, and multilevel lumbar compression. PMH includes multiple back surgeries/fusions, R TKA (10/25/22), L TKA, skin cancer, hepatitis C, GERD, gout, HTN, peripheral neuropathy, sleep apnea, and chronic LBP with sciatica.  Clinical Impression   Pt presents laying in bed with wife in room, 8/10 pain in back/hip region. He currently lives with his wife in a 1 story home with 1 step to enter and no rails. PTA he was modi/ind with ADLs and did not use an AD for mobility.   P LE sensation and strength screening WFL. Pt required CGA and RW for rolling,  and RW and CGA for s/l>sit, sit<>stand, step pivot transfer to recliner, ambulation of ~167ft, and 1 step negotiation. PT noted some unsteadiness with initial standing that resolves quickly. Pt required VC for initial hand placement on RW but was able to complete without additional cueing by the end of session. Pt educated on log roll, BLT precautions, TLSO brace donning/doffing/precautions, safe RW use, and safe stair negotiation during today's session. Pt overall tolerated todays treatment well and would benefit from continued skilled therapy to maximize functional abilities.       If plan is discharge home, recommend the following: A little help with walking and/or transfers;A little help with bathing/dressing/bathroom;Assistance with cooking/housework;Assist for transportation;Help with stairs or ramp for entrance;Direct supervision/assist for medications management   Can  travel by private vehicle        Equipment Recommendations Rolling walker (2 wheels)  Recommendations for Other Services       Functional Status Assessment Patient has had a recent decline in their functional status and demonstrates the ability to make significant improvements in function in a reasonable and predictable amount of time.     Precautions / Restrictions Precautions Precautions: Back Precaution Comments: BLT for comfort, TLSO brace with OOB mobility/ for comfort Required Braces or Orthoses: Spinal Brace Spinal Brace: Thoracolumbosacral orthotic Restrictions Weight Bearing Restrictions: No      Mobility  Bed Mobility Overal bed mobility: Needs Assistance Bed Mobility: Rolling, Sidelying to Sit Rolling: Contact guard assist Sidelying to sit: Contact guard assist            Transfers Overall transfer level: Needs assistance Equipment used: Rolling walker (2 wheels) Transfers: Sit to/from Stand, Bed to chair/wheelchair/BSC Sit to Stand: Contact guard assist   Step pivot transfers: Contact guard assist       General transfer comment: Some unsteadiness with initial standing that resoves quickly, VC needed for handplacement    Ambulation/Gait Ambulation/Gait assistance: Contact guard assist Gait Distance (Feet): 160 Feet Assistive device: Rolling walker (2 wheels) Gait Pattern/deviations: Decreased stride length, Trunk flexed Gait velocity: decreased        Stairs Stairs: Yes Stairs assistance: Contact guard assist Stair Management: No rails, With walker Number of Stairs: 1 General stair comments: Pt able to demonstrate safe stair negotiation following education  (ascend/descend)  Wheelchair Mobility     Tilt Bed    Modified Rankin (Stroke Patients Only)       Balance Overall balance assessment: Needs assistance Sitting-balance support: No upper extremity supported, Feet supported Sitting balance-Leahy Scale: Fair  Standing  balance support: Bilateral upper extremity supported, During functional activity Standing balance-Leahy Scale: Fair                               Pertinent Vitals/Pain Pain Assessment Pain Assessment: 0-10 Pain Score: 8  Pain Location: back/ hip Pain Descriptors / Indicators: Discomfort, Constant Pain Intervention(s): Monitored during session, Limited activity within patient's tolerance, Premedicated before session    Home Living Family/patient expects to be discharged to:: Private residence Living Arrangements: Spouse/significant other Available Help at Discharge: Family;Available 24 hours/day Type of Home: House Home Access: Stairs to enter Entrance Stairs-Rails: None Entrance Stairs-Number of Steps: 1   Home Layout: One level Home Equipment: Cane - single point;Rollator (4 wheels)      Prior Function Prior Level of Function : Independent/Modified Independent             Mobility Comments: Did not use AD for mobility       Extremity/Trunk Assessment   Upper Extremity Assessment Upper Extremity Assessment: Overall WFL for tasks assessed    Lower Extremity Assessment Lower Extremity Assessment: Overall WFL for tasks assessed;Generalized weakness (LE sensation WFL, able to move LE against gravity)       Communication   Communication Communication: No apparent difficulties  Cognition Arousal: Alert Behavior During Therapy: WFL for tasks assessed/performed Overall Cognitive Status: Within Functional Limits for tasks assessed                                 General Comments: A&Ox4        General Comments General comments (skin integrity, edema, etc.): Pt on 2L O2, SPO2 remained ~90-94 throughout session    Exercises     Assessment/Plan    PT Assessment Patient needs continued PT services  PT Problem List Decreased strength;Decreased range of motion;Decreased activity tolerance;Decreased balance;Decreased mobility;Decreased  coordination;Decreased knowledge of use of DME;Decreased safety awareness;Decreased knowledge of precautions       PT Treatment Interventions DME instruction;Balance training;Gait training;Stair training;Functional mobility training;Therapeutic activities;Therapeutic exercise;Patient/family education;Neuromuscular re-education    PT Goals (Current goals can be found in the Care Plan section)  Acute Rehab PT Goals Patient Stated Goal: return home PT Goal Formulation: With patient Time For Goal Achievement: 02/10/23 Potential to Achieve Goals: Good    Frequency 7X/week     Co-evaluation               AM-PAC PT "6 Clicks" Mobility  Outcome Measure Help needed turning from your back to your side while in a flat bed without using bedrails?: A Little Help needed moving from lying on your back to sitting on the side of a flat bed without using bedrails?: A Little Help needed moving to and from a bed to a chair (including a wheelchair)?: A Little Help needed standing up from a chair using your arms (e.g., wheelchair or bedside chair)?: A Little Help needed to walk in hospital room?: A Little Help needed climbing 3-5 steps with a railing? : A Little 6 Click Score: 18    End of Session Equipment Utilized During Treatment: Back brace;Oxygen Activity Tolerance: Patient tolerated treatment well Patient left: in chair;with call bell/phone within reach;with family/visitor present   PT Visit Diagnosis: Other abnormalities of gait and mobility (R26.89);Muscle weakness (generalized) (M62.81);Difficulty in walking, not elsewhere classified (R26.2)    Time: 8295-6213 PT Time Calculation (min) (ACUTE ONLY):  63 min   Charges:   PT Evaluation $PT Eval Low Complexity: 1 Low PT Treatments $Gait Training: 23-37 mins $Therapeutic Activity: 23-37 mins PT General Charges $$ ACUTE PT VISIT: 1 Visit         Ronnica Dreese, PT, SPT 12:09 PM,01/27/23

## 2023-01-27 NOTE — Plan of Care (Signed)
  Problem: Clinical Measurements: Goal: Ability to maintain clinical measurements within normal limits will improve Outcome: Progressing Goal: Will remain free from infection Outcome: Progressing Goal: Diagnostic test results will improve Outcome: Progressing   Problem: Activity: Goal: Risk for activity intolerance will decrease Outcome: Progressing   Problem: Coping: Goal: Level of anxiety will decrease Outcome: Progressing   

## 2023-01-27 NOTE — Progress Notes (Signed)
   Neurosurgery Progress Note  History: [name] is s/p L2-3 XLIF, L3-4 TLIF, T12 and L1 and L1-2 decompression, T10 - pelvis PSF  POD1: Pt reporting significant back pain overnight.  Physical Exam: Vitals:   01/27/23 0300 01/27/23 0400  BP: (!) 124/110 121/80  Pulse: 96 92  Resp: 16 (!) 21  Temp:    SpO2: 94% 95%    AA Ox3 CNI  Strength:5/5 throughout BLE  HV output 190 and 210  Data:  Other tests/results:     Latest Ref Rng & Units 01/27/2023    4:57 AM 01/14/2023    2:14 PM 11/11/2022   12:31 PM  CBC  WBC 4.0 - 10.5 K/uL 11.9  4.5    Hemoglobin 13.0 - 17.0 g/dL 65.7  84.6  96.2   Hematocrit 39.0 - 52.0 % 41.0  52.4  48.4   Platelets 150 - 400 K/uL 166  176        Latest Ref Rng & Units 01/27/2023    4:57 AM 01/14/2023    2:14 PM 10/17/2022   12:29 PM  BMP  Glucose 70 - 99 mg/dL 952  99  94   BUN 8 - 23 mg/dL 16  15  15    Creatinine 0.61 - 1.24 mg/dL 8.41  3.24  4.01   Sodium 135 - 145 mmol/L 138  137  137   Potassium 3.5 - 5.1 mmol/L 4.2  3.6  3.9   Chloride 98 - 111 mmol/L 109  101  100   CO2 22 - 32 mmol/L 21  27  27    Calcium 8.9 - 10.3 mg/dL 7.9  9.3  9.4      Assessment/Plan:  [name] is a 72 year old presenting with pseudoarthrosis, spondylolisthesis, and chronic back and bilateral leg pain status post L2-3 XLIF, L3-4 TLIF, T12 and L1 and L1-2 decompression, T10 - pelvis PSF  - mobilize - pain control - DVT prophylaxis - PTOT - will continue drains and Prevena -Will transfer from stepdown to floor bed.  Manning Charity PA-C Department of Neurosurgery

## 2023-01-27 NOTE — TOC CM/SW Note (Signed)
Cm received consult for SNF/DME/HH. Cm awaiting PT/OT evaluations for recommendations. Cm continue to follow for needs.

## 2023-01-28 LAB — CBC
HCT: 33.7 % — ABNORMAL LOW (ref 39.0–52.0)
Hemoglobin: 10.9 g/dL — ABNORMAL LOW (ref 13.0–17.0)
MCH: 30.2 pg (ref 26.0–34.0)
MCHC: 32.3 g/dL (ref 30.0–36.0)
MCV: 93.4 fL (ref 80.0–100.0)
Platelets: 117 10*3/uL — ABNORMAL LOW (ref 150–400)
RBC: 3.61 MIL/uL — ABNORMAL LOW (ref 4.22–5.81)
RDW: 13.6 % (ref 11.5–15.5)
WBC: 7.5 10*3/uL (ref 4.0–10.5)
nRBC: 0 % (ref 0.0–0.2)

## 2023-01-28 LAB — BASIC METABOLIC PANEL
Anion gap: 5 (ref 5–15)
BUN: 16 mg/dL (ref 8–23)
CO2: 25 mmol/L (ref 22–32)
Calcium: 7.7 mg/dL — ABNORMAL LOW (ref 8.9–10.3)
Chloride: 106 mmol/L (ref 98–111)
Creatinine, Ser: 0.83 mg/dL (ref 0.61–1.24)
GFR, Estimated: >60 mL/min (ref >60)
Glucose, Bld: 125 mg/dL — ABNORMAL HIGH (ref 70–99)
Potassium: 3.9 mmol/L (ref 3.5–5.1)
Sodium: 136 mmol/L (ref 135–145)

## 2023-01-28 MED ORDER — DIAZEPAM 5 MG PO TABS
5.0000 mg | ORAL_TABLET | Freq: Once | ORAL | Status: AC
  Administered 2023-01-28: 5 mg via ORAL
  Filled 2023-01-28: qty 1

## 2023-01-28 NOTE — Discharge Instructions (Signed)
  Your surgeon has performed an operation on your lumbar spine (low back) to relieve pressure on one or more nerves. Many times, patients feel better immediately after surgery and can "overdo it." Even if you feel well, it is important that you follow these activity guidelines. If you do not let your back heal properly from the surgery, you can increase the chance of hardware complications and/or return of your symptoms. The following are instructions to help in your recovery once you have been discharged from the hospital.  Do not use NSAIDs for 3 months after surgery.   Activity    No bending, lifting, or twisting ("BLT"). Avoid lifting objects heavier than 10 pounds (gallon milk jug).  Where possible, avoid household activities that involve lifting, bending, pushing, or pulling such as laundry, vacuuming, grocery shopping, and childcare. Try to arrange for help from friends and family for these activities while your back heals.  Increase physical activity slowly as tolerated.  Taking short walks is encouraged, but avoid strenuous exercise. Do not jog, run, bicycle, lift weights, or participate in any other exercises unless specifically allowed by your doctor. Avoid prolonged sitting, including car rides.  Talk to your doctor before resuming sexual activity.  You should not drive until cleared by your doctor.  Until released by your doctor, you should not return to work or school.  You should rest at home and let your body heal.   You may shower three days after your surgery.  After showering, lightly dab your incision dry. Do not take a tub bath or go swimming for 3 weeks, or until approved by your doctor at your follow-up appointment.  If you smoke, we strongly recommend that you quit.  Smoking has been proven to interfere with normal healing in your back and will dramatically reduce the success rate of your surgery. Please contact [facility] (800-QUIT-NOW) and use the resources at  www.[facility].com for assistance in stopping smoking.  Surgical Incision   If you have a dressing on your incision, you may remove it three days after your surgery. Keep your incision area clean and dry.   Diet            You may return to your usual diet. Be sure to stay hydrated.  When to Contact us  Although your surgery and recovery will likely be uneventful, you may have some residual numbness, aches, and pains in your back and/or legs. This is normal and should improve in the next few weeks.  However, should you experience any of the following, contact us immediately: New numbness or weakness Pain that is progressively getting worse, and is not relieved by your pain medications or rest Bleeding, redness, swelling, pain, or drainage from surgical incision Chills or flu-like symptoms Fever greater than 101.0 F (38.3 C) Problems with bowel or bladder functions Difficulty breathing or shortness of breath Warmth, tenderness, or swelling in your calf  Contact Information During office hours (Monday-Friday 9 am to 5 pm), please call your physician at [phone] and ask for Sharlot Gowda After hours and weekends, please call [phone] and speak with the neurosurgeon on call For a life-threatening emergency, call 911

## 2023-01-28 NOTE — Progress Notes (Addendum)
   Neurosurgery Progress Note  History: [name] is s/p L2-3 XLIF, L3-4 TLIF, T12 and L1 and L1-2 decompression, T10 - pelvis PSF  POD2: Some increased pain this morning after working with PT yesterday POD1: Pt reporting significant back pain overnight.  Physical Exam: Vitals:   01/28/23 0058 01/28/23 0544  BP: 109/70 112/71  Pulse: 76 69  Resp: (!) 22 (!) 22  Temp: 99.1 F (37.3 C) 98 F (36.7 C)  SpO2: 96% 94%    AA Ox3 CNI  Strength:5/5 throughout BLE  HV output right 235. Left 80  Data:  Other tests/results:     Latest Ref Rng & Units 01/28/2023    3:28 AM 01/27/2023    4:57 AM 01/14/2023    2:14 PM  CBC  WBC 4.0 - 10.5 K/uL 7.5  11.9  4.5   Hemoglobin 13.0 - 17.0 g/dL 32.3  55.7  32.2   Hematocrit 39.0 - 52.0 % 33.7  41.0  52.4   Platelets 150 - 400 K/uL 117  166  176       Latest Ref Rng & Units 01/28/2023    3:28 AM 01/27/2023    4:57 AM 01/14/2023    2:14 PM  BMP  Glucose 70 - 99 mg/dL 025  427  99   BUN 8 - 23 mg/dL 16  16  15    Creatinine 0.61 - 1.24 mg/dL 0.62  3.76  2.83   Sodium 135 - 145 mmol/L 136  138  137   Potassium 3.5 - 5.1 mmol/L 3.9  4.2  3.6   Chloride 98 - 111 mmol/L 106  109  101   CO2 22 - 32 mmol/L 25  21  27    Calcium 8.9 - 10.3 mg/dL 7.7  7.9  9.3      Assessment/Plan:  [name] is a 72 year old presenting with pseudoarthrosis, spondylolisthesis, and chronic back and bilateral leg pain status post L2-3 XLIF, L3-4 TLIF, T12 and L1 and L1-2 decompression, T10 - pelvis PSF  - mobilize - pain control; gave a one time dose of Valium for increased muscular pain - DVT prophylaxis - PTOT - Will plan to remove left drain today and continue to monitor right HV and Prevena  Manning Charity PA-C Department of Neurosurgery

## 2023-01-28 NOTE — Plan of Care (Signed)
  Problem: Skin Integrity: Goal: Risk for impaired skin integrity will decrease Outcome: Progressing   Problem: Education: Goal: Ability to verbalize activity precautions or restrictions will improve Outcome: Progressing   Problem: Education: Goal: Knowledge of the prescribed therapeutic regimen will improve Outcome: Progressing   Problem: Activity: Goal: Ability to avoid complications of mobility impairment will improve Outcome: Progressing   Problem: Safety: Goal: Ability to remain free from injury will improve Outcome: Progressing   Problem: Pain Management: Goal: Pain level will decrease Outcome: Progressing   Problem: Bladder/Genitourinary: Goal: Urinary functional status for postoperative course will improve Outcome: Progressing   Problem: Health Behavior/Discharge Planning: Goal: Identification of resources available to assist in meeting health care needs will improve Outcome: Progressing

## 2023-01-28 NOTE — Progress Notes (Addendum)
Physical Therapy Treatment Patient Details Name: [name] MRN: [id number] DOB: Nov 06, 1950 Today's Date: 01/28/2023   History of Present Illness Pt is a 72 y/o male presenting s/p spinal fusion (per op note L2-3 lateral lumbar interbody fusion, T10-pelvis posterior spinal fusion, T12-L1 decrompression, L1-2 posterior spinal decompression, L5-S1 transforaminal lumbar interbody fusion). Initial findings prior to surgery included pseudoarthrosis, retrolisthesis L2-3, thoracic stenosis, and multilevel lumbar compression. PMH includes multiple back surgeries/fusions, R TKA (10/25/22), L TKA, skin cancer, hepatitis C, GERD, gout, HTN, peripheral neuropathy, sleep apnea, and chronic LBP with sciatica.    PT Comments  Pt presents laying in bed with wife in room, pain 8/10 in back/ R hip. Pt able to perform rolling/sidelying>sit with CGA, requiring increased time and effort to reach sitting due to pain. Pt tolerated sit<>stand and ambulating ~283ft with RW and CGA, noting walking improved pain levels. PT noting heavy UE reliance on RW due to pain and decreased activity tolerance. He would benefit from continued skilled therapy to maximize functional abilities.    If plan is discharge home, recommend the following: A little help with walking and/or transfers;A little help with bathing/dressing/bathroom;Assistance with cooking/housework;Assist for transportation;Help with stairs or ramp for entrance;Direct supervision/assist for medications management   Can travel by private vehicle        Equipment Recommendations  Rolling walker (2 wheels)    Recommendations for Other Services       Precautions / Restrictions Precautions Precautions: Back Precaution Comments: BLT for comfort, TLSO brace with OOB mobility/ for comfort Required Braces or Orthoses: Spinal Brace Spinal Brace: Thoracolumbosacral orthotic Restrictions Weight Bearing Restrictions: No     Mobility  Bed Mobility Overal bed  mobility: Needs Assistance Bed Mobility: Rolling, Sidelying to Sit Rolling: Contact guard assist Sidelying to sit: Contact guard assist       General bed mobility comments: increased time and effort to achieve sitting    Transfers Overall transfer level: Needs assistance Equipment used: Rolling walker (2 wheels) Transfers: Sit to/from Stand Sit to Stand: Supervision           General transfer comment: VC for hand placement    Ambulation/Gait Ambulation/Gait assistance: Contact guard assist Gait Distance (Feet): 200 Feet Assistive device: Rolling walker (2 wheels) Gait Pattern/deviations: Shuffle, Trunk flexed, Step-through pattern Gait velocity: decreased     General Gait Details: Pt noted pain improved with walking   Stairs             Wheelchair Mobility     Tilt Bed    Modified Rankin (Stroke Patients Only)       Balance Overall balance assessment: Needs assistance Sitting-balance support: No upper extremity supported, Feet supported Sitting balance-Leahy Scale: Good     Standing balance support: Bilateral upper extremity supported, During functional activity, Reliant on assistive device for balance Standing balance-Leahy Scale: Fair                              Cognition Arousal: Alert Behavior During Therapy: WFL for tasks assessed/performed Overall Cognitive Status: Within Functional Limits for tasks assessed                                          Exercises General Exercises - Lower Extremity Ankle Circles/Pumps: AROM, 20 reps, Supine Quad Sets: AROM, Both, 10 reps, Supine Heel Slides: AROM, Both, 10  reps, Supine    General Comments General comments (skin integrity, edema, etc.): Pt on 2 L O2 throughout session, SPO2 remained >92%      Pertinent Vitals/Pain Pain Assessment Pain Assessment: 0-10 Pain Score: 8  Pain Location: back/ R hip Pain Descriptors / Indicators: Discomfort, Constant Pain  Intervention(s): Limited activity within patient's tolerance, Monitored during session    Home Living                          Prior Function            PT Goals (current goals can now be found in the care plan section) Acute Rehab PT Goals Patient Stated Goal: return home Progress towards PT goals: Progressing toward goals    Frequency    7X/week      PT Plan      Co-evaluation              AM-PAC PT "6 Clicks" Mobility   Outcome Measure  Help needed turning from your back to your side while in a flat bed without using bedrails?: A Little Help needed moving from lying on your back to sitting on the side of a flat bed without using bedrails?: A Little Help needed moving to and from a bed to a chair (including a wheelchair)?: A Little Help needed standing up from a chair using your arms (e.g., wheelchair or bedside chair)?: A Little Help needed to walk in hospital room?: A Little Help needed climbing 3-5 steps with a railing? : A Little 6 Click Score: 18    End of Session Equipment Utilized During Treatment: Back brace;Oxygen Activity Tolerance: Patient tolerated treatment well Patient left: in chair;with call bell/phone within reach;with family/visitor present;with nursing/sitter in room Nurse Communication: Mobility status PT Visit Diagnosis: Other abnormalities of gait and mobility (R26.89);Muscle weakness (generalized) (M62.81);Difficulty in walking, not elsewhere classified (R26.2)     Time: 5409-8119 PT Time Calculation (min) (ACUTE ONLY): 41 min  Charges:    $Therapeutic Exercise: 8-22 mins $Therapeutic Activity: 23-37 mins PT General Charges $$ ACUTE PT VISIT: 1 Visit                    Stormey Wilborn, PT, SPT 12:05 PM,01/28/23

## 2023-01-28 NOTE — Plan of Care (Signed)
  Problem: Activity: Goal: Risk for activity intolerance will decrease Outcome: Progressing   Problem: Pain Managment: Goal: General experience of comfort will improve Outcome: Progressing   Problem: Safety: Goal: Ability to remain free from injury will improve Outcome: Progressing   Problem: Skin Integrity: Goal: Will show signs of wound healing Outcome: Progressing

## 2023-01-28 NOTE — TOC Progression Note (Addendum)
Transition of Care [facility]) - Progression Note    Patient Details  Name: [name] MRN: [id number] Date of Birth: May 17, 1951  [facility] CM/SW Contact  Marlowe Sax, RN Phone Number: 01/28/2023, 9:53 AM  Clinical Narrative:    Spoke with the patient and his wife  He has a rolling walker at home with 2 front wheels, they have used [facility] in the past for [facility] and would like to use them again,  I sent the referral to [place] at [facility] is not able to accept the patient, [facility] was given the referral [facility] the [facility]   Expected Discharge Plan: Home w Home Health Services Barriers to Discharge: No Barriers Identified  Expected Discharge Plan and Services   Discharge Planning Services: CM Consult   Living arrangements for the past 2 months: Single Family Home                 DME Arranged: N/A DME Agency: NA       HH Arranged: PT, OT HH Agency: [facility] Date HH Agency Contacted: 01/28/23 Time HH Agency Contacted: [phone] Representative spoke with at [facility] Agency: [place]   Social Determinants of Health (SDOH) Interventions SDOH Screenings   Food Insecurity: No Food Insecurity (10/24/2022)  Housing: Low Risk  (10/24/2022)  Transportation Needs: No Transportation Needs (10/24/2022)  Utilities: Not At Risk (10/24/2022)  Tobacco Use: Low Risk  (01/26/2023)  Recent Concern: Tobacco Use - Medium Risk (12/14/2022)   Received from [facility], [facility]    Readmission Risk Interventions     No data to display

## 2023-01-29 LAB — CBC
HCT: 31.8 % — ABNORMAL LOW (ref 39.0–52.0)
Hemoglobin: 10.3 g/dL — ABNORMAL LOW (ref 13.0–17.0)
MCH: 30.3 pg (ref 26.0–34.0)
MCHC: 32.4 g/dL (ref 30.0–36.0)
MCV: 93.5 fL (ref 80.0–100.0)
Platelets: 124 10*3/uL — ABNORMAL LOW (ref 150–400)
RBC: 3.4 MIL/uL — ABNORMAL LOW (ref 4.22–5.81)
RDW: 13.5 % (ref 11.5–15.5)
WBC: 6.4 10*3/uL (ref 4.0–10.5)
nRBC: 0 % (ref 0.0–0.2)

## 2023-01-29 LAB — BASIC METABOLIC PANEL
Anion gap: 2 — ABNORMAL LOW (ref 5–15)
BUN: 11 mg/dL (ref 8–23)
CO2: 28 mmol/L (ref 22–32)
Calcium: 7.9 mg/dL — ABNORMAL LOW (ref 8.9–10.3)
Chloride: 105 mmol/L (ref 98–111)
Creatinine, Ser: 0.72 mg/dL (ref 0.61–1.24)
GFR, Estimated: >60 mL/min (ref >60)
Glucose, Bld: 125 mg/dL — ABNORMAL HIGH (ref 70–99)
Potassium: 3.6 mmol/L (ref 3.5–5.1)
Sodium: 135 mmol/L (ref 135–145)

## 2023-01-29 MED ORDER — DIAZEPAM 5 MG PO TABS
5.0000 mg | ORAL_TABLET | Freq: Three times a day (TID) | ORAL | Status: DC | PRN
  Administered 2023-01-29: 5 mg via ORAL
  Filled 2023-01-29: qty 1

## 2023-01-29 MED ORDER — CELECOXIB 200 MG PO CAPS
200.0000 mg | ORAL_CAPSULE | Freq: Two times a day (BID) | ORAL | Status: DC
  Administered 2023-01-30: 200 mg via ORAL
  Filled 2023-01-29 (×2): qty 1

## 2023-01-29 MED ORDER — CELECOXIB 200 MG PO CAPS
200.0000 mg | ORAL_CAPSULE | ORAL | Status: AC
  Administered 2023-01-29: 200 mg via ORAL
  Filled 2023-01-29: qty 1

## 2023-01-29 NOTE — Progress Notes (Signed)
   Neurosurgery Progress Note  History: [name] is s/p L2-3 XLIF, L3-4 TLIF, T12 and L1 and L1-2 decompression, T10 - pelvis PSF  POD3: continued back pain but has improved since yesterday POD2: Some increased pain this morning after working with PT yesterday POD1: Pt reporting significant back pain overnight.  Physical Exam: Vitals:   01/29/23 0438 01/29/23 0732  BP: 107/69 121/79  Pulse: 61 84  Resp: 18   Temp: 98.5 F (36.9 C)   SpO2: 94% 94%    AA Ox3 CNI  Strength:5/5 throughout BLE  HV output right 130  Data:  Other tests/results:     Latest Ref Rng & Units 01/29/2023    2:31 AM 01/28/2023    3:28 AM 01/27/2023    4:57 AM  CBC  WBC 4.0 - 10.5 K/uL 6.4  7.5  11.9   Hemoglobin 13.0 - 17.0 g/dL 08.6  57.8  46.9   Hematocrit 39.0 - 52.0 % 31.8  33.7  41.0   Platelets 150 - 400 K/uL 124  117  166       Latest Ref Rng & Units 01/29/2023    2:31 AM 01/28/2023    3:28 AM 01/27/2023    4:57 AM  BMP  Glucose 70 - 99 mg/dL 629  528  413   BUN 8 - 23 mg/dL 11  16  16    Creatinine 0.61 - 1.24 mg/dL 2.44  0.10  2.72   Sodium 135 - 145 mmol/L 135  136  138   Potassium 3.5 - 5.1 mmol/L 3.6  3.9  4.2   Chloride 98 - 111 mmol/L 105  106  109   CO2 22 - 32 mmol/L 28  25  21    Calcium 8.9 - 10.3 mg/dL 7.9  7.7  7.9      Assessment/Plan:  [name] is a 72 year old presenting with pseudoarthrosis, spondylolisthesis, and chronic back and bilateral leg pain status post L2-3 XLIF, L3-4 TLIF, T12 and L1 and L1-2 decompression, T10 - pelvis PSF  - mobilize - pain control; will add low dose Valium PRN for increased muscular pain - DVT prophylaxis - PTOT - left drain removed 8/21. continue to monitor right HV and Prevena -Dispo planning underway.  Plans for home health with [facility]  Manning Charity PA-C Department of Neurosurgery

## 2023-01-29 NOTE — Care Management Important Message (Signed)
Important Message  Patient Details  Name: [name] MRN: [id number] Date of Birth: 11/08/1950   Medicare Important Message Given:  N/A - LOS <3 / Initial given by admissions     [name] 01/29/2023, 9:41 AM

## 2023-01-29 NOTE — Progress Notes (Addendum)
Physical Therapy Treatment Patient Details Name: [name] MRN: [id number] DOB: 02-18-1951 Today's Date: 01/29/2023   History of Present Illness Pt is a 72 y/o male presenting s/p spinal fusion (per op note L2-3 lateral lumbar interbody fusion, T10-pelvis posterior spinal fusion, T12-L1 decrompression, L1-2 posterior spinal decompression, L5-S1 transforaminal lumbar interbody fusion). Initial findings prior to surgery included pseudoarthrosis, retrolisthesis L2-3, thoracic stenosis, and multilevel lumbar compression. PMH includes multiple back surgeries/fusions, R TKA (10/25/22), L TKA, skin cancer, hepatitis C, GERD, gout, HTN, peripheral neuropathy, sleep apnea, and chronic LBP with sciatica.    PT Comments  Pt presents with wife in room, 7/10 pain in back/hip region. Pt shows improvements in log rolling, requiring supervision for rolling to L and CGA for transition between S/L>sit. Pt performed sit<>stand and ambulated to [employer] with RW/CGA for voiding (able to perform hygiene independently). PT educated wife on donning back brace for ambulation and demonstrated understanding. Pt able to ambulate ~256ft in hallway with RW and CGA, pt noting slight improvement in stability but feels more LE fatigue today compared to previous visits. Pt would benefit from continued skilled therapy to maximize functional abilities.    If plan is discharge home, recommend the following: A little help with walking and/or transfers;A little help with bathing/dressing/bathroom;Assistance with cooking/housework;Assist for transportation;Help with stairs or ramp for entrance;Direct supervision/assist for medications management   Can travel by private vehicle        Equipment Recommendations  Rolling walker (2 wheels)    Recommendations for Other Services       Precautions / Restrictions Precautions Precautions: Back Precaution Comments: BLT for comfort, TLSO brace with OOB mobility/ for comfort Required  Braces or Orthoses: Spinal Brace Spinal Brace: Thoracolumbosacral orthotic Restrictions Weight Bearing Restrictions: No     Mobility  Bed Mobility Overal bed mobility: Needs Assistance Bed Mobility: Rolling, Sidelying to Sit Rolling: Supervision Sidelying to sit: Contact guard assist       General bed mobility comments: increased time and effort to achieve sitting, CGA for transition from rolling to sidelying> sit    Transfers Overall transfer level: Needs assistance Equipment used: Rolling walker (2 wheels) Transfers: Sit to/from Stand Sit to Stand: Contact guard assist, From elevated surface           General transfer comment: Pt able to initiate proper hand placement on RW without cueing    Ambulation/Gait   Gait Distance (Feet): 210 Feet   Gait Pattern/deviations: Shuffle, Trunk flexed, Step-through pattern       General Gait Details: improved pain with walking, legs felt tired by the end of ambulation   Stairs             Wheelchair Mobility     Tilt Bed    Modified Rankin (Stroke Patients Only)       Balance Overall balance assessment: Needs assistance Sitting-balance support: No upper extremity supported, Feet supported Sitting balance-Leahy Scale: Good     Standing balance support: Bilateral upper extremity supported, During functional activity, Reliant on assistive device for balance Standing balance-Leahy Scale: Fair                              Cognition Arousal: Alert Behavior During Therapy: WFL for tasks assessed/performed Overall Cognitive Status: Within Functional Limits for tasks assessed  Exercises      General Comments General comments (skin integrity, edema, etc.): Pt on room air      Pertinent Vitals/Pain Pain Assessment Pain Assessment: 0-10 Pain Score: 7  Pain Location: back/ R hip Pain Descriptors / Indicators: Discomfort, Constant     Home Living                          Prior Function            PT Goals (current goals can now be found in the care plan section) Acute Rehab PT Goals Patient Stated Goal: return home Progress towards PT goals: Progressing toward goals    Frequency    7X/week      PT Plan      Co-evaluation              AM-PAC PT "6 Clicks" Mobility   Outcome Measure  Help needed turning from your back to your side while in a flat bed without using bedrails?: A Little Help needed moving from lying on your back to sitting on the side of a flat bed without using bedrails?: A Little Help needed moving to and from a bed to a chair (including a wheelchair)?: A Little Help needed standing up from a chair using your arms (e.g., wheelchair or bedside chair)?: A Little Help needed to walk in hospital room?: A Little Help needed climbing 3-5 steps with a railing? : A Little 6 Click Score: 18    End of Session Equipment Utilized During Treatment: Back brace Activity Tolerance: Patient tolerated treatment well Patient left: in chair;with call bell/phone within reach;with family/visitor present   PT Visit Diagnosis: Other abnormalities of gait and mobility (R26.89);Muscle weakness (generalized) (M62.81);Difficulty in walking, not elsewhere classified (R26.2)     Time: 1030-1100 PT Time Calculation (min) (ACUTE ONLY): 30 min  Charges:    $Therapeutic Activity: 23-37 mins PT General Charges $$ ACUTE PT VISIT: 1 Visit                    Jrake Rodriquez, PT, SPT 11:23 AM,01/29/23

## 2023-01-30 MED ORDER — SORBITOL 70 % SOLN
30.0000 mL | Freq: Once | Status: AC
  Administered 2023-01-30: 30 mL via ORAL
  Filled 2023-01-30: qty 30

## 2023-01-30 MED ORDER — METHOCARBAMOL 500 MG PO TABS: 500.0000 mg | ORAL_TABLET | Freq: Four times a day (QID) | ORAL | 0 refills | Status: DC

## 2023-01-30 MED ORDER — DIAZEPAM 5 MG PO TABS: 5.0000 mg | ORAL_TABLET | Freq: Three times a day (TID) | ORAL | 0 refills | Status: AC | PRN

## 2023-01-30 MED ORDER — SENNA 8.6 MG PO TABS: 1.0000 | ORAL_TABLET | Freq: Every day | ORAL | 0 refills | Status: DC | PRN

## 2023-01-30 MED ORDER — OXYCODONE HCL 5 MG PO TABS: 5.0000 mg | ORAL_TABLET | ORAL | 0 refills | Status: DC | PRN

## 2023-01-30 NOTE — Care Management Important Message (Signed)
Important Message  Patient Details  Name: [name] MRN: [id number] Date of Birth: 04/02/1951   Medicare Important Message Given:  Yes     Olegario Messier A Emmersyn Kratzke 01/30/2023, 11:25 AM

## 2023-01-30 NOTE — Plan of Care (Signed)
  Problem: Activity: Goal: Risk for activity intolerance will decrease Outcome: Progressing   Problem: Elimination: Goal: Will not experience complications related to bowel motility Outcome: Progressing   Problem: Pain Managment: Goal: General experience of comfort will improve Outcome: Progressing   Problem: Safety: Goal: Ability to remain free from injury will improve Outcome: Progressing   Problem: Skin Integrity: Goal: Risk for impaired skin integrity will decrease Outcome: Progressing   Problem: Bowel/Gastric: Goal: Gastrointestinal status for postoperative course will improve Outcome: Progressing

## 2023-01-30 NOTE — Progress Notes (Signed)
   Neurosurgery Progress Note  History: [name] is s/p L2-3 XLIF, L3-4 TLIF, T12 and L1 and L1-2 decompression, T10 - pelvis PSF  POD4: Pt reports improved pain this morning POD3: continued back pain but has improved since yesterday POD2: Some increased pain this morning after working with PT yesterday POD1: Pt reporting significant back pain overnight.  Physical Exam: Vitals:   01/30/23 0030 01/30/23 0430  BP: 102/72 130/82  Pulse: (!) 59 71  Resp: 17 17  Temp: 97.9 F (36.6 C) 98.2 F (36.8 C)  SpO2: 93% 95%    AA Ox3 CNI  Strength:5/5 throughout BLE  HV output right 90 over 24 hours.   Data:  Other tests/results:     Latest Ref Rng & Units 01/29/2023    2:31 AM 01/28/2023    3:28 AM 01/27/2023    4:57 AM  CBC  WBC 4.0 - 10.5 K/uL 6.4  7.5  11.9   Hemoglobin 13.0 - 17.0 g/dL 28.4  13.2  44.0   Hematocrit 39.0 - 52.0 % 31.8  33.7  41.0   Platelets 150 - 400 K/uL 124  117  166       Latest Ref Rng & Units 01/29/2023    2:31 AM 01/28/2023    3:28 AM 01/27/2023    4:57 AM  BMP  Glucose 70 - 99 mg/dL 102  725  366   BUN 8 - 23 mg/dL 11  16  16    Creatinine 0.61 - 1.24 mg/dL 4.40  3.47  4.25   Sodium 135 - 145 mmol/L 135  136  138   Potassium 3.5 - 5.1 mmol/L 3.6  3.9  4.2   Chloride 98 - 111 mmol/L 105  106  109   CO2 22 - 32 mmol/L 28  25  21    Calcium 8.9 - 10.3 mg/dL 7.9  7.7  7.9      Assessment/Plan:  [name] is a 72 year old presenting with pseudoarthrosis, spondylolisthesis, and chronic back and bilateral leg pain status post L2-3 XLIF, L3-4 TLIF, T12 and L1 and L1-2 decompression, T10 - pelvis PSF  - mobilize - pain control; will add low dose Valium PRN for increased muscular pain - DVT prophylaxis - PTOT - left drain removed 8/21. Will remove right drain and wound vac this morning  -Dispo planning underway.  Plans for home health with [facility]  Manning Charity PA-C Department of Neurosurgery

## 2023-01-30 NOTE — Progress Notes (Addendum)
Physical Therapy Treatment Patient Details Name: [name] MRN: [id number] DOB: Apr 17, 1951 Today's Date: 01/30/2023   History of Present Illness Pt is a 72 y/o male presenting s/p spinal fusion (per op note L2-3 lateral lumbar interbody fusion, T10-pelvis posterior spinal fusion, T12-L1 decrompression, L1-2 posterior spinal decompression, L5-S1 transforaminal lumbar interbody fusion). Initial findings prior to surgery included pseudoarthrosis, retrolisthesis L2-3, thoracic stenosis, and multilevel lumbar compression. PMH includes multiple back surgeries/fusions, R TKA (10/25/22), L TKA, skin cancer, hepatitis C, GERD, gout, HTN, peripheral neuropathy, sleep apnea, and chronic LBP with sciatica.    PT Comments  Pt presents laying in bed with wife in room, 8/10 pain in R hip/back regions. Pt performed rolling modi and sidelying>sit with HOB elevated and supervision. Pt able to complete sit<>stand with CGA/supervision from regular height surface and proper hand placement on RW. He ambulated ~438ft with CGA/supervision, noting improved upright posture and stability. PT educated on regular movement throughout the day for pain management and to maintain functional gains as well as strategies for toileting/hygeine. PT noted improved efficiency and activity tolerance throughout session. He would benefit from continued skilled therapy to maximize functional abilities.    If plan is discharge home, recommend the following: A little help with walking and/or transfers;A little help with bathing/dressing/bathroom;Assistance with cooking/housework;Assist for transportation;Help with stairs or ramp for entrance;Direct supervision/assist for medications management   Can travel by private vehicle        Equipment Recommendations  Rolling walker (2 wheels)    Recommendations for Other Services       Precautions / Restrictions Precautions Precautions: Back Precaution Comments: BLT for comfort, TLSO  brace with OOB mobility/ for comfort Required Braces or Orthoses: Spinal Brace Spinal Brace: Thoracolumbosacral orthotic Restrictions Weight Bearing Restrictions: No     Mobility  Bed Mobility Overal bed mobility: Needs Assistance Bed Mobility: Rolling, Sidelying to Sit Rolling: Modified independent (Device/Increase time) Sidelying to sit: Supervision       General bed mobility comments: continued need for increased time and effort to achieve sitting    Transfers Overall transfer level: Needs assistance Equipment used: Rolling walker (2 wheels) Transfers: Sit to/from Stand Sit to Stand: Contact guard assist           General transfer comment: Pt able to initiate proper hand placement on RW without cueing    Ambulation/Gait Ambulation/Gait assistance: Supervision, Contact guard assist Gait Distance (Feet): 400 Feet Assistive device: Rolling walker (2 wheels) Gait Pattern/deviations: Step-through pattern Gait velocity: decreased     General Gait Details: improved upright postering and steadiness with mobility   Stairs             Wheelchair Mobility     Tilt Bed    Modified Rankin (Stroke Patients Only)       Balance Overall balance assessment: Needs assistance Sitting-balance support: No upper extremity supported, Feet supported Sitting balance-Leahy Scale: Good     Standing balance support: Bilateral upper extremity supported, During functional activity Standing balance-Leahy Scale: Fair                              Cognition Arousal: Alert Behavior During Therapy: WFL for tasks assessed/performed Overall Cognitive Status: Within Functional Limits for tasks assessed  Exercises      General Comments        Pertinent Vitals/Pain Pain Assessment Pain Assessment: 0-10 Pain Score: 8  Pain Location: back/ R hip Pain Descriptors / Indicators: Discomfort,  Constant Pain Intervention(s): Monitored during session, Premedicated before session    Home Living                          Prior Function            PT Goals (current goals can now be found in the care plan section) Acute Rehab PT Goals Patient Stated Goal: return home Progress towards PT goals: Progressing toward goals    Frequency    7X/week      PT Plan      Co-evaluation              AM-PAC PT "6 Clicks" Mobility   Outcome Measure  Help needed turning from your back to your side while in a flat bed without using bedrails?: A Little Help needed moving from lying on your back to sitting on the side of a flat bed without using bedrails?: A Little Help needed moving to and from a bed to a chair (including a wheelchair)?: A Little Help needed standing up from a chair using your arms (e.g., wheelchair or bedside chair)?: A Little Help needed to walk in hospital room?: A Little Help needed climbing 3-5 steps with a railing? : A Little 6 Click Score: 18    End of Session Equipment Utilized During Treatment: Back brace Activity Tolerance: Patient tolerated treatment well Patient left: in chair;with call bell/phone within reach;with family/visitor present   PT Visit Diagnosis: Other abnormalities of gait and mobility (R26.89);Muscle weakness (generalized) (M62.81);Difficulty in walking, not elsewhere classified (R26.2)     Time: 1610-9604 PT Time Calculation (min) (ACUTE ONLY): 28 min  Charges:    $Therapeutic Activity: 23-37 mins PT General Charges $$ ACUTE PT VISIT: 1 Visit                    Meshell Abdulaziz, PT, SPT 11:28 AM,01/30/23

## 2023-01-30 NOTE — TOC Transition Note (Addendum)
Transition of Care [facility]) - CM/SW Discharge Note   Patient Details  Name: [name] MRN: [id number] Date of Birth: 1951-01-28  [facility]) CM/SW Contact:  Liliana Cline, LCSW Phone Number: 01/30/2023, 11:23 AM   Clinical Narrative:    Patient to DC home today.  Per [facility], they are able to accept however typically [facility] accepts [place] patients.  Reached out to [place] with [facility], they have accepted the patient for Home Health and are aware of DC home today.    Final next level of care: Home w Home Health Services Barriers to Discharge: Barriers Resolved   Patient Goals and CMS Choice      Discharge Placement                         Discharge Plan and Services Additional resources added to the After Visit Summary for     Discharge Planning Services: CM Consult            DME Arranged: N/A DME Agency: NA       HH Arranged: PT, OT HH Agency: [facility] ([facility]) Date HH Agency Contacted: 01/30/23 Time HH Agency Contacted: [phone] Representative spoke with at [facility] Agency: Barbara Cower  Social Determinants of Health (SDOH) Interventions SDOH Screenings   Food Insecurity: No Food Insecurity (01/28/2023)  Housing: Low Risk  (10/24/2022)  Transportation Needs: No Transportation Needs (10/24/2022)  Utilities: Not At Risk (10/24/2022)  Tobacco Use: Low Risk  (01/26/2023)  Recent Concern: Tobacco Use - Medium Risk (12/14/2022)   Received from [facility], [facility]     Readmission Risk Interventions     No data to display

## 2023-01-30 NOTE — Discharge Summary (Signed)
Discharge Summary  Patient ID: [name] MRN: [id number] DOB/AGE: May 25, 1951 72 y.o.  Admit date: 01/26/2023 Discharge date: 01/30/2023  Admission Diagnoses: Pseudarthrosis after fusion M96.0, Lumbar adjacent segment disease with spondylolisthesis M51.36, M43.16 , Retrolisthesis of vertebrae M43.10 , Chronic bilateral low back pain with bilateral sciatica M54.42, M54.41, G89.29 , Spinal stenosis, thoracic M48.04.   Discharge Diagnoses:  Principal Problem:   S/P spinal fusion Active Problems:   Pseudarthrosis following spinal fusion   Retrolisthesis   Chronic bilateral low back pain with bilateral sciatica   Lumbar adjacent segment disease with spondylolisthesis   Discharged Condition: good  Hospital Course:  [name] is a 73 year old male presenting with pseudoarthrosis, spondylolisthesis, and chronic back and bilateral leg pain status post L2-3 XLIF, L3-4 TLIF, T12 and L1 and L1-2 decompression, T10 - pelvis PSF.  His intraoperative course was uncomplicated and he was admitted for drain output monitoring, pain control, and therapy evaluation.  His drains were removed when the output reduced to an acceptable level.  He was seen by therapy and deemed appropriate for discharge home with home health services.  He suffered from constipation during his hospital stay which improved on postop day 4.  He was discharged home on postop day 4 with medications for pain, muscle relaxer, and stool softener.  Consults: None  Significant Diagnostic Studies: none  Treatments: surgery: As above.  Please see separately dictated operative report for further details.  Discharge Exam: Blood pressure 137/80, pulse 86, temperature 98.1 F (36.7 C), temperature source Oral, resp. rate 18, height 5\' 9"  (1.753 m), weight 106.3 kg, SpO2 97%.  AA Ox3 CNI   Strength:5/5 throughout BLE  Incision was clean dry and intact  Disposition: Discharge disposition: 01-Home or Self  Care       Discharge Instructions     Incentive spirometry RT   Complete by: As directed    Remove dressing in 24 hours   Complete by: As directed       Allergies as of 01/30/2023       Reactions   Gabapentin Other (See Comments)   Severe shakiness   Lamotrigine Other (See Comments)   falls        Medication List     TAKE these medications    amLODipine 10 MG tablet Commonly known as: NORVASC Take 10 mg by mouth every morning.   amoxicillin 500 MG capsule Commonly known as: AMOXIL Take 2,000 mg by mouth See admin instructions. 1 hour prior to dental procedures   ARIPiprazole 2 MG tablet Commonly known as: ABILIFY Take 2 mg by mouth every morning.   celecoxib 200 MG capsule Commonly known as: CELEBREX Take 1 capsule (200 mg total) by mouth 2 (two) times daily.   clonazePAM 0.5 MG tablet Commonly known as: KLONOPIN Take 0.5-1 mg by mouth 2 (two) times daily as needed for anxiety.   cyanocobalamin 1000 MCG tablet Commonly known as: VITAMIN B12 Take 2,000 mcg by mouth daily.   diazepam 5 MG tablet Commonly known as: VALIUM Take 1 tablet (5 mg total) by mouth every 8 (eight) hours as needed for up to 3 days for muscle spasms.   DULoxetine 60 MG capsule Commonly known as: CYMBALTA 60 mg every morning.   fluticasone 50 MCG/ACT nasal spray Commonly known as: FLONASE Place 2 sprays into both nostrils as needed for allergies or rhinitis.   furosemide 40 MG tablet Commonly known as: LASIX Take 40 mg by mouth daily as needed for fluid.   ipratropium 0.03 %  nasal spray Commonly known as: ATROVENT Place 2 sprays into both nostrils 2 (two) times daily.   losartan 25 MG tablet Commonly known as: COZAAR Take 25 mg by mouth daily.   Lumify 0.025 % Soln Generic drug: Brimonidine Tartrate Place 1 drop into both eyes 2 (two) times daily as needed (redness).   methocarbamol 500 MG tablet Commonly known as: ROBAXIN Take 1 tablet (500 mg total) by mouth  every 6 (six) hours.   multivitamin with minerals Tabs tablet Take 1 tablet by mouth daily.   NEEDLE (DISP) 23 G 23G X 1" Misc Use 1 Units every 10 (ten) days.   oxyCODONE 5 MG immediate release tablet Commonly known as: Oxy IR/ROXICODONE Take 1-2 tablets (5-10 mg total) by mouth every 4 (four) hours as needed for up to 7 days for moderate pain or severe pain ((score 4 to 6)). What changed:  how much to take reasons to take this   senna 8.6 MG Tabs tablet Commonly known as: SENOKOT Take 1 tablet (8.6 mg total) by mouth daily as needed for mild constipation.   sodium fluoride 1.1 % Gel dental gel Commonly known as: FLUORISHIELD Place 1 application onto teeth daily.   spironolactone 25 MG tablet Commonly known as: ALDACTONE Take 25 mg by mouth every morning.   testosterone cypionate 200 MG/ML injection Commonly known as: DEPOTESTOSTERONE CYPIONATE ADMINISTER 0.5 ML IN THE MUSCLE 1 TIME A WEEK   traZODone 100 MG tablet Commonly known as: DESYREL Take 100 mg by mouth at bedtime.   VITAMIN D-3 PO Take 1 tablet by mouth daily at 6 (six) AM.        Follow-up Information     [name], PA-C Follow up on 02/10/2023.   Specialty: Neurosurgery Contact information: [address] [place] [address] [phone]                 Signed: Susanne Borders 01/30/2023, 3:54 PM

## 2023-01-30 NOTE — Progress Notes (Signed)
Mobility Specialist - Progress Note   01/30/23 1012  Mobility  Activity Transferred from chair to bed;Ambulated with assistance in room  Level of Assistance Standby assist, set-up cues, supervision of patient - no hands on  Assistive Device Front wheel walker  Distance Ambulated (ft) 20 ft  Activity Response Tolerated well  $Mobility charge 1 Mobility  Mobility Specialist Start Time (ACUTE ONLY) 1005  Mobility Specialist Stop Time (ACUTE ONLY) 1011  Mobility Specialist Time Calculation (min) (ACUTE ONLY) 6 min   Pt sitting in the recliner upon entry, utilizing RA. Pt requesting to transfer to bed. Pt STS to RW ModI and amb around the bed with supervision- no assistance required. Pt left supine with needs within reach. RN notified.   Zetta Bills Mobility Specialist 01/30/23 10:16 AM

## 2023-01-30 NOTE — Progress Notes (Signed)
Pt d/c home from hospital. AVS reviewed with pt and his wife. Pt voices understanding to given instructions.

## 2023-01-30 NOTE — Plan of Care (Signed)

## 2023-02-02 ENCOUNTER — Telehealth: Payer: Self-pay | Admitting: Neurosurgery

## 2023-02-02 DIAGNOSIS — R399 Unspecified symptoms and signs involving the genitourinary system: Secondary | ICD-10-CM | POA: Diagnosis not present

## 2023-02-02 DIAGNOSIS — R339 Retention of urine, unspecified: Secondary | ICD-10-CM | POA: Diagnosis not present

## 2023-02-02 NOTE — Telephone Encounter (Signed)
LMOM for patient to return call.

## 2023-02-02 NOTE — Telephone Encounter (Signed)
L2-3 XLIF, T10-pelvis PSF, T12-L1 decompression, L1-2 PSD, L5-S1 TLIF on 01/26/23  Patient's wife calling that Saturday he started with uti symptoms, dribbling and hurts really bad when he tries to go. Can you call him something in to treat please.  [address] in [place]

## 2023-02-02 NOTE — Telephone Encounter (Signed)
Patient's wife notified and voiced understanding.

## 2023-02-02 NOTE — Telephone Encounter (Signed)
Patient's wife returned call and states he is only urinating very little. No urinary or bowel incontinence. He has not had a bowel movement since Friday. No numbness when wiping. His sciatic nerve on the left leg is hurting pretty bad, from buttocks to the knee. He states no numbness or tingling just sharp pain. Wife would like a call back at [phone]

## 2023-02-02 NOTE — Telephone Encounter (Signed)
Please call him and find out more information.   Is he having any urinary or bowel incontinence? Is he able to urinate, but it burns? Any numbness when he wipes?   How are his back and legs doing? Any weakness or numbness in legs?

## 2023-02-02 NOTE — Telephone Encounter (Signed)
Recommend he go to UC or call urology about the urinary issues.   For the bowels, he can take over the counter milk of magnesia as directed on the bottle.

## 2023-02-03 DIAGNOSIS — N400 Enlarged prostate without lower urinary tract symptoms: Secondary | ICD-10-CM | POA: Diagnosis not present

## 2023-02-03 DIAGNOSIS — M5459 Other low back pain: Secondary | ICD-10-CM | POA: Diagnosis not present

## 2023-02-03 DIAGNOSIS — I10 Essential (primary) hypertension: Secondary | ICD-10-CM | POA: Diagnosis not present

## 2023-02-03 DIAGNOSIS — Z4789 Encounter for other orthopedic aftercare: Secondary | ICD-10-CM | POA: Diagnosis not present

## 2023-02-03 DIAGNOSIS — K219 Gastro-esophageal reflux disease without esophagitis: Secondary | ICD-10-CM | POA: Diagnosis not present

## 2023-02-03 DIAGNOSIS — M109 Gout, unspecified: Secondary | ICD-10-CM | POA: Diagnosis not present

## 2023-02-03 DIAGNOSIS — G629 Polyneuropathy, unspecified: Secondary | ICD-10-CM | POA: Diagnosis not present

## 2023-02-03 DIAGNOSIS — Z8619 Personal history of other infectious and parasitic diseases: Secondary | ICD-10-CM | POA: Diagnosis not present

## 2023-02-03 DIAGNOSIS — Z981 Arthrodesis status: Secondary | ICD-10-CM | POA: Diagnosis not present

## 2023-02-03 DIAGNOSIS — G4730 Sleep apnea, unspecified: Secondary | ICD-10-CM | POA: Diagnosis not present

## 2023-02-04 ENCOUNTER — Telehealth: Payer: Self-pay | Admitting: Neurosurgery

## 2023-02-04 NOTE — Telephone Encounter (Signed)
  Media Information   Please see attached message

## 2023-02-04 NOTE — Telephone Encounter (Signed)
Okay to give orders for HHPT.   He can alternate tylenol with his oxycodone. He should also be taking robaxin prn and he has celebrex on his med list as well to take.

## 2023-02-04 NOTE — Telephone Encounter (Signed)
LVM with [name]

## 2023-02-04 NOTE — Telephone Encounter (Signed)
Received a call from [facility] advised per Mauri Brooklyn P.A : Okay to give orders for HHPT.    He can alternate tylenol with his oxycodone. He should also be taking robaxin prn and he has celebrex on his med list as well to take.

## 2023-02-05 ENCOUNTER — Telehealth: Payer: Self-pay | Admitting: Gerontology

## 2023-02-05 ENCOUNTER — Telehealth: Payer: Self-pay | Admitting: Neurosurgery

## 2023-02-05 MED ORDER — OXYCODONE HCL 5 MG PO TABS: 5.0000 mg | ORAL_TABLET | ORAL | 0 refills | Status: DC | PRN

## 2023-02-05 NOTE — Telephone Encounter (Signed)
Error

## 2023-02-05 NOTE — Telephone Encounter (Signed)
L2-3 XLIF, T10-pelvis PSF, T12-L1 decompression, L1-2 PSD, L5-S1 TLIF on 01/26/23  Refill for oxycodone 5mg --in a lot of pain can he increase the dosage? [facility] [place] He wants to make sure he has enough through the long weekend

## 2023-02-05 NOTE — Telephone Encounter (Signed)
Patient notified of pain med refill and he is taking all medications as directed.

## 2023-02-05 NOTE — Telephone Encounter (Signed)
[name] from [facility] has called to request VERBAL ORDERS:  Occupational therapy 1 time a week for 4 weeks   [name]

## 2023-02-05 NOTE — Telephone Encounter (Signed)
I have left a message on [name] confidential voicemail stating that it was ok for OT 1x a week for 4 weeks.

## 2023-02-05 NOTE — Telephone Encounter (Signed)
He was given oxycodone 5mg  to take 1-2 q 4 hours as needed. If he was taking 2 every 4 hours then he would be out.   PMP reviewed and is appropriate.   He should also be taking celebrex as directed and methocarbamol as needed.   Allergy noted to neurontin.   He can continue to take the oxycodone 1-2 every 4 hours as needed. Refill sent to pharmacy. Please let him know.

## 2023-02-06 NOTE — Progress Notes (Unsigned)
   REFERRING PHYSICIAN:  [name], [address] [place],  [place] [address]  DOS: 01/26/23 L2-3 XLIF, L3-4 TLIF, T12 and L1 and L1-2 decompression, T10 - pelvis PSF  HISTORY OF PRESENT ILLNESS: [name] is approximately 2 weeks status post  L2-3 XLIF, L3-4 TLIF, T12 and L1 and L1-2 decompression, T10 - pelvis PSF. Was given robaxin and oxycodone on discharge from the hospital. He was given 3 day supply of valium as well. He was taking celebrex prior to surgery.   Seen by PCP last week for possible UTI- he was given flomax. No UTI noted. He is voiding and moving his bowels well.   He feels like he is walking much better since the surgery. He has some back pain from his surgery with some radiation into his hips and legs. He notes some pain at the top of his incision. He has seen some improvement in his leg pain in the last few days.   He is taking oxycodone and robaxin. He is taking his oxycocone 1-2 close to every 4 hours during the day, but is going longer at night. He is still on celebrex.   He is allergic to neurontin.   PHYSICAL EXAMINATION:  General: Patient is well developed, well nourished, calm, collected, and in no apparent distress.   NEUROLOGICAL:  General: In no acute distress.   Awake, alert, oriented to person, place, and time.  Pupils equal round and reactive to light.  Facial tone is symmetric.     Strength:            Side Iliopsoas Quads Hamstring PF DF EHL  R 5 5 5 5 5 5   L 5 5 5 5 5 5    Incisions c/d/I. He has some minimal swelling noted at distal portion of posterior lumbar incision. No erythema or signs of infection noted.    ROS (Neurologic):  Negative except as noted above  IMAGING: Nothing new to review.   ASSESSMENT/PLAN:  JAHMIERE HEARN is doing well s/p above surgery. Treatment options reviewed with patient and following plan made:   - I have advised the patient to lift up to 10 pounds until 6 weeks after surgery  (follow up with Dr. Myer Haff).  - Reviewed wound care.  - Continue to wear brace when up and walking.  - No bending, twisting, or lifting.  - Continue on current medications including prn oxycodone, celebrex, and robaxin. No refills needed.  - Follow up as scheduled in 4 weeks and prn. Will need xrays prior to this visit.   Advised to contact the office if any questions or concerns arise.  [name] PA-C Department of neurosurgery

## 2023-02-10 ENCOUNTER — Encounter: Payer: Self-pay | Admitting: Neurosurgery

## 2023-02-10 ENCOUNTER — Ambulatory Visit (INDEPENDENT_AMBULATORY_CARE_PROVIDER_SITE_OTHER): Payer: HMO | Admitting: Orthopedic Surgery

## 2023-02-10 VITALS — BP 122/74 | Temp 98.7°F | Ht 69.0 in | Wt 234.0 lb

## 2023-02-10 DIAGNOSIS — M4316 Spondylolisthesis, lumbar region: Secondary | ICD-10-CM

## 2023-02-10 DIAGNOSIS — M960 Pseudarthrosis after fusion or arthrodesis: Secondary | ICD-10-CM

## 2023-02-10 DIAGNOSIS — Z09 Encounter for follow-up examination after completed treatment for conditions other than malignant neoplasm: Secondary | ICD-10-CM

## 2023-02-10 DIAGNOSIS — Z981 Arthrodesis status: Secondary | ICD-10-CM

## 2023-02-10 DIAGNOSIS — M5136 Other intervertebral disc degeneration, lumbar region: Secondary | ICD-10-CM

## 2023-02-11 ENCOUNTER — Encounter: Payer: Self-pay | Admitting: Neurosurgery

## 2023-02-11 ENCOUNTER — Inpatient Hospital Stay: Payer: HMO

## 2023-02-11 ENCOUNTER — Inpatient Hospital Stay: Payer: HMO | Attending: Internal Medicine

## 2023-02-11 DIAGNOSIS — D751 Secondary polycythemia: Secondary | ICD-10-CM | POA: Diagnosis not present

## 2023-02-11 LAB — HEMOGLOBIN AND HEMATOCRIT (CANCER CENTER ONLY)
HCT: 38.2 % — ABNORMAL LOW (ref 39.0–52.0)
Hemoglobin: 12.2 g/dL — ABNORMAL LOW (ref 13.0–17.0)

## 2023-02-11 NOTE — Progress Notes (Signed)
Hct 38.2 today. NO phlebotomy needed today

## 2023-02-17 ENCOUNTER — Telehealth: Payer: Self-pay | Admitting: Neurosurgery

## 2023-02-17 DIAGNOSIS — Z981 Arthrodesis status: Secondary | ICD-10-CM

## 2023-02-17 DIAGNOSIS — M960 Pseudarthrosis after fusion or arthrodesis: Secondary | ICD-10-CM

## 2023-02-17 MED ORDER — OXYCODONE HCL 5 MG PO TABS
5.0000 mg | ORAL_TABLET | ORAL | 0 refills | Status: AC | PRN
Start: 2023-02-17 — End: ?

## 2023-02-17 NOTE — Telephone Encounter (Signed)
[facility] Oxycodone 5mg  1-2 tabs every 4 hours as needed

## 2023-02-17 NOTE — Telephone Encounter (Signed)
I notified Mr [name] that his refill has been sent and the new directions are for 1 every 4 hours and that we will continue to wean his dose

## 2023-02-17 NOTE — Telephone Encounter (Signed)
His last 5 day supply of oxycodone lasted for 13 days.   Will change directions to 1 every 4 hours this script and plan to go to 1 every 6 hours at next script.   PMP reviewed and is appropriate.   Please let him know refill was called in and that I changed directions to 1 every 4 hours as needed. He should continue to take the least amount possible.

## 2023-02-18 ENCOUNTER — Encounter: Payer: Self-pay | Admitting: Neurosurgery

## 2023-02-23 ENCOUNTER — Encounter: Payer: Self-pay | Admitting: Neurosurgery

## 2023-02-23 DIAGNOSIS — Z981 Arthrodesis status: Secondary | ICD-10-CM

## 2023-02-23 MED ORDER — CEPHALEXIN 500 MG PO CAPS
500.0000 mg | ORAL_CAPSULE | Freq: Four times a day (QID) | ORAL | 0 refills | Status: AC
Start: 2023-02-23 — End: 2023-03-05

## 2023-02-23 NOTE — Telephone Encounter (Signed)
Spoke with patient. Let him know to pick up keflex. Follow up made to see me on Tuesday 9/24 for incision check.   He will let us know if incision looks worse, opens up, drains more, or if he develops fevers or chills.

## 2023-02-23 NOTE — Telephone Encounter (Signed)
Do you want to put him on keflex qid x 10 days and have him f/u with me next week for wound check? I can also see him tomorrow if needed.   Started 3 days ago. No fevers or chills. He has noted mild drainage that he says is red.

## 2023-02-27 NOTE — Progress Notes (Unsigned)
   REFERRING PHYSICIAN:  [name], [address] [place],  [place] [address]  DOS: 01/26/23 L2-3 XLIF, L3-4 TLIF, T12 and L1 and L1-2 decompression, T10 - pelvis PSF  HISTORY OF PRESENT ILLNESS: [name] called last week with some incision concerns. He sent a picture and he ws called in keflex x 10 days.   He is here for an incision check.   He is feeling good- no fevers, chills. He has noticed only minimal drainage. He is wearing his brace when up and walking. He feels like he is walking better and able to walk more. He still has some back pain. He has some buttock/hip pain with walking. He is not having much leg pain, but notes they get tired with walking.   He is taking prn oxycodone and robaxin. He is taking celebrex. No refills needed.   PHYSICAL EXAMINATION:  General: Patient is well developed, well nourished, calm, collected, and in no apparent distress.   NEUROLOGICAL:  General: In no acute distress.   Awake, alert, oriented to person, place, and time.  Pupils equal round and reactive to light.  Facial tone is symmetric.    Strength:            Side Iliopsoas Quads Hamstring PF DF EHL  R 5 5 5 5 5 5   L 5 5 5 5 5 5      Posterior lumbar incision as above. Looks better with no significant erythema. Only a spot of serous drainage on dressing. I cannot express any drainage. No fluctuance.   ROS (Neurologic):  Negative except as noted above  IMAGING: Nothing new to review.   ASSESSMENT/PLAN:  [name] is doing well s/p above surgery. Treatment options reviewed with patient and following plan made:   - Will start medihoney daily to incision with dry dressing. Keep incision clean and dry.  - Continue with brace when up and walking.  - No bending, twisting, or lifting.  - Continue on current medications including prn oxycodone, celebrex, and robaxin. No refills needed.  - Follow up as scheduled in 2 weeks and prn. Will need xrays prior to this  visit.   Advised to contact the office if any questions or concerns arise.  Jorge Leach PA-C Department of neurosurgery

## 2023-03-03 ENCOUNTER — Ambulatory Visit (INDEPENDENT_AMBULATORY_CARE_PROVIDER_SITE_OTHER): Payer: HMO | Admitting: Orthopedic Surgery

## 2023-03-03 ENCOUNTER — Encounter: Payer: Self-pay | Admitting: Orthopedic Surgery

## 2023-03-03 ENCOUNTER — Telehealth: Payer: Self-pay

## 2023-03-03 VITALS — BP 122/82 | Temp 97.7°F | Ht 69.0 in | Wt 234.0 lb

## 2023-03-03 DIAGNOSIS — Z09 Encounter for follow-up examination after completed treatment for conditions other than malignant neoplasm: Secondary | ICD-10-CM

## 2023-03-03 DIAGNOSIS — Z981 Arthrodesis status: Secondary | ICD-10-CM

## 2023-03-03 DIAGNOSIS — M960 Pseudarthrosis after fusion or arthrodesis: Secondary | ICD-10-CM

## 2023-03-03 NOTE — Telephone Encounter (Signed)
He has spinal hardware and we have been treating him for incision concerns. He should reschedule dental work until 3 months out from surgery.  Attempted to contact patient to discuss. No answer, mailbox full. Sent mychart message with recommendation to reschedule any dental appointments until he is 3 months out from surgery.

## 2023-03-04 NOTE — Telephone Encounter (Signed)
Patient notified and voiced understanding. He will reschedule dental work.

## 2023-03-09 ENCOUNTER — Other Ambulatory Visit: Payer: Self-pay

## 2023-03-09 DIAGNOSIS — M4804 Spinal stenosis, thoracic region: Secondary | ICD-10-CM

## 2023-03-09 DIAGNOSIS — M51369 Other intervertebral disc degeneration, lumbar region without mention of lumbar back pain or lower extremity pain: Secondary | ICD-10-CM

## 2023-03-09 DIAGNOSIS — M47816 Spondylosis without myelopathy or radiculopathy, lumbar region: Secondary | ICD-10-CM

## 2023-03-10 ENCOUNTER — Encounter: Payer: Self-pay | Admitting: Neurosurgery

## 2023-03-10 ENCOUNTER — Ambulatory Visit (INDEPENDENT_AMBULATORY_CARE_PROVIDER_SITE_OTHER): Payer: HMO | Admitting: Neurosurgery

## 2023-03-10 ENCOUNTER — Ambulatory Visit
Admission: RE | Admit: 2023-03-10 | Discharge: 2023-03-10 | Disposition: A | Discharge status: Home / Self Care | Payer: HMO | Source: Ambulatory Visit | Attending: Neurosurgery | Admitting: Neurosurgery

## 2023-03-10 ENCOUNTER — Ambulatory Visit
Admission: RE | Admit: 2023-03-10 | Discharge: 2023-03-10 | Disposition: A | Discharge status: Home / Self Care | Payer: HMO | Attending: Neurosurgery | Admitting: Neurosurgery

## 2023-03-10 VITALS — BP 122/80 | Ht 69.0 in | Wt 234.0 lb

## 2023-03-10 DIAGNOSIS — Z4789 Encounter for other orthopedic aftercare: Secondary | ICD-10-CM | POA: Diagnosis not present

## 2023-03-10 DIAGNOSIS — M4804 Spinal stenosis, thoracic region: Secondary | ICD-10-CM

## 2023-03-10 DIAGNOSIS — M51369 Other intervertebral disc degeneration, lumbar region without mention of lumbar back pain or lower extremity pain: Secondary | ICD-10-CM

## 2023-03-10 DIAGNOSIS — M47816 Spondylosis without myelopathy or radiculopathy, lumbar region: Secondary | ICD-10-CM | POA: Diagnosis not present

## 2023-03-10 DIAGNOSIS — Z09 Encounter for follow-up examination after completed treatment for conditions other than malignant neoplasm: Secondary | ICD-10-CM

## 2023-03-10 DIAGNOSIS — M4316 Spondylolisthesis, lumbar region: Secondary | ICD-10-CM

## 2023-03-10 DIAGNOSIS — M960 Pseudarthrosis after fusion or arthrodesis: Secondary | ICD-10-CM

## 2023-03-10 DIAGNOSIS — Z981 Arthrodesis status: Secondary | ICD-10-CM | POA: Diagnosis not present

## 2023-03-10 NOTE — Progress Notes (Signed)
   REFERRING PHYSICIAN:  [name], [address] [place],  [place] [address]  DOS: 01/26/23 L2-3 XLIF, L3-4 TLIF, T12 and L1 and L1-2 decompression, T10 - pelvis PSF  HISTORY OF PRESENT ILLNESS: [name] is here in follow-up after his thoracolumbar fusion.  He is doing well.  His pain overall is improved compared to before surgery.    PHYSICAL EXAMINATION:  General: Patient is well developed, well nourished, calm, collected, and in no apparent distress.   NEUROLOGICAL:  General: In no acute distress.   Awake, alert, oriented to person, place, and time.  Pupils equal round and reactive to light.  Facial tone is symmetric.    Strength:            Side Iliopsoas Quads Hamstring PF DF EHL  R 5 5 5 5 5 5   L 5 5 5 5 5 5      Posterior lumbar incision shows no evidence of erythema.  He has a dressing on the bottom portion of his incision.  It is almost healed.  ROS (Neurologic):  Negative except as noted above  IMAGING: No complications noted  ASSESSMENT/PLAN:  [name] is doing well s/p above surgery.  I am pleased with his improvements.  We reviewed his activity limitations.  I have released him to start light exercise.  He will continue dressing changes for now.  I suspect his incision will be fully healed in the next couple of weeks.  Will see him in clinic in 6 weeks or so.  I released him to begin taking breaks from his brace for a couple of hours morning and night.  Venetia Night MD Department of neurosurgery

## 2023-04-13 NOTE — Progress Notes (Unsigned)
   REFERRING PHYSICIAN:  [name], [address] [place],  [place] [address]  DOS: 01/26/23 L2-3 XLIF, L3-4 TLIF, T12 and L1 and L1-2 decompression, T10 - pelvis PSF  HISTORY OF PRESENT ILLNESS:  He was doing well at  his last visit with Dr. Myer Haff.   He's having some intermittent spasms in his hips/buttocks when he walks. It goes away after he stops to sit. No leg pain. He is walking more than he was prior to surgery. He still feels much better then he did prior to surgery.   He continues on celebrex. Has robaxin, but it does not help.    PHYSICAL EXAMINATION:  General: Patient is well developed, well nourished, calm, collected, and in no apparent distress.   NEUROLOGICAL:  General: In no acute distress.   Awake, alert, oriented to person, place, and time.  Pupils equal round and reactive to light.  Facial tone is symmetric.    Strength:            Side Iliopsoas Quads Hamstring PF DF EHL  R 5 5 5 5 5 5   L 5 5 5 5 5 5    Incisions are well healed   ROS (Neurologic):  Negative except as noted above  IMAGING: Lumbar xrays dated 04/16/23:  No complications noted.  Report for above xrays not yet available.    ASSESSMENT/PLAN:  NICKOLAI RINKS is doing well s/p above surgery. Treatment options reviewed with patient and following plan made:   - Will slowly return to activity as tolerated. Recommend he continue to avoid heavy lifting.  - Discussed starting PT. He declines for now.  - Follow up in 6 months for recheck. Will need xrays prior.   BP was elevated. No symptoms of chest pain, shortness of breath, blurry vision, or headaches. He checks BP at home and it generally runs 130-140s/80-90s. Will recheck at home and call PCP if not improved. If he develops CP, SOB, blurry vision, or headaches, then he will go to ED.     Advised to contact the office if any questions or concerns arise.  [name] PA-C Department of neurosurgery

## 2023-04-15 ENCOUNTER — Other Ambulatory Visit: Payer: Self-pay

## 2023-04-15 DIAGNOSIS — M4316 Spondylolisthesis, lumbar region: Secondary | ICD-10-CM

## 2023-04-16 ENCOUNTER — Ambulatory Visit
Admission: RE | Admit: 2023-04-16 | Discharge: 2023-04-16 | Disposition: A | Discharge status: Home / Self Care | Payer: HMO | Attending: Orthopedic Surgery | Admitting: Orthopedic Surgery

## 2023-04-16 ENCOUNTER — Ambulatory Visit
Admission: RE | Admit: 2023-04-16 | Discharge: 2023-04-16 | Disposition: A | Discharge status: Home / Self Care | Payer: HMO | Source: Ambulatory Visit | Attending: Orthopedic Surgery | Admitting: Orthopedic Surgery

## 2023-04-16 ENCOUNTER — Encounter: Payer: Self-pay | Admitting: Orthopedic Surgery

## 2023-04-16 ENCOUNTER — Ambulatory Visit (INDEPENDENT_AMBULATORY_CARE_PROVIDER_SITE_OTHER): Payer: HMO | Admitting: Orthopedic Surgery

## 2023-04-16 VITALS — BP 132/94 | Ht 69.0 in | Wt 234.0 lb

## 2023-04-16 DIAGNOSIS — Z981 Arthrodesis status: Secondary | ICD-10-CM | POA: Diagnosis not present

## 2023-04-16 DIAGNOSIS — M2578 Osteophyte, vertebrae: Secondary | ICD-10-CM | POA: Diagnosis not present

## 2023-04-16 DIAGNOSIS — M51369 Other intervertebral disc degeneration, lumbar region without mention of lumbar back pain or lower extremity pain: Secondary | ICD-10-CM | POA: Insufficient documentation

## 2023-04-16 DIAGNOSIS — M4316 Spondylolisthesis, lumbar region: Secondary | ICD-10-CM | POA: Insufficient documentation

## 2023-04-16 DIAGNOSIS — I700 Atherosclerosis of aorta: Secondary | ICD-10-CM | POA: Diagnosis not present

## 2023-04-16 NOTE — Patient Instructions (Addendum)
Your blood pressure was elevated today. I want you to recheck it at home and follow up with your PCP if it remains high. If you have any chest pain, shortness of breath, blurry vision, or headaches then you need to go to ED.    Call if you need anything before your next appointment.   You see Dr. Myer Haff on Thursday 10/15/23 at 10:30. Will need to show up for xrays 1 hour prior.   Kennyth Arnold  [phone]

## 2023-05-13 ENCOUNTER — Inpatient Hospital Stay: Payer: HMO

## 2023-05-15 ENCOUNTER — Inpatient Hospital Stay: Payer: HMO

## 2023-05-15 ENCOUNTER — Inpatient Hospital Stay: Payer: HMO | Attending: Internal Medicine

## 2023-05-15 DIAGNOSIS — D751 Secondary polycythemia: Secondary | ICD-10-CM | POA: Insufficient documentation

## 2023-05-15 LAB — HEMOGLOBIN AND HEMATOCRIT (CANCER CENTER ONLY)
HCT: 48.3 % (ref 39.0–52.0)
Hemoglobin: 15.6 g/dL (ref 13.0–17.0)

## 2023-05-15 NOTE — Progress Notes (Signed)
Hct 48.3 - per Dr. Leonard Schwartz- hold phlebotomy if hct is less than 50. Pt aware of results. He will contact our office if he becomes symptomatic. Also discussed that if he find that his hct has rose above 50 in the upcoming months, to let us know. He may have labs at pcp or urology office. F/u in cancer center is not until March. Pt gave verbal understanding.

## 2023-06-30 DIAGNOSIS — Z0000 Encounter for general adult medical examination without abnormal findings: Secondary | ICD-10-CM | POA: Diagnosis not present

## 2023-06-30 DIAGNOSIS — E66811 Obesity, class 1: Secondary | ICD-10-CM | POA: Diagnosis not present

## 2023-06-30 DIAGNOSIS — E299 Testicular dysfunction, unspecified: Secondary | ICD-10-CM | POA: Diagnosis not present

## 2023-06-30 DIAGNOSIS — F411 Generalized anxiety disorder: Secondary | ICD-10-CM | POA: Diagnosis not present

## 2023-06-30 DIAGNOSIS — R911 Solitary pulmonary nodule: Secondary | ICD-10-CM | POA: Diagnosis not present

## 2023-06-30 DIAGNOSIS — D751 Secondary polycythemia: Secondary | ICD-10-CM | POA: Diagnosis not present

## 2023-06-30 DIAGNOSIS — D72819 Decreased white blood cell count, unspecified: Secondary | ICD-10-CM | POA: Diagnosis not present

## 2023-06-30 DIAGNOSIS — G4730 Sleep apnea, unspecified: Secondary | ICD-10-CM | POA: Diagnosis not present

## 2023-06-30 DIAGNOSIS — I10 Essential (primary) hypertension: Secondary | ICD-10-CM | POA: Diagnosis not present

## 2023-06-30 DIAGNOSIS — F419 Anxiety disorder, unspecified: Secondary | ICD-10-CM | POA: Diagnosis not present

## 2023-06-30 DIAGNOSIS — F5104 Psychophysiologic insomnia: Secondary | ICD-10-CM | POA: Diagnosis not present

## 2023-06-30 DIAGNOSIS — R339 Retention of urine, unspecified: Secondary | ICD-10-CM | POA: Diagnosis not present

## 2023-06-30 DIAGNOSIS — G894 Chronic pain syndrome: Secondary | ICD-10-CM | POA: Diagnosis not present

## 2023-06-30 DIAGNOSIS — R7303 Prediabetes: Secondary | ICD-10-CM | POA: Diagnosis not present

## 2023-06-30 DIAGNOSIS — M62830 Muscle spasm of back: Secondary | ICD-10-CM | POA: Diagnosis not present

## 2023-07-28 ENCOUNTER — Other Ambulatory Visit: Payer: Self-pay | Admitting: *Deleted

## 2023-07-28 DIAGNOSIS — E291 Testicular hypofunction: Secondary | ICD-10-CM

## 2023-07-29 ENCOUNTER — Other Ambulatory Visit: Payer: HMO

## 2023-07-29 DIAGNOSIS — E291 Testicular hypofunction: Secondary | ICD-10-CM

## 2023-07-30 LAB — TESTOSTERONE: Testosterone: 1156 ng/dL — ABNORMAL HIGH (ref 264–916)

## 2023-07-31 ENCOUNTER — Ambulatory Visit: Payer: HMO | Admitting: Urology

## 2023-07-31 ENCOUNTER — Encounter: Payer: Self-pay | Admitting: Urology

## 2023-07-31 VITALS — BP 139/84 | HR 83 | Ht 68.0 in | Wt 225.0 lb

## 2023-07-31 DIAGNOSIS — Z125 Encounter for screening for malignant neoplasm of prostate: Secondary | ICD-10-CM | POA: Diagnosis not present

## 2023-07-31 DIAGNOSIS — R339 Retention of urine, unspecified: Secondary | ICD-10-CM

## 2023-07-31 DIAGNOSIS — N401 Enlarged prostate with lower urinary tract symptoms: Secondary | ICD-10-CM | POA: Diagnosis not present

## 2023-07-31 DIAGNOSIS — E291 Testicular hypofunction: Secondary | ICD-10-CM

## 2023-07-31 LAB — BLADDER SCAN AMB NON-IMAGING: Scan Result: 542

## 2023-07-31 MED ORDER — TAMSULOSIN HCL 0.4 MG PO CAPS: 0.8000 mg | ORAL_CAPSULE | Freq: Every day | ORAL | 2 refills | Status: AC

## 2023-07-31 MED ORDER — TESTOSTERONE CYPIONATE 200 MG/ML IM SOLN
INTRAMUSCULAR | 0 refills | Status: DC
Start: 2023-07-31 — End: 2024-01-29

## 2023-07-31 NOTE — Progress Notes (Signed)
 I, Maysun Anabel Bene, acting as a scribe for [name], MD., have documented all relevant documentation on the behalf of [name], MD, as directed by [name], MD while in the presence of [name], MD.  07/31/2023 8:50 AM   [name] Sep 11, 1950 [id number]  Referring provider: [name], NP [address] [place],  [place] [address]  Chief Complaint  Patient presents with   Hypogonadism    HPI: [name] is a 73 y.o. male presents for annual follow-up.  Since last year's visit, he has had worsening urinary frequency and urgency.  Remains on tamsulosin; IPSS today 27/35 Occasional episodes of urge incontinence.  Continues follow-up visits with hematology for erythrocytosis.  Testosterone level drawn 07/29/23 elevated 1,156, however his level was drawn a few days after his injection.   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   Bilateral sciatica    Chronic back pain    a.) s/p multiple back surgeries   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   Cirrhosis (HCC)    DDD (degenerative disc disease), cervical 11/06/2014   DDD (degenerative disc disease), lumbar 11/06/2014   Depression    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   face, numerous lesions removed    Esophagitis, reflux 12/07/2014   GERD (gastroesophageal reflux disease)     a.) s/p Nissen fundoplication   Hepatitis C virus infection cured after antiviral drug therapy    a.) s/p Tx with ledipasvir/sofosbuvir   History of kidney stones    Idiopathic scoliosis and kyphoscoliosis 03/01/2013   Leukopenia    Neuropathy of both feet (severe)    Painful legs and moving toes    DUE TO BACK PROBLEMS   Prostatitis    Sacroiliac joint dysfunction 11/06/2014   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; b.) 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; c.) followed by hematology --> requires therpeutic phlebotomy for Hct > 50   Sleep apnea    a.) no nocturnal PAP therapy   Spinal stenosis 12/07/2014   Testicular dysfunction 11/04/2011   a.) on exogenous TRT (depotestosterone cypionate)    Surgical History: Past Surgical History:  Procedure Laterality Date   ANTERIOR LAT LUMBAR FUSION N/A 01/26/2023   Procedure: L2-3 LATERAL LUMBAR INTERBODY FUSION;  Surgeon: Venetia Night, MD;  Location: [facility];  Service: Neurosurgery;  Laterality: N/A;   APPLICATION OF INTRAOPERATIVE CT SCAN N/A 01/26/2023   Procedure: APPLICATION OF INTRAOPERATIVE CT SCAN;  Surgeon: Venetia Night, MD;  Location: [facility];  Service: Neurosurgery;  Laterality: N/A;   BACK SURGERY  04/14/2013   L2-S1   Cancer of right ear     3/17   CATARACT EXTRACTION Left 06/10/1987   CERVICAL SPINE SURGERY  X2 FUSION C4-7   CHOLECYSTECTOMY  07/10/2013   COLONOSCOPY WITH PROPOFOL N/A 12/18/2014   Procedure: COLONOSCOPY WITH PROPOFOL;  Surgeon: [name], MD;  Location: [facility];  Service: Endoscopy;  Laterality: N/A;   COLONOSCOPY WITH PROPOFOL N/A 09/23/2021   Procedure: COLONOSCOPY WITH PROPOFOL;  Surgeon: [name], MD;  Location: [facility];  Service: Endoscopy;  Laterality: N/A;   CYSTOSCOPY WITH INSERTION OF UROLIFT N/A 04/02/2021   Procedure: CYSTOSCOPY WITH INSERTION OF UROLIFT;  Surgeon: [name], MD;  Location: [facility];  Service:  Urology;  Laterality: N/A;   ELBOW ARTHROSCOPY Right 02/08/2012   ELBOW SURGERY Left    ESOPHAGOGASTRODUODENOSCOPY (EGD) WITH PROPOFOL N/A 12/18/2014   Procedure: ESOPHAGOGASTRODUODENOSCOPY (EGD) WITH PROPOFOL with dialtion;  Surgeon: [name], MD;  Location: [facility];  Service: Endoscopy;  Laterality: N/A;   ESOPHAGOGASTRODUODENOSCOPY (EGD) WITH PROPOFOL N/A 09/23/2021   Procedure: ESOPHAGOGASTRODUODENOSCOPY (EGD) WITH PROPOFOL;  Surgeon: [name], MD;  Location: [facility];  Service: Endoscopy;  Laterality: N/A;   FACIAL RECONSTRUCTION SURGERY  06/09/1968   BOTTLE CUT NOSE AND EYE, MULTIPLE SURGERIES   FINGER SURGERY Left 06/09/2013   THUMB   FINGER SURGERY Right 06/09/1964   MIDDLE   FOOT SURGERY Right 06/09/1982   FUNDIPLICATION  06/10/1999   HERNIA REPAIR Left 1981/1957   RIGHT SIDE IN 1957   JOINT REPLACEMENT Left 2021   knee   KNEE ARTHROPLASTY Left 02/10/2020   Procedure: COMPUTER ASSISTED TOTAL KNEE ARTHROPLASTY - RNFA;  Surgeon: Donato Heinz, MD;  Location: [facility];  Service: Orthopedics;  Laterality: Left;   KNEE ARTHROPLASTY Right 10/24/2022   Procedure: COMPUTER ASSISTED TOTAL KNEE ARTHROPLASTY;  Surgeon: Donato Heinz, MD;  Location: [facility];  Service: Orthopedics;  Laterality: Right;   KNEE ARTHROSCOPY Right 08/14/2021   Procedure: ARTHROSCOPY KNEE, medial menisectomy and chondroplasty, loose bodies removal;  Surgeon: Donato Heinz, MD;  Location: [facility];  Service: Orthopedics;  Laterality: Right;   LUMBAR LAMINECTOMY/DECOMPRESSION MICRODISCECTOMY N/A 01/26/2023   Procedure: T12-L1 DECOMPRESSION, L1-2 POSTERIOR SPINAL DECOMPRESSION;  Surgeon: Venetia Night, MD;  Location: [facility];  Service: Neurosurgery;  Laterality: N/A;   MASS EXCISION Left 02/16/2015   Procedure: LEFT BURSA ELBOW EXCISION;  Surgeon: Erin Sons, MD;  Location: [facility];  Service: Orthopedics;  Laterality: Left;   NISSEN FUNDOPLICATION  06/10/1999    NOSE SURGERY  12/01/2002   SEPTUM RECONSTRUCTION   POLYPECTOMY  12/18/2014   Procedure: POLYPECTOMY;  Surgeon: [name], MD;  Location: [facility];  Service: Endoscopy;;   POSTERIOR LUMBAR FUSION 4 WITH HARDWARE REMOVAL N/A 01/26/2023   Procedure: T10-PELVIS POSTERIOR SPINAL FUSION;  Surgeon: Venetia Night, MD;  Location: [facility];  Service: Neurosurgery;  Laterality: N/A;   SEPTOPLASTY  06/09/1997   SHOULDER SURGERY Right 06/09/1981   SHOULDER SURGERY Left 06/09/1977   WITH ELBOW (TENDON RELEASE)   SKIN SURGERY  06/09/2002   SKIN CANCER NOSE   TRANSFORAMINAL LUMBAR INTERBODY FUSION W/ MIS 1 LEVEL N/A 01/26/2023   Procedure: L5-S1 TRANSFORAMINAL LUMBAR INTERBODY FUSION (TLIF);  Surgeon: Venetia Night, MD;  Location: [facility];  Service: Neurosurgery;  Laterality: N/A;    Home Medications:  Allergies as of 07/31/2023       Reactions   Gabapentin Other (See Comments)   Severe shakiness   Lamotrigine Other (See Comments)   falls        Medication List        Accurate as of July 31, 2023  8:50  AM. If you have any questions, ask your nurse or doctor.          amLODipine 10 MG tablet Commonly known as: NORVASC Take 10 mg by mouth every morning.   ARIPiprazole 2 MG tablet Commonly known as: ABILIFY Take 2 mg by mouth every morning.   celecoxib 200 MG capsule Commonly known as: CELEBREX Take 1 capsule (200 mg total) by mouth 2 (two) times daily.   clonazePAM 0.5 MG tablet Commonly known as: KLONOPIN Take 0.5-1 mg by mouth 2 (two) times daily as needed for anxiety.   cyanocobalamin 1000 MCG tablet Commonly known as: VITAMIN B12 Take 2,000 mcg by mouth daily.   DULoxetine 60 MG capsule Commonly known as: CYMBALTA 60 mg every morning.   fluticasone 50 MCG/ACT nasal spray Commonly known as: FLONASE Place 2 sprays into both nostrils as needed for allergies or rhinitis.   furosemide 40 MG tablet Commonly known as: LASIX Take 40 mg by  mouth daily as needed for fluid.   ipratropium 0.03 % nasal spray Commonly known as: ATROVENT Place 2 sprays into both nostrils 2 (two) times daily.   losartan 25 MG tablet Commonly known as: COZAAR Take 25 mg by mouth daily.   Lumify 0.025 % Soln Generic drug: Brimonidine Tartrate Place 1 drop into both eyes 2 (two) times daily as needed (redness).   multivitamin with minerals Tabs tablet Take 1 tablet by mouth daily.   NEEDLE (DISP) 23 G 23G X 1" Misc Use 1 Units every 10 (ten) days.   senna 8.6 MG Tabs tablet Commonly known as: SENOKOT Take 1 tablet (8.6 mg total) by mouth daily as needed for mild constipation.   sodium fluoride 1.1 % Gel dental gel Commonly known as: FLUORISHIELD Place 1 application onto teeth daily.   spironolactone 25 MG tablet Commonly known as: ALDACTONE Take 25 mg by mouth every morning.   Tadalafil 2.5 MG Tabs Take by mouth.   tamsulosin 0.4 MG Caps capsule Commonly known as: FLOMAX Take by mouth. What changed: Another medication with the same name was added. Make sure you understand how and when to take each.   tamsulosin 0.4 MG Caps capsule Commonly known as: FLOMAX Take 2 capsules (0.8 mg total) by mouth daily. What changed: You were already taking a medication with the same name, and this prescription was added. Make sure you understand how and when to take each.   testosterone cypionate 200 MG/ML injection Commonly known as: DEPOTESTOSTERONE CYPIONATE ADMINISTER 0.5 ML IN THE MUSCLE 1 TIME A WEEK   traZODone 100 MG tablet Commonly known as: DESYREL Take 100 mg by mouth at bedtime.   VITAMIN D-3 PO Take 1 tablet by mouth daily at 6 (six) AM.        Allergies:  Allergies  Allergen Reactions   Gabapentin Other (See Comments)    Severe shakiness   Lamotrigine Other (See Comments)    falls    Family History: Family History  Problem Relation Age of Onset   Arthritis Mother    Asthma Mother    Cancer Mother    Heart  disease Father     Social History:  reports that he has never smoked. He has never used smokeless tobacco. He reports current alcohol use of about 1.0 standard drink of alcohol per week. He reports that he does not use drugs.   Physical Exam: BP 139/84   Pulse 83   Ht 5\' 8"  (1.727 m)   Wt 225 lb (102.1 kg)  BMI 34.21 kg/m   Constitutional:  Alert and oriented, No acute distress. HEENT: [place] AT Respiratory: Normal respiratory effort, no increased work of breathing. Psychiatric: Normal mood and affect.   Assessment & Plan:    1. BPH with urinary retention PVR today with 542 mL.  Recommended Foley catheter placement with a follow-up visit in 7-10 days for catheter removal/voiding trial Titrate tamsulosin to 0.4 mg twice daily  2. Hypogonadism. Testosterone level elevated, however drawn just after an injection  Lab visit 6 months testosterone, PSA Office visit 1 year Testosterone refilled  3. Prostate cancer screening Last PSA was December 2023; PSA was not drawn with his most recent labs.  Add PSA test to recent lab draw to ensure up-to-date screening.   I have reviewed the above documentation for accuracy and completeness, and I agree with the above.   [name], MD  [facility] [address] [place], [place] [address] [phone]

## 2023-07-31 NOTE — Progress Notes (Signed)
 Simple Catheter Placement  Due to urinary retention patient is present today for a foley cath placement.  Patient was cleaned and prepped in a sterile fashion with betadine and 2% lidocaine jelly was instilled into the urethra. A 16 FR coude foley catheter was inserted, urine return was noted  700 ml, urine was light yellow in color.  The balloon was filled with 10cc of sterile water.  A leg bag was attached for drainage. Patient was also given a night bag to take home and was given instruction on how to change from one bag to another.  Patient was given instruction on proper catheter care.  Patient tolerated well, no complications were noted   Performed by: Georgean Spainhower H rma  Additional notes/ Follow up: come back for voiding trial 1 week.

## 2023-08-01 LAB — PSA: Prostate Specific Ag, Serum: 1.1 ng/mL (ref 0.0–4.0)

## 2023-08-01 LAB — SPECIMEN STATUS REPORT

## 2023-08-02 ENCOUNTER — Encounter: Payer: Self-pay | Admitting: Urology

## 2023-08-06 ENCOUNTER — Encounter: Payer: Self-pay | Admitting: Urology

## 2023-08-06 ENCOUNTER — Ambulatory Visit: Payer: HMO | Admitting: Urology

## 2023-08-06 ENCOUNTER — Ambulatory Visit: Payer: HMO | Admitting: Physician Assistant

## 2023-08-06 VITALS — BP 138/92 | HR 73 | Ht 68.0 in | Wt 225.0 lb

## 2023-08-06 DIAGNOSIS — R338 Other retention of urine: Secondary | ICD-10-CM

## 2023-08-06 DIAGNOSIS — R339 Retention of urine, unspecified: Secondary | ICD-10-CM

## 2023-08-06 DIAGNOSIS — N401 Enlarged prostate with lower urinary tract symptoms: Secondary | ICD-10-CM

## 2023-08-06 LAB — BLADDER SCAN AMB NON-IMAGING
Scan Result: 295
Scan Result: 540

## 2023-08-06 MED ORDER — SULFAMETHOXAZOLE-TRIMETHOPRIM 800-160 MG PO TABS: 1.0000 | ORAL_TABLET | Freq: Two times a day (BID) | ORAL | 0 refills | Status: AC

## 2023-08-06 NOTE — Progress Notes (Signed)
 Catheter Removal  Patient is present today for a catheter removal.  10ml of water was drained from the balloon. A foley cath was removed from the bladder, no complications were noted. Patient tolerated well.  Performed by: S.Jeremaih Klima, CMA  Follow up/ Additional notes: No follow-ups on file.

## 2023-08-06 NOTE — Progress Notes (Signed)
 08/06/2023 3:44 PM   [name] February 14, 1951 [id number]  CC: Chief Complaint  Patient presents with   Follow-up   HPI: [name] is a 73 y.o. male with PMH BPH with incomplete bladder emptying on Flomax 0.8 mg s/p UroLift with Dr. Lonna Cobb in 2022 and hypogonadism on TRT with IM testosterone cypionate who presents today for voiding trial after found to have urinary retention at his annual follow-up with Dr. Lonna Cobb last week.   Foley catheter removed in the morning, see separate procedure note for details.  Today he reports he increased his Flomax to 0.8 mg daily as instructed and has been tolerating it well without orthostasis.  He urinated several times today without straining or dysuria.  He admits to bothersome urinary frequency/double voiding over the past several months and wonders if his UroLift could be optimized.  PSA has been stable, 1.1 8 days ago.  Bladder scan on arrival with PVR ; PVR previously 542 mL.  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   Bilateral sciatica    Chronic back pain    a.) s/p multiple back surgeries   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   Cirrhosis (HCC)    DDD (degenerative disc disease), cervical 11/06/2014   DDD (degenerative disc disease), lumbar 11/06/2014   Depression    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    face, numerous lesions removed    Esophagitis, reflux 12/07/2014   GERD (gastroesophageal reflux disease)    a.) s/p Nissen fundoplication   Hepatitis C virus infection cured after antiviral drug therapy    a.) s/p Tx with ledipasvir/sofosbuvir   History of kidney stones    Idiopathic scoliosis and kyphoscoliosis 03/01/2013   Leukopenia    Neuropathy of both feet (severe)    Painful legs and moving toes    DUE TO BACK PROBLEMS   Prostatitis    Sacroiliac joint dysfunction 11/06/2014   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; b.) 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; c.) followed by hematology --> requires therpeutic phlebotomy for Hct > 50   Sleep apnea    a.) no nocturnal PAP therapy   Spinal stenosis 12/07/2014   Testicular dysfunction 11/04/2011   a.) on exogenous TRT (depotestosterone cypionate)    Surgical History: Past Surgical History:  Procedure Laterality Date   ANTERIOR LAT LUMBAR FUSION N/A 01/26/2023   Procedure: L2-3 LATERAL LUMBAR INTERBODY FUSION;  Surgeon: Venetia Night, MD;  Location: [facility];  Service: Neurosurgery;  Laterality: N/A;   APPLICATION OF INTRAOPERATIVE CT SCAN N/A 01/26/2023   Procedure: APPLICATION OF INTRAOPERATIVE CT SCAN;  Surgeon: Venetia Night, MD;  Location: [facility];  Service: Neurosurgery;  Laterality: N/A;   BACK SURGERY  04/14/2013   L2-S1   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   CATARACT EXTRACTION Left 06/10/1987   CERVICAL SPINE SURGERY      X2 FUSION C4-7   CHOLECYSTECTOMY  07/10/2013   COLONOSCOPY WITH PROPOFOL N/A 12/18/2014   Procedure: COLONOSCOPY WITH PROPOFOL;  Surgeon: [name], MD;  Location: [facility];  Service: Endoscopy;  Laterality: N/A;   COLONOSCOPY WITH PROPOFOL N/A 09/23/2021   Procedure: COLONOSCOPY WITH PROPOFOL;  Surgeon: [name], MD;  Location: [facility];  Service: Endoscopy;  Laterality: N/A;   CYSTOSCOPY WITH INSERTION OF UROLIFT N/A 04/02/2021    Procedure: CYSTOSCOPY WITH INSERTION OF UROLIFT;  Surgeon: [name], MD;  Location: [facility];  Service: Urology;  Laterality: N/A;   ELBOW ARTHROSCOPY Right 02/08/2012   ELBOW SURGERY Left    ESOPHAGOGASTRODUODENOSCOPY (EGD) WITH PROPOFOL N/A 12/18/2014   Procedure: ESOPHAGOGASTRODUODENOSCOPY (EGD) WITH PROPOFOL with dialtion;  Surgeon: [name], MD;  Location: [facility];  Service: Endoscopy;  Laterality: N/A;   ESOPHAGOGASTRODUODENOSCOPY (EGD) WITH PROPOFOL N/A 09/23/2021   Procedure: ESOPHAGOGASTRODUODENOSCOPY (EGD) WITH PROPOFOL;  Surgeon: [name], MD;  Location: [facility];  Service: Endoscopy;  Laterality: N/A;   FACIAL RECONSTRUCTION SURGERY  06/09/1968   BOTTLE CUT NOSE AND EYE, MULTIPLE SURGERIES   FINGER SURGERY Left 06/09/2013   THUMB   FINGER SURGERY Right 06/09/1964   MIDDLE   FOOT SURGERY Right 06/09/1982   FUNDIPLICATION  06/10/1999   HERNIA REPAIR Left 1981/1957   RIGHT SIDE IN 1957   JOINT REPLACEMENT Left 2021   knee   KNEE ARTHROPLASTY Left 02/10/2020   Procedure: COMPUTER ASSISTED TOTAL KNEE ARTHROPLASTY - RNFA;  Surgeon: Donato Heinz, MD;  Location: [facility];  Service: Orthopedics;  Laterality: Left;   KNEE ARTHROPLASTY Right 10/24/2022   Procedure: COMPUTER ASSISTED TOTAL KNEE ARTHROPLASTY;  Surgeon: Donato Heinz, MD;  Location: [facility];  Service: Orthopedics;  Laterality: Right;   KNEE ARTHROSCOPY Right 08/14/2021   Procedure: ARTHROSCOPY KNEE, medial menisectomy and chondroplasty, loose bodies removal;  Surgeon: Donato Heinz, MD;  Location: [facility];  Service: Orthopedics;  Laterality: Right;   LUMBAR LAMINECTOMY/DECOMPRESSION MICRODISCECTOMY N/A 01/26/2023   Procedure: T12-L1 DECOMPRESSION, L1-2 POSTERIOR SPINAL DECOMPRESSION;  Surgeon: Venetia Night, MD;  Location: [facility];  Service: Neurosurgery;  Laterality: N/A;   MASS EXCISION Left 02/16/2015   Procedure: LEFT BURSA ELBOW EXCISION;  Surgeon: Erin Sons, MD;   Location: [facility];  Service: Orthopedics;  Laterality: Left;   NISSEN FUNDOPLICATION  06/10/1999   NOSE SURGERY  12/01/2002   SEPTUM RECONSTRUCTION   POLYPECTOMY  12/18/2014   Procedure: POLYPECTOMY;  Surgeon: [name], MD;  Location: [facility];  Service: Endoscopy;;   POSTERIOR LUMBAR FUSION 4 WITH HARDWARE REMOVAL N/A 01/26/2023   Procedure: T10-PELVIS POSTERIOR SPINAL FUSION;  Surgeon: Venetia Night, MD;  Location: [facility];  Service: Neurosurgery;  Laterality: N/A;   SEPTOPLASTY  06/09/1997   SHOULDER SURGERY Right 06/09/1981   SHOULDER SURGERY Left 06/09/1977   WITH ELBOW (TENDON RELEASE)   SKIN SURGERY  06/09/2002   SKIN CANCER NOSE   TRANSFORAMINAL LUMBAR INTERBODY FUSION W/ MIS 1 LEVEL N/A 01/26/2023   Procedure: L5-S1 TRANSFORAMINAL LUMBAR INTERBODY FUSION (TLIF);  Surgeon: Venetia Night, MD;  Location: [facility];  Service: Neurosurgery;  Laterality: N/A;    Home Medications:  Allergies as of 08/06/2023       Reactions   Bupropion Other (See Comments)   Worsened depression   Gabapentin Other (See Comments)   Severe shakiness   Lamotrigine Other (See Comments)  falls        Medication List        Accurate as of August 06, 2023  3:44 PM. If you have any questions, ask your nurse or doctor.          STOP taking these medications    cyanocobalamin 1000 MCG tablet Commonly known as: VITAMIN B12 Stopped by: SHANNON MCGOWAN       TAKE these medications    amLODipine 10 MG tablet Commonly known as: NORVASC Take 10 mg by mouth every morning.   ARIPiprazole 2 MG tablet Commonly known as: ABILIFY Take 2 mg by mouth every morning.   celecoxib 200 MG capsule Commonly known as: CELEBREX Take 1 capsule (200 mg total) by mouth 2 (two) times daily.   clonazePAM 0.5 MG tablet Commonly known as: KLONOPIN Take 0.5-1 mg by mouth 2 (two) times daily as needed for anxiety.   DULoxetine 60 MG capsule Commonly known as:  CYMBALTA 60 mg every morning.   fluticasone 50 MCG/ACT nasal spray Commonly known as: FLONASE Place 2 sprays into both nostrils as needed for allergies or rhinitis.   furosemide 40 MG tablet Commonly known as: LASIX Take 40 mg by mouth daily as needed for fluid.   ipratropium 0.03 % nasal spray Commonly known as: ATROVENT Place 2 sprays into both nostrils 2 (two) times daily.   losartan 25 MG tablet Commonly known as: COZAAR Take 25 mg by mouth daily.   Lumify 0.025 % Soln Generic drug: Brimonidine Tartrate Place 1 drop into both eyes 2 (two) times daily as needed (redness).   multivitamin with minerals Tabs tablet Take 1 tablet by mouth daily.   NEEDLE (DISP) 23 G 23G X 1" Misc Use 1 Units every 10 (ten) days.   senna 8.6 MG Tabs tablet Commonly known as: SENOKOT Take 1 tablet (8.6 mg total) by mouth daily as needed for mild constipation.   sodium fluoride 1.1 % Gel dental gel Commonly known as: FLUORISHIELD Place 1 application onto teeth daily.   spironolactone 25 MG tablet Commonly known as: ALDACTONE Take 25 mg by mouth every morning.   Tadalafil 2.5 MG Tabs Take by mouth.   tamsulosin 0.4 MG Caps capsule Commonly known as: FLOMAX Take 2 capsules (0.8 mg total) by mouth daily. What changed: Another medication with the same name was removed. Continue taking this medication, and follow the directions you see here. Changed by: Michiel Cowboy   testosterone cypionate 200 MG/ML injection Commonly known as: DEPOTESTOSTERONE CYPIONATE ADMINISTER 0.5 ML IN THE MUSCLE 1 TIME A WEEK   traZODone 100 MG tablet Commonly known as: DESYREL Take 100 mg by mouth at bedtime.   VITAMIN D-3 PO Take 1 tablet by mouth daily at 6 (six) AM.        Allergies:  Allergies  Allergen Reactions   Bupropion Other (See Comments)    Worsened depression   Gabapentin Other (See Comments)    Severe shakiness   Lamotrigine Other (See Comments)    falls    Family  History: Family History  Problem Relation Age of Onset   Arthritis Mother    Asthma Mother    Cancer Mother    Heart disease Father     Social History:   reports that he has never smoked. He has never used smokeless tobacco. He reports current alcohol use of about 1.0 standard drink of alcohol per week. He reports that he does not use drugs.  Physical Exam: There were no vitals taken  for this visit.  Constitutional:  Alert and oriented, no acute distress, nontoxic appearing HEENT: [place], AT Cardiovascular: No clubbing, cyanosis, or edema Respiratory: Normal respiratory effort, no increased work of breathing Skin: No rashes, bruises or suspicious lesions Neurologic: Grossly intact, no focal deficits, moving all 4 extremities Psychiatric: Normal mood and affect  Laboratory Data: Results for orders placed or performed in visit on 08/06/23  BLADDER SCAN AMB NON-IMAGING   Collection Time: 08/06/23  3:39 PM  Result Value Ref Range   Scan Result 540 ml    Scan Result 295 ml    Assessment & Plan:   1. Benign prostatic hyperplasia with urinary retention (Primary) Incomplete bladder emptying today, however not in overt retention.  He is tolerating increased Flomax without orthostasis.  No indication for catheter replacement at this time.  We discussed various options including surveillance versus cystoscopy with consideration of additional UroLift implants versus TURP versus HoLEP.  Given his reports of recently bothersome frequency/double voiding, he would like to pursue cystoscopy, which is reasonable.  I am prescribing 3 days of empiric Bactrim DS twice daily for UTI prevention given incomplete bladder emptying and recent Foley catheterization. - BLADDER SCAN AMB NON-IMAGING - sulfamethoxazole-trimethoprim (BACTRIM DS) 800-160 MG tablet; Take 1 tablet by mouth 2 (two) times daily for 3 days.  Dispense: 6 tablet; Refill: 0   Return in about 2 weeks (around 08/20/2023) for Cysto with Dr.  Lonna Cobb.  Carman Ching, PA-C  [facility] [place] [address] [place], [place] [address] [phone]

## 2023-08-11 ENCOUNTER — Inpatient Hospital Stay: Payer: HMO | Attending: Internal Medicine | Admitting: Internal Medicine

## 2023-08-11 ENCOUNTER — Encounter: Payer: Self-pay | Admitting: Internal Medicine

## 2023-08-11 ENCOUNTER — Inpatient Hospital Stay: Payer: HMO

## 2023-08-11 VITALS — BP 136/87 | HR 84 | Resp 18

## 2023-08-11 DIAGNOSIS — D751 Secondary polycythemia: Secondary | ICD-10-CM | POA: Insufficient documentation

## 2023-08-11 DIAGNOSIS — Z85820 Personal history of malignant melanoma of skin: Secondary | ICD-10-CM | POA: Diagnosis not present

## 2023-08-11 DIAGNOSIS — R5383 Other fatigue: Secondary | ICD-10-CM | POA: Diagnosis not present

## 2023-08-11 DIAGNOSIS — G8929 Other chronic pain: Secondary | ICD-10-CM | POA: Insufficient documentation

## 2023-08-11 DIAGNOSIS — E291 Testicular hypofunction: Secondary | ICD-10-CM | POA: Insufficient documentation

## 2023-08-11 DIAGNOSIS — M549 Dorsalgia, unspecified: Secondary | ICD-10-CM | POA: Diagnosis not present

## 2023-08-11 LAB — HEMOGLOBIN AND HEMATOCRIT (CANCER CENTER ONLY)
HCT: 51.2 % (ref 39.0–52.0)
Hemoglobin: 17.1 g/dL — ABNORMAL HIGH (ref 13.0–17.0)

## 2023-08-11 NOTE — Assessment & Plan Note (Addendum)
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   # Today the hematocrit is 51.4 Recommend phlebotomy if HCT > 50. Patient denies any improvement of his symptoms post phlebotomy. Proceed with phlebotomy today.   # Ortho: right knee pain/ Back pain-s/p surgery improved. stable  # Hypogonadism- on testosterone [Dr.Stoioff]-stable.  Recommend continuing testosterone given his history of hypogonadism.  # DISPOSITION: # proceed with Phlebotomy #  H&H q 3 M- x4/possible phlebotomy; # Follow up  In 12 month with MD- cbc/possible phlebotomy;- Dr.B

## 2023-08-11 NOTE — Progress Notes (Signed)
 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   # chronic back pain s/p surgery; hypogonadism [Dr.Stoiff]  INTERVAL HISTORY: Alone. Ambulating independently.    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  Patient is here for F/U erythrocytosis. Did not need phlebotomy on last visit. Takes testosterone once a week. Chronic back pain, zaps his energy. Appetite is norma   Chronic mild fatigue.  Denies any worsening shortness of breath or cough. No headaches.  Review of Systems  Constitutional:  Positive for malaise/fatigue. Negative for chills, diaphoresis, fever and weight loss.  HENT:  Negative for nosebleeds and sore throat.   Eyes:  Negative for double vision.  Respiratory:  Negative for cough, hemoptysis, sputum production, shortness of breath and wheezing.   Cardiovascular:  Negative for chest pain, palpitations, orthopnea and leg swelling.  Gastrointestinal:  Negative for abdominal pain, blood in stool, constipation, diarrhea, heartburn, melena, nausea and vomiting.  Genitourinary:  Negative for dysuria, frequency and urgency.  Musculoskeletal:  Positive for back pain and joint pain.  Skin:  Negative for itching.  Neurological:  Negative for dizziness, tingling, focal weakness, weakness and headaches.  Endo/Heme/Allergies:  Does not bruise/bleed easily.  Psychiatric/Behavioral:  Negative for depression. The patient is not nervous/anxious and does not have insomnia.      PAST MEDICAL HISTORY :  Past Medical History:  Diagnosis Date   Actinic keratosis    Anxiety    a.) on BZO PRN (clonazepam)   Arthritis     Basal cell carcinoma 10/09/2016   L sup med scapula    Basal cell carcinoma 07/11/2019   L lat forehead above lat brow    Basal cell carcinoma 08/29/2020   R prox medial pretibial, EDC   Basal cell carcinoma 08/29/2020   R posterior ear, EDC   BCC (basal cell carcinoma of skin) 12/27/2020   R epigastric - ED&C   Benign essential HTN 12/07/2014   Last Assessment & Plan:  Relevant Hx: Course: Daily Update: Today's Plan: CONTROLLED ON MEDS   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/31/2013   Bilateral sciatica    Chronic back pain    a.) s/p multiple back surgeries   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   Cirrhosis (HCC)    DDD (degenerative disc disease), cervical 11/06/2014   DDD (degenerative disc disease), lumbar 11/06/2014   Depression    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   face, numerous lesions removed    Esophagitis, reflux 12/07/2014   GERD (gastroesophageal reflux disease)    a.) s/p Nissen fundoplication   Hepatitis C virus infection cured after antiviral drug therapy    a.) s/p Tx with ledipasvir/sofosbuvir   History of kidney stones    Idiopathic scoliosis and kyphoscoliosis 03/01/2013   Leukopenia    Neuropathy of both feet (severe)    Painful legs and moving toes    DUE TO BACK PROBLEMS   Prostatitis    Sacroiliac joint dysfunction 11/06/2014   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; b.) 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; c.) followed by hematology --> requires therpeutic phlebotomy for Hct > 50   Sleep apnea    a.) no  nocturnal PAP therapy   Spinal stenosis 12/07/2014   Testicular dysfunction 11/04/2011   a.) on exogenous TRT (depotestosterone cypionate)    PAST SURGICAL HISTORY :   Past Surgical History:  Procedure Laterality Date   ANTERIOR LAT LUMBAR FUSION N/A 01/26/2023   Procedure: L2-3 LATERAL LUMBAR INTERBODY FUSION;  Surgeon: Venetia Night, MD;  Location: [facility];  Service:  Neurosurgery;  Laterality: N/A;   APPLICATION OF INTRAOPERATIVE CT SCAN N/A 01/26/2023   Procedure: APPLICATION OF INTRAOPERATIVE CT SCAN;  Surgeon: Venetia Night, MD;  Location: [facility];  Service: Neurosurgery;  Laterality: N/A;   BACK SURGERY  04/14/2013   L2-S1   Cancer of right ear     3/17   CATARACT EXTRACTION Left 06/10/1987   CERVICAL SPINE SURGERY      X2 FUSION C4-7   CHOLECYSTECTOMY  07/10/2013   COLONOSCOPY WITH PROPOFOL N/A 12/18/2014   Procedure: COLONOSCOPY WITH PROPOFOL;  Surgeon: [name], MD;  Location: [facility];  Service: Endoscopy;  Laterality: N/A;   COLONOSCOPY WITH PROPOFOL N/A 09/23/2021   Procedure: COLONOSCOPY WITH PROPOFOL;  Surgeon: [name], MD;  Location: [facility];  Service: Endoscopy;  Laterality: N/A;   CYSTOSCOPY WITH INSERTION OF UROLIFT N/A 04/02/2021   Procedure: CYSTOSCOPY WITH INSERTION OF UROLIFT;  Surgeon: [name], MD;  Location: [facility];  Service: Urology;  Laterality: N/A;   ELBOW ARTHROSCOPY Right 02/08/2012   ELBOW SURGERY Left    ESOPHAGOGASTRODUODENOSCOPY (EGD) WITH PROPOFOL N/A 12/18/2014   Procedure: ESOPHAGOGASTRODUODENOSCOPY (EGD) WITH PROPOFOL with dialtion;  Surgeon: [name], MD;  Location: [facility];  Service: Endoscopy;  Laterality: N/A;   ESOPHAGOGASTRODUODENOSCOPY (EGD) WITH PROPOFOL N/A 09/23/2021   Procedure: ESOPHAGOGASTRODUODENOSCOPY (EGD) WITH PROPOFOL;  Surgeon: [name], MD;  Location: [facility];  Service: Endoscopy;  Laterality: N/A;   FACIAL RECONSTRUCTION SURGERY  06/09/1968   BOTTLE CUT NOSE AND EYE, MULTIPLE SURGERIES   FINGER SURGERY Left 06/09/2013   THUMB   FINGER SURGERY Right 06/09/1964   MIDDLE   FOOT SURGERY Right 06/09/1982   FUNDIPLICATION  06/10/1999   HERNIA REPAIR Left 1981/1957   RIGHT SIDE IN 1957   JOINT REPLACEMENT Left 2021   knee   KNEE ARTHROPLASTY Left 02/10/2020   Procedure: COMPUTER ASSISTED TOTAL KNEE ARTHROPLASTY -  RNFA;  Surgeon: Donato Heinz, MD;  Location: [facility];  Service: Orthopedics;  Laterality: Left;   KNEE ARTHROPLASTY Right 10/24/2022   Procedure: COMPUTER ASSISTED TOTAL KNEE ARTHROPLASTY;  Surgeon: Donato Heinz, MD;  Location: [facility];  Service: Orthopedics;  Laterality: Right;   KNEE ARTHROSCOPY Right 08/14/2021   Procedure: ARTHROSCOPY KNEE, medial menisectomy and chondroplasty, loose bodies removal;  Surgeon: Donato Heinz, MD;  Location: [facility];  Service: Orthopedics;  Laterality: Right;   LUMBAR LAMINECTOMY/DECOMPRESSION MICRODISCECTOMY N/A 01/26/2023   Procedure: T12-L1 DECOMPRESSION, L1-2 POSTERIOR SPINAL DECOMPRESSION;  Surgeon: Venetia Night, MD;  Location: [facility];  Service: Neurosurgery;  Laterality: N/A;   MASS EXCISION Left 02/16/2015   Procedure: LEFT BURSA ELBOW EXCISION;  Surgeon: Erin Sons, MD;  Location: [facility];  Service: Orthopedics;  Laterality: Left;   NISSEN FUNDOPLICATION  06/10/1999   NOSE SURGERY  12/01/2002   SEPTUM RECONSTRUCTION   POLYPECTOMY  12/18/2014   Procedure: POLYPECTOMY;  Surgeon: [name], MD;  Location: [facility];  Service: Endoscopy;;   POSTERIOR LUMBAR FUSION 4 WITH HARDWARE REMOVAL N/A 01/26/2023   Procedure: T10-PELVIS POSTERIOR SPINAL FUSION;  Surgeon: Venetia Night, MD;  Location: [facility];  Service: Neurosurgery;  Laterality: N/A;   SEPTOPLASTY  06/09/1997   SHOULDER SURGERY Right 06/09/1981   SHOULDER SURGERY Left 06/09/1977   WITH ELBOW (TENDON RELEASE)   SKIN SURGERY  06/09/2002   SKIN CANCER NOSE   TRANSFORAMINAL LUMBAR INTERBODY FUSION W/ MIS 1 LEVEL N/A 01/26/2023   Procedure: L5-S1 TRANSFORAMINAL LUMBAR INTERBODY FUSION (TLIF);  Surgeon: Venetia Night, MD;  Location: [facility];  Service: Neurosurgery;  Laterality: N/A;    FAMILY HISTORY :   Family History  Problem Relation Age of Onset   Arthritis Mother    Asthma Mother    Cancer Mother    Heart disease Father     SOCIAL  HISTORY:   Social History   Tobacco Use   Smoking status: Never   Smokeless tobacco: Never  Vaping Use   Vaping status: Never Used  Substance Use Topics   Alcohol use: Yes    Alcohol/week: 1.0 standard drink of alcohol    Types: 1 Glasses of wine per week    Comment: 1-2 drinks every night   Drug use: No    ALLERGIES:  is allergic to bupropion, gabapentin, and lamotrigine.  MEDICATIONS:  Current Outpatient Medications  Medication Sig Dispense Refill   amLODipine (NORVASC) 10 MG tablet Take 10 mg by mouth every morning.     ARIPiprazole (ABILIFY) 2 MG tablet Take 2 mg by mouth every morning.     Brimonidine Tartrate (LUMIFY) 0.025 % SOLN Place 1 drop into both eyes 2 (two) times daily as needed (redness).     celecoxib (CELEBREX) 200 MG capsule Take 1 capsule (200 mg total) by mouth 2 (two) times daily. 60 capsule 1   Cholecalciferol (VITAMIN D-3 PO) Take 1 tablet by mouth daily at 6 (six) AM.     clonazePAM (KLONOPIN) 0.5 MG tablet Take 0.5-1 mg by mouth 2 (two) times daily as needed for anxiety.     DULoxetine (CYMBALTA) 60 MG capsule 60 mg every morning.     fluticasone (FLONASE) 50 MCG/ACT nasal spray Place 2 sprays into both nostrils as needed for allergies or rhinitis.     furosemide (LASIX) 40 MG tablet Take 40 mg by mouth daily as needed for fluid.      ipratropium (ATROVENT) 0.03 % nasal spray Place 2 sprays into both nostrils 2 (two) times daily.     losartan (COZAAR) 25 MG tablet Take 25 mg by mouth daily.     Multiple Vitamin (MULTIVITAMIN WITH MINERALS) TABS tablet Take 1 tablet by mouth daily.     NEEDLE, DISP, 23 G 23G X 1" MISC Use 1 Units every 10 (ten) days.     senna (SENOKOT) 8.6 MG TABS tablet Take 1 tablet (8.6 mg total) by mouth daily as needed for mild constipation. 30 tablet 0   sodium fluoride (FLUORISHIELD) 1.1 % GEL dental gel Place 1 application onto teeth daily.     spironolactone (ALDACTONE) 25 MG tablet Take 25 mg by mouth every morning.      Tadalafil 2.5 MG TABS Take by mouth.     tamsulosin (FLOMAX) 0.4 MG CAPS capsule Take 2 capsules (0.8 mg total) by mouth daily. 60 capsule 2   testosterone cypionate (DEPOTESTOSTERONE CYPIONATE) 200 MG/ML injection ADMINISTER 0.5 ML IN THE MUSCLE 1 TIME A WEEK 10 mL 0   traZODone (DESYREL) 100 MG tablet Take 100 mg by mouth at bedtime.     No current facility-administered medications for this visit.    PHYSICAL EXAMINATION:   BP 126/85 (BP  Location: Right Leg, Patient Position: Sitting)   Pulse 90   Resp 18   Wt 229 lb 9.6 oz (104.1 kg)   BMI 34.91 kg/m   Filed Weights   08/11/23 1316  Weight: 229 lb 9.6 oz (104.1 kg)      Physical Exam HENT:     Head: Normocephalic and atraumatic.     Mouth/Throat:     Pharynx: No oropharyngeal exudate.  Eyes:     Pupils: Pupils are equal, round, and reactive to light.  Cardiovascular:     Rate and Rhythm: Normal rate and regular rhythm.  Pulmonary:     Effort: No respiratory distress.     Breath sounds: No wheezing.  Abdominal:     General: Bowel sounds are normal. There is no distension.     Palpations: Abdomen is soft. There is no mass.     Tenderness: There is no abdominal tenderness. There is no guarding or rebound.  Musculoskeletal:        General: No tenderness. Normal range of motion.     Cervical back: Normal range of motion and neck supple.  Skin:    General: Skin is warm.     Comments: Erythematous rash noted on the face/skin ointment for precancerous skin lesions.  Neurological:     Mental Status: He is alert and oriented to person, place, and time.  Psychiatric:        Mood and Affect: Affect normal.     LABORATORY DATA:  I have reviewed the data as listed    Component Value Date/Time   NA 135 01/29/2023 0231   NA 137 02/22/2012 0645   K 3.6 01/29/2023 0231   K 4.6 02/22/2012 0645   CL 105 01/29/2023 0231   CL 103 02/22/2012 0645   CO2 28 01/29/2023 0231   CO2 30 02/22/2012 0645   GLUCOSE 125 (H)  01/29/2023 0231   GLUCOSE 90 02/22/2012 0645   BUN 11 01/29/2023 0231   BUN 9 02/22/2012 0645   CREATININE 0.72 01/29/2023 0231   CREATININE 0.97 02/22/2012 0645   CALCIUM 7.9 (L) 01/29/2023 0231   CALCIUM 8.5 02/22/2012 0645   PROT 7.7 10/17/2022 1229   PROT 6.9 02/22/2012 0645   ALBUMIN 4.4 10/17/2022 1229   ALBUMIN 3.1 (L) 02/22/2012 0645   AST 48 (H) 10/17/2022 1229   AST 80 (H) 02/22/2012 0645   ALT 70 (H) 10/17/2022 1229   ALT 96 (H) 02/22/2012 0645   ALKPHOS 54 10/17/2022 1229   ALKPHOS 47 (L) 02/22/2012 0645   BILITOT 1.2 10/17/2022 1229   BILITOT 0.3 02/22/2012 0645   GFRNONAA >60 01/29/2023 0231   GFRNONAA >60 02/22/2012 0645   GFRAA >60 02/02/2020 1202   GFRAA >60 02/22/2012 0645    No results found for: "SPEP", "UPEP"  Lab Results  Component Value Date   WBC 6.4 01/29/2023   NEUTROABS 2.8 04/18/2016   HGB 17.1 (H) 08/11/2023   HCT 51.2 08/11/2023   MCV 93.5 01/29/2023   PLT 124 (L) 01/29/2023      Chemistry      Component Value Date/Time   NA 135 01/29/2023 0231   NA 137 02/22/2012 0645   K 3.6 01/29/2023 0231   K 4.6 02/22/2012 0645   CL 105 01/29/2023 0231   CL 103 02/22/2012 0645   CO2 28 01/29/2023 0231   CO2 30 02/22/2012 0645   BUN 11 01/29/2023 0231   BUN 9 02/22/2012 0645   CREATININE 0.72 01/29/2023 0231  CREATININE 0.97 02/22/2012 0645      Component Value Date/Time   CALCIUM 7.9 (L) 01/29/2023 0231   CALCIUM 8.5 02/22/2012 0645   ALKPHOS 54 10/17/2022 1229   ALKPHOS 47 (L) 02/22/2012 0645   AST 48 (H) 10/17/2022 1229   AST 80 (H) 02/22/2012 0645   ALT 70 (H) 10/17/2022 1229   ALT 96 (H) 02/22/2012 0645   BILITOT 1.2 10/17/2022 1229   BILITOT 0.3 02/22/2012 0645        ASSESSMENT & PLAN:   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   # Today the hematocrit is 51.4 Recommend phlebotomy if HCT > 50. Patient denies any improvement of his symptoms post phlebotomy. Proceed with phlebotomy today.    # Ortho: right knee pain/ Back pain-s/p surgery improved. stable  # Hypogonadism- on testosterone [Dr.Stoioff]-stable.  Recommend continuing testosterone given his history of hypogonadism.  # DISPOSITION: # proceed with Phlebotomy #  H&H q 3 M- x4/possible phlebotomy; # Follow up  In 12 month with MD- cbc/possible phlebotomy;- Dr.B      Earna Coder, MD 08/11/2023 1:43 PM

## 2023-08-11 NOTE — Progress Notes (Signed)
 Pt here for F/U erythrocytosis. Did not need phlebotomy on last visit. Takes testosterone once a week. Chronic back pain, zaps his energy.  Neuropathy is constant. Appetite is normal.

## 2023-08-11 NOTE — Addendum Note (Signed)
 Addended by: Darrold Span A on: 08/11/2023 02:01 PM   Modules accepted: Orders

## 2023-08-26 ENCOUNTER — Other Ambulatory Visit: Payer: Self-pay | Admitting: Family Medicine

## 2023-08-26 DIAGNOSIS — M51369 Other intervertebral disc degeneration, lumbar region without mention of lumbar back pain or lower extremity pain: Secondary | ICD-10-CM

## 2023-09-01 ENCOUNTER — Ambulatory Visit: Admitting: Neurosurgery

## 2023-09-01 ENCOUNTER — Encounter: Payer: Self-pay | Admitting: Neurosurgery

## 2023-09-01 ENCOUNTER — Ambulatory Visit
Admission: RE | Admit: 2023-09-01 | Discharge: 2023-09-01 | Disposition: A | Discharge status: Home / Self Care | Source: Ambulatory Visit | Attending: Neurosurgery

## 2023-09-01 ENCOUNTER — Ambulatory Visit
Admission: RE | Admit: 2023-09-01 | Discharge: 2023-09-01 | Disposition: A | Discharge status: Home / Self Care | Attending: Neurosurgery | Admitting: Neurosurgery

## 2023-09-01 VITALS — BP 110/74 | Ht 68.0 in | Wt 229.0 lb

## 2023-09-01 DIAGNOSIS — M4316 Spondylolisthesis, lumbar region: Secondary | ICD-10-CM | POA: Diagnosis not present

## 2023-09-01 DIAGNOSIS — R2689 Other abnormalities of gait and mobility: Secondary | ICD-10-CM | POA: Diagnosis not present

## 2023-09-01 DIAGNOSIS — R531 Weakness: Secondary | ICD-10-CM

## 2023-09-01 DIAGNOSIS — M960 Pseudarthrosis after fusion or arthrodesis: Secondary | ICD-10-CM

## 2023-09-01 DIAGNOSIS — M48061 Spinal stenosis, lumbar region without neurogenic claudication: Secondary | ICD-10-CM | POA: Diagnosis not present

## 2023-09-01 DIAGNOSIS — Z981 Arthrodesis status: Secondary | ICD-10-CM

## 2023-09-01 DIAGNOSIS — M545 Low back pain, unspecified: Secondary | ICD-10-CM | POA: Diagnosis not present

## 2023-09-01 DIAGNOSIS — M51369 Other intervertebral disc degeneration, lumbar region without mention of lumbar back pain or lower extremity pain: Secondary | ICD-10-CM | POA: Diagnosis not present

## 2023-09-01 NOTE — Progress Notes (Signed)
   REFERRING PHYSICIAN:  [name], Np [address] [place],  [place] [address]  DOS: 01/26/23 L2-3 XLIF, L3-4 TLIF, T12 and L1 and L1-2 decompression, T10 - pelvis PSF  HISTORY OF PRESENT ILLNESS:  [name] presents today about 7 months s/p thoracic decompression and fusion.  I am seeing him today prior to his originally scheduled 28-month follow-up to have spasms into his lower back and buttock bilaterally.  He denies any radiating symptoms.  He states this is worse with ambulating for more than 5 minutes causing him to have to sit down and is significantly impacting his quality of life.   04/16/23 [name]  He was doing well at  his last visit with Dr. Myer Haff.   He's having some intermittent spasms in his hips/buttocks when he walks. It goes away after he stops to sit. No leg pain. He is walking more than he was prior to surgery. He still feels much better then he did prior to surgery.   He continues on celebrex. Has robaxin, but it does not help.    PHYSICAL EXAMINATION:  General: Patient is well developed, well nourished, calm, collected, and in no apparent distress.   NEUROLOGICAL:  General: In no acute distress.   Awake, alert, oriented to person, place, and time.  Pupils equal round and reactive to light.  Facial tone is symmetric.    Strength:            Side Iliopsoas Quads Hamstring PF DF EHL  R 5 5 5 5 5 5   L 5 5 5 5 5 5   2  beats of clonus right lower extremity.  ROS (Neurologic):  Negative except as noted above  IMAGING: Lumbar xrays dated 09/01/23 Without any obvious signs of complication   ASSESSMENT/PLAN:  [name] is doing fair s/p above surgery.  He has had progressively more frequent spasms into his buttock, hips and legs since surgery that are now affecting his ability to walk.  While his strength is in his lower extremities he does have an element of hyperreflexia.  I recommended further evaluation with MRIs of his thoracic and  lumbar spine as well as a CT scan of his lumbar spine and scoliosis x-rays. I will review these studies once completed and set up [name] for a telephone visit to discuss next steps if appropriate once completed.  I offered him a referral to physical therapy however he states he has been continuing the exercises he was given postoperatively and would prefer to avoid therapy at this time.  He was encouraged to call the office in the interim with any questions or concerns.  I spent a total of 32 minutes in both face-to-face and non-face-to-face activities for this visit on the date of this encounter including review of records, discussion of symptoms, discussion of differential diagnosis, documentation, and order placement.   Manning Charity PA-C Department of neurosurgery

## 2023-09-07 ENCOUNTER — Other Ambulatory Visit: Payer: HMO | Admitting: Urology

## 2023-09-10 ENCOUNTER — Ambulatory Visit
Admission: RE | Admit: 2023-09-10 | Discharge: 2023-09-10 | Disposition: A | Discharge status: Home / Self Care | Source: Ambulatory Visit | Attending: Neurosurgery

## 2023-09-10 ENCOUNTER — Ambulatory Visit
Admission: RE | Admit: 2023-09-10 | Discharge: 2023-09-10 | Disposition: A | Discharge status: Home / Self Care | Source: Ambulatory Visit | Attending: Neurosurgery | Admitting: Neurosurgery

## 2023-09-10 ENCOUNTER — Other Ambulatory Visit: Payer: Self-pay | Admitting: Neurosurgery

## 2023-09-10 DIAGNOSIS — Z472 Encounter for removal of internal fixation device: Secondary | ICD-10-CM | POA: Diagnosis not present

## 2023-09-10 DIAGNOSIS — M960 Pseudarthrosis after fusion or arthrodesis: Secondary | ICD-10-CM | POA: Diagnosis not present

## 2023-09-10 DIAGNOSIS — R531 Weakness: Secondary | ICD-10-CM | POA: Diagnosis not present

## 2023-09-10 DIAGNOSIS — Z4789 Encounter for other orthopedic aftercare: Secondary | ICD-10-CM | POA: Diagnosis not present

## 2023-09-10 DIAGNOSIS — Z981 Arthrodesis status: Secondary | ICD-10-CM

## 2023-09-10 DIAGNOSIS — M51369 Other intervertebral disc degeneration, lumbar region without mention of lumbar back pain or lower extremity pain: Secondary | ICD-10-CM | POA: Diagnosis not present

## 2023-09-10 DIAGNOSIS — M5134 Other intervertebral disc degeneration, thoracic region: Secondary | ICD-10-CM | POA: Diagnosis not present

## 2023-09-10 DIAGNOSIS — M47814 Spondylosis without myelopathy or radiculopathy, thoracic region: Secondary | ICD-10-CM | POA: Diagnosis not present

## 2023-09-10 DIAGNOSIS — M48061 Spinal stenosis, lumbar region without neurogenic claudication: Secondary | ICD-10-CM | POA: Diagnosis not present

## 2023-09-10 DIAGNOSIS — S3992XA Unspecified injury of lower back, initial encounter: Secondary | ICD-10-CM | POA: Diagnosis not present

## 2023-09-10 DIAGNOSIS — M47816 Spondylosis without myelopathy or radiculopathy, lumbar region: Secondary | ICD-10-CM | POA: Diagnosis not present

## 2023-09-10 DIAGNOSIS — M419 Scoliosis, unspecified: Secondary | ICD-10-CM | POA: Diagnosis not present

## 2023-10-08 ENCOUNTER — Ambulatory Visit: Admitting: Neurosurgery

## 2023-10-08 DIAGNOSIS — M4807 Spinal stenosis, lumbosacral region: Secondary | ICD-10-CM | POA: Diagnosis not present

## 2023-10-08 DIAGNOSIS — M4726 Other spondylosis with radiculopathy, lumbar region: Secondary | ICD-10-CM | POA: Diagnosis not present

## 2023-10-08 DIAGNOSIS — M5416 Radiculopathy, lumbar region: Secondary | ICD-10-CM

## 2023-10-08 DIAGNOSIS — Z981 Arthrodesis status: Secondary | ICD-10-CM | POA: Diagnosis not present

## 2023-10-08 DIAGNOSIS — M47816 Spondylosis without myelopathy or radiculopathy, lumbar region: Secondary | ICD-10-CM

## 2023-10-08 NOTE — Progress Notes (Signed)
 Neurosurgery Telephone (Audio-Only) Note  Requesting Provider     Efraim Grange, NP [address] [place],  [place] [address] T: [phone] F: [phone]  Primary Care Provider Efraim Grange, NP [address] [place] [address] T: [phone] F: [phone]  Telehealth visit was conducted with [name], a 73 y.o. male via telephone.  History of Present Illness: [name] is a 73 y.o presenting today via telephone visit to review his recent CT and MRI results as well as discussion with Dr. Mont Antis. Today he reports ongoing low back and bilateral buttock pain that is unchanged.  He is still having trouble with walking for more than about 20 yards due to pain and feeling like his legs are going to give out.  09/01/23 [name] presents today about 7 months s/p thoracic decompression and fusion.  I am seeing him today prior to his originally scheduled 45-month follow-up to have spasms into his lower back and buttock bilaterally.  He denies any radiating symptoms.  He states this is worse with ambulating for more than 5 minutes causing him to have to sit down and is significantly impacting his quality of life.     04/16/23 [name]  He was doing well at  his last visit with Dr. Mont Antis.    He's having some intermittent spasms in his hips/buttocks when he walks. It goes away after he stops to sit. No leg pain. He is walking more than he was prior to surgery. He still feels much better then he did prior to surgery.    He continues on celebrex . Has robaxin , but it does not help  General Review of Systems:  A ROS was performed including pertinent positive and negatives as documented.  All other systems are negative.   Prior to Admission medications   Medication Sig Start Date End Date Taking? Authorizing Provider  amLODipine  (NORVASC ) 10 MG tablet Take 10 mg by mouth every morning. 07/12/18   [provider]  ARIPiprazole  (ABILIFY ) 2 MG  tablet Take 2 mg by mouth every morning.    [provider]  Brimonidine  Tartrate (LUMIFY ) 0.025 % SOLN Place 1 drop into both eyes 2 (two) times daily as needed (redness).    [provider]  celecoxib  (CELEBREX ) 200 MG capsule Take 1 capsule (200 mg total) by mouth 2 (two) times daily. 10/25/22   Bert Britain, PA-C  Cholecalciferol (VITAMIN D-3 PO) Take 1 tablet by mouth daily at 6 (six) AM.    [provider]  clonazePAM  (KLONOPIN ) 0.5 MG tablet Take 0.5-1 mg by mouth 2 (two) times daily as needed for anxiety. 12/18/16   [provider]  DULoxetine  (CYMBALTA ) 60 MG capsule 60 mg every morning. 12/12/20   [provider]  fluticasone  (FLONASE ) 50 MCG/ACT nasal spray Place 2 sprays into both nostrils as needed for allergies or rhinitis.    [provider]  furosemide  (LASIX ) 40 MG tablet Take 40 mg by mouth daily as needed for fluid.     [provider]  ipratropium (ATROVENT ) 0.03 % nasal spray Place 2 sprays into both nostrils 2 (two) times daily.    [provider]  losartan  (COZAAR ) 25 MG tablet Take 25 mg by mouth daily. 08/22/22   [provider]  Multiple Vitamin (MULTIVITAMIN WITH MINERALS) TABS tablet Take 1 tablet by mouth daily.    [provider]  NEEDLE, DISP, 23 G 23G X 1" MISC Use 1 Units every 10 (ten) days.  07/05/14   [provider]  senna (SENOKOT) 8.6 MG TABS tablet Take 1 tablet (8.6 mg total) by mouth daily as needed for mild constipation. 01/30/23   Noble Bateman, PA  sodium fluoride  (FLUORISHIELD) 1.1 % GEL dental gel Place 1 application onto teeth daily. 02/14/20   [provider]  spironolactone  (ALDACTONE ) 25 MG tablet Take 25 mg by mouth every morning.    [provider]  Tadalafil 2.5 MG TABS Take by mouth. 07/02/23   [provider]  tamsulosin  (FLOMAX ) 0.4 MG CAPS capsule Take 2 capsules (0.8 mg total) by mouth daily. 07/31/23   Stoioff, Kizzie Perks, MD   testosterone  cypionate (DEPOTESTOSTERONE CYPIONATE) 200 MG/ML injection ADMINISTER 0.5 ML IN THE MUSCLE 1 TIME A WEEK 07/31/23   Stoioff, Kizzie Perks, MD  traZODone  (DESYREL ) 100 MG tablet Take 100 mg by mouth at bedtime.    [provider]    DATA REVIEWED    Imaging Studies  CT L spine 09/10/23 IMPRESSION: 1. Osteopenia, scoliosis, extensive postsurgical and degenerative change without evidence of acute fracture or hardware complication. 2. Postoperative changes T10-S1 with S2 sacroiliac bolts in addition. 3. Interval disc arthrodesis at L2-3 and L5-S1 since June 2024. 4. Chronic loosening of the L3 and S1 pedicle screws. 5. Removed left-sided L4 pedicle screw, which previously partially traversed the left lateral recess. 6. Multilevel foraminal stenosis due to spondylosis and facet spurring. 7. Aortic atherosclerosis. 8. Level-specific detailed findings discussed above   Aortic Atherosclerosis (ICD10-I70.0).     Electronically Signed   By: Denman Fischer M.D.   On: 09/25/2023 02:21   MRI L spine 09/10/23 IMPRESSION: 1. Operative changes of posterior decompression fusion from T9 through S1. Interbody fusion at L2-L3 and L5-S1. Fluid collection at the laminectomy site at L3-L4, likely postoperative seroma. 2. Mild canal stenoses at several levels in the thoracic spine secondary to disc bulging and facet arthropathy. Mild foraminal stenoses at several levels in the thoracic spine secondary to disc bulging and facet arthropathy. 3. No high-grade canal stenosis in the lumbar spine. Moderate foraminal stenoses bilaterally at L5-S1.     Electronically Signed   By: Johnanna Mylar M.D.   On: 10/05/2023 13:29  MRI T spine 09/10/23 IMPRESSION: 1. Operative changes of posterior decompression fusion from T9 through S1. Interbody fusion at L2-L3 and L5-S1. Fluid collection at the laminectomy site at L3-L4, likely postoperative seroma. 2. Mild canal stenoses at several levels in  the thoracic spine secondary to disc bulging and facet arthropathy. Mild foraminal stenoses at several levels in the thoracic spine secondary to disc bulging and facet arthropathy. 3. No high-grade canal stenosis in the lumbar spine. Moderate foraminal stenoses bilaterally at L5-S1.     Electronically Signed   By: Johnanna Mylar M.D.   On: 10/05/2023 13:29  IMPRESSION  [name] is a 73 y.o. male who I performed a telephone encounter today for evaluation and management of subacute on chronic low back and bilateral buttock pain  PLAN  I reviewed his MRI and CT results as well as my discussion with Dr. Mont Antis.  Unfortunately due to the extent of his fusion is hard to evaluate the significant of the noted foraminal stenosis at L5-S1 however this could be why he is symptomatic.  We discussed the utility of a CT myelogram and he would like to move forward with this.  I will place an order for the study.  I will review the study results and discuss options for plan of  care with Dr. Jeris Montes once completed.  He was encouraged to call our office in the interim.  We will keep his currently scheduled postop appointment for next week.  No orders of the defined types were placed in this encounter.  DISPOSITION  Follow up: In person appointment in  as schedule with Dr. Mont Antis on 10/15/23  Noble Bateman, PA   TELEPHONE DOCUMENTATION   This visit was performed via telephone.  Patient location: home Provider location: office  I spent a total of 5 minutes non-face-to-face activities for this visit on the date of this encounter including review of current clinical condition and response to treatment.  The patient is aware of and accepts the limits of this telehealth visit.

## 2023-10-14 ENCOUNTER — Other Ambulatory Visit: Payer: Self-pay

## 2023-10-14 DIAGNOSIS — M47816 Spondylosis without myelopathy or radiculopathy, lumbar region: Secondary | ICD-10-CM

## 2023-10-15 ENCOUNTER — Ambulatory Visit
Admission: RE | Admit: 2023-10-15 | Discharge: 2023-10-15 | Disposition: A | Discharge status: Home / Self Care | Source: Ambulatory Visit | Attending: Neurosurgery | Admitting: Neurosurgery

## 2023-10-15 ENCOUNTER — Ambulatory Visit: Payer: HMO | Admitting: Neurosurgery

## 2023-10-15 ENCOUNTER — Encounter: Payer: Self-pay | Admitting: Neurosurgery

## 2023-10-15 ENCOUNTER — Ambulatory Visit
Admission: RE | Admit: 2023-10-15 | Discharge: 2023-10-15 | Disposition: A | Discharge status: Home / Self Care | Attending: Neurosurgery | Admitting: Neurosurgery

## 2023-10-15 VITALS — BP 132/88 | Ht 68.0 in | Wt 229.0 lb

## 2023-10-15 DIAGNOSIS — M47816 Spondylosis without myelopathy or radiculopathy, lumbar region: Secondary | ICD-10-CM

## 2023-10-15 DIAGNOSIS — Z981 Arthrodesis status: Secondary | ICD-10-CM

## 2023-10-15 DIAGNOSIS — M960 Pseudarthrosis after fusion or arthrodesis: Secondary | ICD-10-CM | POA: Diagnosis not present

## 2023-10-15 DIAGNOSIS — M545 Low back pain, unspecified: Secondary | ICD-10-CM | POA: Diagnosis not present

## 2023-10-15 DIAGNOSIS — M4316 Spondylolisthesis, lumbar region: Secondary | ICD-10-CM | POA: Diagnosis not present

## 2023-10-15 NOTE — Progress Notes (Signed)
   REFERRING PHYSICIAN:  Efraim Grange, [address] [place],  [place] [address]  DOS: 01/26/23 L2-3 XLIF, L3-4 TLIF, T12 and L1 and L1-2 decompression, T10 - pelvis PSF  HISTORY OF PRESENT ILLNESS: MAIKA TIM is here in follow-up after his thoracolumbar fusion.  He is doing well from a back pain standpoint.   He continues to have some cramping and spasms in his legs.  He is having some pain in his buttocks.  He has trouble walking long distances.   PHYSICAL EXAMINATION:  General: Patient is well developed, well nourished, calm, collected, and in no apparent distress.   NEUROLOGICAL:  General: In no acute distress.   Awake, alert, oriented to person, place, and time.  Pupils equal round and reactive to light.  Facial tone is symmetric.    Strength:            Side Iliopsoas Quads Hamstring PF DF EHL  R 5 5 5 5 5 5   L 5 5 5 5 5 5      ROS (Neurologic):  Negative except as noted above  IMAGING: No complications noted  ASSESSMENT/PLAN:  REVAN DERRICKS is doing fair s/p above surgery.  There is some artifact on his MRI scan.  To help fully evaluate this, we are getting a CT myelogram to see if he is a candidate for any injections.  If he is not, we will discuss referral for spinal cord stimulation.  Jodeen Munch MD Department of neurosurgery

## 2023-10-16 NOTE — Progress Notes (Signed)
 Patient for DG Lumbar Myelogram/ CT Lumbar Myelogram on Mon 10/19/23, I called and spoke with the patient on the phone and gave pre-procedure instructions. Pt was made aware to be here at 9:30a and check in at the new entrance. Pt stated understanding.  Called 10/16/23

## 2023-10-19 ENCOUNTER — Ambulatory Visit
Admission: RE | Admit: 2023-10-19 | Discharge: 2023-10-19 | Disposition: A | Discharge status: Home / Self Care | Source: Ambulatory Visit | Attending: Neurosurgery | Admitting: Neurosurgery

## 2023-10-19 DIAGNOSIS — Z981 Arthrodesis status: Secondary | ICD-10-CM | POA: Diagnosis not present

## 2023-10-19 DIAGNOSIS — M545 Low back pain, unspecified: Secondary | ICD-10-CM | POA: Diagnosis not present

## 2023-10-19 DIAGNOSIS — M4186 Other forms of scoliosis, lumbar region: Secondary | ICD-10-CM | POA: Diagnosis not present

## 2023-10-19 DIAGNOSIS — M5416 Radiculopathy, lumbar region: Secondary | ICD-10-CM | POA: Insufficient documentation

## 2023-10-19 DIAGNOSIS — M4316 Spondylolisthesis, lumbar region: Secondary | ICD-10-CM | POA: Diagnosis not present

## 2023-10-19 DIAGNOSIS — M47816 Spondylosis without myelopathy or radiculopathy, lumbar region: Secondary | ICD-10-CM | POA: Diagnosis not present

## 2023-10-19 DIAGNOSIS — M5126 Other intervertebral disc displacement, lumbar region: Secondary | ICD-10-CM | POA: Diagnosis not present

## 2023-10-19 DIAGNOSIS — M48061 Spinal stenosis, lumbar region without neurogenic claudication: Secondary | ICD-10-CM | POA: Diagnosis not present

## 2023-10-19 MED ORDER — ACETAMINOPHEN 500 MG PO TABS
500.0000 mg | ORAL_TABLET | Freq: Four times a day (QID) | ORAL | Status: DC | PRN
  Filled 2023-10-19: qty 1

## 2023-10-19 MED ORDER — IOHEXOL 180 MG/ML  SOLN
20.0000 mL | Freq: Once | INTRAMUSCULAR | Status: AC | PRN
  Administered 2023-10-19: 20 mL

## 2023-10-19 MED ORDER — LIDOCAINE 1 % OPTIME INJ - NO CHARGE
5.0000 mL | Freq: Once | INTRAMUSCULAR | Status: AC
  Administered 2023-10-19: 4 mL
  Filled 2023-10-19: qty 6

## 2023-10-19 NOTE — Procedures (Signed)
 PROCEDURE SUMMARY:  Successful fluoroscopic guided lumbar myelogram at the level of L3-4.  20 mL of Omnipaque 180 contrast were injected under intermittent fluoroscopy.  No immediate complications.  Pt tolerated well.   EBL = none  Please see full dictation in imaging section of Epic for procedure details.   Electronically Signed: Antwuan Eckley M Kristan Votta, PA-C 10/19/2023, 11:26 AM

## 2023-11-10 ENCOUNTER — Encounter: Payer: Self-pay | Admitting: Dermatology

## 2023-11-10 ENCOUNTER — Ambulatory Visit: Admitting: Dermatology

## 2023-11-10 DIAGNOSIS — Z1283 Encounter for screening for malignant neoplasm of skin: Secondary | ICD-10-CM | POA: Diagnosis not present

## 2023-11-10 DIAGNOSIS — Z85828 Personal history of other malignant neoplasm of skin: Secondary | ICD-10-CM | POA: Diagnosis not present

## 2023-11-10 DIAGNOSIS — Z8589 Personal history of malignant neoplasm of other organs and systems: Secondary | ICD-10-CM

## 2023-11-10 DIAGNOSIS — L821 Other seborrheic keratosis: Secondary | ICD-10-CM | POA: Diagnosis not present

## 2023-11-10 DIAGNOSIS — L820 Inflamed seborrheic keratosis: Secondary | ICD-10-CM

## 2023-11-10 DIAGNOSIS — D1801 Hemangioma of skin and subcutaneous tissue: Secondary | ICD-10-CM | POA: Diagnosis not present

## 2023-11-10 DIAGNOSIS — Z872 Personal history of diseases of the skin and subcutaneous tissue: Secondary | ICD-10-CM | POA: Diagnosis not present

## 2023-11-10 DIAGNOSIS — L814 Other melanin hyperpigmentation: Secondary | ICD-10-CM | POA: Diagnosis not present

## 2023-11-10 DIAGNOSIS — L578 Other skin changes due to chronic exposure to nonionizing radiation: Secondary | ICD-10-CM

## 2023-11-10 DIAGNOSIS — L570 Actinic keratosis: Secondary | ICD-10-CM

## 2023-11-10 DIAGNOSIS — W908XXA Exposure to other nonionizing radiation, initial encounter: Secondary | ICD-10-CM | POA: Diagnosis not present

## 2023-11-10 DIAGNOSIS — D229 Melanocytic nevi, unspecified: Secondary | ICD-10-CM

## 2023-11-10 NOTE — Progress Notes (Signed)
 Follow-Up Visit   Subjective  [name] is a 73 y.o. male who presents for the following: Skin Cancer Screening and Full Body Skin Exam Hx of aks, hx of isks,  hx of bcc, hx of scc  Rough spots at face, itchy rough spots at upper back.  The patient presents for Total-Body Skin Exam (TBSE) for skin cancer screening and mole check. The patient has spots, moles and lesions to be evaluated, some may be new or changing and the patient may have concern these could be cancer.  The following portions of the chart were reviewed this encounter and updated as appropriate: medications, allergies, medical history  Review of Systems:  No other skin or systemic complaints except as noted in HPI or Assessment and Plan.  Objective  Well appearing patient in no apparent distress; mood and affect are within normal limits.  A full examination was performed including scalp, head, eyes, ears, nose, lips, neck, chest, axillae, abdomen, back, buttocks, bilateral upper extremities, bilateral lower extremities, hands, feet, fingers, toes, fingernails, and toenails. All findings within normal limits unless otherwise noted below.   Relevant physical exam findings are noted in the Assessment and Plan.  scalp/face/ears x 15 b/l hands x 10 (25) Erythematous thin papules/macules with gritty scale.  right top of shoulder and back x 3, chest x 1, b/l arms x 18, legs x 9 (31) Erythematous stuck-on, waxy papule or plaque  Assessment & Plan   SKIN CANCER SCREENING PERFORMED TODAY.  ACTINIC DAMAGE - Chronic condition, secondary to cumulative UV/sun exposure - diffuse scaly erythematous macules with underlying dyspigmentation - Recommend daily broad spectrum sunscreen SPF 30+ to sun-exposed areas, reapply every 2 hours as needed.  - Staying in the shade or wearing long sleeves, sun glasses (UVA+UVB protection) and wide brim hats (4-inch brim around the entire circumference of the hat) are also recommended for  sun protection.  - Call for new or changing lesions.  LENTIGINES, SEBORRHEIC KERATOSES, HEMANGIOMAS - Benign normal skin lesions - Benign-appearing - Call for any changes  MELANOCYTIC NEVI - Tan-brown and/or pink-flesh-colored symmetric macules and papules - Benign appearing on exam today - Observation - Call clinic for new or changing moles - Recommend daily use of broad spectrum spf 30+ sunscreen to sun-exposed areas.   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  - Call if any new or changing lesions are noted between office visits  ACTINIC KERATOSIS (25) scalp/face/ears x 15 b/l hands x 10 (25) Patient advised if area at back of scalp is still scabbing after 2 months to call or send mychart  Actinic keratoses are precancerous spots that appear secondary to cumulative UV radiation exposure/sun exposure over time. They are chronic with expected duration  over 1 year. A portion of actinic keratoses will progress to squamous cell carcinoma of the skin. It is not possible to reliably predict which spots will progress to skin cancer and so treatment is recommended to prevent development of skin  cancer.  Recommend daily broad spectrum sunscreen SPF 30+ to sun-exposed areas, reapply every 2 hours as needed.  Recommend staying in the shade or wearing long sleeves, sun glasses (UVA+UVB protection) and wide brim hats (4-inch brim around the entire circumference of the hat). Call for new or changing lesions. Destruction of lesion - scalp/face/ears x 15 b/l hands x 10 (25) Complexity: simple   Destruction method: cryotherapy   Informed consent: discussed and consent obtained   Timeout:  patient name, date of birth, surgical site, and procedure verified Lesion destroyed using liquid nitrogen: Yes   Region frozen until ice ball extended beyond lesion: Yes   Outcome: patient tolerated procedure well with no complications   Post-procedure details: wound care instructions given   INFLAMED SEBORRHEIC KERATOSIS (31) right top of shoulder and back x 3, chest x 1, b/l arms x 18, legs x 9 (31) Symptomatic, irritating, patient would like treated. Destruction of lesion - right top of shoulder and back x 3, chest x 1, b/l arms x 18, legs x 9 (31) Complexity: simple   Destruction method: cryotherapy   Informed consent: discussed and consent obtained   Timeout:  patient name, date of birth, surgical site, and procedure verified Lesion destroyed using liquid nitrogen: Yes   Region frozen until ice ball extended beyond lesion: Yes   Outcome: patient tolerated procedure well with no complications   Post-procedure details: wound care instructions given   Return in about 1 year (around 11/09/2024) for TBSE.  IRandee Busing, CMA, am acting as scribe for Celine Collard, MD.   Documentation: I have reviewed the above documentation for accuracy and completeness, and I agree with the above.  Celine Collard, MD

## 2023-11-10 NOTE — Patient Instructions (Addendum)
 If spot at back of scalp is still scabbing after 2 months give office a call or send mychart message we will bring back to retreat   Actinic keratoses are precancerous spots that appear secondary to cumulative UV radiation exposure/sun exposure over time. They are chronic with expected duration over 1 year. A portion of actinic keratoses will progress to squamous cell carcinoma of the skin. It is not possible to reliably predict which spots will progress to skin cancer and so treatment is recommended to prevent development of skin cancer.  Recommend daily broad spectrum sunscreen SPF 30+ to sun-exposed areas, reapply every 2 hours as needed.  Recommend staying in the shade or wearing long sleeves, sun glasses (UVA+UVB protection) and wide brim hats (4-inch brim around the entire circumference of the hat). Call for new or changing lesions.   Cryotherapy Aftercare  Wash gently with soap and water  everyday.   Apply Vaseline and Band-Aid daily until healed.     Seborrheic Keratosis  What causes seborrheic keratoses? Seborrheic keratoses are harmless, common skin growths that first appear during adult life.  As time goes by, more growths appear.  Some people may develop a large number of them.  Seborrheic keratoses appear on both covered and uncovered body parts.  They are not caused by sunlight.  The tendency to develop seborrheic keratoses can be inherited.  They vary in color from skin-colored to gray, brown, or even black.  They can be either smooth or have a rough, warty surface.   Seborrheic keratoses are superficial and look as if they were stuck on the skin.  Under the microscope this type of keratosis looks like layers upon layers of skin.  That is why at times the top layer may seem to fall off, but the rest of the growth remains and re-grows.    Treatment Seborrheic keratoses do not need to be treated, but can easily be removed in the office.  Seborrheic keratoses often cause symptoms  when they rub on clothing or jewelry.  Lesions can be in the way of shaving.  If they become inflamed, they can cause itching, soreness, or burning.  Removal of a seborrheic keratosis can be accomplished by freezing, burning, or surgery. If any spot bleeds, scabs, or grows rapidly, please return to have it checked, as these can be an indication of a skin cancer.    Melanoma ABCDEs  Melanoma is the most dangerous type of skin cancer, and is the leading cause of death from skin disease.  You are more likely to develop melanoma if you: Have light-colored skin, light-colored eyes, or red or blond hair Spend a lot of time in the sun Tan regularly, either outdoors or in a tanning bed Have had blistering sunburns, especially during childhood Have a close family member who has had a melanoma Have atypical moles or large birthmarks  Early detection of melanoma is key since treatment is typically straightforward and cure rates are extremely high if we catch it early.   The first sign of melanoma is often a change in a mole or a new dark spot.  The ABCDE system is a way of remembering the signs of melanoma.  A for asymmetry:  The two halves do not match. B for border:  The edges of the growth are irregular. C for color:  A mixture of colors are present instead of an even brown color. D for diameter:  Melanomas are usually (but not always) greater than 6mm - the size of  a pencil eraser. E for evolution:  The spot keeps changing in size, shape, and color.  Please check your skin once per month between visits. You can use a small mirror in front and a large mirror behind you to keep an eye on the back side or your body.   If you see any new or changing lesions before your next follow-up, please call to schedule a visit.  Please continue daily skin protection including broad spectrum sunscreen SPF 30+ to sun-exposed areas, reapplying every 2 hours as needed when you're outdoors.   Staying in the  shade or wearing long sleeves, sun glasses (UVA+UVB protection) and wide brim hats (4-inch brim around the entire circumference of the hat) are also recommended for sun protection.    Due to recent changes in healthcare laws, you may see results of your pathology and/or laboratory studies on MyChart before the doctors have had a chance to review them. We understand that in some cases there may be results that are confusing or concerning to you. Please understand that not all results are received at the same time and often the doctors may need to interpret multiple results in order to provide you with the best plan of care or course of treatment. Therefore, we ask that you please give us  2 business days to thoroughly review all your results before contacting the office for clarification. Should we see a critical lab result, you will be contacted sooner.   If You Need Anything After Your Visit  If you have any questions or concerns for your doctor, please call our main line at [phone] and press option 4 to reach your doctor's medical assistant. If no one answers, please leave a voicemail as directed and we will return your call as soon as possible. Messages left after 4 pm will be answered the following business day.   You may also send us  a message via MyChart. We typically respond to MyChart messages within 1-2 business days.  For prescription refills, please ask your pharmacy to contact our office. Our fax number is [phone].  If you have an urgent issue when the clinic is closed that cannot wait until the next business day, you can page your doctor at the number below.    Please note that while we do our best to be available for urgent issues outside of office hours, we are not available 24/7.   If you have an urgent issue and are unable to reach us , you may choose to seek medical care at your doctor's office, retail clinic, urgent care center, or emergency room.  If you have a medical  emergency, please immediately call 911 or go to the emergency department.  Pager Numbers  - Dr. Bary Likes: [phone]  - Dr. Annette Barters: [phone]  - Dr. Felipe Horton: [phone]   In the event of inclement weather, please call our main line at [phone] for an update on the status of any delays or closures.  Dermatology Medication Tips: Please keep the boxes that topical medications come in in order to help keep track of the instructions about where and how to use these. Pharmacies typically print the medication instructions only on the boxes and not directly on the medication tubes.   If your medication is too expensive, please contact our office at [phone] option 4 or send us  a message through MyChart.   We are unable to tell what your co-pay for medications will be in advance as this is different depending on your insurance  coverage. However, we may be able to find a substitute medication at lower cost or fill out paperwork to get insurance to cover a needed medication.   If a prior authorization is required to get your medication covered by your insurance company, please allow us  1-2 business days to complete this process.  Drug prices often vary depending on where the prescription is filled and some pharmacies may offer cheaper prices.  The website www.goodrx.com contains coupons for medications through different pharmacies. The prices here do not account for what the cost may be with help from insurance (it may be cheaper with your insurance), but the website can give you the price if you did not use any insurance.  - You can print the associated coupon and take it with your prescription to the pharmacy.  - You may also stop by our office during regular business hours and pick up a GoodRx coupon card.  - If you need your prescription sent electronically to a different pharmacy, notify our office through [facility] or by phone at [phone] option 4.     Si Usted  Necesita Algo Despus de Su Visita  Tambin puede enviarnos un mensaje a travs de Clinical cytogeneticist. Por lo general respondemos a los mensajes de MyChart en el transcurso de 1 a 2 das hbiles.  Para renovar recetas, por favor pida a su farmacia que se ponga en contacto con nuestra oficina. Franz Jacks de fax es Agenda [phone].  Si tiene un asunto urgente cuando la clnica est cerrada y que no puede esperar hasta el siguiente da hbil, puede llamar/localizar a su doctor(a) al nmero que aparece a continuacin.   Por favor, tenga en cuenta que aunque hacemos todo lo posible para estar disponibles para asuntos urgentes fuera del horario de [place], no estamos disponibles las 24 horas del da, los 7 [address]  [address] de la [place].   Si tiene un problema urgente y no puede comunicarse con nosotros, puede optar por buscar atencin mdica  en el consultorio de su doctor(a), en una clnica privada, en un centro de atencin urgente o en una sala de emergencias.  Si tiene Engineer, drilling, por favor llame inmediatamente al 911 o vaya a la sala de emergencias.  Nmeros de bper  - Dr. Bary Likes: [phone]  - Dra. Annette Barters: [phone]  - Dr. Felipe Horton: [phone]   En caso de inclemencias del tiempo, por favor llame a Lajuan Pila principal al [phone] para una actualizacin sobre el [place] de cualquier retraso o cierre.  Consejos para la medicacin en dermatologa: Por favor, guarde las cajas en las que vienen los medicamentos de uso tpico para ayudarle a seguir las instrucciones sobre dnde y cmo usarlos. Las farmacias generalmente imprimen las instrucciones del medicamento slo en las cajas y no directamente en los tubos del [place].   Si su medicamento es muy caro, por favor, pngase en contacto con Bettyjane Brunet llamando al [phone] y presione la opcin 4 o envenos un mensaje a travs de Clinical cytogeneticist.   No podemos decirle cul ser su copago por los medicamentos por adelantado ya que esto  es diferente dependiendo de la cobertura de su seguro. Sin embargo, es posible que podamos encontrar un medicamento sustituto a Audiological scientist un formulario para que el seguro cubra el medicamento que se considera necesario.   Si se requiere una autorizacin previa para que su compaa de seguros [place] su medicamento, por favor permtanos de 1 a 2 das hbiles para completar este proceso.  Los  precios de los medicamentos varan con frecuencia dependiendo del lugar de dnde se surte la receta y alguna farmacias pueden ofrecer precios ms baratos.  El sitio web www.goodrx.com tiene cupones para medicamentos de Health and safety inspector. Los precios aqu no tienen en cuenta lo que podra costar con la ayuda del seguro (puede ser ms barato con su seguro), pero el sitio web puede darle el precio si no utiliz Tourist information centre manager.  - Puede imprimir el cupn correspondiente y llevarlo con su receta a la farmacia.  - Tambin puede pasar por nuestra oficina durante el horario de atencin regular y Education officer, museum una tarjeta de cupones de GoodRx.  - Si necesita que su receta se enve electrnicamente a una farmacia diferente, informe a nuestra oficina a travs de MyChart de [place] o por telfono llamando al [phone] y presione la opcin 4.

## 2023-11-11 ENCOUNTER — Inpatient Hospital Stay: Attending: Internal Medicine

## 2023-11-11 ENCOUNTER — Inpatient Hospital Stay

## 2023-11-11 DIAGNOSIS — D751 Secondary polycythemia: Secondary | ICD-10-CM | POA: Insufficient documentation

## 2023-11-11 LAB — HEMOGLOBIN AND HEMATOCRIT (CANCER CENTER ONLY)
HCT: 49.2 % (ref 39.0–52.0)
Hemoglobin: 16.8 g/dL (ref 13.0–17.0)

## 2023-11-11 NOTE — Progress Notes (Signed)
 No phlebotomy needed today Hct 49.2.

## 2023-11-20 ENCOUNTER — Telehealth: Payer: Self-pay | Admitting: Neurosurgery

## 2023-11-20 NOTE — Telephone Encounter (Signed)
-----   Message ----- From: Chantal Comment Sent: 11/19/2023   8:57 AM EDT To: Noble Bateman, PA  His CT results finally came in . His daughter says that he is very anxious for his results. Do you want to see him in the office or send a mychart message. His daughter [name] helps him with his mychart.  Patient's daughter sent a message that her dad is concerned about the loosening of the screws. He is in alot of pain and mostly not able to walk much at all. He would like a call back with is CT results and the next treatment option.   Noble Bateman, PA  [place], [name] They actually look ok to me but I'm waiting on Dr. Mont Antis to review them because he's the one that wanted the ct. [name]   Patient's daughter [name] has been notified that once Dr.Yarbrough reviews results the office will reach out to the patient.

## 2023-11-20 NOTE — Telephone Encounter (Signed)
 Patient has appt with Dr.Yarbrough on 6/19 at 2pm.

## 2023-11-20 NOTE — Telephone Encounter (Signed)
 Patient daughter sent over a new message: Can you let her know that he is concerned about the loosening of the screws. He is in alot of pain and mostly not able to walk much at all.

## 2023-11-26 ENCOUNTER — Other Ambulatory Visit: Payer: Self-pay

## 2023-11-26 ENCOUNTER — Ambulatory Visit: Admitting: Neurosurgery

## 2023-11-26 ENCOUNTER — Encounter: Payer: Self-pay | Admitting: Neurosurgery

## 2023-11-26 VITALS — BP 126/82 | Ht 68.0 in | Wt 229.0 lb

## 2023-11-26 DIAGNOSIS — T84498A Other mechanical complication of other internal orthopedic devices, implants and grafts, initial encounter: Secondary | ICD-10-CM | POA: Diagnosis not present

## 2023-11-26 DIAGNOSIS — M5441 Lumbago with sciatica, right side: Secondary | ICD-10-CM | POA: Diagnosis not present

## 2023-11-26 DIAGNOSIS — M960 Pseudarthrosis after fusion or arthrodesis: Secondary | ICD-10-CM

## 2023-11-26 DIAGNOSIS — G8929 Other chronic pain: Secondary | ICD-10-CM | POA: Diagnosis not present

## 2023-11-26 DIAGNOSIS — M5442 Lumbago with sciatica, left side: Secondary | ICD-10-CM

## 2023-11-26 DIAGNOSIS — T84498S Other mechanical complication of other internal orthopedic devices, implants and grafts, sequela: Secondary | ICD-10-CM

## 2023-11-26 DIAGNOSIS — Z01818 Encounter for other preprocedural examination: Secondary | ICD-10-CM

## 2023-11-26 NOTE — Patient Instructions (Signed)
 Please see below for information in regards to your upcoming surgery:   Planned surgery: Revision of L5-S1 fusion   Surgery date: 12/30/23 at [facility]: [address], [place], [place] [address]) - you will find out your arrival time the business day before your surgery.   Pre-op appointment at [facility] Pre-admit Testing: you will receive a call with a date/time for this appointment. If you are scheduled for an in person appointment, Pre-admit Testing is located on the first floor of the [facility] building, [address], [address]. During this appointment, they will advise you which medications you can take the morning of surgery, and which medications you will need to hold for surgery. Labs (such as blood work, EKG) may be done at your pre-op appointment. You are not required to fast for these labs. Should you need to change your pre-op appointment, please call Pre-admit testing at [phone].     Surgical clearance: we will send a clearance form to Jorge Au, NP. They may wish to see you in their office prior to signing the clearance form. If so, they may call you to schedule an appointment.      NSAIDS (Non-steroidal anti-inflammatory drugs): because you are having a fusion, please avoid taking any NSAIDS (examples: ibuprofen, motrin, aleve, naproxen, meloxicam, diclofenac) for 3 months after surgery. Celebrex  is an exception and is OK to take, if prescribed. Tylenol  is not an NSAID.    Common restrictions after surgery: No bending, lifting, or twisting ("BLT"). Avoid lifting objects heavier than 10 pounds for the first 6 weeks after surgery. Where possible, avoid household activities that involve lifting, bending, reaching, pushing, or pulling such as laundry, vacuuming, grocery shopping, and childcare. Try to arrange for help from friends and family for these activities while you heal. Do not drive while taking  prescription pain medication. Weeks 6 through 12 after surgery: avoid lifting more than 25 pounds.     X-rays after surgery: Because you are having a fusion: for appointments after your 2 week follow-up: please arrive at the [facility] outpatient imaging center ([address], [place]) or [facility] one hour prior to your appointment for x-rays. This applies to every appointment after your 2 week follow-up. Failure to do so may result in your appointment being rescheduled.    How to contact us :  If you have any questions/concerns before or after surgery, you can reach us  at [phone], or you can send a mychart message. We can be reached by phone or mychart 8am-4pm, Monday-Friday.  *Please note: Calls after 4pm are forwarded to a third party answering service. Mychart messages are not routinely monitored during evenings, weekends, and holidays. Please call our office to contact the answering service for urgent concerns during non-business hours.    If you have FMLA/disability paperwork, please drop it off or fax it to [phone], attention Pat[place].   Appointments/FMLA & disability paperwork: Gerlean Kocher, & Maryann Smalls Registered Nurses/Surgery schedulers: Jeral Zick & Lauren Medical Assistants: Donnajean Fuse Physician Assistants: Ludwig Safer, PA-C, Anastacio Karvonen, PA-C & [name], PA-C Surgeons: Jodeen Munch, MD & Henderson Lock, MD   [facility] PREADMIT TESTING VISIT and SURGERY INFORMATION SHEET   Now that surgery has been scheduled you can anticipate several phone calls from [facility] services. A pharmacy technician will call you to verify your current list of medications taken at home.  The Pre-Service Center will call to verify your insurance information and to give you billing estimates and information.             The Preadmit Testing Office will be calling to schedule a visit to obtain information  for the anesthesia team and provide instructions on preparation for surgery.  What can you expect for the Preadmit Testing Visit: Appointments may be scheduled in-person or by telephone.  If a telephone visit is scheduled, you may be asked to come into the office to have lab tests or other studies performed.   This visit will not be completed any greater than 14 days prior to your surgery.  If your surgery has been scheduled for a future date, please do not be alarmed if we have not contacted you to schedule an appointment more than a month prior to the surgery date.    Please be prepared to provide the following information during this appointment:            -Personal medical history                                               -Medication and allergy list            -Any history of problems with anesthesia              -Recent lab work or diagnostic studies            -Please notify us  of any needs we should be aware of to provide the best care possible           -You will be provided with instructions on how to prepare for your surgery.    On The Day of Surgery:  You must have a driver to take you home after surgery, you will be asked not to drive for 24 hours following surgery.  Taxi, [place] and non-medical transport will not be acceptable means of transportation unless you have a responsible individual who will be traveling with you.  Visitors in the surgical area:   2 people will be able to visit you in your room once your preparation for surgery has been completed. During surgery, your visitors will be asked to wait in the Surgery Waiting Area.  It is not a requirement for them to stay, if they prefer to leave and come back.  Your visitor(s) will be given an update once the surgery has been completed.  No visitors are allowed in the initial recovery room to respect patient privacy and safety.  Once you are more awake and transfer to the secondary recovery area, or are transferred to an  inpatient room, visitors will again be able to see you.  To respect and protect your privacy: We will ask on the day of surgery who your driver will be and what the contact number for that individual will be. We will ask if it is okay to share information with this individual, or if there is an alternative individual that we, or the surgeon, should contact to provide updates and information. If family or friends come to the surgical information desk requesting information about you, who you have not listed with us , no information will be given.   It may be helpful to designate someone as the main contact who will be responsible for updating your other friends  and family.    PREADMIT TESTING OFFICE: [phone] SAME DAY SURGERY: [phone] We look forward to caring for you before and throughout the process of your surgery.

## 2023-11-26 NOTE — Progress Notes (Signed)
 REFERRING PHYSICIAN:  Efraim Grange, Np [address] [place],  [place] [address]  DOS: 01/26/23 L2-3 XLIF, L3-4 TLIF, T12 and L1 and L1-2 decompression, T10 - pelvis PSF  HISTORY OF PRESENT ILLNESS:  [name] is a 73 year old male with a history of spinal surgery who presents with continued back and leg pain.  He experiences persistent back pain radiating to the buttocks, hip, and down the leg, particularly during physical activities. The pain has worsened over the past month, with associated leg weakness and episodes of giving out, significantly limiting his ability to walk.  He manages pain with Tylenol . Recent imaging indicated screw loosening in his spine. Initially, there was some improvement post-surgery, but his condition has since deteriorated.  He has neuropathy, which may contribute to his leg pain. He has not pursued a spinal cord stimulator evaluation and is not interested in this option.    PHYSICAL EXAMINATION:  General: Patient is well developed, well nourished, calm, collected, and in no apparent distress.   NEUROLOGICAL:  General: In no acute distress.   Awake, alert, oriented to person, place, and time.  Pupils equal round and reactive to light.  Facial tone is symmetric.    Strength:            Side Iliopsoas Quads Hamstring PF DF EHL  R 5 5 5 5 5 5   L 5 5 5 5 5 5      ROS (Neurologic):  Negative except as noted above  IMAGING: CT L spine 10/19/2023 IMPRESSION: 1. Post myelogram CT. Status post spinal fusion hardware placement from T10 through the pelvis. Hardware remains intact. No acute osseous abnormality.   2. Evidence of developing or solid arthrodesis T10-T11 through L4-L5. 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   3. Previous posterior decompression T12-L1 through L5-S1 without residual  spinal stenosis. No significant spinal stenosis T10-T11 through T12-L1.   4. 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 Borderline to mild spinal, severe bilateral T9 neural foraminal stenosis. No lower thoracic spinal cord or conus mass effect.   5.  Aortic Atherosclerosis (ICD10-I70.0).     Electronically Signed   By: Marlise Simpers M.D.   On: 11/18/2023 07:32   ASSESSMENT/PLAN:  [name] is doing worse  s/p above surgery.    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  He has chronic back pain with bilateral sciatica.  There is obvious loosening of his S1 and S2 screws suggesting implant failure.  There is no role for conservative management for this as he has a symptomatic L5-S1 pseudoarthrosis.  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 Adjacent segment disease present but minor. Revision surgery planned to address fusion failure and alleviate pain. BMP considered to aid fusion despite slight cancer risk, acceptable given history. - Schedule revision surgery to remove and upsize S1 and S2 screws. - Use bone morphogenetic protein (BMP) during surgery to aid fusion.  I discussed the planned procedure at length with the patient, including the risks, benefits, alternatives, and indications. The risks discussed include but are not limited to bleeding, infection, need for reoperation, spinal fluid leak, stroke, vision loss, anesthetic complication, coma, paralysis, and even death. I also described in detail that improvement was not guaranteed.  The patient  expressed understanding of these risks, and asked that we proceed with surgery. I described the surgery in layman's terms, and gave ample opportunity for questions, which were answered to the best of my ability.   Jodeen Munch MD Department of neurosurgery

## 2023-12-18 ENCOUNTER — Encounter
Admission: RE | Admit: 2023-12-18 | Discharge: 2023-12-18 | Disposition: A | Discharge status: Home / Self Care | Source: Ambulatory Visit | Attending: Neurosurgery | Admitting: Neurosurgery

## 2023-12-18 ENCOUNTER — Other Ambulatory Visit: Payer: Self-pay

## 2023-12-18 VITALS — BP 132/89 | HR 63 | Temp 97.7°F | Resp 15 | Ht 68.0 in | Wt 233.0 lb

## 2023-12-18 DIAGNOSIS — Z981 Arthrodesis status: Secondary | ICD-10-CM | POA: Diagnosis not present

## 2023-12-18 DIAGNOSIS — I10 Essential (primary) hypertension: Secondary | ICD-10-CM | POA: Insufficient documentation

## 2023-12-18 DIAGNOSIS — Z01818 Encounter for other preprocedural examination: Secondary | ICD-10-CM | POA: Insufficient documentation

## 2023-12-18 DIAGNOSIS — T84498A Other mechanical complication of other internal orthopedic devices, implants and grafts, initial encounter: Secondary | ICD-10-CM | POA: Diagnosis not present

## 2023-12-18 DIAGNOSIS — Z01812 Encounter for preprocedural laboratory examination: Secondary | ICD-10-CM

## 2023-12-18 HISTORY — DX: Scoliosis, unspecified: M41.9

## 2023-12-18 LAB — URINALYSIS, COMPLETE (UACMP) WITH MICROSCOPIC
Bacteria, UA: NONE SEEN
Bilirubin Urine: NEGATIVE
Glucose, UA: NEGATIVE mg/dL
Hgb urine dipstick: NEGATIVE
Ketones, ur: NEGATIVE mg/dL
Leukocytes,Ua: NEGATIVE
Nitrite: NEGATIVE
Protein, ur: NEGATIVE mg/dL
Specific Gravity, Urine: 1.015 (ref 1.005–1.030)
pH: 6 (ref 5.0–8.0)

## 2023-12-18 LAB — BASIC METABOLIC PANEL WITH GFR
Anion gap: 9 (ref 5–15)
BUN: 16 mg/dL (ref 8–23)
CO2: 23 mmol/L (ref 22–32)
Calcium: 9.3 mg/dL (ref 8.9–10.3)
Chloride: 107 mmol/L (ref 98–111)
Creatinine, Ser: 0.72 mg/dL (ref 0.61–1.24)
GFR, Estimated: >60 mL/min (ref >60)
Glucose, Bld: 104 mg/dL — ABNORMAL HIGH (ref 70–99)
Potassium: 4 mmol/L (ref 3.5–5.1)
Sodium: 139 mmol/L (ref 135–145)

## 2023-12-18 LAB — TYPE AND SCREEN
ABO/RH(D): A POS
Antibody Screen: NEGATIVE

## 2023-12-18 LAB — CBC
HCT: 50.9 % (ref 39.0–52.0)
Hemoglobin: 17.3 g/dL — ABNORMAL HIGH (ref 13.0–17.0)
MCH: 31.3 pg (ref 26.0–34.0)
MCHC: 34 g/dL (ref 30.0–36.0)
MCV: 92.2 fL (ref 80.0–100.0)
Platelets: 175 K/uL (ref 150–400)
RBC: 5.52 MIL/uL (ref 4.22–5.81)
RDW: 13.1 % (ref 11.5–15.5)
WBC: 3.2 K/uL — ABNORMAL LOW (ref 4.0–10.5)
nRBC: 0 % (ref 0.0–0.2)

## 2023-12-18 LAB — SURGICAL PCR SCREEN
MRSA, PCR: NEGATIVE
Staphylococcus aureus: NEGATIVE

## 2023-12-18 NOTE — Patient Instructions (Addendum)
 Your procedure is scheduled on: Wednesday, July 23 Report to the Registration Desk on the 1st floor of the [facility] To find out your arrival time, please call [phone] between 1PM - 3PM on: Tuesday, July 22 If your arrival time is 6:00 am, do not arrive before that time as the [facility] entrance doors do not open until 6:00 am.  REMEMBER: Instructions that are not followed completely may result in serious medical risk, up to and including death; or upon the discretion of your surgeon and anesthesiologist your surgery may need to be rescheduled.  Do not eat food after midnight the night before surgery.  No gum chewing or hard candies.  You may however, drink CLEAR liquids up to 2 hours before you are scheduled to arrive for your surgery. Do not drink anything within 2 hours of your scheduled arrival time.  Clear liquids include: - water   - apple juice without pulp - gatorade (not RED colors) - black coffee or tea (Do NOT add milk or creamers to the coffee or tea) Do NOT drink anything that is not on this list.  One week prior to surgery: starting July 16 Stop ANY OVER THE COUNTER supplements until after surgery. Stop multiple vitamins.  You may however, continue to take Tylenol  if needed for pain up until the day of surgery.  Tadalafil - hold 2 days before surgery.   Continue taking all of your other prescription medications up until the day of surgery.  ON THE DAY OF SURGERY ONLY TAKE THESE MEDICATIONS WITH SIPS OF WATER :  amLODipine  (NORVASC )  ARIPiprazole  (ABILIFY )  Brimonidine  Tartrate (LUMIFY ) ClonazePAM  (KLONOPIN ) only if needed for anxiety DULoxetine  (CYMBALTA ) tamsulosin  (FLOMAX )   No Alcohol  for 24 hours before or after surgery.  No Smoking including e-cigarettes for 24 hours before surgery.  No chewable tobacco products for at least 6 hours before surgery.  No nicotine patches on the day of surgery.  Do not use any recreational drugs for at least a  week (preferably 2 weeks) before your surgery.  Please be advised that the combination of cocaine and anesthesia may have negative outcomes, up to and including death. If you test positive for cocaine, your surgery will be cancelled.  On the morning of surgery brush your teeth with toothpaste and water , you may rinse your mouth with mouthwash if you wish. Do not swallow any toothpaste or mouthwash.  Use CHG Soap as directed on instruction sheet.  Do not wear jewelry, make-up, hairpins, clips or nail polish.  For welded (permanent) jewelry: bracelets, anklets, waist bands, etc.  Please have this removed prior to surgery.  If it is not removed, there is a chance that hospital personnel will need to cut it off on the day of surgery.  Do not wear lotions, powders, or perfumes.   Do not shave body hair from the neck down 48 hours before surgery.  Contact lenses, hearing aids and dentures may not be worn into surgery.  Do not bring valuables to the hospital. [facility] is not responsible for any missing/lost belongings or valuables.   Notify your doctor if there is any change in your medical condition (cold, fever, infection).  Wear comfortable clothing (specific to your surgery type) to the hospital.  After surgery, you can help prevent lung complications by doing breathing exercises.  Take deep breaths and cough every 1-2 hours. Your doctor may order a device called an Incentive Spirometer to help you take deep breaths.  If you are  being admitted to the hospital overnight, leave your suitcase in the car. After surgery it may be brought to your room.  In case of increased patient census, it may be necessary for you, the patient, to continue your postoperative care in the Same Day Surgery department.  If you are being discharged the day of surgery, you will not be allowed to drive home. You will need a responsible individual to drive you home and stay with you for 24 hours after surgery.    If you are taking public transportation, you will need to have a responsible individual with you.  Please call the Pre-admissions Testing Dept. at [phone] if you have any questions about these instructions.  Surgery Visitation Policy:  Patients having surgery or a procedure may have two visitors.  Children under the age of 104 must have an adult with them who is not the patient.  Inpatient Visitation:    Visiting hours are 7 a.m. to 8 p.m. Up to four visitors are allowed at one time in a patient room. The visitors may rotate out with other people during the day.  One visitor age 35 or older may stay with the patient overnight and must be in the room by 8 p.m.   Merchandiser, retail to address health-related social needs:  [email or web address]      Pre-operative 5 CHG Bath Instructions   You can play a key role in reducing the risk of infection after surgery. Your skin needs to be as free of germs as possible. You can reduce the number of germs on your skin by washing with CHG (chlorhexidine  gluconate) soap before surgery. CHG is an antiseptic soap that kills germs and continues to kill germs even after washing.   DO NOT use if you have an allergy to chlorhexidine /CHG or antibacterial soaps. If your skin becomes reddened or irritated, stop using the CHG and notify one of our RNs at [phone].   Please shower with the CHG soap starting 4 days before surgery using the following schedule:     Please keep in mind the following:  DO NOT shave, including legs and underarms, starting the day of your first shower.   You may shave your face at any point before/day of surgery.  Place clean sheets on your bed the day you start using CHG soap. Use a clean washcloth (not used since being washed) for each shower. DO NOT sleep with pets once you start using the CHG.   CHG Shower Instructions:  If you choose to wash your hair and private area, wash first with  your normal shampoo/soap.  After you use shampoo/soap, rinse your hair and body thoroughly to remove shampoo/soap residue.  Turn the water  OFF and apply about 3 tablespoons (45 ml) of CHG soap to a CLEAN washcloth.  Apply CHG soap ONLY FROM YOUR NECK DOWN TO YOUR TOES (washing for 3-5 minutes)  DO NOT use CHG soap on face, private areas, open wounds, or sores.  Pay special attention to the area where your surgery is being performed.  If you are having back surgery, having someone wash your back for you may be helpful. Wait 2 minutes after CHG soap is applied, then you may rinse off the CHG soap.  Pat dry with a clean towel  Put on clean clothes/pajamas   If you choose to wear lotion, please use ONLY the CHG-compatible lotions on the back of this paper.     Additional instructions for the day of surgery:  DO NOT APPLY any lotions, deodorants, cologne, or perfumes.   Put on clean/comfortable clothes.  Brush your teeth.  Ask your nurse before applying any prescription medications to the skin.      CHG Compatible Lotions   Aveeno Moisturizing lotion  Cetaphil Moisturizing Cream  Cetaphil Moisturizing Lotion  Clairol Herbal Essence Moisturizing Lotion, Dry Skin  Clairol Herbal Essence Moisturizing Lotion, Extra Dry Skin  Clairol Herbal Essence Moisturizing Lotion, Normal Skin  Curel Age Defying Therapeutic Moisturizing Lotion with Alpha Hydroxy  Curel Extreme Care Body Lotion  Curel Soothing Hands Moisturizing Hand Lotion  Curel Therapeutic Moisturizing Cream, Fragrance-Free  Curel Therapeutic Moisturizing Lotion, Fragrance-Free  Curel Therapeutic Moisturizing Lotion, Original Formula  Eucerin Daily Replenishing Lotion  Eucerin Dry Skin Therapy Plus Alpha Hydroxy Crme  Eucerin Dry Skin Therapy Plus Alpha Hydroxy Lotion  Eucerin Original Crme  Eucerin Original Lotion  Eucerin Plus Crme Eucerin Plus Lotion  Eucerin TriLipid Replenishing Lotion  Keri Anti-Bacterial Hand Lotion   Keri Deep Conditioning Original Lotion Dry Skin Formula Softly Scented  Keri Deep Conditioning Original Lotion, Fragrance Free Sensitive Skin Formula  Keri Lotion Fast Absorbing Fragrance Free Sensitive Skin Formula  Keri Lotion Fast Absorbing Softly Scented Dry Skin Formula  Keri Original Lotion  Keri Skin Renewal Lotion Keri Silky Smooth Lotion  Keri Silky Smooth Sensitive Skin Lotion  Nivea Body Creamy Conditioning Oil  Nivea Body Extra Enriched Teacher, adult education Moisturizing Lotion Nivea Crme  Nivea Skin Firming Lotion  NutraDerm 30 Skin Lotion  NutraDerm Skin Lotion  NutraDerm Therapeutic Skin Cream  NutraDerm Therapeutic Skin Lotion  ProShield Protective Hand Cream  Provon moisturizing lotion

## 2023-12-22 DIAGNOSIS — Z13220 Encounter for screening for lipoid disorders: Secondary | ICD-10-CM | POA: Diagnosis not present

## 2023-12-22 DIAGNOSIS — R911 Solitary pulmonary nodule: Secondary | ICD-10-CM | POA: Diagnosis not present

## 2023-12-22 DIAGNOSIS — I10 Essential (primary) hypertension: Secondary | ICD-10-CM | POA: Diagnosis not present

## 2023-12-22 DIAGNOSIS — Z1331 Encounter for screening for depression: Secondary | ICD-10-CM | POA: Diagnosis not present

## 2023-12-22 DIAGNOSIS — Z09 Encounter for follow-up examination after completed treatment for conditions other than malignant neoplasm: Secondary | ICD-10-CM | POA: Diagnosis not present

## 2023-12-22 DIAGNOSIS — M5432 Sciatica, left side: Secondary | ICD-10-CM | POA: Diagnosis not present

## 2023-12-22 DIAGNOSIS — Z1329 Encounter for screening for other suspected endocrine disorder: Secondary | ICD-10-CM | POA: Diagnosis not present

## 2023-12-22 DIAGNOSIS — E538 Deficiency of other specified B group vitamins: Secondary | ICD-10-CM | POA: Diagnosis not present

## 2023-12-22 DIAGNOSIS — E559 Vitamin D deficiency, unspecified: Secondary | ICD-10-CM | POA: Diagnosis not present

## 2023-12-22 DIAGNOSIS — R7989 Other specified abnormal findings of blood chemistry: Secondary | ICD-10-CM | POA: Diagnosis not present

## 2023-12-22 DIAGNOSIS — D751 Secondary polycythemia: Secondary | ICD-10-CM | POA: Diagnosis not present

## 2023-12-22 DIAGNOSIS — R7303 Prediabetes: Secondary | ICD-10-CM | POA: Diagnosis not present

## 2023-12-22 DIAGNOSIS — Z8619 Personal history of other infectious and parasitic diseases: Secondary | ICD-10-CM | POA: Diagnosis not present

## 2023-12-22 DIAGNOSIS — G4730 Sleep apnea, unspecified: Secondary | ICD-10-CM | POA: Diagnosis not present

## 2023-12-22 DIAGNOSIS — D72829 Elevated white blood cell count, unspecified: Secondary | ICD-10-CM | POA: Diagnosis not present

## 2023-12-22 DIAGNOSIS — N400 Enlarged prostate without lower urinary tract symptoms: Secondary | ICD-10-CM | POA: Diagnosis not present

## 2023-12-22 DIAGNOSIS — Z125 Encounter for screening for malignant neoplasm of prostate: Secondary | ICD-10-CM | POA: Diagnosis not present

## 2023-12-22 DIAGNOSIS — E66811 Obesity, class 1: Secondary | ICD-10-CM | POA: Diagnosis not present

## 2023-12-22 DIAGNOSIS — D72819 Decreased white blood cell count, unspecified: Secondary | ICD-10-CM | POA: Diagnosis not present

## 2023-12-22 DIAGNOSIS — F419 Anxiety disorder, unspecified: Secondary | ICD-10-CM | POA: Diagnosis not present

## 2023-12-22 DIAGNOSIS — M5431 Sciatica, right side: Secondary | ICD-10-CM | POA: Diagnosis not present

## 2023-12-22 DIAGNOSIS — E299 Testicular dysfunction, unspecified: Secondary | ICD-10-CM | POA: Diagnosis not present

## 2023-12-30 ENCOUNTER — Inpatient Hospital Stay
Admission: RE | Admit: 2023-12-30 | Discharge: 2024-01-01 | DRG: 451 | Disposition: A | Discharge status: Home / Self Care | Attending: Neurosurgery | Admitting: Neurosurgery

## 2023-12-30 ENCOUNTER — Other Ambulatory Visit: Payer: Self-pay

## 2023-12-30 ENCOUNTER — Encounter: Payer: Self-pay | Admitting: Neurosurgery

## 2023-12-30 ENCOUNTER — Encounter
Admission: RE | Disposition: A | Discharge status: Home / Self Care | Payer: Self-pay | Source: Home / Self Care | Attending: Neurosurgery

## 2023-12-30 ENCOUNTER — Inpatient Hospital Stay: Admitting: Anesthesiology

## 2023-12-30 ENCOUNTER — Inpatient Hospital Stay: Payer: Self-pay | Admitting: Urgent Care

## 2023-12-30 ENCOUNTER — Inpatient Hospital Stay

## 2023-12-30 DIAGNOSIS — Z825 Family history of asthma and other chronic lower respiratory diseases: Secondary | ICD-10-CM

## 2023-12-30 DIAGNOSIS — G8929 Other chronic pain: Secondary | ICD-10-CM | POA: Diagnosis not present

## 2023-12-30 DIAGNOSIS — T84498A Other mechanical complication of other internal orthopedic devices, implants and grafts, initial encounter: Secondary | ICD-10-CM | POA: Diagnosis not present

## 2023-12-30 DIAGNOSIS — K219 Gastro-esophageal reflux disease without esophagitis: Secondary | ICD-10-CM | POA: Diagnosis not present

## 2023-12-30 DIAGNOSIS — Y838 Other surgical procedures as the cause of abnormal reaction of the patient, or of later complication, without mention of misadventure at the time of the procedure: Secondary | ICD-10-CM | POA: Diagnosis present

## 2023-12-30 DIAGNOSIS — Z8249 Family history of ischemic heart disease and other diseases of the circulatory system: Secondary | ICD-10-CM

## 2023-12-30 DIAGNOSIS — Z8261 Family history of arthritis: Secondary | ICD-10-CM | POA: Diagnosis not present

## 2023-12-30 DIAGNOSIS — Z85828 Personal history of other malignant neoplasm of skin: Secondary | ICD-10-CM

## 2023-12-30 DIAGNOSIS — F418 Other specified anxiety disorders: Secondary | ICD-10-CM | POA: Diagnosis not present

## 2023-12-30 DIAGNOSIS — I10 Essential (primary) hypertension: Secondary | ICD-10-CM | POA: Diagnosis not present

## 2023-12-30 DIAGNOSIS — T84498S Other mechanical complication of other internal orthopedic devices, implants and grafts, sequela: Secondary | ICD-10-CM

## 2023-12-30 DIAGNOSIS — Z01810 Encounter for preprocedural cardiovascular examination: Secondary | ICD-10-CM | POA: Diagnosis not present

## 2023-12-30 DIAGNOSIS — Z79899 Other long term (current) drug therapy: Secondary | ICD-10-CM

## 2023-12-30 DIAGNOSIS — Z981 Arthrodesis status: Principal | ICD-10-CM

## 2023-12-30 DIAGNOSIS — M5442 Lumbago with sciatica, left side: Secondary | ICD-10-CM | POA: Diagnosis not present

## 2023-12-30 DIAGNOSIS — G629 Polyneuropathy, unspecified: Secondary | ICD-10-CM | POA: Diagnosis present

## 2023-12-30 DIAGNOSIS — T84038A Mechanical loosening of other internal prosthetic joint, initial encounter: Secondary | ICD-10-CM | POA: Diagnosis not present

## 2023-12-30 DIAGNOSIS — M960 Pseudarthrosis after fusion or arthrodesis: Principal | ICD-10-CM | POA: Diagnosis present

## 2023-12-30 DIAGNOSIS — M5441 Lumbago with sciatica, right side: Secondary | ICD-10-CM | POA: Diagnosis not present

## 2023-12-30 DIAGNOSIS — Z01818 Encounter for other preprocedural examination: Secondary | ICD-10-CM

## 2023-12-30 SURGERY — POSTERIOR LUMBAR FUSION 1 LEVEL
Anesthesia: General | Site: Spine Lumbar

## 2023-12-30 MED ORDER — ORAL CARE MOUTH RINSE: 15.0000 mL | Freq: Once | OROMUCOSAL | Status: AC

## 2023-12-30 MED ORDER — SENNA 8.6 MG PO TABS
1.0000 | ORAL_TABLET | Freq: Two times a day (BID) | ORAL | Status: DC
  Administered 2023-12-30 – 2024-01-01 (×4): 8.6 mg via ORAL
  Filled 2023-12-30 (×4): qty 1

## 2023-12-30 MED ORDER — DROPERIDOL 2.5 MG/ML IJ SOLN: 0.6250 mg | Freq: Once | INTRAMUSCULAR | Status: DC | PRN

## 2023-12-30 MED ORDER — TAMSULOSIN HCL 0.4 MG PO CAPS
0.4000 mg | ORAL_CAPSULE | Freq: Two times a day (BID) | ORAL | Status: DC
  Administered 2023-12-30 – 2024-01-01 (×4): 0.4 mg via ORAL
  Filled 2023-12-30 (×4): qty 1

## 2023-12-30 MED ORDER — REMIFENTANIL HCL 1 MG IV SOLR
INTRAVENOUS | Status: AC
  Filled 2023-12-30: qty 1000

## 2023-12-30 MED ORDER — PHENYLEPHRINE 80 MCG/ML (10ML) SYRINGE FOR IV PUSH (FOR BLOOD PRESSURE SUPPORT)
PREFILLED_SYRINGE | INTRAVENOUS | Status: DC | PRN
  Administered 2023-12-30 (×2): 200 ug via INTRAVENOUS
  Administered 2023-12-30 (×3): 120 ug via INTRAVENOUS

## 2023-12-30 MED ORDER — BUPIVACAINE-EPINEPHRINE (PF) 0.5% -1:200000 IJ SOLN
INTRAMUSCULAR | Status: AC
  Filled 2023-12-30: qty 10

## 2023-12-30 MED ORDER — KETOROLAC TROMETHAMINE 30 MG/ML IJ SOLN
INTRAMUSCULAR | Status: AC
  Filled 2023-12-30: qty 1

## 2023-12-30 MED ORDER — SORBITOL 70 % SOLN
30.0000 mL | Freq: Every day | Status: DC | PRN
  Filled 2023-12-30: qty 30

## 2023-12-30 MED ORDER — SODIUM CHLORIDE (PF) 0.9 % IJ SOLN
INTRAMUSCULAR | Status: AC
  Filled 2023-12-30: qty 20

## 2023-12-30 MED ORDER — ONDANSETRON HCL 4 MG PO TABS: 4.0000 mg | ORAL_TABLET | Freq: Four times a day (QID) | ORAL | Status: DC | PRN

## 2023-12-30 MED ORDER — AMLODIPINE BESYLATE 10 MG PO TABS
10.0000 mg | ORAL_TABLET | ORAL | Status: DC
  Administered 2023-12-31 – 2024-01-01 (×2): 10 mg via ORAL
  Filled 2023-12-30 (×2): qty 1

## 2023-12-30 MED ORDER — METHOCARBAMOL 500 MG PO TABS
500.0000 mg | ORAL_TABLET | Freq: Four times a day (QID) | ORAL | Status: DC | PRN
  Administered 2023-12-30: 500 mg via ORAL
  Filled 2023-12-30: qty 1

## 2023-12-30 MED ORDER — ONDANSETRON HCL 4 MG/2ML IJ SOLN
INTRAMUSCULAR | Status: AC
  Filled 2023-12-30: qty 2

## 2023-12-30 MED ORDER — PROPOFOL 10 MG/ML IV BOLUS
INTRAVENOUS | Status: DC | PRN
  Administered 2023-12-30: 130 mg via INTRAVENOUS

## 2023-12-30 MED ORDER — MAGNESIUM CITRATE PO SOLN
1.0000 | Freq: Once | ORAL | Status: DC | PRN
  Filled 2023-12-30: qty 296

## 2023-12-30 MED ORDER — LIDOCAINE HCL (PF) 2 % IJ SOLN
INTRAMUSCULAR | Status: AC
  Filled 2023-12-30: qty 5

## 2023-12-30 MED ORDER — BUPIVACAINE-EPINEPHRINE (PF) 0.5% -1:200000 IJ SOLN
INTRAMUSCULAR | Status: DC | PRN
Start: 2023-12-30 — End: 2023-12-30
  Administered 2023-12-30: 10 mL

## 2023-12-30 MED ORDER — SODIUM CHLORIDE 0.9% FLUSH
3.0000 mL | Freq: Two times a day (BID) | INTRAVENOUS | Status: DC
  Administered 2023-12-30 – 2023-12-31 (×4): 3 mL via INTRAVENOUS

## 2023-12-30 MED ORDER — OXYCODONE HCL 5 MG/5ML PO SOLN: 5.0000 mg | Freq: Once | ORAL | Status: DC | PRN

## 2023-12-30 MED ORDER — FENTANYL CITRATE (PF) 100 MCG/2ML IJ SOLN
INTRAMUSCULAR | Status: AC
  Filled 2023-12-30: qty 2

## 2023-12-30 MED ORDER — VANCOMYCIN HCL 1000 MG IV SOLR
INTRAVENOUS | Status: AC
Start: 2023-12-30 — End: 2023-12-30
  Filled 2023-12-30: qty 20

## 2023-12-30 MED ORDER — DEXAMETHASONE SODIUM PHOSPHATE 10 MG/ML IJ SOLN
INTRAMUSCULAR | Status: DC | PRN
  Administered 2023-12-30: 10 mg via INTRAVENOUS

## 2023-12-30 MED ORDER — BUPIVACAINE HCL (PF) 0.5 % IJ SOLN
INTRAMUSCULAR | Status: AC
  Filled 2023-12-30: qty 30

## 2023-12-30 MED ORDER — BRIMONIDINE TARTRATE 0.2 % OP SOLN
1.0000 [drp] | Freq: Two times a day (BID) | OPHTHALMIC | Status: DC
Start: 2023-12-30 — End: 2024-01-01
  Administered 2023-12-30 – 2024-01-01 (×4): 1 [drp] via OPHTHALMIC
  Filled 2023-12-30 (×2): qty 5

## 2023-12-30 MED ORDER — ARIPIPRAZOLE 2 MG PO TABS
5.0000 mg | ORAL_TABLET | ORAL | Status: DC
  Administered 2023-12-31 – 2024-01-01 (×2): 5 mg via ORAL
  Filled 2023-12-30 (×2): qty 3

## 2023-12-30 MED ORDER — CEFAZOLIN IN SODIUM CHLORIDE 2-0.9 GM/100ML-% IV SOLN
2.0000 g | Freq: Once | INTRAVENOUS | Status: AC
  Administered 2023-12-30: 2 g via INTRAVENOUS

## 2023-12-30 MED ORDER — MENTHOL 3 MG MT LOZG
1.0000 | LOZENGE | OROMUCOSAL | Status: DC | PRN
  Filled 2023-12-30: qty 9

## 2023-12-30 MED ORDER — POLYETHYLENE GLYCOL 3350 17 G PO PACK: 17.0000 g | PACK | Freq: Every day | ORAL | Status: DC | PRN

## 2023-12-30 MED ORDER — OXYCODONE HCL 5 MG PO TABS: 5.0000 mg | ORAL_TABLET | ORAL | Status: DC | PRN

## 2023-12-30 MED ORDER — IPRATROPIUM BROMIDE 0.03 % NA SOLN
2.0000 | Freq: Two times a day (BID) | NASAL | Status: DC | PRN
  Filled 2023-12-30: qty 30

## 2023-12-30 MED ORDER — TRAZODONE HCL 100 MG PO TABS
100.0000 mg | ORAL_TABLET | Freq: Every evening | ORAL | Status: DC | PRN
  Administered 2023-12-31: 100 mg via ORAL
  Filled 2023-12-30: qty 1

## 2023-12-30 MED ORDER — ENOXAPARIN SODIUM 40 MG/0.4ML IJ SOSY
40.0000 mg | PREFILLED_SYRINGE | INTRAMUSCULAR | Status: DC
  Administered 2023-12-31 – 2024-01-01 (×2): 40 mg via SUBCUTANEOUS
  Filled 2023-12-30 (×2): qty 0.4

## 2023-12-30 MED ORDER — CHLORHEXIDINE GLUCONATE 0.12 % MT SOLN
15.0000 mL | Freq: Once | OROMUCOSAL | Status: AC
  Administered 2023-12-30: 15 mL via OROMUCOSAL

## 2023-12-30 MED ORDER — PHENYLEPHRINE HCL-NACL 20-0.9 MG/250ML-% IV SOLN
INTRAVENOUS | Status: DC | PRN
  Administered 2023-12-30: 20 ug/min via INTRAVENOUS

## 2023-12-30 MED ORDER — KETOROLAC TROMETHAMINE 30 MG/ML IJ SOLN
INTRAMUSCULAR | Status: DC | PRN
  Administered 2023-12-30: 30 mg via INTRAVENOUS

## 2023-12-30 MED ORDER — PHENOL 1.4 % MT LIQD
1.0000 | OROMUCOSAL | Status: DC | PRN
  Filled 2023-12-30: qty 177

## 2023-12-30 MED ORDER — SODIUM CHLORIDE (PF) 0.9 % IJ SOLN
INTRAMUSCULAR | Status: DC | PRN
  Administered 2023-12-30: 60 mL via INTRAMUSCULAR

## 2023-12-30 MED ORDER — PROPOFOL 1000 MG/100ML IV EMUL
INTRAVENOUS | Status: AC
  Filled 2023-12-30: qty 100

## 2023-12-30 MED ORDER — OXYCODONE HCL 5 MG PO TABS
10.0000 mg | ORAL_TABLET | ORAL | Status: DC | PRN
  Administered 2023-12-30 – 2024-01-01 (×5): 10 mg via ORAL
  Filled 2023-12-30 (×5): qty 2

## 2023-12-30 MED ORDER — METHOCARBAMOL 1000 MG/10ML IJ SOLN: 500.0000 mg | Freq: Four times a day (QID) | INTRAMUSCULAR | Status: DC | PRN

## 2023-12-30 MED ORDER — ACETAMINOPHEN 325 MG PO TABS: 650.0000 mg | ORAL_TABLET | ORAL | Status: DC | PRN

## 2023-12-30 MED ORDER — PROPOFOL 500 MG/50ML IV EMUL
INTRAVENOUS | Status: DC | PRN
  Administered 2023-12-30: 125 ug/kg/min via INTRAVENOUS
  Administered 2023-12-30: 30 mg via INTRAVENOUS

## 2023-12-30 MED ORDER — CEFAZOLIN SODIUM-DEXTROSE 2-4 GM/100ML-% IV SOLN
INTRAVENOUS | Status: AC
  Filled 2023-12-30: qty 100

## 2023-12-30 MED ORDER — LACTATED RINGERS IV SOLN: INTRAVENOUS | Status: DC

## 2023-12-30 MED ORDER — VITAMIN D3 25 MCG (1000 UNIT) PO TABS
4000.0000 [IU] | ORAL_TABLET | Freq: Every day | ORAL | Status: DC
  Administered 2023-12-31 – 2024-01-01 (×2): 4000 [IU] via ORAL
  Filled 2023-12-30 (×4): qty 4

## 2023-12-30 MED ORDER — LOSARTAN POTASSIUM 50 MG PO TABS
25.0000 mg | ORAL_TABLET | Freq: Every day | ORAL | Status: DC
  Administered 2023-12-31 – 2024-01-01 (×2): 25 mg via ORAL
  Filled 2023-12-30 (×2): qty 1

## 2023-12-30 MED ORDER — FENTANYL CITRATE (PF) 100 MCG/2ML IJ SOLN
INTRAMUSCULAR | Status: AC
Start: 2023-12-30 — End: 2023-12-30
  Filled 2023-12-30: qty 2

## 2023-12-30 MED ORDER — HYDROMORPHONE HCL 1 MG/ML IJ SOLN: 1.0000 mg | INTRAMUSCULAR | Status: AC | PRN

## 2023-12-30 MED ORDER — ACETAMINOPHEN 10 MG/ML IV SOLN
INTRAVENOUS | Status: AC
  Filled 2023-12-30: qty 100

## 2023-12-30 MED ORDER — REMIFENTANIL HCL 1 MG IV SOLR
INTRAVENOUS | Status: DC | PRN
  Administered 2023-12-30: .15 ug/kg/min via INTRAVENOUS

## 2023-12-30 MED ORDER — BUPIVACAINE-EPINEPHRINE (PF) 0.5% -1:200000 IJ SOLN
INTRAMUSCULAR | Status: AC
Start: 2023-12-30 — End: 2023-12-30
  Filled 2023-12-30: qty 10

## 2023-12-30 MED ORDER — CHLORHEXIDINE GLUCONATE 0.12 % MT SOLN
OROMUCOSAL | Status: AC
  Filled 2023-12-30: qty 15

## 2023-12-30 MED ORDER — VANCOMYCIN HCL IN DEXTROSE 1-5 GM/200ML-% IV SOLN
INTRAVENOUS | Status: AC
  Filled 2023-12-30: qty 200

## 2023-12-30 MED ORDER — 0.9 % SODIUM CHLORIDE (POUR BTL) OPTIME
TOPICAL | Status: DC | PRN
  Administered 2023-12-30: 500 mL

## 2023-12-30 MED ORDER — FENTANYL CITRATE (PF) 100 MCG/2ML IJ SOLN
25.0000 ug | INTRAMUSCULAR | Status: DC | PRN
  Administered 2023-12-30 (×2): 50 ug via INTRAVENOUS

## 2023-12-30 MED ORDER — VANCOMYCIN HCL IN DEXTROSE 1-5 GM/200ML-% IV SOLN
1000.0000 mg | Freq: Once | INTRAVENOUS | Status: AC
  Administered 2023-12-30: 1000 mg via INTRAVENOUS

## 2023-12-30 MED ORDER — FLUTICASONE PROPIONATE 50 MCG/ACT NA SUSP
2.0000 | NASAL | Status: DC | PRN
  Filled 2023-12-30: qty 16

## 2023-12-30 MED ORDER — EPHEDRINE 5 MG/ML INJ
INTRAVENOUS | Status: AC
  Filled 2023-12-30: qty 5

## 2023-12-30 MED ORDER — MIDAZOLAM HCL 2 MG/2ML IJ SOLN
INTRAMUSCULAR | Status: DC | PRN
  Administered 2023-12-30: 2 mg via INTRAVENOUS

## 2023-12-30 MED ORDER — ACETAMINOPHEN 10 MG/ML IV SOLN
INTRAVENOUS | Status: DC | PRN
  Administered 2023-12-30: 1000 mg via INTRAVENOUS

## 2023-12-30 MED ORDER — DULOXETINE HCL 30 MG PO CPEP
60.0000 mg | ORAL_CAPSULE | ORAL | Status: DC
  Administered 2023-12-31 – 2024-01-01 (×2): 60 mg via ORAL
  Filled 2023-12-30 (×2): qty 2

## 2023-12-30 MED ORDER — ONDANSETRON HCL 4 MG/2ML IJ SOLN: 4.0000 mg | Freq: Four times a day (QID) | INTRAMUSCULAR | Status: DC | PRN

## 2023-12-30 MED ORDER — SPIRONOLACTONE 25 MG PO TABS
25.0000 mg | ORAL_TABLET | ORAL | Status: DC
  Administered 2023-12-31 – 2024-01-01 (×2): 25 mg via ORAL
  Filled 2023-12-30 (×2): qty 1

## 2023-12-30 MED ORDER — CLONAZEPAM 0.5 MG PO TABS: 0.5000 mg | ORAL_TABLET | Freq: Two times a day (BID) | ORAL | Status: DC | PRN

## 2023-12-30 MED ORDER — DOCUSATE SODIUM 100 MG PO CAPS
100.0000 mg | ORAL_CAPSULE | Freq: Two times a day (BID) | ORAL | Status: DC
  Administered 2023-12-30 – 2024-01-01 (×4): 100 mg via ORAL
  Filled 2023-12-30 (×4): qty 1

## 2023-12-30 MED ORDER — BUPIVACAINE LIPOSOME 1.3 % IJ SUSP
INTRAMUSCULAR | Status: AC
  Filled 2023-12-30: qty 20

## 2023-12-30 MED ORDER — PROPOFOL 10 MG/ML IV BOLUS
INTRAVENOUS | Status: AC
  Filled 2023-12-30: qty 40

## 2023-12-30 MED ORDER — ACETAMINOPHEN 500 MG PO TABS
1000.0000 mg | ORAL_TABLET | Freq: Four times a day (QID) | ORAL | Status: DC
  Administered 2023-12-30 – 2024-01-01 (×6): 1000 mg via ORAL
  Filled 2023-12-30 (×8): qty 2

## 2023-12-30 MED ORDER — SURGIFLO WITH THROMBIN (HEMOSTATIC MATRIX KIT) OPTIME
TOPICAL | Status: DC | PRN
Start: 2023-12-30 — End: 2023-12-30
  Administered 2023-12-30: 1 via TOPICAL

## 2023-12-30 MED ORDER — ACETAMINOPHEN 650 MG RE SUPP: 650.0000 mg | RECTAL | Status: DC | PRN

## 2023-12-30 MED ORDER — LIDOCAINE HCL (PF) 2 % IJ SOLN
INTRAMUSCULAR | Status: DC | PRN
Start: 2023-12-30 — End: 2023-12-30
  Administered 2023-12-30: 100 mg via INTRADERMAL

## 2023-12-30 MED ORDER — SODIUM CHLORIDE 0.9 % IV SOLN: 250.0000 mL | INTRAVENOUS | Status: AC

## 2023-12-30 MED ORDER — VANCOMYCIN HCL 1 G IV SOLR
INTRAVENOUS | Status: DC | PRN
  Administered 2023-12-30: 1000 mg via TOPICAL

## 2023-12-30 MED ORDER — OXYCODONE HCL 5 MG PO TABS: 5.0000 mg | ORAL_TABLET | Freq: Once | ORAL | Status: DC | PRN

## 2023-12-30 MED ORDER — DEXAMETHASONE SODIUM PHOSPHATE 10 MG/ML IJ SOLN
INTRAMUSCULAR | Status: AC
  Filled 2023-12-30: qty 1

## 2023-12-30 MED ORDER — EPHEDRINE SULFATE-NACL 50-0.9 MG/10ML-% IV SOSY
PREFILLED_SYRINGE | INTRAVENOUS | Status: DC | PRN
  Administered 2023-12-30 (×3): 7.5 mg via INTRAVENOUS
  Administered 2023-12-30: 5 mg via INTRAVENOUS
  Administered 2023-12-30: 10 mg via INTRAVENOUS

## 2023-12-30 MED ORDER — FENTANYL CITRATE (PF) 100 MCG/2ML IJ SOLN
INTRAMUSCULAR | Status: DC | PRN
  Administered 2023-12-30: 50 ug via INTRAVENOUS

## 2023-12-30 MED ORDER — IRRISEPT - 450ML BOTTLE WITH 0.05% CHG IN STERILE WATER, USP 99.95% OPTIME
TOPICAL | Status: DC | PRN
  Administered 2023-12-30: 450 mL via TOPICAL

## 2023-12-30 MED ORDER — ONDANSETRON HCL 4 MG/2ML IJ SOLN
INTRAMUSCULAR | Status: DC | PRN
  Administered 2023-12-30: 4 mg via INTRAVENOUS

## 2023-12-30 MED ORDER — SODIUM CHLORIDE 0.9% FLUSH: 3.0000 mL | INTRAVENOUS | Status: DC | PRN

## 2023-12-30 MED ORDER — MIDAZOLAM HCL 2 MG/2ML IJ SOLN
INTRAMUSCULAR | Status: AC
  Filled 2023-12-30: qty 2

## 2023-12-30 MED ORDER — ACETAMINOPHEN 10 MG/ML IV SOLN: 1000.0000 mg | Freq: Once | INTRAVENOUS | Status: DC | PRN

## 2023-12-30 MED ORDER — GLYCOPYRROLATE 0.2 MG/ML IJ SOLN
INTRAMUSCULAR | Status: DC | PRN
  Administered 2023-12-30: .2 mg via INTRAVENOUS

## 2023-12-30 MED ORDER — KETOROLAC TROMETHAMINE 15 MG/ML IJ SOLN
7.5000 mg | Freq: Four times a day (QID) | INTRAMUSCULAR | Status: AC
  Administered 2023-12-30 – 2023-12-31 (×4): 7.5 mg via INTRAVENOUS
  Filled 2023-12-30 (×4): qty 1

## 2023-12-30 SURGICAL SUPPLY — 43 items
ALLOGRAFT BONESTRP KORE 2.5X10 (Bone Implant) IMPLANT
BASIN KIT SINGLE STR (MISCELLANEOUS) ×1 IMPLANT
BUR NEURO DRILL SOFT 3.0X3.8M (BURR) ×1 IMPLANT
DERMABOND ADVANCED .7 DNX12 (GAUZE/BANDAGES/DRESSINGS) IMPLANT
DRAPE LAPAROTOMY 100X77 ABD (DRAPES) ×1 IMPLANT
DRAPE TABLE BACK 80X90 (DRAPES) ×1 IMPLANT
DRSG OPSITE POSTOP 4X10 (GAUZE/BANDAGES/DRESSINGS) IMPLANT
DRSG TEGADERM 4X4.75 (GAUZE/BANDAGES/DRESSINGS) ×1 IMPLANT
ELECTRODE REM PT RTRN 9FT ADLT (ELECTROSURGICAL) ×1 IMPLANT
EVACUATOR 1/8 PVC DRAIN (DRAIN) IMPLANT
GLOVE BIOGEL PI IND STRL 6.5 (GLOVE) ×1 IMPLANT
GLOVE SURG SYN 6.5 PF PI (GLOVE) ×2 IMPLANT
GLOVE SURG SYN 8.5 PF PI (GLOVE) ×3 IMPLANT
GOWN SRG LRG LVL 4 IMPRV REINF (GOWNS) ×1 IMPLANT
GOWN SRG XL LVL 3 NONREINFORCE (GOWNS) ×1 IMPLANT
HOLDER FOLEY CATH W/STRAP (MISCELLANEOUS) ×1 IMPLANT
KIT INFUSE MEDIUM (Orthopedic Implant) IMPLANT
KIT PREVENA INCISION MGT 13 (CANNISTER) IMPLANT
KIT SPINAL PRONEVIEW (KITS) ×1 IMPLANT
LAVAGE JET IRRISEPT WOUND (IRRIGATION / IRRIGATOR) ×1 IMPLANT
MANIFOLD NEPTUNE II (INSTRUMENTS) ×1 IMPLANT
MARKER SKIN DUAL TIP RULER LAB (MISCELLANEOUS) IMPLANT
NDL SAFETY ECLIPSE 18X1.5 (NEEDLE) ×1 IMPLANT
NS IRRIG 500ML POUR BTL (IV SOLUTION) ×1 IMPLANT
PACK LAMINECTOMY ARMC (PACKS) ×1 IMPLANT
PAD ARMBOARD POSITIONER FOAM (MISCELLANEOUS) ×1 IMPLANT
ROD RELINE-O 5.5X120MM LORD (Rod) IMPLANT
SCREW LOCK RELINE 5.5 TULIP (Screw) IMPLANT
SCREW RELINE-O 10.5X45 POLY (Screw) IMPLANT
SCREW RELINE-O 10.5X45MM POLY (Screw) ×2 IMPLANT
SCREW RELINE-O 10.5X90 POLY (Screw) IMPLANT
SCREW RELINE-O 10.5X90MM POLY (Screw) ×1 IMPLANT
SCREW SPINAL 10.5X100 RELINE (Screw) IMPLANT
SCREW SPINAL 9.5X100 RELINE (Screw) IMPLANT
STAPLER SKIN PROX 35W (STAPLE) IMPLANT
SURGIFLO W/THROMBIN 8M KIT (HEMOSTASIS) ×1 IMPLANT
SUT STRATA 3-0 15 PS-2 (SUTURE) IMPLANT
SUT VIC AB 0 CT1 27XCR 8 STRN (SUTURE) ×2 IMPLANT
SUT VIC AB 2-0 CT1 18 (SUTURE) ×2 IMPLANT
SUTURE EHLN 3-0 FS-10 30 BLK (SUTURE) IMPLANT
SYR 10ML LL (SYRINGE) ×1 IMPLANT
SYR 30ML LL (SYRINGE) ×2 IMPLANT
TRAP FLUID SMOKE EVACUATOR (MISCELLANEOUS) ×1 IMPLANT

## 2023-12-30 NOTE — Discharge Instructions (Signed)
 Your surgeon has performed an operation on your lumbar spine (low back) to relieve pressure on one or more nerves. Many times, patients feel better immediately after surgery and can "overdo it." Even if you feel well, it is important that you follow these activity guidelines. If you do not let your back heal properly from the surgery, you can increase the chance of hardware complications and/or return of your symptoms. The following are instructions to help in your recovery once you have been discharged from the hospital.  Do not use NSAIDs for 3 months after surgery.  *Regarding compression stockings-  Please wear day and night until you are walking a couple hundred feet three times a day.   Activity    No bending, lifting, or twisting ("BLT"). Avoid lifting objects heavier than 10 pounds (gallon milk jug).  Where possible, avoid household activities that involve lifting, bending, pushing, or pulling such as laundry, vacuuming, grocery shopping, and childcare. Try to arrange for help from friends and family for these activities while your back heals.  Increase physical activity slowly as tolerated.  Taking short walks is encouraged, but avoid strenuous exercise. Do not jog, run, bicycle, lift weights, or participate in any other exercises unless specifically allowed by your doctor. Avoid prolonged sitting, including car rides.  Talk to your doctor before resuming sexual activity.  You should not drive until cleared by your doctor.  Until released by your doctor, you should not return to work or school.  You should rest at home and let your body heal.   You may shower three days after your surgery.  After showering, lightly dab your incision dry. Do not take a tub bath or go swimming for 3 weeks, or until approved by your doctor at your follow-up appointment.  If you smoke, we strongly recommend that you quit.  Smoking has been proven to interfere with normal healing in your back and will  dramatically reduce the success rate of your surgery. Please contact [facility] (800-QUIT-NOW) and use the resources at www.[facility].com for assistance in stopping smoking.  Surgical Incision   If you have a dressing on your incision, you may remove it three days after your surgery. Keep your incision area clean and dry.  Your incision was closed with Dermabond glue. The glue should begin to peel away within about a week.  Diet            You may return to your usual diet. Be sure to stay hydrated.  When to Contact Us   Although your surgery and recovery will likely be uneventful, you may have some residual numbness, aches, and pains in your back and/or legs. This is normal and should improve in the next few weeks.  However, should you experience any of the following, contact us  immediately: New numbness or weakness Pain that is progressively getting worse, and is not relieved by your pain medications or rest Bleeding, redness, swelling, pain, or drainage from surgical incision Chills or flu-like symptoms Fever greater than 101.0 F (38.3 C) Problems with bowel or bladder functions Difficulty breathing or shortness of breath Warmth, tenderness, or swelling in your calf  Contact Information How to contact us :  If you have any questions/concerns before or after surgery, you can reach us  at [phone], or you can send a mychart message. We can be reached by phone or mychart 8am-4pm, Monday-Friday.  *Please note: Calls after 4pm are forwarded to a third party answering service. Mychart messages are not routinely monitored during evenings,  weekends, and holidays. Please call our office to contact the answering service for urgent concerns during non-business hours.

## 2023-12-30 NOTE — Anesthesia Preprocedure Evaluation (Addendum)
 Anesthesia Evaluation  Patient identified by MRN, date of birth, ID band Patient awake    Reviewed: Allergy & Precautions, H&P , NPO status , Patient's Chart, lab work & pertinent test results  Airway Mallampati: II  TM Distance: >3 FB Neck ROM: full    Dental no notable dental hx.    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; c.) followed by hematology -->               requires therpeutic phlebotomy for   Anesthesia Other Findings Past Medical History: No date: Actinic keratosis No date: Anxiety     Comment:  a.) on BZO PRN (clonazepam ) No date: Arthritis 10/09/2016: Basal cell carcinoma     Comment:  L sup med scapula  07/11/2019: Basal cell carcinoma     Comment:  L lat forehead above lat brow  08/29/2020: Basal cell carcinoma     Comment:  R prox medial pretibial, EDC 08/29/2020: Basal cell carcinoma     Comment:  R posterior ear, EDC 12/27/2020: BCC (basal cell carcinoma of skin)     Comment:  R epigastric - ED&C 12/07/2014: Benign essential HTN     Comment:  Last Assessment & Plan:  Relevant Hx: Course: Daily               Update: Today's Plan: CONTROLLED ON MEDS 03/31/2013: Benign prostatic hyperplasia with urinary obstruction No date: Bilateral sciatica No date: Chronic back pain     Comment:  a.) s/p multiple back surgeries 03/31/2013: Chronic inflammation of tunica albuginea No date: Cirrhosis (HCC) 11/06/2014: DDD (degenerative disc disease), cervical 11/06/2014: DDD (degenerative  disc disease), lumbar No date: Depression 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     Comment:  face, numerous lesions removed  12/07/2014: Esophagitis, reflux No date: GERD (gastroesophageal reflux disease)     Comment:  a.) s/p Nissen fundoplication No date: Hepatitis C virus infection cured after antiviral drug  therapy     Comment:  a.) s/p Tx with ledipasvir/sofosbuvir No date: History of kidney stones 03/01/2013: Idiopathic scoliosis and kyphoscoliosis No date: Leukopenia No date: Neuropathy of both feet (severe) No date: Painful legs and moving toes     Comment:  DUE TO BACK PROBLEMS No date: Prostatitis 11/06/2014: Sacroiliac joint dysfunction No date: Scoliosis No date: Secondary erythrocytosis     Comment:  a.) secondary to exogenous testosterone  use + OSA; b.)               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; c.) followed by hematology -->               requires therpeutic phlebotomy for Hct > 50 No date: Secondary erythrocytosis     Comment:  from testosterone  injections No date: Sleep apnea     Comment:  a.) no nocturnal PAP therapy 12/07/2014: Spinal stenosis 11/04/2011: Testicular dysfunction     Comment:  a.) on exogenous TRT (depotestosterone cypionate)  Past Surgical History: 01/26/2023: ANTERIOR LAT LUMBAR FUSION; N/A     Comment:  Procedure: L2-3  LATERAL LUMBAR INTERBODY FUSION;                Surgeon: Clois Fret, MD;  Location: [facility];                Service: Neurosurgery;  Laterality: N/A; 01/26/2023: APPLICATION OF INTRAOPERATIVE CT SCAN; N/A     Comment:  Procedure: APPLICATION OF INTRAOPERATIVE CT SCAN;                Surgeon: Clois Fret, MD;  Location: [facility];                Service: Neurosurgery;  Laterality: N/A; 04/14/2013: BACK SURGERY     Comment:  L2-S1 No date: Cancer of right ear     Comment:  3/17 06/10/1987: CATARACT EXTRACTION; Left No date: CERVICAL SPINE SURGERY     Comment:   X2 FUSION C4-7 07/10/2013:  CHOLECYSTECTOMY 07/22/2010: COLONOSCOPY WITH ESOPHAGOGASTRODUODENOSCOPY (EGD) 03/11/2004: COLONOSCOPY WITH ESOPHAGOGASTRODUODENOSCOPY (EGD) 12/18/2014: COLONOSCOPY WITH PROPOFOL ; N/A     Comment:  Procedure: COLONOSCOPY WITH PROPOFOL ;  Surgeon: Rogelia Copping, MD;  Location: [facility];  Service:               Endoscopy;  Laterality: N/A; 09/23/2021: COLONOSCOPY WITH PROPOFOL ; N/A     Comment:  Procedure: COLONOSCOPY WITH PROPOFOL ;  Surgeon:               Maryruth Ole DASEN, MD;  Location: [facility];                Service: Endoscopy;  Laterality: N/A; 04/02/2021: CYSTOSCOPY WITH INSERTION OF UROLIFT; N/A     Comment:  Procedure: CYSTOSCOPY WITH INSERTION OF UROLIFT;                Surgeon: Twylla Glendia BROCKS, MD;  Location: [facility];                Service: Urology;  Laterality: N/A; 02/08/2012: ELBOW ARTHROSCOPY; Right No date: ELBOW SURGERY; Left 12/18/2014: ESOPHAGOGASTRODUODENOSCOPY (EGD) WITH PROPOFOL ; N/A     Comment:  Procedure: ESOPHAGOGASTRODUODENOSCOPY (EGD) WITH               PROPOFOL  with dialtion;  Surgeon: Rogelia Copping, MD;                Location: [facility];  Service: Endoscopy;                Laterality: N/A; 09/23/2021: ESOPHAGOGASTRODUODENOSCOPY (EGD) WITH PROPOFOL ; N/A     Comment:  Procedure: ESOPHAGOGASTRODUODENOSCOPY (EGD) WITH               PROPOFOL ;  Surgeon: Maryruth Ole DASEN, MD;  Location:               [facility];  Service: Endoscopy;  Laterality: N/A; 06/09/1968: FACIAL RECONSTRUCTION SURGERY     Comment:  BOTTLE CUT NOSE AND EYE, MULTIPLE SURGERIES 06/09/2013: FINGER SURGERY; Left     Comment:  THUMB 06/09/1964: FINGER SURGERY; Right     Comment:  MIDDLE 06/09/1982: FOOT SURGERY; Right     Comment:  fracture 06/10/1999: FUNDIPLICATION 1981/1957: HERNIA REPAIR; Left     Comment:  RIGHT SIDE IN 1957 2021: JOINT REPLACEMENT; Left     Comment:  knee 02/10/2020: KNEE ARTHROPLASTY; Left     Comment:  Procedure:  COMPUTER ASSISTED TOTAL KNEE ARTHROPLASTY -  RNFA;  Surgeon: Mardee Lynwood SQUIBB, MD;  Location: [facility];              Service: Orthopedics;  Laterality: Left; 10/24/2022: KNEE ARTHROPLASTY; Right     Comment:  Procedure: COMPUTER ASSISTED TOTAL KNEE ARTHROPLASTY;                Surgeon: Mardee Lynwood SQUIBB, MD;  Location: [facility];                Service: Orthopedics;  Laterality: Right; 08/14/2021: KNEE ARTHROSCOPY; Right     Comment:  Procedure: ARTHROSCOPY KNEE, medial menisectomy and               chondroplasty, loose bodies removal;  Surgeon: Mardee Lynwood SQUIBB, MD;  Location: [facility];  Service: Orthopedics;               Laterality: Right; 01/26/2023: LUMBAR LAMINECTOMY/DECOMPRESSION MICRODISCECTOMY; N/A     Comment:  Procedure: T12-L1 DECOMPRESSION, L1-2 POSTERIOR SPINAL               DECOMPRESSION;  Surgeon: Clois Fret, MD;                Location: [facility];  Service: Neurosurgery;  Laterality:               N/A; 02/16/2015: MASS EXCISION; Left     Comment:  Procedure: LEFT BURSA ELBOW EXCISION;  Surgeon: Helayne Glenn, MD;  Location: [facility];  Service:               Orthopedics;  Laterality: Left; 06/10/1999: NISSEN FUNDOPLICATION 12/01/2002: NOSE SURGERY     Comment:  SEPTUM RECONSTRUCTION 12/18/2014: POLYPECTOMY     Comment:  Procedure: POLYPECTOMY;  Surgeon: Rogelia Copping, MD;                Location: [facility];  Service: Endoscopy;; 01/26/2023: POSTERIOR LUMBAR FUSION 4 WITH HARDWARE REMOVAL; N/A     Comment:  Procedure: T10-PELVIS POSTERIOR SPINAL FUSION;  Surgeon:              Clois Fret, MD;  Location: [facility];  Service:               Neurosurgery;  Laterality: N/A; 06/09/1997: SEPTOPLASTY 06/09/1981: SHOULDER SURGERY; Right 06/09/1977: SHOULDER SURGERY; Left     Comment:  WITH ELBOW (TENDON RELEASE) 06/09/2002: SKIN SURGERY     Comment:  SKIN CANCER NOSE 01/26/2023: TRANSFORAMINAL LUMBAR  INTERBODY FUSION W/ MIS 1 LEVEL; N/A     Comment:  Procedure: L5-S1 TRANSFORAMINAL LUMBAR INTERBODY FUSION               (TLIF);  Surgeon: Clois Fret, MD;  Location: [facility]              [facility];  Service: Neurosurgery;  Laterality: N/A;     Reproductive/Obstetrics negative OB ROS                              Anesthesia Physical Anesthesia Plan  ASA: 2  Anesthesia Plan: General ETT   Post-op Pain Management: Toradol  IV (intra-op)* and Ofirmev  IV (intra-op)*   Induction: Intravenous  PONV Risk Score and Plan: 2 and Ondansetron , Dexamethasone , Midazolam  and TIVA  Airway Management Planned: Oral ETT  Additional Equipment:  Intra-op Plan:   Post-operative Plan: Extubation in OR  Informed Consent: I have reviewed the patients History and Physical, chart, labs and discussed the procedure including the risks, benefits and alternatives for the proposed anesthesia with the patient or authorized representative who has indicated his/her understanding and acceptance.     Dental Advisory Given  Plan Discussed with: CRNA and Surgeon  Anesthesia Plan Comments:          Anesthesia Quick Evaluation

## 2023-12-30 NOTE — Anesthesia Procedure Notes (Signed)
 Procedure Name: Intubation Date/Time: 12/30/2023 10:22 AM  Performed by: Kearney Rosina SAILOR, RNPre-anesthesia Checklist: Patient identified, Emergency Drugs available, Suction available and Patient being monitored Patient Re-evaluated:Patient Re-evaluated prior to induction Oxygen Delivery Method: Circle system utilized Preoxygenation: Pre-oxygenation with 100% oxygen Induction Type: IV induction Ventilation: Mask ventilation without difficulty Laryngoscope Size: McGrath and 4 Grade View: Grade I Tube type: Oral Tube size: 7.5 mm Number of attempts: 1 Airway Equipment and Method: Stylet and Oral airway Placement Confirmation: ETT inserted through vocal cords under direct vision, positive ETCO2 and breath sounds checked- equal and bilateral Secured at: 23 cm Tube secured with: Tape Dental Injury: Teeth and Oropharynx as per pre-operative assessment  Comments: Atraumatic, head and neck remained neutral during intubation

## 2023-12-30 NOTE — Transfer of Care (Signed)
 Immediate Anesthesia Transfer of Care Note  Patient: [name]  Procedure(s) Performed: REVISION OF L5-S1 Posterior FUSION (Spine Lumbar)  Patient Location: PACU  Anesthesia Type:General  Level of Consciousness: drowsy and patient cooperative  Airway & Oxygen Therapy: Patient Spontanous Breathing and Patient connected to face mask oxygen  Post-op Assessment: Report given to RN and Post -op Vital signs reviewed and stable  Post vital signs: Reviewed and stable  Last Vitals:  Vitals Value Taken Time  BP    Temp    Pulse 72 12/30/23 13:55  Resp 12 12/30/23 13:55  SpO2 95 % 12/30/23 13:55  Vitals shown include unfiled device data.  Last Pain:  Vitals:   12/30/23 0909  TempSrc: Temporal  PainSc: 5          Complications: There were no known notable events for this encounter.

## 2023-12-30 NOTE — Op Note (Addendum)
 Indications: 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 The patient understood these risks and agreed to proceed.  Operative Note:  1. L5/S1 fusion with removal and replacement of S1 and S2 screws.    The patient was brought to the Operating Room, intubated and turned into the prone position. All pressure points were checked and double checked. The patient was prepped and draped in the standard fashion. A full timeout was performed. Preoperative antibiotics were given. The incision was injected with local anesthetic.  The incision was opened with a scalpel, then the soft tissues divided with the Bovie. Self-retaining retractors were placed. The prior implants from L3-S2 were identified and uncovered.  The soft tissues were removed from the posterior elements at L5-S1.  The locking caps were removed.  The rods were then lifted out of the tulip heads and the S1 and S2 screws were removed bilaterally.  The S2 tracts was palpated.  Using a pedicle finder  probe, it was slightly lengthened on both sides.  Using larger diameter screws, the S1 and S2 implants were replaced in the same tract at each level bilaterally.  NuVasive reline screws were used.  By placing screws across the sacroiliac joint, the termination of the construct was in the pelvis.  The rods were secured using locking caps to manufacturer's specifications. Please note that the rods secure the S1 and S2 implants superiorly in segmental fashion to the lumbar and thoracic pedicle screws up to T10. The wound was copiously irrigated, then the external surfaces of the remaining lamina, facet, and transverse processes from L5 to S1 were decorticated. A mixture of allograft and BMP was placed over the decorticated surfaces for arthrodesis.  A drain was placed subfascially.  A second drain was placed above the fascia.  After hemostasis, the wound was closed in layers with 0 and 2-0 vicryl. 3-0 monocryl and dermabond  was applied to the incision.  The patient was then flipped supine and extubated with incident. All counts were correct times 2 at the end of the case. No immediate complications were noted.  Edsel Goods PA assisted in the entire procedure. An assistant was required for this procedure due to the complexity.  The assistant provided assistance in tissue manipulation and suction, and was required for the successful and safe performance of the procedure. I performed the critical portions of the procedure.   [name] MD

## 2023-12-30 NOTE — H&P (Signed)
 REFERRING PHYSICIAN:  Clois Fret, Md [address] [place],  [place] [address]  DOS: 01/26/23 L2-3 XLIF, L3-4 TLIF, T12 and L1 and L1-2 decompression, T10 - pelvis PSF  HISTORY OF PRESENT ILLNESS:  12/30/2023 [name] presents today for surgical intervention.  11/26/2023 [name] is a 73 year old male with a history of spinal surgery who presents with continued back and leg pain.  He experiences persistent back pain radiating to the buttocks, hip, and down the leg, particularly during physical activities. The pain has worsened over the past month, with associated leg weakness and episodes of giving out, significantly limiting his ability to walk.  He manages pain with Tylenol . Recent imaging indicated screw loosening in his spine. Initially, there was some improvement post-surgery, but his condition has since deteriorated.  He has neuropathy, which may contribute to his leg pain. He has not pursued a spinal cord stimulator evaluation and is not interested in this option.  Current Meds  Medication Sig   amLODipine  (NORVASC ) 10 MG tablet Take 10 mg by mouth every morning.   ARIPiprazole  (ABILIFY ) 5 MG tablet Take 5 mg by mouth every morning.   Brimonidine  Tartrate (LUMIFY ) 0.025 % SOLN Place 1 drop into both eyes 2 (two) times daily.   celecoxib  (CELEBREX ) 200 MG capsule Take 1 capsule (200 mg total) by mouth 2 (two) times daily.   Cholecalciferol  (VITAMIN D-3 PO) Take 4,000 Units by mouth daily at 6 (six) AM.   clonazePAM  (KLONOPIN ) 0.5 MG tablet Take 0.5-1 mg by mouth 2 (two) times daily as needed for anxiety.   cyanocobalamin  2000 MCG tablet Take 2,000 mcg by mouth daily.   DULoxetine  (CYMBALTA ) 60 MG capsule 60 mg every morning.   fluticasone  (FLONASE ) 50 MCG/ACT nasal spray Place 2 sprays into both nostrils as needed for allergies or rhinitis.   furosemide  (LASIX ) 40 MG tablet Take 40 mg by mouth daily as needed for fluid.     ipratropium (ATROVENT ) 0.03 % nasal spray Place 2 sprays into both nostrils 2 (two) times daily as needed (Seasonal).   losartan  (COZAAR ) 25 MG tablet Take 25 mg by mouth daily.   Multiple Vitamin (MULTIVITAMIN WITH MINERALS) TABS tablet Take 1 tablet by mouth daily. 50+   senna (SENOKOT) 8.6 MG TABS tablet Take 1 tablet (8.6 mg total) by mouth daily as needed for mild constipation.   sodium fluoride  (FLUORISHIELD) 1.1 % GEL dental gel Place 1 application onto teeth daily.   spironolactone  (ALDACTONE ) 25 MG tablet Take 25 mg by mouth every morning.   Tadalafil 2.5 MG TABS Take 2.5 mg by mouth daily.   tamsulosin  (FLOMAX ) 0.4 MG CAPS capsule Take 2 capsules (0.8 mg total) by mouth daily. (Patient taking differently: Take 0.4 mg by mouth 2 (two) times daily.)   testosterone  cypionate (DEPOTESTOSTERONE CYPIONATE) 200 MG/ML injection ADMINISTER 0.5 ML IN THE MUSCLE 1 TIME A WEEK   traZODone  (DESYREL ) 100 MG tablet Take 100 mg by mouth at bedtime as needed for sleep.    Allergies  Allergen Reactions   Bupropion Other (See Comments)    Worsened depression   Gabapentin  Other (See Comments)    Severe shakiness   Lamotrigine Other (See Comments)    Unsteady on feet   Family History  Problem Relation Age of Onset   Arthritis Mother    Asthma Mother    Cancer Mother    Heart disease Father    Social History   Tobacco Use  Smoking status: Never   Smokeless tobacco: Never  Vaping Use   Vaping status: Never Used  Substance Use Topics   Alcohol  use: Yes    Alcohol /week: 1.0 standard drink of alcohol     Types: 1 Glasses of wine per week    Comment: 1-2 drinks every night   Drug use: No    ROS: 10 pt performed, pertinent findings in HPI   PHYSICAL EXAMINATION:  Vitals:   12/30/23 0909  BP: 130/87  Pulse: 73  Resp: 18  Temp: (!) 97 F (36.1 C)  SpO2: 93%    Heart sounds normal no MRG. Chest Clear to Auscultation Bilaterally.  General: Patient is well developed, well  nourished, calm, collected, and in no apparent distress.   NEUROLOGICAL:  General: In no acute distress.   Awake, alert, oriented to person, place, and time.  Pupils equal round and reactive to light.  Facial tone is symmetric.    Strength:            Side Iliopsoas Quads Hamstring PF DF EHL  R 5 5 5 5 5 5   L 5 5 5 5 5 5      ROS (Neurologic):  Negative except as noted above  IMAGING: CT L spine 10/19/2023 IMPRESSION: 1. Post myelogram CT. Status post spinal fusion hardware placement from T10 through the pelvis. Hardware remains intact. No acute osseous abnormality.   2. Evidence of developing or solid arthrodesis T10-T11 through L4-L5. 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   3. Previous posterior decompression T12-L1 through L5-S1 without residual spinal stenosis. No significant spinal stenosis T10-T11 through T12-L1.   4. 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 Borderline to mild spinal, severe bilateral T9 neural foraminal stenosis. No lower thoracic spinal cord or conus mass effect.   5.  Aortic Atherosclerosis (ICD10-I70.0).     Electronically Signed   By: VEAR Hurst M.D.   On: 11/18/2023 07:32   ASSESSMENT/PLAN:  [name] is doing worse  s/p above surgery.    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  He has chronic back pain with bilateral sciatica.  There is obvious loosening of his S1 and S2 screws suggesting implant failure. We will proceed with revision of his pseudoarthrosis.   [name] MD Department of neurosurgery

## 2023-12-30 NOTE — Plan of Care (Signed)
  Problem: Pain Management: Goal: Pain level will decrease Outcome: Progressing   Problem: Skin Integrity: Goal: Will show signs of wound healing Outcome: Progressing   

## 2023-12-31 NOTE — Evaluation (Signed)
 Occupational Therapy Evaluation Patient Details Name: [name] MRN: [id number] DOB: 02/08/1951 Today's Date: 12/31/2023   History of Present Illness   Pt is a 73 y.o. male presenting s/p L5/S1 fusion with removal and replacement of S1 and S2 screws. PMH includes multiple back surgeries/fusions, R TKA, L TKA, skin cancer, hepatitis C, GERD, gout, HTN, peripheral neuropathy, sleep apnea, and chronic LBP with sciatica.     Clinical Impressions Pt seen for OT evaluation this date, POD#1 from above back operation. Prior to hospital admission, pt was independent with mobility, most ADLs except toileting due to limited ROM allowing to twist his body. No falls in past 6 months. Pt lives with spouse in a single family home with 1 small step to enter and no handrails with spouse able to provide 24/7 assist/support as needed for pt. Currently pt is at [facility] progressing to supervision level with all aspects of mobility and MAX assist for LB ADL tasks. Pt reports his wife is very helpful and plans to assist him with all ADL tasks during his recovering. Pt eager to amb within the hallway to progress in household distances and community mobility goals, amb with RW+ CGA, no LOB noted, steady gait throughout. Pt able to recall 3/3 back precautions at the start of session. Pt educated in back precautions with handout provided, self care skills, bed mobility and functional transfer training, AE/DME for bathing, dressing, and toileting needs, and home/routines modifications and falls prevention strategies to maximize safety and functional independence while minimizing falls risk and maintaining precautions. Pt verbalized understanding of all education/training provided. Handout provided to support recall and carry over of learned precautions/techniques for bed mobility, functional transfers, and self care skills. Pt would benefit from skilled OT services to address noted impairments and functional limitations (see  below for any additional details) in order to maximize safety and independence while minimizing falls risk and caregiver burden. Do not anticipate the need for follow up OT services upon acute hospital DC. OT will follow acutely.     If plan is discharge home, recommend the following:   A little help with walking and/or transfers;A lot of help with bathing/dressing/bathroom;Assist for transportation;Help with stairs or ramp for entrance     Functional Status Assessment   Patient has had a recent decline in their functional status and demonstrates the ability to make significant improvements in function in a reasonable and predictable amount of time.     Equipment Recommendations   None recommended by OT     Recommendations for Other Services         Precautions/Restrictions   Precautions Precautions: Back Precaution Booklet Issued: Yes (comment) Recall of Precautions/Restrictions: Intact Required Braces or Orthoses: Spinal Brace Spinal Brace: Lumbar corset;Applied in sitting position;Other (comment) Spinal Brace Comments: Wear when OOB for comfort Restrictions Weight Bearing Restrictions Per Provider Order: No     Mobility Bed Mobility Overal bed mobility: Needs Assistance Bed Mobility: Sit to Sidelying, Rolling Rolling: Supervision       Sit to sidelying: Supervision General bed mobility comments: Verbal cues for carryover of log rolling technique to achieve sit<>supine with adherence to back precautions    Transfers Overall transfer level: Needs assistance Equipment used: Rolling walker (2 wheels) Transfers: Sit to/from Stand Sit to Stand: Contact guard assist           General transfer comment: CGA for safety, progressing to supervision      Balance Overall balance assessment: Needs assistance Sitting-balance support: Feet supported, No upper  extremity supported Sitting balance-Leahy Scale: Normal     Standing balance support: Bilateral  upper extremity supported, During functional activity, Reliant on assistive device for balance Standing balance-Leahy Scale: Fair                             ADL either performed or assessed with clinical judgement   ADL Overall ADL's : Needs assistance/impaired Eating/Feeding: Set up;Sitting   Grooming: Wash/dry hands;Sitting;Contact guard assist Grooming Details (indicate cue type and reason): sink level hand washing         Upper Body Dressing : Supervision/safety Upper Body Dressing Details (indicate cue type and reason): Doffed LSO in sitting Lower Body Dressing: Maximal assistance;Cueing for back precautions   Toilet Transfer: Contact guard assist;Ambulation;Rolling walker (2 wheels);Cueing for sequencing           Functional mobility during ADLs: Rolling walker (2 wheels);Contact guard assist General ADL Comments: CGA standing grooming tasks, setup for sitting tasks     Vision Baseline Vision/History: 1 Wears glasses                         Pertinent Vitals/Pain Pain Assessment Pain Assessment: Faces Faces Pain Scale: Hurts a little bit Pain Location: Low back (operative site) Pain Descriptors / Indicators: Constant, Operative site guarding, Sore Pain Intervention(s): Limited activity within patient's tolerance, Monitored during session, Repositioned     Extremity/Trunk Assessment Upper Extremity Assessment Upper Extremity Assessment: Overall WFL for tasks assessed   Lower Extremity Assessment Lower Extremity Assessment: Defer to PT evaluation   Cervical / Trunk Assessment Cervical / Trunk Assessment: Back Surgery   Communication Communication Communication: No apparent difficulties   Cognition Arousal: Alert Behavior During Therapy: WFL for tasks assessed/performed Cognition: No apparent impairments             OT - Cognition Comments: A/Ox4                 Following commands: Intact       Cueing  General  Comments   Cueing Techniques: Verbal cues;Gestural cues  Dried blood noted through honey comb dressing - RN aware   Exercises Exercises: Other exercises Other Exercises Other Exercises: Edu: Review of back precautions, safe ADL completion, AD to utilize if wife isnt around for LB dressing, transfer technqiues, DME management during ADL tasks          Home Living Family/patient expects to be discharged to:: Private residence Living Arrangements: Spouse/significant other Available Help at Discharge: Family;Available 24 hours/day Type of Home: House Home Access: Stairs to enter [employer] of Steps: 1 Entrance Stairs-Rails: None Home Layout: One level     Bathroom Shower/Tub: Producer, television/film/video: Handicapped height Bathroom Accessibility: Yes How Accessible: Accessible via walker Home Equipment: Cane - single point;Rollator (4 wheels);Hand held shower head;Shower seat - built in;Grab bars - tub/shower          Prior Functioning/Environment Prior Level of Function : Driving;Working/employed;Independent/Modified Independent             Mobility Comments: No DME use for amb PTA ADLs Comments: Assist with pericare via bidet due to inability to twist at baseline, if bidet no available wife provides pericare    OT Problem List: Decreased strength;Decreased activity tolerance;Impaired balance (sitting and/or standing);Decreased knowledge of use of DME or AE;Decreased safety awareness   OT Treatment/Interventions: Self-care/ADL training;Therapeutic exercise;Energy conservation;DME and/or AE instruction;Therapeutic activities;Patient/family education;Balance training  OT Goals(Current goals can be found in the care plan section)   Acute Rehab OT Goals Patient Stated Goal: Return to normal life with less pain OT Goal Formulation: With patient Time For Goal Achievement: 01/14/24 Potential to Achieve Goals: Good ADL Goals Pt Will Perform Grooming:  with modified independence;standing Pt Will Perform Lower Body Dressing: sit to/from stand;with caregiver independent in assisting;with adaptive equipment Pt Will Transfer to Toilet: regular height toilet;ambulating;with modified independence   OT Frequency:  Min 2X/week    Co-evaluation              AM-PAC OT 6 Clicks Daily Activity     Outcome Measure Help from another person eating meals?: A Little Help from another person taking care of personal grooming?: A Little Help from another person toileting, which includes using toliet, bedpan, or urinal?: A Lot Help from another person bathing (including washing, rinsing, drying)?: A Lot Help from another person to put on and taking off regular upper body clothing?: None Help from another person to put on and taking off regular lower body clothing?: A Lot 6 Click Score: 16   End of Session Equipment Utilized During Treatment: Gait belt;Rolling walker (2 wheels);Back brace Nurse Communication: Mobility status;Other (comment) (Blood at operative site)  Activity Tolerance: Patient tolerated treatment well Patient left: in bed;with call bell/phone within reach;with nursing/sitter in room  OT Visit Diagnosis: Unsteadiness on feet (R26.81);Other abnormalities of gait and mobility (R26.89)                Time: 9141-9074 OT Time Calculation (min): 27 min Charges:  OT General Charges $OT Visit: 1 Visit OT Evaluation $OT Eval Low Complexity: 1 Low OT Treatments $Self Care/Home Management : 8-22 mins  Larraine Colas M.S. OTR/L  12/31/23, 10:20 AM

## 2023-12-31 NOTE — Plan of Care (Signed)
 Progressing towards discharge

## 2023-12-31 NOTE — TOC Initial Note (Signed)
 Transition of Care [facility]) - Initial/Assessment Note    Patient Details  Name: [name] MRN: [id number] Date of Birth: 1950/11/10  [facility] of [facility]) CM/SW Contact:    Alvaro Louder, LCSW Phone Number: 12/31/2023, 11:23 AM  Clinical Narrative:  LCSWA met with patient and spouse Elenor at bedside. The patient indicated that he dressed himself prior to discharge. He stated that he had no equipment that he used to ambulate, but has a built in Paediatric nurse. The patient indicated that his spouse will take him to appointments and help him after discharge. He reported that his medications are affordable and his wife would take him home after discharge.  TOC to follow for discharge               Expected Discharge Plan:  (TBD) Barriers to Discharge: Continued Medical Work up   Patient Goals and CMS Choice            Expected Discharge Plan and Services                                              Prior Living Arrangements/Services     Patient language and need for interpreter reviewed:: Yes Do you feel safe going back to the place where you live?: Yes      Need for Family Participation in Patient Care: Yes (Comment) Care giver support system in place?: Yes (comment)   Criminal Activity/Legal Involvement Pertinent to Current Situation/Hospitalization: No - Comment as needed  Activities of Daily Living   ADL Screening (condition at time of admission) Independently performs ADLs?: Yes (appropriate for developmental age) Is the patient deaf or have difficulty hearing?: No Does the patient have difficulty seeing, even when wearing glasses/contacts?: No Does the patient have difficulty concentrating, remembering, or making decisions?: No  Permission Sought/Granted Permission sought to share information with : Case Manager                Emotional Assessment Appearance:: Appears stated age Attitude/Demeanor/Rapport: Engaged Affect (typically  observed): Appropriate Orientation: : Oriented to Self, Oriented to Place, Oriented to  Time, Oriented to Situation      Admission diagnosis:  Failed orthopedic implant, sequela [T84.498S] Chronic bilateral low back pain with bilateral sciatica [M54.42, M54.41, G89.29] Pseudarthrosis after fusion or arthrodesis [M96.0] S/P lumbar fusion [Z98.1] Patient Active Problem List   Diagnosis Date Noted   Failed orthopedic implant (HCC) 12/30/2023   Pseudarthrosis after fusion or arthrodesis 12/30/2023   S/P lumbar fusion 12/30/2023   S/P spinal fusion 01/26/2023   Pseudarthrosis following spinal fusion 01/26/2023   Retrolisthesis 01/26/2023   Chronic bilateral low back pain with bilateral sciatica 01/26/2023   Lumbar adjacent segment disease with spondylolisthesis 01/26/2023   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   Pulmonary nodule seen on imaging study 06/05/2022   S/P right knee arthroscopy 08/28/2021   Chronic pain disorder 10/18/2020   Leukopenia 03/08/2020   Vitamin B12 deficiency 03/08/2020   Vitamin D deficiency 03/08/2020   Short-term memory loss 02/29/2020   S/P total knee arthroplasty, left 02/29/2020   Primary osteoarthritis of right knee 12/18/2019   Skin infection 09/19/2019   Hypogonadism in male 01/06/2019   Bilateral sciatica 11/12/2017   Anxiety and depression 11/05/2017   Obesity (BMI 30.0-34.9) 07/17/2017   Nephrolithiasis 06/10/2017   Hydronephrosis with urinary obstruction due to ureteral calculus 09/30/2016  Personal history of other infectious and parasitic diseases 12/14/2015   History of hepatitis C virus infection 12/14/2015   Olecranon bursitis of left elbow 03/13/2015   Other infective bursitis, left elbow 01/23/2015   Difficulty in swallowing    Gastritis    Ectopic gastric tissue    Hx of colonic polyps    Benign neoplasm of cecum    First degree hemorrhoids    Benign essential HTN 12/07/2014   HCV (hepatitis C virus) 12/07/2014   Apnea,  sleep 12/07/2014   Spinal stenosis 12/07/2014   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   Skin cancer 12/07/2014   Secondary erythrocytosis 11/28/2014   DDD (degenerative disc disease), lumbar 11/06/2014   DDD (degenerative disc disease), cervical 11/06/2014   Status post cervical spinal fusion 11/06/2014   Status post lumbar laminectomy 11/06/2014   Sacroiliac joint dysfunction 11/06/2014   Encounter for long-term (current) use of medications 10/19/2013   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/31/2013   Incomplete emptying of bladder 03/31/2013   Retention of urine, unspecified 03/31/2013   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   Idiopathic scoliosis and kyphoscoliosis 03/01/2013   Elevated glucose 12/10/2011   Prediabetes 12/10/2011   Testicular dysfunction 11/04/2011   PCP:  Steva Clotilda DEL, NP Pharmacy:   [facility] - [place], [place] - [address] [place] [address] Phone: [phone] Fax: [phone]     Social Drivers of Health (SDOH) Social History: SDOH Screenings   Food Insecurity: No Food Insecurity (12/30/2023)  Housing: Low Risk  (12/30/2023)  Transportation Needs: No Transportation Needs (12/30/2023)  Utilities: Not At Risk (12/30/2023)  Financial Resource Strain: Low Risk  (06/30/2023)   Received from [facility]  Social Connections: Socially Isolated (12/30/2023)  Tobacco Use: Low Risk  (12/30/2023)  Recent Concern: Tobacco Use - Medium Risk (12/22/2023)   Received from [facility]   SDOH Interventions:     Readmission Risk Interventions     No data to display

## 2023-12-31 NOTE — Evaluation (Addendum)
 Physical Therapy Evaluation Patient Details Name: [name] MRN: [id number] DOB: [birth year] Today's Date: 12/31/2023  History of Present Illness  Pt is a 73 y.o. male presenting s/p L5/S1 fusion with removal and replacement of S1 and S2 screws. PMH includes multiple back surgeries/fusions, R TKA, L TKA, skin cancer, hepatitis C, GERD, gout, HTN, peripheral neuropathy, sleep apnea, and chronic LBP with sciatica.  Clinical Impression  Pt was pleasant and motivated to participate during the session and put forth good effort throughout. Pt required VC's for hand placement before initiating STS and for sequencing when ascending/descending stairs, but remained steady throughout ambulation and stair navigation and no LOBs occurred. Pt was educated on log rolling technique for bed mobility with good pt carryover. Pt reported no adverse symptoms during the session other than LBP with SpO2 and HR WNL throughout on room air. Will continue to follow up with pt during admission per surgical protocol.            If plan is discharge home, recommend the following: A little help with walking and/or transfers;A little help with bathing/dressing/bathroom;Assistance with cooking/housework;Assist for transportation;Help with stairs or ramp for entrance   Can travel by private vehicle        Equipment Recommendations None recommended by PT  Recommendations for Other Services       Functional Status Assessment Patient has had a recent decline in their functional status and demonstrates the ability to make significant improvements in function in a reasonable and predictable amount of time.     Precautions / Restrictions Precautions Precautions: None Required Braces or Orthoses: Spinal Brace Spinal Brace: Lumbar corset;Applied in sitting position;Other (comment) Spinal Brace Comments: Wear when OOB Restrictions Weight Bearing Restrictions Per Provider Order: No      Mobility  Bed  Mobility Overal bed mobility: Modified Independent             General bed mobility comments: Pt required VC's for log rolling technique and use of bedrails, but no physical assistance required    Transfers Overall transfer level: Needs assistance Equipment used: Rolling walker (2 wheels) Transfers: Sit to/from Stand Sit to Stand: Contact guard assist           General transfer comment: Pt required CGA and VC's for hand placement to complete STS from EOB with RW, but remained steady throughout and no LOBs occured.    Ambulation/Gait Ambulation/Gait assistance: Contact guard assist Gait Distance (Feet): 175 Feet Assistive device: Rolling walker (2 wheels) Gait Pattern/deviations: Step-through pattern, Decreased step length - right, Decreased step length - left       General Gait Details: Pt demonstrated decreased step length bilaterally during ambulation with RW, but remained steady throughout and no LOBs occured  Stairs Stairs: Yes Stairs assistance: Contact guard assist Stair Management: No rails Number of Stairs: 2 General stair comments: Pt required VC's for which LE to lead with when ascending/descending stairs, but remained steady throughout and no LOBs occured  Wheelchair Mobility     Tilt Bed    Modified Rankin (Stroke Patients Only)       Balance Overall balance assessment: Needs assistance Sitting-balance support: Feet supported, No upper extremity supported Sitting balance-Leahy Scale: Normal     Standing balance support: Bilateral upper extremity supported, During functional activity, Reliant on assistive device for balance Standing balance-Leahy Scale: Fair Standing balance comment: Pt reliant on RW to maintain balance in stance and ambulation, but remained steady throughout and no LOBs occured  Pertinent Vitals/Pain Pain Assessment Pain Assessment: 0-10 Pain Score: 4  Pain Location: Low back Pain  Descriptors / Indicators: Constant, Operative site guarding, Sore Pain Intervention(s): Monitored during session, Premedicated before session    Home Living Family/patient expects to be discharged to:: Private residence Living Arrangements: Spouse/significant other Available Help at Discharge: Family;Available 24 hours/day Type of Home: House Home Access: Stairs to enter Entrance Stairs-Rails: None Entrance Stairs-Number of Steps: 1   Home Layout: One level Home Equipment: Cane - single point;Rollator (4 wheels);Hand held shower head;Shower seat - built in;Grab bars - tub/shower      Prior Function Prior Level of Function : Driving;Working/employed;Independent/Modified Independent             Mobility Comments: No DME use for amb PTA ADLs Comments: Assist with pericare via bidet due to inability to twist at baseline, if bidet no available wife provides pericare     Extremity/Trunk Assessment   Upper Extremity Assessment Upper Extremity Assessment: Overall WFL for tasks assessed    Lower Extremity Assessment Lower Extremity Assessment: Defer to PT evaluation    Cervical / Trunk Assessment Cervical / Trunk Assessment: Back Surgery  Communication   Communication Communication: No apparent difficulties    Cognition Arousal: Alert Behavior During Therapy: WFL for tasks assessed/performed   PT - Cognitive impairments: No apparent impairments                         Following commands: Intact       Cueing Cueing Techniques: Verbal cues, Gestural cues     General Comments General comments (skin integrity, edema, etc.): Dried blood noted through honey comb dressing - RN aware    Exercises     Assessment/Plan    PT Assessment Patient needs continued PT services  PT Problem List Decreased strength;Decreased mobility;Decreased range of motion;Pain       PT Treatment Interventions DME instruction;Therapeutic exercise;Gait training;Balance  training;Stair training;Functional mobility training;Therapeutic activities;Patient/family education    PT Goals (Current goals can be found in the Care Plan section)  Acute Rehab PT Goals Patient Stated Goal: to be able to ambulate without leg/back pain PT Goal Formulation: With patient Time For Goal Achievement: 01/13/24 Potential to Achieve Goals: Good    Frequency 7X/week     Co-evaluation               AM-PAC PT 6 Clicks Mobility  Outcome Measure Help needed turning from your back to your side while in a flat bed without using bedrails?: A Little Help needed moving from lying on your back to sitting on the side of a flat bed without using bedrails?: A Little Help needed moving to and from a bed to a chair (including a wheelchair)?: A Little Help needed standing up from a chair using your arms (e.g., wheelchair or bedside chair)?: A Little Help needed to walk in hospital room?: A Little Help needed climbing 3-5 steps with a railing? : A Little 6 Click Score: 18    End of Session Equipment Utilized During Treatment: Gait belt Activity Tolerance: Patient tolerated treatment well Patient left: in chair;with call bell/phone within reach Nurse Communication: Mobility status PT Visit Diagnosis: Other abnormalities of gait and mobility (R26.89);Muscle weakness (generalized) (M62.81);Pain Pain - Right/Left:  (low back)    Time: 9185-9159 PT Time Calculation (min) (ACUTE ONLY): 26 min   Charges:                 Leontine Ingles, SPT  12/31/23, 10:44 AM This entire session was performed under direct supervision and direction of a licensed therapist/therapist assistant. I have personally read, edited and approve of the note as written.  Carmin Deed, DPT

## 2023-12-31 NOTE — Anesthesia Postprocedure Evaluation (Signed)
 Anesthesia Post Note  Patient: [name]  Procedure(s) Performed: REVISION OF L5-S1 Posterior FUSION (Spine Lumbar)  Patient location during evaluation: PACU Anesthesia Type: General Level of consciousness: awake and alert Pain management: pain level controlled Vital Signs Assessment: post-procedure vital signs reviewed and stable Respiratory status: spontaneous breathing, nonlabored ventilation and respiratory function stable Cardiovascular status: blood pressure returned to baseline and stable Postop Assessment: no apparent nausea or vomiting Anesthetic complications: no   There were no known notable events for this encounter.   Last Vitals:  Vitals:   12/31/23 0355 12/31/23 0757  BP: 114/78 121/76  Pulse: 71 75  Resp: 16 17  Temp: 36.7 C 36.7 C  SpO2: 94% 94%    Last Pain:  Vitals:   12/31/23 0757  TempSrc: Oral  PainSc: 4                  Camellia Merilee Louder

## 2023-12-31 NOTE — Progress Notes (Signed)
   Neurosurgery Progress Note  History: TAVONE CAESAR is s/p L5/S1 fusion with removal and replacement of S1 and S2 screws.   POD1: Pt is doing well this morning and feels his pain is well controlled on current medication regimen  Physical Exam: Vitals:   12/30/23 2309 12/31/23 0355  BP: 105/65 114/78  Pulse: 62 71  Resp: 16 16  Temp: (!) 97.3 F (36.3 C) 98 F (36.7 C)  SpO2: 93% 94%    AA Ox3 CNI HV #1 245. #2 330 Strength:5/5 throughout BLE  Data:  Other tests/results: see results review  Assessment/Plan:  ELDAR ROBITAILLE is s/p L5/S1 fusion with removal and replacement of S1 and S2 screws for pseudoarthrosis  - mobilize - pain control - DVT prophylaxis - PTOT; brace when OOB and ambulating.   Edsel Goods PA-C Department of Neurosurgery

## 2023-12-31 NOTE — Plan of Care (Signed)
  Problem: Clinical Measurements: Goal: Will remain free from infection Outcome: Progressing   Problem: Education: Goal: Ability to verbalize activity precautions or restrictions will improve Outcome: Progressing   Problem: Activity: Goal: Ability to avoid complications of mobility impairment will improve Outcome: Progressing   Problem: Bowel/Gastric: Goal: Gastrointestinal status for postoperative course will improve Outcome: Progressing

## 2024-01-01 ENCOUNTER — Other Ambulatory Visit: Payer: Self-pay

## 2024-01-01 ENCOUNTER — Encounter: Payer: Self-pay | Admitting: Internal Medicine

## 2024-01-01 LAB — CBC
HCT: 38 % — ABNORMAL LOW (ref 39.0–52.0)
Hemoglobin: 12.8 g/dL — ABNORMAL LOW (ref 13.0–17.0)
MCH: 31.3 pg (ref 26.0–34.0)
MCHC: 33.7 g/dL (ref 30.0–36.0)
MCV: 92.9 fL (ref 80.0–100.0)
Platelets: 133 K/uL — ABNORMAL LOW (ref 150–400)
RBC: 4.09 MIL/uL — ABNORMAL LOW (ref 4.22–5.81)
RDW: 12.9 % (ref 11.5–15.5)
WBC: 7.8 K/uL (ref 4.0–10.5)
nRBC: 0 % (ref 0.0–0.2)

## 2024-01-01 MED ORDER — METHOCARBAMOL 500 MG PO TABS
500.0000 mg | ORAL_TABLET | Freq: Four times a day (QID) | ORAL | 0 refills | Status: DC | PRN
  Filled 2024-01-01: qty 120, 30d supply, fill #0

## 2024-01-01 MED ORDER — SENNA 8.6 MG PO TABS
1.0000 | ORAL_TABLET | Freq: Two times a day (BID) | ORAL | 0 refills | Status: AC | PRN
  Filled 2024-01-01: qty 60, 30d supply, fill #0

## 2024-01-01 MED ORDER — OXYCODONE HCL 5 MG PO TABS
5.0000 mg | ORAL_TABLET | ORAL | 0 refills | Status: DC | PRN
  Filled 2024-01-01: qty 45, 4d supply, fill #0

## 2024-01-01 NOTE — Progress Notes (Signed)
   Neurosurgery Progress Note  History: LUNDEN MCLEISH is s/p L5/S1 fusion with removal and replacement of S1 and S2 screws.   POD2: Reports a better night. Is eager to go home  POD1: Pt is doing well this morning and feels his pain is well controlled on current medication regimen  Physical Exam: Vitals:   12/31/23 2334 01/01/24 0500  BP: 104/67 110/65  Pulse: 61 62  Resp: 18 16  Temp: (!) 97.3 F (36.3 C) 98 F (36.7 C)  SpO2: 94% 96%    AA Ox3 CNI HV #1 295 #2 75 Strength:5/5 throughout BLE  Data:  Other tests/results: see results review  Assessment/Plan:  Elspeth LITTIE Bowers is s/p L5/S1 fusion with removal and replacement of S1 and S2 screws for pseudoarthrosis  - mobilize - pain control - DVT prophylaxis - PTOT; brace when OOB and ambulating.  - will remove #2 drain this morning. Pt is eager to go home. We will keep the deep drain and remove on an outpatient basis  Edsel Goods PA-C Department of Neurosurgery

## 2024-01-01 NOTE — Plan of Care (Signed)
 Patient ready for discharge home.  Discharge instructions printed and reviewed with patient and spouse.

## 2024-01-01 NOTE — Progress Notes (Signed)
 Physical Therapy Treatment Patient Details Name: [name] MRN: [id number] DOB: 28-Jun-1950 Today's Date: 01/01/2024   History of Present Illness Pt is a 73 y.o. male presenting s/p L5/S1 fusion with removal and replacement of S1 and S2 screws. PMH includes multiple back surgeries/fusions, R TKA, L TKA, skin cancer, hepatitis C, GERD, gout, HTN, peripheral neuropathy, sleep apnea, and chronic LBP with sciatica.    PT Comments  Pt was pleasant and motivated to participate during the session and put forth good effort throughout. Pt required VC's for hand placement before initiating log rolling technique. Pt required CGA for ambulation with RW and stair navigation without use of RW or rails. Pt remained steady throughout all mobility tasks and no LOBs occurred. Pt reported no adverse symptoms during the session other than LBP with SpO2 and HR WNL throughout on room air. Pt will benefit from continued PT services upon discharge to safely address deficits listed in patient problem list for decreased caregiver assistance and eventual return to PLOF.   If plan is discharge home, recommend the following: A little help with walking and/or transfers;A little help with bathing/dressing/bathroom;Assistance with cooking/housework;Assist for transportation;Help with stairs or ramp for entrance   Can travel by private vehicle        Equipment Recommendations  None recommended by PT    Recommendations for Other Services       Precautions / Restrictions Precautions Precautions: Back Precaution Booklet Issued: Yes (comment) Recall of Precautions/Restrictions: Intact Required Braces or Orthoses: Spinal Brace Spinal Brace: Lumbar corset;Applied in sitting position;Other (comment) Spinal Brace Comments: Wear when OOB for comfort Restrictions Weight Bearing Restrictions Per Provider Order: No     Mobility  Bed Mobility Overal bed mobility: Needs Assistance Bed Mobility: Rolling, Supine to Sit      Supine to sit: Supervision     General bed mobility comments: Pt required VC's for hand placement to initiate log rolling technique and used bedrails to come to sitting EOB from supine, but no physical assistance required    Transfers Overall transfer level: Needs assistance Equipment used: Rolling walker (2 wheels) Transfers: Sit to/from Stand Sit to Stand: Contact guard assist           General transfer comment: Pt required CGA for STS from EOB with RW, but remained steady throughout and no LOBs occured    Ambulation/Gait Ambulation/Gait assistance: Contact guard assist Gait Distance (Feet): 175 Feet Assistive device: Rolling walker (2 wheels) Gait Pattern/deviations: Step-through pattern, Decreased step length - right, Decreased step length - left       General Gait Details: Pt demonstrated decreased step length bilaterally during ambulation with RW, but remained steady throughout and no LOBs occured. Minimal VC's required to maintain RW within BOS with good pt carryover.   Stairs   Stairs assistance: Contact guard assist Stair Management: No rails Number of Stairs: 3 General stair comments: Pt required CGA for reciprocally ascending/descending stairs with no rails, but remained steady throughout and no LOBs occured   Wheelchair Mobility     Tilt Bed    Modified Rankin (Stroke Patients Only)       Balance Overall balance assessment: Needs assistance Sitting-balance support: Feet supported, No upper extremity supported Sitting balance-Leahy Scale: Normal     Standing balance support: Bilateral upper extremity supported, During functional activity, Reliant on assistive device for balance Standing balance-Leahy Scale: Fair Standing balance comment: Pt reliant on RW to maintain balance in stance and ambulation, but remained steady throughout and no LOBs occured  Communication Communication Communication: No apparent  difficulties  Cognition Arousal: Alert Behavior During Therapy: WFL for tasks assessed/performed   PT - Cognitive impairments: No apparent impairments                         Following commands: Intact      Cueing Cueing Techniques: Verbal cues, Gestural cues  Exercises      General Comments  General back precaution education and car transfer sequencing provided      Pertinent Vitals/Pain Pain Assessment Pain Assessment: 0-10 Pain Score: 4  Pain Location: Low back (operative site) Pain Descriptors / Indicators: Constant, Operative site guarding, Sore Pain Intervention(s): Monitored during session    Home Living                          Prior Function            PT Goals (current goals can now be found in the care plan section) Progress towards PT goals: Progressing toward goals    Frequency    7X/week      PT Plan      Co-evaluation              AM-PAC PT 6 Clicks Mobility   Outcome Measure  Help needed turning from your back to your side while in a flat bed without using bedrails?: A Little Help needed moving from lying on your back to sitting on the side of a flat bed without using bedrails?: A Little Help needed moving to and from a bed to a chair (including a wheelchair)?: A Little Help needed standing up from a chair using your arms (e.g., wheelchair or bedside chair)?: A Little Help needed to walk in hospital room?: A Little Help needed climbing 3-5 steps with a railing? : A Little 6 Click Score: 18    End of Session Equipment Utilized During Treatment: Gait belt Activity Tolerance: Patient tolerated treatment well Patient left: in chair;with call bell/phone within reach;with family/visitor present Nurse Communication: Mobility status PT Visit Diagnosis: Other abnormalities of gait and mobility (R26.89);Muscle weakness (generalized) (M62.81);Pain Pain - Right/Left:  (low back)     Time: 9095-9072 PT Time  Calculation (min) (ACUTE ONLY): 23 min  Charges:                            Leontine Ingles, SPT 01/01/24, 12:01 PM

## 2024-01-01 NOTE — Discharge Summary (Signed)
 Discharge Summary  Patient ID: [name] MRN: [id number] DOB/AGE: December 25, 1950 73 y.o.  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 His intraoperative course was uncomplicated. He was admitted for therapy evaluation and drain output monitoring. He was deemed appropriate for discharge home. His drain output remained high and therefore we decided to remove one of his drains and see him outpatient to remove the second. He was discharged home with prescriptions for Oxycodone , Robaxin ,and Senna to take as needed  Consults: NA  Significant Diagnostic Studies: NA  Treatments: surgery: As above. Please see separately dictated operative report for further details  Discharge Exam: Blood pressure 132/81, pulse 62, temperature (!) 97.4 F (36.3 C), resp. rate 16, height 5' 8 (1.727 m), weight 105.7 kg, SpO2 94%. AA Ox3 CNI Strength:5/5 throughout BLE Incisions with dressings in place. Right HV removed. Left remains in place Disposition: Discharge disposition: 01-Home or Self Care       Discharge Instructions     No dressing needed   Complete by: As directed       Allergies as of 01/01/2024       Reactions   Bupropion Other (See Comments)   Worsened depression   Gabapentin  Other (See Comments)   Severe shakiness   Lamotrigine Other (See Comments)   Unsteady on feet        Medication List     TAKE these medications    amLODipine   10 MG tablet Commonly known as: NORVASC  Take 10 mg by mouth every morning.   ARIPiprazole  5 MG tablet Commonly known as: ABILIFY  Take 5 mg by mouth every morning.   celecoxib  200 MG capsule Commonly known as: CELEBREX  Take 1 capsule (200 mg total) by mouth 2 (two) times daily.   clonazePAM  0.5 MG tablet Commonly known as: KLONOPIN  Take 0.5-1 mg by mouth 2 (two) times daily as needed for anxiety.   cyanocobalamin  2000 MCG tablet Take 2,000 mcg by mouth daily.   DULoxetine  60 MG capsule Commonly known as: CYMBALTA  60 mg every morning.   fluticasone  50 MCG/ACT nasal spray Commonly known as: FLONASE  Place 2 sprays into both nostrils as needed for allergies or rhinitis.   furosemide  40 MG tablet Commonly known as: LASIX  Take 40 mg by mouth daily as needed for fluid.   ipratropium 0.03 % nasal spray Commonly known as: ATROVENT  Place 2 sprays into both nostrils 2 (two) times daily as needed (Seasonal).   losartan  25 MG tablet Commonly known as: COZAAR  Take 25 mg by mouth daily.   Lumify  0.025 % Soln Generic drug: Brimonidine  Tartrate Place 1 drop into both eyes 2 (two) times daily.   methocarbamol  500 MG tablet Commonly known as: ROBAXIN  Take 1 tablet (500 mg total) by mouth every 6 (six) hours as needed for muscle spasms.   multivitamin with minerals Tabs tablet Take 1 tablet by mouth daily. 50+   NEEDLE (DISP) 23 G 23G X 1 Misc Use 1 Units every 10 (ten) days.   oxyCODONE  5 MG immediate release  tablet Commonly known as: Oxy IR/ROXICODONE  Take 1-2 tablets (5-10 mg total) by mouth every 4 (four) hours as needed for moderate pain (pain score 4-6).   senna 8.6 MG Tabs tablet Commonly known as: SENOKOT Take 1 tablet (8.6 mg total) by mouth 2 (two) times daily as needed for mild constipation. What changed: when to take this   sodium fluoride  1.1 % Gel dental gel Commonly known as: FLUORISHIELD Place 1 application onto teeth daily.   spironolactone  25 MG  tablet Commonly known as: ALDACTONE  Take 25 mg by mouth every morning.   Tadalafil 2.5 MG Tabs Take 2.5 mg by mouth daily.   tamsulosin  0.4 MG Caps capsule Commonly known as: FLOMAX  Take 2 capsules (0.8 mg total) by mouth daily. What changed:  how much to take when to take this   testosterone  cypionate 200 MG/ML injection Commonly known as: DEPOTESTOSTERONE CYPIONATE ADMINISTER 0.5 ML IN THE MUSCLE 1 TIME A WEEK   traZODone  100 MG tablet Commonly known as: DESYREL  Take 100 mg by mouth at bedtime as needed for sleep.   VITAMIN D-3 PO Take 4,000 Units by mouth daily at 6 (six) AM.               Discharge Care Instructions  (From admission, onward)           Start     Ordered   01/01/24 0000  No dressing needed        01/01/24 1037             Signed: Edsel Jama Goods 01/01/2024, 10:37 AM

## 2024-01-04 ENCOUNTER — Ambulatory Visit: Admitting: Neurosurgery

## 2024-01-04 ENCOUNTER — Ambulatory Visit (INDEPENDENT_AMBULATORY_CARE_PROVIDER_SITE_OTHER): Admitting: Neurosurgery

## 2024-01-04 ENCOUNTER — Encounter: Payer: Self-pay | Admitting: Neurosurgery

## 2024-01-04 VITALS — BP 118/86 | Temp 98.1°F

## 2024-01-04 DIAGNOSIS — Z981 Arthrodesis status: Secondary | ICD-10-CM

## 2024-01-04 DIAGNOSIS — M5442 Lumbago with sciatica, left side: Secondary | ICD-10-CM

## 2024-01-04 NOTE — Progress Notes (Signed)
   REFERRING PHYSICIAN:  Steva Clotilda DEL, [address] [place],  [place] [address]  DOS: 12/30/23 L5/S1 fusion with removal and replacement of S1 and S2 screws.   HISTORY OF PRESENT ILLNESS: BROADY Barajas is approximately 5 days status post above lumbar fusion revision. he is doing well.  He presents today for drain removal. He reports significant improvement of his pain and feels he is walking better. His drain output has been very minimal.   PHYSICAL EXAMINATION:  General: Patient is well developed, well nourished, calm, collected, and in no apparent distress.   NEUROLOGICAL:  General: In no acute distress.   Awake, alert, oriented to person, place, and time.  Pupils equal round and reactive to light.    Strength:            Side Iliopsoas Quads Hamstring PF DF EHL  R 5 5 5 5 5 5   L 5 5 5 5 5 5    Incisions c/d/I with staples in place   ROS (Neurologic):  Negative except as noted above  IMAGING: No interval imaging to review  ASSESSMENT/PLAN:  [name] is doing well approximately 5 days after above surgery. The purpose of todays visit was for drain removal. The drain was removed without consequence. We will see him back in about a week and a half at his regularly scheduled visit for staple removal.   Advised to contact the office if any questions or concerns arise.  Jorge Goods PA-C Department of neurosurgery

## 2024-01-06 ENCOUNTER — Encounter: Payer: Self-pay | Admitting: Neurosurgery

## 2024-01-11 ENCOUNTER — Telehealth: Payer: Self-pay | Admitting: Neurosurgery

## 2024-01-11 NOTE — Telephone Encounter (Signed)
 I spoke with his wife. She is unsure exactly what he is taking and she is not home at the moment.   I then spoke with [name]. He reports right buttock pain and into his hamstring. This started on 01/06/24. He denies new weakness or bowel/bladder issues.  He is still taking methocarbamol  1-2 times per day, but doesn't notice a lot of relief with them.  He is also taking celebrex  200mg  twice per day, duloxetine  60mg  every morning.  He ran out of oxycodone  today and would like a refill sent to [facility] in [place].

## 2024-01-11 NOTE — Telephone Encounter (Signed)
 Attempted to call patient no answer

## 2024-01-11 NOTE — Telephone Encounter (Signed)
 Patient's wife states that her husband was last seen on Monday, 7.28.25 and starting Wednesday, 7.30.25 he has been having pain in his right hip making it harder for him to walk and do daily activities. He is also out of pain medication. They are not too sure what to do or what will help, please advise.

## 2024-01-12 MED ORDER — OXYCODONE HCL 5 MG PO TABS: 5.0000 mg | ORAL_TABLET | ORAL | 0 refills | Status: DC | PRN

## 2024-01-12 NOTE — Telephone Encounter (Signed)
 I notified Mr [name] that [name] will see him as scheduled on Thursday and that she may order xrays after his visit.

## 2024-01-12 NOTE — Telephone Encounter (Addendum)
 I notified him of the refill. He said it is just as bad as before surgery and the right buttock and hamstring is the same location as before surgery.

## 2024-01-14 ENCOUNTER — Encounter: Admitting: Neurosurgery

## 2024-01-14 ENCOUNTER — Ambulatory Visit (INDEPENDENT_AMBULATORY_CARE_PROVIDER_SITE_OTHER): Admitting: Neurosurgery

## 2024-01-14 ENCOUNTER — Encounter: Payer: Self-pay | Admitting: Neurosurgery

## 2024-01-14 VITALS — BP 124/80 | Temp 98.2°F | Ht 68.0 in | Wt 236.2 lb

## 2024-01-14 DIAGNOSIS — M5442 Lumbago with sciatica, left side: Secondary | ICD-10-CM

## 2024-01-14 DIAGNOSIS — Z981 Arthrodesis status: Secondary | ICD-10-CM

## 2024-01-14 NOTE — Progress Notes (Signed)
   REFERRING PHYSICIAN:  Steva Clotilda DEL, [address] [place],  [place] [address]  DOS: 12/30/23 L5-S1 fusion. Removal and replacement of S1 and S2 screws.   HISTORY OF PRESENT ILLNESS: [name] is approximately 2 weeks status post above surgery. he was doing well until about 1 week ago when he had recurrent low back and right buttock and thigh pain similar to pre-op. He attributes this to increasing his activity too quickly He is currently taking 10mg  of Oxycodone  2-3 times per day and Robaxin  1-2 times per day. He denies any drainage from his incision   PHYSICAL EXAMINATION:  General: Patient is well developed, well nourished, calm, collected, and in no apparent distress.   NEUROLOGICAL:  General: In no acute distress.   Awake, alert, oriented to person, place, and time.  Pupils equal round and reactive to light.  Facial tone is symmetric.  Tongue protrusion is midline.  There is no pronator drift.   Strength:            Side Iliopsoas Quads Hamstring PF DF EHL  R 5 5 5 5 5 5   L 5 5 5 5 5 5    Incision c/d/I and healing well. There is some fluid deep to the incision but not drainage from this incision or other signs of infection   ROS (Neurologic):  Negative except as noted above  IMAGING: No interval images to review  ASSESSMENT/PLAN:  [name] is doing fair approximately 2 weeks after the above surgery. We discussed getting his xrays sooner however he would like to hold off and see if things improve with activity modifications. I recommended that he continue with activity restrictions as discussed. He will try increasing his Robaxin  to 1,000mg  at night. I encouraged him to continue to keep a close eye on his incision and let us  know if there is any changes.  Should he fail to improve or have worsening symptoms we will likely obtain his x-rays prior to his upcoming 6-week postop follow-up.  he will otherwise follow up with Dr. Clois in 4 weeks with lumbar  xrays prior or sooner should he have any questions or concerns.  He expressed understanding and was in agreement with this plan.  Advised to contact the office if any questions or concerns arise.  Edsel Goods PA-C Department of neurosurgery

## 2024-01-28 ENCOUNTER — Other Ambulatory Visit: Payer: HMO

## 2024-01-28 ENCOUNTER — Telehealth: Payer: Self-pay | Admitting: Neurosurgery

## 2024-01-28 DIAGNOSIS — E291 Testicular hypofunction: Secondary | ICD-10-CM | POA: Diagnosis not present

## 2024-01-28 NOTE — Telephone Encounter (Signed)
 Attempted to call patient. Received message they can't take the call right now, thanks and goodbye.

## 2024-01-28 NOTE — Telephone Encounter (Signed)
 Revision of L5-S1 fusion on 12/30/23   Patient is calling to let our office know that he still has the same amount of fluid on his back that he had at his appointment on 01/14/2024. He would like to know what he needs to do. Please advise.

## 2024-01-29 ENCOUNTER — Other Ambulatory Visit: Payer: Self-pay | Admitting: Urology

## 2024-01-29 ENCOUNTER — Ambulatory Visit: Payer: Self-pay | Admitting: Urology

## 2024-01-29 DIAGNOSIS — E291 Testicular hypofunction: Secondary | ICD-10-CM

## 2024-01-29 LAB — TESTOSTERONE: Testosterone: 953 ng/dL — ABNORMAL HIGH (ref 264–916)

## 2024-01-29 MED ORDER — TESTOSTERONE CYPIONATE 200 MG/ML IM SOLN: 80.0000 mg | INTRAMUSCULAR | Status: AC

## 2024-02-02 ENCOUNTER — Other Ambulatory Visit: Payer: Self-pay

## 2024-02-02 DIAGNOSIS — M47816 Spondylosis without myelopathy or radiculopathy, lumbar region: Secondary | ICD-10-CM

## 2024-02-02 DIAGNOSIS — G8929 Other chronic pain: Secondary | ICD-10-CM

## 2024-02-02 DIAGNOSIS — M5416 Radiculopathy, lumbar region: Secondary | ICD-10-CM

## 2024-02-03 ENCOUNTER — Other Ambulatory Visit: Payer: Self-pay | Admitting: Urology

## 2024-02-03 DIAGNOSIS — E291 Testicular hypofunction: Secondary | ICD-10-CM

## 2024-02-09 ENCOUNTER — Ambulatory Visit
Admission: RE | Admit: 2024-02-09 | Discharge: 2024-02-09 | Disposition: A | Discharge status: Home / Self Care | Source: Ambulatory Visit | Attending: Neurosurgery | Admitting: Neurosurgery

## 2024-02-09 ENCOUNTER — Ambulatory Visit (INDEPENDENT_AMBULATORY_CARE_PROVIDER_SITE_OTHER): Admitting: Neurosurgery

## 2024-02-09 ENCOUNTER — Encounter: Payer: Self-pay | Admitting: Neurosurgery

## 2024-02-09 VITALS — BP 144/84 | Temp 97.8°F | Ht 68.0 in | Wt 236.0 lb

## 2024-02-09 DIAGNOSIS — M5416 Radiculopathy, lumbar region: Secondary | ICD-10-CM

## 2024-02-09 DIAGNOSIS — G8929 Other chronic pain: Secondary | ICD-10-CM | POA: Diagnosis not present

## 2024-02-09 DIAGNOSIS — M5441 Lumbago with sciatica, right side: Secondary | ICD-10-CM | POA: Diagnosis not present

## 2024-02-09 DIAGNOSIS — M5442 Lumbago with sciatica, left side: Secondary | ICD-10-CM | POA: Insufficient documentation

## 2024-02-09 DIAGNOSIS — M545 Low back pain, unspecified: Secondary | ICD-10-CM | POA: Diagnosis not present

## 2024-02-09 DIAGNOSIS — M47816 Spondylosis without myelopathy or radiculopathy, lumbar region: Secondary | ICD-10-CM

## 2024-02-09 DIAGNOSIS — M4316 Spondylolisthesis, lumbar region: Secondary | ICD-10-CM | POA: Diagnosis not present

## 2024-02-09 DIAGNOSIS — G629 Polyneuropathy, unspecified: Secondary | ICD-10-CM

## 2024-02-09 DIAGNOSIS — T84498S Other mechanical complication of other internal orthopedic devices, implants and grafts, sequela: Secondary | ICD-10-CM

## 2024-02-09 DIAGNOSIS — Z09 Encounter for follow-up examination after completed treatment for conditions other than malignant neoplasm: Secondary | ICD-10-CM

## 2024-02-09 MED ORDER — OXYCODONE HCL 5 MG PO TABS: 5.0000 mg | ORAL_TABLET | ORAL | 0 refills | Status: DC | PRN

## 2024-02-09 NOTE — Progress Notes (Signed)
   REFERRING PHYSICIAN:  Steva Clotilda DEL, [address] [place],  [place] [address]  DOS: 12/30/23 L5-S1 fusion. Removal and replacement of S1 and S2 screws.   HISTORY OF PRESENT ILLNESS: [name] is status post above surgery.  His back is doing somewhat better.  He continues to have pain around his right buttock and down the back of his right leg when he stands or walks more than 200 yards.  PHYSICAL EXAMINATION:  General: Patient is well developed, well nourished, calm, collected, and in no apparent distress.   NEUROLOGICAL:  General: In no acute distress.   Awake, alert, oriented to person, place, and time.  Pupils equal round and reactive to light.  Facial tone is symmetric.  Tongue protrusion is midline.  There is no pronator drift.   Strength:            Side Iliopsoas Quads Hamstring PF DF EHL  R 5 5 5 5 5 5   L 5 5 5 5 5 5    Incision c/d/I and healing well. There is a small post-operative seroma.  ROS (Neurologic):  Negative except as noted above  IMAGING: No complications noted  ASSESSMENT/PLAN:  [name] is doing better after the above surgery.   I think he is making progress.  We will see him back in 6 weeks.  We reviewed his activity limitations.  If he continues to have this right lower extremity pain at his next appointment, we will discuss MRI scan of the right hip and consideration of a nerve conduction study.  Based on his peripheral neuropathy, he may be a candidate for spinal cord stimulator.  [name] Department of neurosurgery

## 2024-02-11 ENCOUNTER — Inpatient Hospital Stay: Attending: Internal Medicine

## 2024-02-11 ENCOUNTER — Inpatient Hospital Stay

## 2024-02-11 DIAGNOSIS — D751 Secondary polycythemia: Secondary | ICD-10-CM | POA: Insufficient documentation

## 2024-02-11 LAB — CBC WITH DIFFERENTIAL (CANCER CENTER ONLY)
Abs Immature Granulocytes: 0.01 K/uL (ref 0.00–0.07)
Basophils Absolute: 0.1 K/uL (ref 0.0–0.1)
Basophils Relative: 1 %
Eosinophils Absolute: 0.1 K/uL (ref 0.0–0.5)
Eosinophils Relative: 3 %
HCT: 45.6 % (ref 39.0–52.0)
Hemoglobin: 14.9 g/dL (ref 13.0–17.0)
Immature Granulocytes: 0 %
Lymphocytes Relative: 30 %
Lymphs Abs: 1.4 K/uL (ref 0.7–4.0)
MCH: 29.9 pg (ref 26.0–34.0)
MCHC: 32.7 g/dL (ref 30.0–36.0)
MCV: 91.4 fL (ref 80.0–100.0)
Monocytes Absolute: 0.6 K/uL (ref 0.1–1.0)
Monocytes Relative: 13 %
Neutro Abs: 2.3 K/uL (ref 1.7–7.7)
Neutrophils Relative %: 53 %
Platelet Count: 194 K/uL (ref 150–400)
RBC: 4.99 MIL/uL (ref 4.22–5.81)
RDW: 13.1 % (ref 11.5–15.5)
WBC Count: 4.5 K/uL (ref 4.0–10.5)
nRBC: 0 % (ref 0.0–0.2)

## 2024-02-11 NOTE — Progress Notes (Signed)
 HCT 45.6 today, parameters for phlebotomy per Dr Damaris note is 300 ml if  HCT > 50.  RN spoke with patient, results given and he verbalized understanding. States he has next appointment scheduled.

## 2024-03-11 ENCOUNTER — Other Ambulatory Visit: Payer: Self-pay

## 2024-03-11 DIAGNOSIS — M47816 Spondylosis without myelopathy or radiculopathy, lumbar region: Secondary | ICD-10-CM

## 2024-03-11 DIAGNOSIS — M5416 Radiculopathy, lumbar region: Secondary | ICD-10-CM

## 2024-03-17 ENCOUNTER — Ambulatory Visit: Admitting: Neurosurgery

## 2024-03-17 ENCOUNTER — Ambulatory Visit

## 2024-03-17 VITALS — BP 124/72 | Temp 98.4°F | Ht 68.0 in | Wt 238.5 lb

## 2024-03-17 DIAGNOSIS — M4726 Other spondylosis with radiculopathy, lumbar region: Secondary | ICD-10-CM | POA: Diagnosis not present

## 2024-03-17 DIAGNOSIS — G8929 Other chronic pain: Secondary | ICD-10-CM

## 2024-03-17 DIAGNOSIS — Z981 Arthrodesis status: Secondary | ICD-10-CM | POA: Diagnosis not present

## 2024-03-17 DIAGNOSIS — M5416 Radiculopathy, lumbar region: Secondary | ICD-10-CM

## 2024-03-17 DIAGNOSIS — M4316 Spondylolisthesis, lumbar region: Secondary | ICD-10-CM | POA: Diagnosis not present

## 2024-03-17 DIAGNOSIS — M47816 Spondylosis without myelopathy or radiculopathy, lumbar region: Secondary | ICD-10-CM

## 2024-03-17 DIAGNOSIS — M4727 Other spondylosis with radiculopathy, lumbosacral region: Secondary | ICD-10-CM | POA: Diagnosis not present

## 2024-03-17 DIAGNOSIS — M5442 Lumbago with sciatica, left side: Secondary | ICD-10-CM

## 2024-03-17 DIAGNOSIS — Z4789 Encounter for other orthopedic aftercare: Secondary | ICD-10-CM | POA: Diagnosis not present

## 2024-03-17 NOTE — Progress Notes (Signed)
   REFERRING PHYSICIAN:  Steva Clotilda DEL, [address] [place],  [place] [address]  DOS: 12/30/23 L5-S1 fusion. Removal and replacement of S1 and S2 screws.   HISTORY OF PRESENT ILLNESS:  03/17/24 [name] is a 73 y.o presenting about 2.5 months s/p spinal fusion. Today he reports improvement of his right hip pain.  Of note, despite this improvement he feels like his neuropathy has worsened which is worsening his walking and would like to consider a SCS.   02/09/24 [name] is status post above surgery.  His back is doing somewhat better.  He continues to have pain around his right buttock and down the back of his right leg when he stands or walks more than 200 yards.  PHYSICAL EXAMINATION:  General: Patient is well developed, well nourished, calm, collected, and in no apparent distress.   NEUROLOGICAL:  General: In no acute distress.   Awake, alert, oriented to person, place, and time.  Pupils equal round and reactive to light.  Facial tone is symmetric.  Tongue protrusion is midline.  There is no pronator drift.   Strength:            Side Iliopsoas Quads Hamstring PF DF EHL  R 5 5 5 5 5 5   L 5 5 5 5 5 5    ROS (Neurologic):  Negative except as noted above  IMAGING: 03/17/24 lumbar xrays   ASSESSMENT/PLAN:  [name] is doing better after the above surgery. He can resume activities as tolerated in 2 weeks. I have placed a referral for pain management evaluation for SCS and psych eval. He has thoracic MRI completed already. He would like do have the procedure done by the end of the year if at all possible due to a change in insurance. If he passes eval and does well with the trial he can return to clinic to see Dr. Clois to discuss stimulator placement We will go ahead and schedule his return to clinic in 3 months to see Dr. Clois with lumbar xrays prior.   Edsel Goods PA-C Department of neurosurgery

## 2024-03-23 ENCOUNTER — Telehealth: Payer: Self-pay | Admitting: Neurosurgery

## 2024-03-23 NOTE — Telephone Encounter (Signed)
 Noted. He has a scheduled post-op appointment with Dr Clois in January.

## 2024-03-23 NOTE — Telephone Encounter (Signed)
 Patient states that he would like to hold off on getting the stimulator at this time- would like to discuss this around the beginning of the year.

## 2024-04-04 DIAGNOSIS — Z961 Presence of intraocular lens: Secondary | ICD-10-CM | POA: Diagnosis not present

## 2024-04-04 DIAGNOSIS — H2511 Age-related nuclear cataract, right eye: Secondary | ICD-10-CM | POA: Diagnosis not present

## 2024-05-12 ENCOUNTER — Inpatient Hospital Stay

## 2024-05-12 ENCOUNTER — Inpatient Hospital Stay: Attending: Internal Medicine

## 2024-05-12 DIAGNOSIS — D751 Secondary polycythemia: Secondary | ICD-10-CM | POA: Diagnosis present

## 2024-05-12 LAB — HEMOGLOBIN AND HEMATOCRIT (CANCER CENTER ONLY)
HCT: 50.3 % (ref 39.0–52.0)
Hemoglobin: 17 g/dL (ref 13.0–17.0)

## 2024-05-12 NOTE — Progress Notes (Signed)
 [name] presents today for phlebotomy per MD orders. Phlebotomy procedure started at 1337 and ended at 1346. 300 mls removed. Patient tolerated procedure well. IV needle removed intact.

## 2024-05-12 NOTE — Patient Instructions (Signed)

## 2024-06-13 DIAGNOSIS — G8929 Other chronic pain: Secondary | ICD-10-CM

## 2024-06-21 ENCOUNTER — Encounter: Payer: Self-pay | Admitting: Neurosurgery

## 2024-06-21 ENCOUNTER — Ambulatory Visit

## 2024-06-21 ENCOUNTER — Encounter: Payer: Self-pay | Admitting: Internal Medicine

## 2024-06-21 ENCOUNTER — Ambulatory Visit: Admitting: Neurosurgery

## 2024-06-21 VITALS — BP 126/88 | Ht 68.0 in | Wt 238.0 lb

## 2024-06-21 DIAGNOSIS — Z981 Arthrodesis status: Secondary | ICD-10-CM

## 2024-06-21 DIAGNOSIS — R531 Weakness: Secondary | ICD-10-CM | POA: Diagnosis not present

## 2024-06-21 DIAGNOSIS — R251 Tremor, unspecified: Secondary | ICD-10-CM | POA: Diagnosis not present

## 2024-06-21 DIAGNOSIS — Z09 Encounter for follow-up examination after completed treatment for conditions other than malignant neoplasm: Secondary | ICD-10-CM | POA: Diagnosis not present

## 2024-06-21 DIAGNOSIS — G8929 Other chronic pain: Secondary | ICD-10-CM

## 2024-06-21 NOTE — Progress Notes (Signed)
" ° °  REFERRING PHYSICIAN:  Steva Clotilda DEL, [address] [place],  [place] [address]  DOS: 12/30/23 L5-S1 fusion. Removal and replacement of S1 and S2 screws.   HISTORY OF PRESENT ILLNESS: [name] is status post above surgery.  His back is doing better.   He has started exercising.  He reports trouble with a tremor particularly when he tries to do certain activities.  He is having some shuffling in his gait.   PHYSICAL EXAMINATION:  General: Patient is well developed, well nourished, calm, collected, and in no apparent distress.   NEUROLOGICAL:  General: In no acute distress.   Awake, alert, oriented to person, place, and time.  Pupils equal round and reactive to light.  Facial tone is symmetric.  Tongue protrusion is midline.  There is no pronator drift.   Strength:            Side Iliopsoas Quads Hamstring PF DF EHL  R 5 5 5 5 5 5   L 5 5 5 5 5 5    Incision c/d/I and healing well. There is a small post-operative seroma.  ROS (Neurologic):  Negative except as noted above  IMAGING: No complications noted  ASSESSMENT/PLAN:  [name] is doing better after the above surgery.   I am concerned that he may have Parkinson's disease.  I will send him to one of my colleagues who specializes in movement disorders for a full evaluation.  He may ultimately be a candidate for spinal cord stimulation for his neuropathy.  I will be happy to pursue that if needed.  I will see him back in 6 months with x-rays.  I spent a total of 10 minutes in this patient's care today. This time was spent reviewing pertinent records including imaging studies, obtaining and confirming history, performing a directed evaluation, formulating and discussing my recommendations, and documenting the visit within the medical record.    [name] Department of neurosurgery    "

## 2024-06-24 ENCOUNTER — Encounter: Payer: Self-pay | Admitting: Neurology

## 2024-07-13 ENCOUNTER — Telehealth: Payer: Self-pay

## 2024-07-13 NOTE — Telephone Encounter (Signed)
 Hello [name],   Our office has reached out to this patient a few times on the referral you sent us , however he is refusing services at this time. Please let us  know if the patient needs us  in the future.   Best,  Doyal Fridge

## 2024-07-27 ENCOUNTER — Ambulatory Visit: Payer: HMO

## 2024-07-29 ENCOUNTER — Ambulatory Visit: Payer: HMO | Admitting: Urology

## 2024-08-03 ENCOUNTER — Ambulatory Visit: Admitting: Urology

## 2024-08-10 ENCOUNTER — Other Ambulatory Visit

## 2024-08-10 ENCOUNTER — Ambulatory Visit: Admitting: Internal Medicine

## 2024-08-10 ENCOUNTER — Encounter

## 2024-09-08 ENCOUNTER — Ambulatory Visit: Payer: Self-pay | Admitting: Neurology

## 2024-11-09 ENCOUNTER — Ambulatory Visit: Admitting: Dermatology

## 2024-12-15 ENCOUNTER — Other Ambulatory Visit

## 2024-12-15 ENCOUNTER — Ambulatory Visit
